# Patient Record
Sex: Female | Born: 1955 | Race: White | Hispanic: No | Marital: Married | Smoking: Never smoker
Health system: Southern US, Community
[De-identification: ages and names within clinical notes are randomized; demographics above are authoritative.]

## PROBLEM LIST (undated history)

## (undated) DIAGNOSIS — K449 Diaphragmatic hernia without obstruction or gangrene: Secondary | ICD-10-CM

## (undated) DIAGNOSIS — F411 Generalized anxiety disorder: Secondary | ICD-10-CM

## (undated) DIAGNOSIS — K219 Gastro-esophageal reflux disease without esophagitis: Secondary | ICD-10-CM

## (undated) DIAGNOSIS — I480 Paroxysmal atrial fibrillation: Secondary | ICD-10-CM

## (undated) DIAGNOSIS — N840 Polyp of corpus uteri: Secondary | ICD-10-CM

## (undated) DIAGNOSIS — Z8719 Personal history of other diseases of the digestive system: Secondary | ICD-10-CM

## (undated) DIAGNOSIS — I10 Essential (primary) hypertension: Secondary | ICD-10-CM

## (undated) DIAGNOSIS — E559 Vitamin D deficiency, unspecified: Secondary | ICD-10-CM

## (undated) DIAGNOSIS — R7303 Prediabetes: Secondary | ICD-10-CM

## (undated) DIAGNOSIS — Z9989 Dependence on other enabling machines and devices: Secondary | ICD-10-CM

## (undated) DIAGNOSIS — G4730 Sleep apnea, unspecified: Secondary | ICD-10-CM

## (undated) DIAGNOSIS — T7840XA Allergy, unspecified, initial encounter: Secondary | ICD-10-CM

## (undated) DIAGNOSIS — J309 Allergic rhinitis, unspecified: Secondary | ICD-10-CM

## (undated) DIAGNOSIS — I499 Cardiac arrhythmia, unspecified: Secondary | ICD-10-CM

## (undated) DIAGNOSIS — G932 Benign intracranial hypertension: Secondary | ICD-10-CM

## (undated) DIAGNOSIS — R7301 Impaired fasting glucose: Secondary | ICD-10-CM

## (undated) DIAGNOSIS — K760 Fatty (change of) liver, not elsewhere classified: Secondary | ICD-10-CM

## (undated) DIAGNOSIS — Z7901 Long term (current) use of anticoagulants: Secondary | ICD-10-CM

## (undated) DIAGNOSIS — R112 Nausea with vomiting, unspecified: Secondary | ICD-10-CM

## (undated) DIAGNOSIS — E041 Nontoxic single thyroid nodule: Secondary | ICD-10-CM

## (undated) DIAGNOSIS — J189 Pneumonia, unspecified organism: Secondary | ICD-10-CM

## (undated) DIAGNOSIS — G4733 Obstructive sleep apnea (adult) (pediatric): Secondary | ICD-10-CM

## (undated) DIAGNOSIS — Z9889 Other specified postprocedural states: Secondary | ICD-10-CM

## (undated) DIAGNOSIS — K8050 Calculus of bile duct without cholangitis or cholecystitis without obstruction: Secondary | ICD-10-CM

## (undated) DIAGNOSIS — Z9289 Personal history of other medical treatment: Secondary | ICD-10-CM

## (undated) HISTORY — PX: UPPER GASTROINTESTINAL ENDOSCOPY: SHX188

## (undated) HISTORY — PX: COLONOSCOPY: SHX174

## (undated) HISTORY — DX: Paroxysmal atrial fibrillation: I48.0

## (undated) HISTORY — DX: Sleep apnea, unspecified: G47.30

## (undated) HISTORY — DX: Allergy, unspecified, initial encounter: T78.40XA

## (undated) HISTORY — DX: Gastro-esophageal reflux disease without esophagitis: K21.9

## (undated) HISTORY — PX: TUBAL LIGATION: SHX77

## (undated) HISTORY — PX: OTHER SURGICAL HISTORY: SHX169

## (undated) HISTORY — PX: FRACTURE SURGERY: SHX138

## (undated) HISTORY — DX: Fatty (change of) liver, not elsewhere classified: K76.0

## (undated) HISTORY — PX: SPINE SURGERY: SHX786

## (undated) HISTORY — DX: Diaphragmatic hernia without obstruction or gangrene: K44.9

## (undated) HISTORY — DX: Benign intracranial hypertension: G93.2

## (undated) HISTORY — DX: Generalized anxiety disorder: F41.1

## (undated) HISTORY — DX: Vitamin D deficiency, unspecified: E55.9

## (undated) HISTORY — DX: Essential (primary) hypertension: I10

## (undated) HISTORY — PX: ABDOMINAL HYSTERECTOMY: SHX81

---

## 1998-04-04 ENCOUNTER — Other Ambulatory Visit: Admission: RE | Admit: 1998-04-04 | Discharge: 1998-04-04 | Payer: Self-pay | Admitting: Obstetrics & Gynecology

## 1999-03-04 ENCOUNTER — Ambulatory Visit (HOSPITAL_COMMUNITY): Admission: RE | Admit: 1999-03-04 | Discharge: 1999-03-04 | Payer: Self-pay | Admitting: Obstetrics and Gynecology

## 1999-03-04 ENCOUNTER — Encounter: Payer: Self-pay | Admitting: Obstetrics and Gynecology

## 1999-05-10 ENCOUNTER — Other Ambulatory Visit: Admission: RE | Admit: 1999-05-10 | Discharge: 1999-05-10 | Payer: Self-pay | Admitting: Obstetrics and Gynecology

## 2000-05-11 ENCOUNTER — Other Ambulatory Visit: Admission: RE | Admit: 2000-05-11 | Discharge: 2000-05-11 | Payer: Self-pay | Admitting: Obstetrics and Gynecology

## 2000-05-21 ENCOUNTER — Encounter: Admission: RE | Admit: 2000-05-21 | Discharge: 2000-05-21 | Payer: Self-pay | Admitting: Obstetrics and Gynecology

## 2000-05-21 ENCOUNTER — Encounter: Payer: Self-pay | Admitting: Obstetrics and Gynecology

## 2001-07-02 ENCOUNTER — Other Ambulatory Visit: Admission: RE | Admit: 2001-07-02 | Discharge: 2001-07-02 | Payer: Self-pay | Admitting: Obstetrics and Gynecology

## 2002-06-29 ENCOUNTER — Encounter: Payer: Self-pay | Admitting: Obstetrics and Gynecology

## 2002-06-29 ENCOUNTER — Encounter: Admission: RE | Admit: 2002-06-29 | Discharge: 2002-06-29 | Payer: Self-pay | Admitting: Obstetrics and Gynecology

## 2002-10-18 ENCOUNTER — Other Ambulatory Visit: Admission: RE | Admit: 2002-10-18 | Discharge: 2002-10-18 | Payer: Self-pay | Admitting: Obstetrics and Gynecology

## 2003-10-25 ENCOUNTER — Encounter: Admission: RE | Admit: 2003-10-25 | Discharge: 2003-10-25 | Payer: Self-pay | Admitting: Obstetrics and Gynecology

## 2003-10-26 ENCOUNTER — Other Ambulatory Visit: Admission: RE | Admit: 2003-10-26 | Discharge: 2003-10-26 | Payer: Self-pay | Admitting: Obstetrics and Gynecology

## 2004-07-08 ENCOUNTER — Ambulatory Visit: Payer: Self-pay | Admitting: Endocrinology

## 2004-07-10 DIAGNOSIS — K219 Gastro-esophageal reflux disease without esophagitis: Secondary | ICD-10-CM

## 2004-07-10 HISTORY — DX: Gastro-esophageal reflux disease without esophagitis: K21.9

## 2004-07-12 ENCOUNTER — Ambulatory Visit: Payer: Self-pay | Admitting: Internal Medicine

## 2004-07-12 ENCOUNTER — Ambulatory Visit (HOSPITAL_COMMUNITY): Admission: RE | Admit: 2004-07-12 | Discharge: 2004-07-12 | Payer: Self-pay | Admitting: Internal Medicine

## 2004-10-28 ENCOUNTER — Encounter: Admission: RE | Admit: 2004-10-28 | Discharge: 2004-10-28 | Payer: Self-pay | Admitting: Obstetrics and Gynecology

## 2005-01-23 ENCOUNTER — Other Ambulatory Visit: Admission: RE | Admit: 2005-01-23 | Discharge: 2005-01-23 | Payer: Self-pay | Admitting: Obstetrics and Gynecology

## 2005-05-12 DIAGNOSIS — G932 Benign intracranial hypertension: Secondary | ICD-10-CM

## 2005-05-12 HISTORY — PX: OTHER SURGICAL HISTORY: SHX169

## 2005-05-12 HISTORY — DX: Benign intracranial hypertension: G93.2

## 2005-10-30 ENCOUNTER — Encounter: Admission: RE | Admit: 2005-10-30 | Discharge: 2005-10-30 | Payer: Self-pay | Admitting: Obstetrics and Gynecology

## 2005-11-11 ENCOUNTER — Encounter: Admission: RE | Admit: 2005-11-11 | Discharge: 2005-11-11 | Payer: Self-pay | Admitting: Obstetrics and Gynecology

## 2006-02-26 ENCOUNTER — Other Ambulatory Visit: Admission: RE | Admit: 2006-02-26 | Discharge: 2006-02-26 | Payer: Self-pay | Admitting: Obstetrics and Gynecology

## 2007-06-21 ENCOUNTER — Ambulatory Visit: Payer: Self-pay | Admitting: Internal Medicine

## 2007-06-23 ENCOUNTER — Encounter: Admission: RE | Admit: 2007-06-23 | Discharge: 2007-06-23 | Payer: Self-pay | Admitting: Internal Medicine

## 2007-06-23 DIAGNOSIS — K760 Fatty (change of) liver, not elsewhere classified: Secondary | ICD-10-CM

## 2007-06-23 HISTORY — DX: Fatty (change of) liver, not elsewhere classified: K76.0

## 2007-10-22 ENCOUNTER — Encounter: Admission: RE | Admit: 2007-10-22 | Discharge: 2007-10-22 | Payer: Self-pay | Admitting: Obstetrics and Gynecology

## 2008-05-12 HISTORY — PX: DILATION AND CURETTAGE OF UTERUS: SHX78

## 2008-06-30 ENCOUNTER — Ambulatory Visit (HOSPITAL_COMMUNITY): Admission: RE | Admit: 2008-06-30 | Discharge: 2008-06-30 | Payer: Self-pay | Admitting: Obstetrics and Gynecology

## 2008-06-30 ENCOUNTER — Encounter (INDEPENDENT_AMBULATORY_CARE_PROVIDER_SITE_OTHER): Payer: Self-pay | Admitting: Obstetrics and Gynecology

## 2008-09-27 ENCOUNTER — Ambulatory Visit: Payer: Self-pay | Admitting: Internal Medicine

## 2008-10-03 ENCOUNTER — Ambulatory Visit: Payer: Self-pay | Admitting: Family Medicine

## 2008-10-03 DIAGNOSIS — J309 Allergic rhinitis, unspecified: Secondary | ICD-10-CM

## 2008-10-03 LAB — CONVERTED CEMR LAB: Rapid Strep: NEGATIVE

## 2008-10-10 ENCOUNTER — Ambulatory Visit: Payer: Self-pay | Admitting: Internal Medicine

## 2008-10-10 LAB — HM COLONOSCOPY: HM Colonoscopy: NORMAL

## 2008-10-23 ENCOUNTER — Encounter: Admission: RE | Admit: 2008-10-23 | Discharge: 2008-10-23 | Payer: Self-pay | Admitting: Obstetrics and Gynecology

## 2008-11-24 ENCOUNTER — Ambulatory Visit: Payer: Self-pay | Admitting: Family Medicine

## 2008-12-08 ENCOUNTER — Encounter: Payer: Self-pay | Admitting: Family Medicine

## 2008-12-08 ENCOUNTER — Encounter: Payer: Self-pay | Admitting: Internal Medicine

## 2009-02-12 ENCOUNTER — Telehealth (INDEPENDENT_AMBULATORY_CARE_PROVIDER_SITE_OTHER): Payer: Self-pay | Admitting: *Deleted

## 2009-02-15 ENCOUNTER — Telehealth (INDEPENDENT_AMBULATORY_CARE_PROVIDER_SITE_OTHER): Payer: Self-pay | Admitting: *Deleted

## 2009-03-06 ENCOUNTER — Ambulatory Visit: Payer: Self-pay | Admitting: Internal Medicine

## 2009-03-06 DIAGNOSIS — J0100 Acute maxillary sinusitis, unspecified: Secondary | ICD-10-CM

## 2009-04-02 ENCOUNTER — Telehealth: Payer: Self-pay | Admitting: Family

## 2009-08-30 ENCOUNTER — Telehealth: Payer: Self-pay | Admitting: Family

## 2009-09-11 ENCOUNTER — Telehealth: Payer: Self-pay | Admitting: Family

## 2009-10-05 ENCOUNTER — Ambulatory Visit: Payer: Self-pay | Admitting: Internal Medicine

## 2009-10-05 DIAGNOSIS — F419 Anxiety disorder, unspecified: Secondary | ICD-10-CM | POA: Insufficient documentation

## 2009-10-05 DIAGNOSIS — F411 Generalized anxiety disorder: Secondary | ICD-10-CM

## 2009-10-05 DIAGNOSIS — K219 Gastro-esophageal reflux disease without esophagitis: Secondary | ICD-10-CM

## 2009-10-10 LAB — HM MAMMOGRAPHY

## 2009-10-24 ENCOUNTER — Encounter: Admission: RE | Admit: 2009-10-24 | Discharge: 2009-10-24 | Payer: Self-pay | Admitting: Obstetrics and Gynecology

## 2009-12-10 ENCOUNTER — Ambulatory Visit: Payer: Self-pay | Admitting: Internal Medicine

## 2009-12-10 DIAGNOSIS — R7301 Impaired fasting glucose: Secondary | ICD-10-CM

## 2009-12-10 DIAGNOSIS — R7303 Prediabetes: Secondary | ICD-10-CM | POA: Insufficient documentation

## 2009-12-10 LAB — CONVERTED CEMR LAB
ALT: 30 units/L (ref 0–35)
Basophils Absolute: 0 10*3/uL (ref 0.0–0.1)
CO2: 30 meq/L (ref 19–32)
Chloride: 106 meq/L (ref 96–112)
Cholesterol: 179 mg/dL (ref 0–200)
Eosinophils Relative: 4.1 % (ref 0.0–5.0)
GFR calc non Af Amer: 80.97 mL/min (ref >60)
Hemoglobin, Urine: NEGATIVE
Hemoglobin: 14.3 g/dL (ref 12.0–15.0)
Ketones, ur: NEGATIVE mg/dL
LDL Cholesterol: 124 mg/dL — ABNORMAL HIGH (ref 0–99)
Leukocytes, UA: NEGATIVE
MCHC: 34.8 g/dL (ref 30.0–36.0)
MCV: 83.7 fL (ref 78.0–100.0)
Monocytes Absolute: 0.6 10*3/uL (ref 0.1–1.0)
Neutrophils Relative %: 59 % (ref 43.0–77.0)
Potassium: 4.7 meq/L (ref 3.5–5.1)
TSH: 1.28 microintl units/mL (ref 0.35–5.50)
Total Bilirubin: 0.9 mg/dL (ref 0.3–1.2)
Total CHOL/HDL Ratio: 4
Total Protein, Urine: NEGATIVE mg/dL
VLDL: 14.2 mg/dL (ref 0.0–40.0)
WBC: 6 10*3/uL (ref 4.5–10.5)

## 2009-12-14 ENCOUNTER — Encounter: Payer: Self-pay | Admitting: Internal Medicine

## 2009-12-14 ENCOUNTER — Ambulatory Visit: Payer: Self-pay | Admitting: Internal Medicine

## 2010-06-02 ENCOUNTER — Encounter: Payer: Self-pay | Admitting: Obstetrics and Gynecology

## 2010-06-03 ENCOUNTER — Encounter: Payer: Self-pay | Admitting: Obstetrics and Gynecology

## 2010-06-11 NOTE — Progress Notes (Signed)
Summary: Aciphex samples  Phone Note Call from Patient Call back at Work Phone [phone]   Caller: Patient Call For: Lemont Fillers FNP Summary of Call: patient called and left voice message stating she was given a rx for Nexium and was given a powder mix pack that is not working for her. She is requesting samples of Nexium to get her through to the next month or would like to know if there is something esle she could do Initial call taken by: Glendell Docker CMA,  Sep 11, 2009 9:56 AM  Follow-up for Phone Call        Patient has not been seen since October.  She needs to schedule OV for evaluation of her GI complaints please. Follow-up by: Lemont Fillers FNP,  Sep 11, 2009 10:02 AM  Additional Follow-up for Phone Call Additional follow up Details #1::        Apparently the Nexium pack/mix was called in instead of capsules. Pt. says the mix is slimmey and she can't get it down.  The pharmacy will not take the med back. Can we give her samples of Prilosec OTC or Aciphex since we do not have any Nexium samples? Pt states she will have to call back to schedule an appt. and voices understanding that we will not be able to give refills on meds until she is seen.  Please advise.   Mervin Kung CMA  Sep 11, 2009 10:25 AM     Additional Follow-up for Phone Call Additional follow up Details #2::    I will leave samples for her (aciphex 20mg  by mouth daily) #30 lot [id number] exp 2012.  If no improvement however, she needs to be seen. Follow-up by: Lemont Fillers FNP,  Sep 11, 2009 10:58 AM  Additional Follow-up for Phone Call Additional follow up Details #3:: Details for Additional Follow-up Action Taken: Pt advised she could pick up samples of Aciphex. Pt states she is unable to get by during the hours of 8-5pm.  I called to the ER and they said we could leave samples at the registration desk for pt. Pt advised she can pick samples up in the ER.  Mervin Kung CMA  Sep 11, 2009 11:36 AM   New/Updated Medications: ACIPHEX 20 MG TBEC (RABEPRAZOLE SODIUM) one tablet by mouth daily

## 2010-06-11 NOTE — Progress Notes (Signed)
Summary: Nexium Refill  Phone Note Refill Request Message from:  Patient on August 30, 2009 8:53 AM  Refills Requested: Medication #1:  NEXIUM 40 MG PACK 1 by mouth daily  Method Requested: Electronic Initial call taken by: Lannette Donath,  August 30, 2009 8:54 AM  Follow-up for Phone Call        OK to give refill. It looks like patient is due for CPX.  Pls call patient and arrange.   Follow-up by: Lemont Fillers FNP,  August 30, 2009 11:31 AM  Additional Follow-up for Phone Call Additional follow up Details #1::        call placed to patient at [phone], no answer, detailed voice message left for patient informing her CPX due, please  call to schedule Additional Follow-up by: Glendell Docker CMA,  August 30, 2009 11:37 AM    Prescriptions: NEXIUM 40 MG PACK (ESOMEPRAZOLE MAGNESIUM) 1 by mouth daily  #30 x 2   Entered by:   Glendell Docker CMA   Authorized by:   Lemont Fillers FNP   Signed by:   Glendell Docker CMA on 08/30/2009   Method used:   Electronically to        [facility]. [phone]* (retail)       [address]       [place], [place]  [address]       Ph: [phone] or [phone]       Fax: [phone]   RxID:   [id number]

## 2010-06-11 NOTE — Assessment & Plan Note (Signed)
Summary: NEW/ [employer]/ TRANSFER FROM MELISSA O'SULLIVAN/NWS  #   Vital Signs:  Patient profile:   55 year old female Height:      66 inches (167.64 cm) Weight:      241.12 pounds (109.60 kg) O2 Sat:      97 % on Room air Temp:     97.8 degrees F (36.56 degrees C) oral Pulse rate:   75 / minute BP sitting:   110 / 92  (left arm) Cuff size:   large  Vitals Entered By: Orlan Leavens (Oct 05, 2009 3:02 PM)  O2 Flow:  Room air CC: New patient Is Patient Diabetic? No Pain Assessment Patient in pain? no        Primary Care Provider:  Newt Lukes MD  CC:  New patient.  History of Present Illness: new pt to me, transfer from another office due to conveince of office location -   1) GERD - hx food impaction 2006 - takes PPI and reports compliance with ongoing medical treatment. no changes in medication dose or frequency - prefers aciphex to nexium. denies adverse side effects related to current therapy.   2) allergic rhinitis - seasonal, reports compliance with ongoing medical treatment and no changes in medication dose or frequency. denies adverse side effects related to current therapy.   3) c/o anxiety and "hot flash symptoms" - menoause > 5 yrs -  describes as flushed feeling and nervousness with speaking or public presentations - prior rx for xanax helpful - but expired - no weight loss, bowel changes, palpiations - no postmenopauseal bleeding  Preventive Screening-Counseling & Management  Alcohol-Tobacco     Smoking Status: never     Tobacco Counseling: not indicated; no tobacco use  Caffeine-Diet-Exercise     Diet Counseling: to improve diet; diet is suboptimal     Nutrition Referrals: no     Does Patient Exercise: no     Exercise Counseling: not indicated; exercise is adequate     Depression Counseling: not indicated; screening negative for depression  Safety-Violence-Falls     Seat Belt Counseling: not indicated; patient wears seat belts     Helmet Counseling:  not indicated; patient wears helmet when riding bicycle/motocycle     Firearms in the Home: no firearms in the home     Firearm Counseling: not applicable     Smoke Detectors: yes     Smoke Detector Counseling: no     Violence Counseling: not indicated; no violence risk noted     Fall Risk Counseling: not indicated; no significant falls noted  Clinical Review Panels:  Prevention   Last Mammogram:  normal (10/23/2008)   Last Pap Smear:  normal (01/02/2009)   Last Colonoscopy:  Location:  [facility].  (10/10/2008)  Immunizations   Last Flu Vaccine:  Fluvax 3+ (01/23/2009)   Current Medications (verified): 1)  Aciphex 20 Mg Tbec (Rabeprazole Sodium) .... One Tablet By Mouth Daily 2)  One-A-Day Menopause Health  Tabs (Specialty Vitamins Products) .... Take 1 By Mouth Once Daily 3)  Wal-Zyr 10 Mg Tabs (Cetirizine Hcl) .... Take 1 By Mouth Once Daily  Allergies: 1)  ! Sulfa 2)  ! Penicillin 3)  ! Codeine 4)  ! Ceclor 5)  ! Levaquin 6)  ! * Hydrocodone Syrup  Past History:  Past Medical History: GERD -- hx food impaction 07/2004 G2P2, menopause at 48. hx of pseudotumor cerebri in 2001. shingles - 2000 migraines   allergic rhinitis  MD rooster: GI -  [facility] - richardson @ [facility]  Past Surgical History: D&C 2010 BTL 1995    Family History: Family History Breast cancer 1st degree relative <50 (mom)  Social History: Occupation: Insurance account manager, social services Married, lives with spouse 2 daughters -79, 84 Never Smoked Smoking Status:  never Does Patient Exercise:  no  Review of Systems  The patient denies fever, weight loss, chest pain, syncope, headaches, and abdominal pain.    Physical Exam  General:  overweight-appearing.  alert, well-developed, well-nourished, and cooperative to examination.    Eyes:  vision grossly intact; pupils equal, round and reactive to light.  conjunctiva and lids normal.    Lungs:  normal respiratory effort, no  intercostal retractions or use of accessory muscles; normal breath sounds bilaterally - no crackles and no wheezes.    Heart:  normal rate, regular rhythm, no murmur, and no rub. BLE without edema. normal DP pulses and normal cap refill in all 4 extremities    Psych:  Oriented X3, memory intact for recent and remote, normally interactive, good eye contact, mildly anxious appearing, not depressed appearing, and not agitated.      Impression & Recommendations:  Problem # 1:  ANXIETY (ICD-300.00)  start trial low dose snri to help with "hot flash" symptoms and ok to use low dose bz sparingly if needed  rx for each done - f/u same symptoms soon (to schedule for CPX) Her updated medication list for this problem includes:    Venlafaxine Hcl 37.5 Mg Xr24h-cap (Venlafaxine hcl) .Marland Kitchen... 1 by mouth every day    Alprazolam 0.5 Mg Tabs (Alprazolam) .Marland Kitchen... 1 by mouth every 12hours as needed for anxiety  Discussed medication use and relaxation techniques.   Orders: Prescription Created Electronically [phone])  Problem # 2:  GERD (ICD-530.81) hx food impaction, daily symptoms improved with aciphex>nexium - cont same Her updated medication list for this problem includes:    Aciphex 20 Mg Tbec (Rabeprazole sodium) ..... One tablet by mouth daily  EGD: Location: [facility]  Findings: Normal   (07/12/2004)  Problem # 3:  ALLERGIC RHINITIS (ICD-477.9) Assessment: Improved  The following medications were removed from the medication list:    Nasonex 50 Mcg/act Susp (Mometasone furoate) .Marland Kitchen... 2 sprays per nostril daily    Zyrtec Allergy 10 Mg Tabs (Cetirizine hcl) .Marland Kitchen... 1 tab by mouth qpm Her updated medication list for this problem includes:    Wal-zyr 10 Mg Tabs (Cetirizine hcl) .Marland Kitchen... Take 1 by mouth once daily  Discussed use of allergy medications and environmental measures.   Complete Medication List: 1)  Aciphex 20 Mg Tbec (Rabeprazole sodium) .... One tablet by mouth daily 2)   One-a-day Menopause Health Tabs (Specialty vitamins products) .... Take 1 by mouth once daily 3)  Wal-zyr 10 Mg Tabs (Cetirizine hcl) .... Take 1 by mouth once daily 4)  Venlafaxine Hcl 37.5 Mg Xr24h-cap (Venlafaxine hcl) .Marland Kitchen.. 1 by mouth every day 5)  Alprazolam 0.5 Mg Tabs (Alprazolam) .Marland Kitchen.. 1 by mouth every 12hours as needed for anxiety  Patient Instructions: 1)  it was good to see you today. 2)  start generic effexor for anxiety and hot flash symptoms - your prescription has been electronically submitted to your pharmacy. Please take as directed. Contact our office if you believe you're having problems with the medication(s).  3)  generic xanax also provided to use as needed as discussed 4)  Take calcium +Vitamin D daily. 5)  refill on aciphex - 6)  Please schedule a follow-up  appointment for medical physical and labs as discussed, sooner if problems.  Prescriptions: ALPRAZOLAM 0.5 MG TABS (ALPRAZOLAM) 1 by mouth every 12hours as needed for anxiety  #30 x 1   Entered and Authorized by:   Newt Lukes MD   Signed by:   Newt Lukes MD on 10/05/2009   Method used:   Print then Give to Patient   RxID:   [id number] ACIPHEX 20 MG TBEC (RABEPRAZOLE SODIUM) one tablet by mouth daily  #30 x 5   Entered and Authorized by:   Newt Lukes MD   Signed by:   Newt Lukes MD on 10/05/2009   Method used:   Electronically to        [facility]. [phone]* (retail)       [address]       [place], [place]  [address]       Ph: [phone] or [phone]       Fax: [phone]   RxID:   [id number] VENLAFAXINE HCL 37.5 MG XR24H-CAP (VENLAFAXINE HCL) 1 by mouth every day  #30 x 3   Entered and Authorized by:   Newt Lukes MD   Signed by:   Newt Lukes MD on 10/05/2009   Method used:   Electronically to        [facility]. [phone]* (retail)       [address]       [place], [place]  [address]       Ph: [phone] or [phone]       Fax: [phone]   RxID:   [id number]    EGD  Procedure date:  07/12/2004  Findings:      Location: [facility]  Findings: Normal

## 2010-06-11 NOTE — Assessment & Plan Note (Signed)
Summary: CPX / NWS / #   Vital Signs:  Patient profile:   55 year old female Height:      66 inches (167.64 cm) Weight:      242.4 pounds (110.18 kg) BMI:     39.27 O2 Sat:      97 % on Room air Temp:     97.9 degrees F (36.61 degrees C) oral Pulse rate:   63 / minute BP sitting:   134 / 92  (left arm) Cuff size:   large  Vitals Entered By: Orlan Leavens RMA (December 14, 2009 9:42 AM)  O2 Flow:  Room air CC: CPX Is Patient Diabetic? No Pain Assessment Patient in pain? no        Primary Care Provider:  Newt Lukes MD  CC:  CPX.  History of Present Illness: patient is here today for annual physical. Patient feels well and has no complaints.   reviewed chronic med issues  1) GERD - hx food impaction 2006 - takes PPI and reports compliance with ongoing medical treatment. no changes in medication dose or frequency - prefers aciphex to nexium. denies adverse side effects related to current therapy.   2) allergic rhinitis - seasonal, reports compliance with ongoing medical treatment and no changes in medication dose or frequency. denies adverse side effects related to current therapy.  increase in sinus pressure with weather changes - but no discharge, pain or fever  3) c/o anxiety and "hot flash symptoms" - menoause > 5 yrs -  describes as flushed feeling and nervousness with speaking or public presentations - rx for xanax helpful (not needing to take it, just "know that it is there" -started venlafax 09/2009 - seems improved but "not all better yet"  Preventive Screening-Counseling & Management  Alcohol-Tobacco     Smoking Status: never     Tobacco Counseling: not indicated; no tobacco use  Caffeine-Diet-Exercise     Diet Counseling: to improve diet; diet is suboptimal     Nutrition Referrals: no     Does Patient Exercise: no     Exercise Counseling: not indicated; exercise is adequate     Depression Counseling: not indicated; screening negative for  depression  Safety-Violence-Falls     Seat Belt Counseling: not indicated; patient wears seat belts     Helmet Counseling: not indicated; patient wears helmet when riding bicycle/motocycle     Firearms in the Home: no firearms in the home     Firearm Counseling: not applicable     Smoke Detectors: yes     Smoke Detector Counseling: no     Violence Counseling: not indicated; no violence risk noted     Fall Risk Counseling: not indicated; no significant falls noted  Clinical Review Panels:  Prevention   Last Mammogram:  No specific mammographic evidence of malignancy.   (10/10/2009)   Last Pap Smear:  normal (01/02/2009)   Last Colonoscopy:  Location:  [facility].  (10/10/2008)  Immunizations   Last Tetanus Booster:  Tdap (12/14/2009)   Last Flu Vaccine:  Fluvax 3+ (01/23/2009)  Lipid Management   Cholesterol:  179 (12/10/2009)   LDL (bad choesterol):  124 (12/10/2009)   HDL (good cholesterol):  40.40 (12/10/2009)  CBC   WBC:  6.0 (12/10/2009)   RBC:  4.92 (12/10/2009)   Hgb:  14.3 (12/10/2009)   Hct:  41.2 (12/10/2009)   Platelets:  258.0 (12/10/2009)   MCV  83.7 (12/10/2009)   MCHC  34.8 (12/10/2009)   RDW  14.1 (12/10/2009)   PMN:  59.0 (12/10/2009)   Lymphs:  26.4 (12/10/2009)   Monos:  9.7 (12/10/2009)   Eosinophils:  4.1 (12/10/2009)   Basophil:  0.8 (12/10/2009)  Complete Metabolic Panel   Glucose:  114 (12/10/2009)   Sodium:  141 (12/10/2009)   Potassium:  4.7 (12/10/2009)   Chloride:  106 (12/10/2009)   CO2:  30 (12/10/2009)   BUN:  17 (12/10/2009)   Creatinine:  0.8 (12/10/2009)   Albumin:  3.7 (12/10/2009)   Total Protein:  6.8 (12/10/2009)   Calcium:  9.3 (12/10/2009)   Total Bili:  0.9 (12/10/2009)   Alk Phos:  71 (12/10/2009)   SGPT (ALT):  30 (12/10/2009)   SGOT (AST):  20 (12/10/2009)   Current Medications (verified): 1)  Aciphex 20 Mg Tbec (Rabeprazole Sodium) .... One Tablet By Mouth Daily 2)  One-A-Day Menopause Health   Tabs (Specialty Vitamins Products) .... Take 1 By Mouth Once Daily 3)  Wal-Zyr 10 Mg Tabs (Cetirizine Hcl) .... Take 1 By Mouth Once Daily 4)  Venlafaxine Hcl 37.5 Mg Xr24h-Cap (Venlafaxine Hcl) .Marland Kitchen.. 1 By Mouth Every Day 5)  Alprazolam 0.5 Mg Tabs (Alprazolam) .Marland Kitchen.. 1 By Mouth Every 12hours As Needed For Anxiety  Allergies (verified): 1)  ! Sulfa 2)  ! Penicillin 3)  ! Codeine 4)  ! Ceclor 5)  ! Levaquin 6)  ! * Hydrocodone Syrup  Past History:  Past Medical History: GERD -- hx food impaction 07/2004 G2P2, menopause at 48. hx of pseudotumor cerebri in 2001. shingles - 2000 migraines   allergic rhinitis  MD roster: GI - brodie gyn - richardson @ [facility]  Past Surgical History: D&C 2010 BTL 1995  Family History: Family History Breast cancer 1st degree relative <50 (mom)  Social History: Occupation: Insurance account manager, social services Married, lives with spouse 2 daughters -71, 58 Never Smoked  Review of Systems       see HPI above. I have reviewed all other systems and they were negative.   Physical Exam  General:  overweight-appearing.  alert, well-developed, well-nourished, and cooperative to examination.    Eyes:  vision grossly intact; pupils equal, round and reactive to light.  conjunctiva and lids normal.    Ears:  scarring on both TMs R>L,normal pinnae bilaterally, without erythema, swelling, or tenderness to palpation. TMs with clear effusion, no cerumen impaction. Hearing grossly normal bilaterally  Mouth:  2+ tonsills without erythema or exudate -good dentition and pharynx pink and moist.   Neck:  supple, full ROM, no masses, no thyromegaly; no thyroid nodules or tenderness. no JVD or carotid bruits.   Lungs:  normal respiratory effort, no intercostal retractions or use of accessory muscles; normal breath sounds bilaterally - no crackles and no wheezes.    Heart:  normal rate, regular rhythm, no murmur, and no rub. BLE without edema. normal DP pulses and normal cap  refill in all 4 extremities    Abdomen:  soft, non-tender, normal bowel sounds, no distention; no masses and no appreciable hepatomegaly or splenomegaly.   Genitalia:  defer to gyn Msk:  No deformity or scoliosis noted of thoracic or lumbar spine.   Neurologic:  alert & oriented X3 and cranial nerves II-XII symetrically intact.  strength normal in all extremities, sensation intact to light touch, and gait normal. speech fluent without dysarthria or aphasia; follows commands with good comprehension.  Skin:  no rashes, vesicles, ulcers, or erythema. No nodules or irregularity to palpation.  Psych:  Oriented X3, memory intact for recent  and remote, normally interactive, good eye contact, not anxious appearing, not depressed appearing, and not agitated.      Impression & Recommendations:  Problem # 1:  PHYSICAL EXAMINATION (ICD-V70.0) Patient has been counseled on age-appropriate routine health concerns for screening and prevention. These are reviewed and up-to-date. Immunizations are up-to-date or declined. Labs and ECG reviewed.  Orders: EKG w/ Interpretation (93000)  Problem # 2:  ALLERGIC RHINITIS (ICD-477.9)  add nasal steroid for sinus pressure - hope to prevent recurrent sinus infx this fall educated on use of nasal steroid and mech of action Her updated medication list for this problem includes:    Wal-zyr 10 Mg Tabs (Cetirizine hcl) .Marland Kitchen... Take 1 by mouth once daily    Veramyst 27.5 Mcg/spray Susp (Fluticasone furoate) .Marland Kitchen... 1 spray each nostril  every morning  Orders: Prescription Created Electronically [phone])  Problem # 3:  ANXIETY (ICD-300.00)  Her updated medication list for this problem includes:    Venlafaxine Hcl 75 Mg Xr24h-cap (Venlafaxine hcl) .Marland Kitchen... 1 by mouth once daily    Alprazolam 0.5 Mg Tabs (Alprazolam) .Marland Kitchen... 1 by mouth every 12hours as needed for anxiety  symptoms improved on low dose snri to help with "hot flash" symptoms and ok to use low dose bz sparingly if  needed  titrate up dose now for inc effect on symptoms control -  fup 6 mnths to reasses need for med and poss titrate off, pt to call sooner if problems  Orders: Prescription Created Electronically [phone])  Complete Medication List: 1)  Aciphex 20 Mg Tbec (Rabeprazole sodium) .... One tablet by mouth daily 2)  One-a-day Menopause Health Tabs (Specialty vitamins products) .... Take 1 by mouth once daily 3)  Wal-zyr 10 Mg Tabs (Cetirizine hcl) .... Take 1 by mouth once daily 4)  Venlafaxine Hcl 75 Mg Xr24h-cap (Venlafaxine hcl) .Marland Kitchen.. 1 by mouth once daily 5)  Alprazolam 0.5 Mg Tabs (Alprazolam) .Marland Kitchen.. 1 by mouth every 12hours as needed for anxiety 6)  Veramyst 27.5 Mcg/spray Susp (Fluticasone furoate) .Marland Kitchen.. 1 spray each nostril  every morning  Other Orders: Tdap => 74yrs IM (09811) Admin 1st Vaccine (91478)  Patient Instructions: 1)  it was good to see you today. 2)  exam and EKG look great - labs reviewed (also look good!) 3)  increase dose venlafaxine as discussed 4)  start nasal steroid spray for sinus and allergy symptoms - veramyst 5)  your prescriptions have been electronically submitted to your pharmacy. Please take as directed. Contact our office if you believe you're having problems with the medication(s).  6)  Please schedule a follow-up appointment in 6 months to review anxiety and other issues, call sooner if problems.  Prescriptions: VERAMYST 27.5 MCG/SPRAY SUSP (FLUTICASONE FUROATE) 1 spray each nostril  every morning  #1 x 5   Entered and Authorized by:   Newt Lukes MD   Signed by:   Newt Lukes MD on 12/14/2009   Method used:   Electronically to        [facility]. [phone]* (retail)       [address]       [place], [place]  [address]       Ph: [phone] or [phone]       Fax: [phone]   RxID:   [id number] VENLAFAXINE HCL 75 MG XR24H-CAP (VENLAFAXINE HCL) 1 by mouth once daily  #30 x 6   Entered and  Authorized by:   Vikki Ports  Edsel Petrin MD   Signed by:   Newt Lukes MD on 12/14/2009   Method used:   Electronically to        [facility]. [phone]* (retail)       [address]       [place], [place]  [address]       Ph: [phone] or [phone]       Fax: [phone]   RxID:   [id number]    Mammogram  Procedure date:  10/10/2009  Findings:      No specific mammographic evidence of malignancy.      Immunizations Administered:  Tetanus Vaccine:    Vaccine Type: Tdap    Site: left deltoid    Mfr: Gl[employer]oSmithKline    Dose: 0.5 ml    Route: IM    Given by: Orlan Leavens RMA    Exp. Date: 11/09/2011    Lot #: [id number]    VIS given: 12/14/09

## 2010-06-21 ENCOUNTER — Encounter: Payer: Self-pay | Admitting: Internal Medicine

## 2010-06-21 ENCOUNTER — Ambulatory Visit (INDEPENDENT_AMBULATORY_CARE_PROVIDER_SITE_OTHER): Payer: Managed Care, Other (non HMO) | Admitting: Internal Medicine

## 2010-06-21 DIAGNOSIS — I10 Essential (primary) hypertension: Secondary | ICD-10-CM | POA: Insufficient documentation

## 2010-06-21 DIAGNOSIS — F411 Generalized anxiety disorder: Secondary | ICD-10-CM

## 2010-06-21 DIAGNOSIS — J0100 Acute maxillary sinusitis, unspecified: Secondary | ICD-10-CM

## 2010-06-27 NOTE — Assessment & Plan Note (Signed)
Summary: 6 MTH ROV NWS #   Vital Signs:  Patient profile:   55 year old female Height:      66 inches (167.64 cm) Weight:      243.4 pounds (110.64 kg) O2 Sat:      97 % on Room air Temp:     98.5 degrees F (36.94 degrees C) oral Pulse rate:   651 / minute BP sitting:   140 / 88  (left arm) Cuff size:   large  Vitals Entered By: Orlan Leavens RMA (June 21, 2010 4:31 PM)  O2 Flow:  Room air CC: 6 month follow-up Is Patient Diabetic? No Pain Assessment Patient in pain? no      Comments ? sinus infection also, and want to discuss BP she states by her machine been running higher   Primary Care Provider:  Newt Lukes MD  CC:  6 month follow-up.  History of Present Illness: here for f/u -  1) GERD - hx food impaction 2006 - takes PPI and reports compliance with ongoing medical treatment. no changes in medication dose or frequency - prefers aciphex to nexium. denies adverse side effects related to current therapy.   2) allergic rhinitis - seasonal, reports compliance with ongoing medical treatment and no changes in medication dose or frequency. denies adverse side effects related to current therapy.  increase in sinus pressure with weather changes - located over left max region with nasal discharge, mild pain but no fever  3) c/o anxiety and "hot flash symptoms" - menopause > 5 yrs -  describes as flushed feeling and nervousness with speaking or public presentations - rx for xanax helpful (not needing to take it, just "know that it is there" -started venlafax 09/2009, inc dose 12/2009 - seems improved  4) elev BP - never on meds for HTN - FH same - no HA or edema - will to start med tx  Clinical Review Panels:  Immunizations   Last Tetanus Booster:  Tdap (12/14/2009)   Last Flu Vaccine:  Fluvax 3+ (01/23/2009)  Lipid Management   Cholesterol:  179 (12/10/2009)   LDL (bad choesterol):  124 (12/10/2009)   HDL (good cholesterol):  40.40 (12/10/2009)  CBC   WBC:  6.0  (12/10/2009)   RBC:  4.92 (12/10/2009)   Hgb:  14.3 (12/10/2009)   Hct:  41.2 (12/10/2009)   Platelets:  258.0 (12/10/2009)   MCV  83.7 (12/10/2009)   MCHC  34.8 (12/10/2009)   RDW  14.1 (12/10/2009)   PMN:  59.0 (12/10/2009)   Lymphs:  26.4 (12/10/2009)   Monos:  9.7 (12/10/2009)   Eosinophils:  4.1 (12/10/2009)   Basophil:  0.8 (12/10/2009)  Complete Metabolic Panel   Glucose:  114 (12/10/2009)   Sodium:  141 (12/10/2009)   Potassium:  4.7 (12/10/2009)   Chloride:  106 (12/10/2009)   CO2:  30 (12/10/2009)   BUN:  17 (12/10/2009)   Creatinine:  0.8 (12/10/2009)   Albumin:  3.7 (12/10/2009)   Total Protein:  6.8 (12/10/2009)   Calcium:  9.3 (12/10/2009)   Total Bili:  0.9 (12/10/2009)   Alk Phos:  71 (12/10/2009)   SGPT (ALT):  30 (12/10/2009)   SGOT (AST):  20 (12/10/2009)   Current Medications (verified): 1)  Aciphex 20 Mg Tbec (Rabeprazole Sodium) .... One Tablet By Mouth Daily 2)  One-A-Day Menopause Health  Tabs (Specialty Vitamins Products) .... Take 1 By Mouth Once Daily 3)  Wal-Zyr 10 Mg Tabs (Cetirizine Hcl) .... Take 1 By Mouth  Once Daily 4)  Venlafaxine Hcl 75 Mg Xr24h-Cap (Venlafaxine Hcl) .Marland Kitchen.. 1 By Mouth Once Daily 5)  Alprazolam 0.5 Mg Tabs (Alprazolam) .Marland Kitchen.. 1 By Mouth Every 12hours As Needed For Anxiety 6)  Veramyst 27.5 Mcg/spray Susp (Fluticasone Furoate) .Marland Kitchen.. 1 Spray Each Nostril  Every Morning  Allergies (verified): 1)  ! Sulfa 2)  ! Penicillin 3)  ! Codeine 4)  ! Ceclor 5)  ! Levaquin 6)  ! * Hydrocodone Syrup  Past History:  Past Medical History: GERD -- hx food impaction 07/2004 G2P2, menopause at 48. hx of pseudotumor cerebri in 2001. shingles - 2000 migraines    allergic rhinitis hypertension  MD roster: GI - [facility] - richardson @ [facility]  Review of Systems  The patient denies anorexia, weight loss, chest pain, headaches, and abdominal pain.    Physical Exam  General:  overweight-appearing.  alert, well-developed,  well-nourished, and cooperative to examination.    Head:  Normocephalic and atraumatic without obvious abnormalities. No apparent alopecia or balding. Eyes:  vision grossly intact; pupils equal, round and reactive to light.  conjunctiva and lids normal.    Ears:  scarring on both TMs R>L,normal pinnae bilaterally, without erythema, swelling, or tenderness to palpation. TMs with clear effusion, no cerumen impaction. Hearing grossly normal bilaterally  Mouth:  2+ tonsills without erythema or exudate -good dentition and pharynx pink and moist.   Lungs:  normal respiratory effort, no intercostal retractions or use of accessory muscles; normal breath sounds bilaterally - no crackles and no wheezes.    Heart:  normal rate, regular rhythm, no murmur, and no rub. BLE without edema.   Impression & Recommendations:  Problem # 1:  HYPERTENSION (ICD-401.9)  new dx - start ARB now - erx done after d/w pt re: risk/benefit of tx  Her updated medication list for this problem includes:    Diovan 80 Mg Tabs (Valsartan) .Marland Kitchen... 1 by mouth once daily  BP today: 140/88 Prior BP: 134/92 (12/14/2009)  Labs Reviewed: K+: 4.7 (12/10/2009) Creat: : 0.8 (12/10/2009)   Chol: 179 (12/10/2009)   HDL: 40.40 (12/10/2009)   LDL: 124 (12/10/2009)   TG: 71.0 (12/10/2009)  Orders: Prescription Created Electronically [phone])  Problem # 2:  ANXIETY (ICD-300.00)  Her updated medication list for this problem includes:    Venlafaxine Hcl 75 Mg Xr24h-cap (Venlafaxine hcl) .Marland Kitchen... 1 by mouth once daily    Alprazolam 0.5 Mg Tabs (Alprazolam) .Marland Kitchen... 1 by mouth every 12hours as needed for anxiety  symptoms improved on snri to help with "hot flash" symptoms and ok to cont to use low dose bz sparingly if needed  fup 6 months to reasses need for med and poss titrate off, pt to call sooner if problems  Problem # 3:  SINUSITIS, MAXILLARY, ACUTE (ICD-461.0)  Her updated medication list for this problem includes:    Veramyst 27.5  Mcg/spray Susp (Fluticasone furoate) .Marland Kitchen... 1 spray each nostril  every morning    Azithromycin 250 Mg Tabs (Azithromycin) .Marland Kitchen... 2 tabs by mouth today, then 1 by mouth daily starting tomorrow  Instructed on treatment. Call if symptoms persist or worsen.   Orders: Prescription Created Electronically [phone])  Complete Medication List: 1)  Aciphex 20 Mg Tbec (Rabeprazole sodium) .... One tablet by mouth daily 2)  One-a-day Menopause Health Tabs (Specialty vitamins products) .... Take 1 by mouth once daily 3)  Wal-zyr 10 Mg Tabs (Cetirizine hcl) .... Take 1 by mouth once daily 4)  Venlafaxine Hcl 75 Mg Xr24h-cap (Venlafaxine hcl) .Marland KitchenMarland KitchenMarland Kitchen  1 by mouth once daily 5)  Alprazolam 0.5 Mg Tabs (Alprazolam) .Marland Kitchen.. 1 by mouth every 12hours as needed for anxiety 6)  Veramyst 27.5 Mcg/spray Susp (Fluticasone furoate) .Marland Kitchen.. 1 spray each nostril  every morning 7)  Azithromycin 250 Mg Tabs (Azithromycin) .... 2 tabs by mouth today, then 1 by mouth daily starting tomorrow 8)  Diovan 80 Mg Tabs (Valsartan) .Marland Kitchen.. 1 by mouth once daily  Patient Instructions: 1)  it was good to see you today. 2)  start diovan for high blood pressure -  3)  use Zpak for sinus symptoms  4)  your prescriptions and refills have been electronically submitted to your pharmacy. Please take as directed. Contact our office if you believe you're having problems with the medication(s).  5)  Please schedule a follow-up appointment in 6-12 weeks to recheck blood pressure and review medications, call sooner if problems.  Prescriptions: DIOVAN 80 MG TABS (VALSARTAN) 1 by mouth once daily  #30 x 3   Entered and Authorized by:   Newt Lukes MD   Signed by:   Newt Lukes MD on 06/21/2010   Method used:   Electronically to        [facility]. [phone]* (retail)       [address]       [place], [place]  [address]       Ph: [phone] or [phone]       Fax: [phone]   RxID:    [id number] AZITHROMYCIN 250 MG TABS (AZITHROMYCIN) 2 tabs by mouth today, then 1 by mouth daily starting tomorrow  #6 x 0   Entered and Authorized by:   Newt Lukes MD   Signed by:   Newt Lukes MD on 06/21/2010   Method used:   Electronically to        [facility]. [phone]* (retail)       [address]       [place], [place]  [address]       Ph: [phone] or [phone]       Fax: [phone]   RxID:   [id number]    Orders Added: 1)  Est. Patient Level IV [627[address]] 2)  Prescription Created Electronically [phone]

## 2010-06-28 ENCOUNTER — Telehealth: Payer: Self-pay | Admitting: Internal Medicine

## 2010-07-03 NOTE — Progress Notes (Signed)
Summary: elevated BP  Phone Note Call from Patient Call back at Work Phone [phone]   Caller: Patient Summary of Call: Pt called stating that she has BP checked at health screening at work and it was 157/94. Pt is requesting MD advise on if current dosage of Diovan (80) is sufficient? Initial call taken by: Margaret Pyle, CMA,  June 28, 2010 11:13 AM  Follow-up for Phone Call        increase dose Diovan to 160mg  once daily - new erx done - and make OV in 2 weeks to reck BP and cont titration as needed - thx Follow-up by: Newt Lukes MD,  June 28, 2010 1:09 PM  Additional Follow-up for Phone Call Additional follow up Details #1::        Pt advised of same and transferred to sch appt Additional Follow-up by: Margaret Pyle, CMA,  June 28, 2010 1:27 PM    New/Updated Medications: DIOVAN 160 MG TABS (VALSARTAN) 1 by mouth once daily Prescriptions: DIOVAN 160 MG TABS (VALSARTAN) 1 by mouth once daily  #30 x 1   Entered and Authorized by:   Newt Lukes MD   Signed by:   Newt Lukes MD on 06/28/2010   Method used:   Electronically to        [facility]. [phone]* (retail)       [address]       [place], [place]  [address]       Ph: [phone] or [phone]       Fax: [phone]   RxID:   [id number]

## 2010-07-12 ENCOUNTER — Ambulatory Visit (INDEPENDENT_AMBULATORY_CARE_PROVIDER_SITE_OTHER): Payer: Managed Care, Other (non HMO) | Admitting: Internal Medicine

## 2010-07-12 ENCOUNTER — Encounter: Payer: Self-pay | Admitting: Internal Medicine

## 2010-07-12 DIAGNOSIS — J0100 Acute maxillary sinusitis, unspecified: Secondary | ICD-10-CM

## 2010-07-12 DIAGNOSIS — I10 Essential (primary) hypertension: Secondary | ICD-10-CM

## 2010-07-12 DIAGNOSIS — K219 Gastro-esophageal reflux disease without esophagitis: Secondary | ICD-10-CM

## 2010-07-18 NOTE — Assessment & Plan Note (Signed)
Summary: FU ON BP /NWS   Vital Signs:  Patient profile:   55 year old female Weight:      242.8 pounds (110.36 kg) O2 Sat:      98 % on Room air Temp:     98.3 degrees F (36.83 degrees C) oral Pulse rate:   69 / minute BP sitting:   128 / 88  (left arm) Cuff size:   large  Vitals Entered By: Orlan Leavens RMA (July 12, 2010 1:22 PM)  O2 Flow:  Room air CC: follow-up visit Is Patient Diabetic? No Pain Assessment Patient in pain? no        Primary Care Provider:  Newt Lukes MD  CC:  follow-up visit.  History of Present Illness: here for f/u -  1) GERD - hx food impaction 2006 - takes PPI and reports compliance with ongoing medical treatment. no changes in medication dose or frequency - prefers aciphex to nexium. denies adverse side effects related to current therapy.   2) allergic rhinitis - seasonal, reports compliance with ongoing medical treatment and no changes in medication dose or frequency. denies adverse side effects related to current therapy.  increase in sinus pressure with weather changes - located over left max region with nasal discharge, mild pain but no fever  3) c/o anxiety and "hot flash symptoms" - menopause > 5 yrs -  describes as flushed feeling and nervousness with speaking or public presentations - rx for xanax helpful (not needing to take it, just "know that it is there" -started venlafax 09/2009, inc dose 12/2009 - seems improved  4) HTN - started arb 06/2010 for same; FH same - no HA or edema - reports compliance with ongoing medical treatment and no changes in medication dose or frequency. denies adverse side effects related to current therapy.   Current Medications (verified): 1)  Aciphex 20 Mg Tbec (Rabeprazole Sodium) .... One Tablet By Mouth Daily 2)  One-A-Day Menopause Health  Tabs (Specialty Vitamins Products) .... Take 1 By Mouth Once Daily 3)  Wal-Zyr 10 Mg Tabs (Cetirizine Hcl) .... Take 1 By Mouth Once Daily 4)  Venlafaxine Hcl 75 Mg  Xr24h-Cap (Venlafaxine Hcl) .Marland Kitchen.. 1 By Mouth Once Daily 5)  Alprazolam 0.5 Mg Tabs (Alprazolam) .Marland Kitchen.. 1 By Mouth Every 12hours As Needed For Anxiety 6)  Veramyst 27.5 Mcg/spray Susp (Fluticasone Furoate) .Marland Kitchen.. 1 Spray Each Nostril  Every Morning 7)  Diovan 160 Mg Tabs (Valsartan) .Marland Kitchen.. 1 By Mouth Once Daily  Allergies (verified): 1)  ! Sulfa 2)  ! Penicillin 3)  ! Codeine 4)  ! Ceclor 5)  ! Levaquin 6)  ! * Hydrocodone Syrup  Past History:  Past Medical History: GERD -- hx food impaction 07/2004 G2P2, menopause at 48. hx of pseudotumor cerebri in 2001. shingles - 2000 migraines    allergic rhinitis hypertension   MD roster: GI - brodie gyn - richardson @ [facility]  Review of Systems  The patient denies chest pain, syncope, and hemoptysis.    Physical Exam  General:  overweight-appearing.  alert, well-developed, well-nourished, and cooperative to examination.    Nose:  small septal irritation ulceration - no drainage Mouth:  2+ tonsills without erythema or exudate -good dentition and pharynx pink and moist.   Lungs:  normal respiratory effort, no intercostal retractions or use of accessory muscles; normal breath sounds bilaterally - no crackles and no wheezes.    Heart:  normal rate, regular rhythm, no murmur, and no rub. BLE without  edema.   Impression & Recommendations:  Problem # 1:  HYPERTENSION (ICD-401.9)  improved on diovan - requests change to generic -  erx done but need to watch to ensure same control Her updated medication list for this problem includes:    Losartan Potassium 100 Mg Tabs (Losartan potassium) .Marland Kitchen... 1 by mouth once daily  BP today: 128/88 Prior BP: 140/88 (06/21/2010)  Labs Reviewed: K+: 4.7 (12/10/2009) Creat: : 0.8 (12/10/2009)   Chol: 179 (12/10/2009)   HDL: 40.40 (12/10/2009)   LDL: 124 (12/10/2009)   TG: 71.0 (12/10/2009)  Orders: Prescription Created Electronically [phone])  Problem # 2:  SINUSITIS, MAXILLARY, ACUTE  (ICD-461.0)  recent infx now with left septal ulceration - rec nasal saline moisturizer and ok to repeat abx given bloody discharge refer ENT if symptoms unimproved Her updated medication list for this problem includes:    Veramyst 27.5 Mcg/spray Susp (Fluticasone furoate) .Marland Kitchen... 1 spray each nostril  every morning    Azithromycin 250 Mg Tabs (Azithromycin) .Marland Kitchen... 2 tabs by mouth today, then 1 by mouth daily starting tomorrow  Orders: Prescription Created Electronically [phone])  Problem # 3:  GERD (ICD-530.81)  change PPI to generic - Her updated medication list for this problem includes:    Pantoprazole Sodium 40 Mg Tbec (Pantoprazole sodium) .Marland Kitchen... 1 by mouth once daily  Orders: Prescription Created Electronically [phone])  hx food impaction, daily symptoms improved with aciphex>nexium - watch with change to pantopr  EGD: Location: [facility]  Findings: Normal   (07/12/2004)  Labs Reviewed: Hgb: 14.3 (12/10/2009)   Hct: 41.2 (12/10/2009)  Complete Medication List: 1)  Pantoprazole Sodium 40 Mg Tbec (Pantoprazole sodium) .Marland Kitchen.. 1 by mouth once daily 2)  One-a-day Menopause Health Tabs (Specialty vitamins products) .... Take 1 by mouth once daily 3)  Wal-zyr 10 Mg Tabs (Cetirizine hcl) .... Take 1 by mouth once daily 4)  Venlafaxine Hcl 75 Mg Xr24h-cap (Venlafaxine hcl) .Marland Kitchen.. 1 by mouth once daily 5)  Alprazolam 0.5 Mg Tabs (Alprazolam) .Marland Kitchen.. 1 by mouth every 12hours as needed for anxiety 6)  Veramyst 27.5 Mcg/spray Susp (Fluticasone furoate) .Marland Kitchen.. 1 spray each nostril  every morning 7)  Losartan Potassium 100 Mg Tabs (Losartan potassium) .Marland Kitchen.. 1 by mouth once daily 8)  Azithromycin 250 Mg Tabs (Azithromycin) .... 2 tabs by mouth today, then 1 by mouth daily starting tomorrow  Patient Instructions: 1)  it was good to see you today. 2)  change diovan to losartan for high blood pressure -  3)  change aciphex to generic protonix for reflux 4)  Zpak as needed  5)  your  prescriptions have been electronically submitted to your pharmacy. Please take as directed. Contact our office if you believe you're having problems with the medication(s).  6)  Please schedule a follow-up appointment in 12 weeks to recheck blood pressure and review medications, call sooner if problems.  7)  Check your Blood Pressure regularly. If it is above:140/85 you should make an appointment. Prescriptions: AZITHROMYCIN 250 MG TABS (AZITHROMYCIN) 2 tabs by mouth today, then 1 by mouth daily starting tomorrow  #6 x 0   Entered and Authorized by:   Newt Lukes MD   Signed by:   Newt Lukes MD on 07/12/2010   Method used:   Electronically to        [facility] [phone]* (retail)       [address]       [place]  [place], [place]  [address]       Ph: [phone] or [phone]       Fax: [phone]   RxID:   [id number] PANTOPRAZOLE SODIUM 40 MG TBEC (PANTOPRAZOLE SODIUM) 1 by mouth once daily  #30 x 3   Entered and Authorized by:   Newt Lukes MD   Signed by:   Newt Lukes MD on 07/12/2010   Method used:   Electronically to        [facility]. [phone]* (retail)       [address]       [place], [place]  [address]       Ph: [phone] or [phone]       Fax: [phone]   RxID:   [id number] LOSARTAN POTASSIUM 100 MG TABS (LOSARTAN POTASSIUM) 1 by mouth once daily  #30 x 3   Entered and Authorized by:   Newt Lukes MD   Signed by:   Newt Lukes MD on 07/12/2010   Method used:   Electronically to        [facility]. [phone]* (retail)       [address]       [place], [place]  [address]       Ph: [phone] or [phone]       Fax: [phone]   RxID:   [id number]    Orders Added: 1)  Est. Patient Level IV [10258] 2)  Prescription Created Electronically [phone]

## 2010-07-19 DIAGNOSIS — G43909 Migraine, unspecified, not intractable, without status migrainosus: Secondary | ICD-10-CM | POA: Insufficient documentation

## 2010-07-19 DIAGNOSIS — B029 Zoster without complications: Secondary | ICD-10-CM | POA: Insufficient documentation

## 2010-07-31 ENCOUNTER — Encounter: Payer: Self-pay | Admitting: *Deleted

## 2010-08-27 LAB — CBC
HCT: 42.7 % (ref 36.0–46.0)
Hemoglobin: 14.3 g/dL (ref 12.0–15.0)
MCHC: 33.5 g/dL (ref 30.0–36.0)
WBC: 5.6 10*3/uL (ref 4.0–10.5)

## 2010-09-06 ENCOUNTER — Ambulatory Visit: Payer: Managed Care, Other (non HMO) | Admitting: Internal Medicine

## 2010-09-24 NOTE — Op Note (Signed)
NAMEMARIELLEN, Shannon Navarro             ACCOUNT NO.:  [id number]   MEDICAL RECORD NO.:  [id number]          PATIENT TYPE:  AMB   LOCATION:  SDC                           FACILITY:  [facility]   PHYSICIAN:  Huel Cote, M.D. DATE OF BIRTH:  10-21-1955   DATE OF PROCEDURE:  DATE OF DISCHARGE:                               OPERATIVE REPORT   PREOPERATIVE DIAGNOSES:  1. Abnormal uterine bleeding.  2. Simple hyperplasia.   POSTOPERATIVE DIAGNOSES:  1. Abnormal uterine bleeding.  2. Simple hyperplasia.   PROCEDURE:  D and C, hysteroscopy.   SURGEON:  Huel Cote, MD   ASSISTANTS:  None.   ANESTHESIA:  MAC with 1% lidocaine paracervical block.   FINDINGS:  There is a normal cavity which was slightly arcuate in shape.  There were some excess tissue which was present despite the patient  being postmenopausal.   SPECIMENS:  Endometrial curettings were sent to Pathology.   ESTIMATED BLOOD LOSS:  Minimal.   HYSTEROSCOPIC DEFICIT:  30 mL.   IV FLUIDS:  1200 mL LR.   URINE OUTPUT:  50 mL straight cath prior to procedure.   COMPLICATIONS:  None known.   PROCEDURE:  The patient was taken to the operating room where MAC  anesthesia was obtained without difficulty.  She was then prepped and  draped in normal sterile fashion in the dorsal lithotomy position.  A  speculum was placed within the vagina and the cervix identified, and a  single-tooth tenaculum placed on the 12 o'clock position of the cervix  after injection with approximately 2 mL of lidocaine.  An additional 9  mL each was placed at 2 and 10 o'clock for a total of 20 mL of lidocaine  and paracervical block.  At this point, the uterus was easily sounded  secondary to the patient's Cytotec use.  It was sequentially dilated to  approximately 17-19, and the hysteroscope introduced into the cavity.  The cavity appeared to be normal, although slightly arcuate in shape and  did seem to have some excess endometrium covering the  surfaces.  There  was no fibroids or dominant polyps noted.  At this point, the camera was  removed and a curettage performed in all quadrants of the uterus with  some tissue obtained.  At the conclusion of the curettage, the  hysteroscope was reintroduced and the cavity appeared definitely  cleaner.  There were both ostia present and minimal tissue left behind.  Therefore all instruments and sponges were removed from the patient's  vagina.  The tenaculum was removed from the cervix and appeared to be  hemostatic.  The vagina and cervix were cleansed and any remnants of  Cytotec removed from the posterior fornix and no active bleeding noted.  At the conclusion of the procedure, all sponge, lap, and needle counts  were correct x2, and the patient was awakened completely and taken to  the recovery room.      Huel Cote, M.D.  Electronically Signed     KR/MEDQ  D:  06/30/2008  T:  06/30/2008  Job:  [id number]

## 2010-09-24 NOTE — H&P (Signed)
NAMECARLEENA, Shannon Navarro             ACCOUNT NO.:  [id number]   MEDICAL RECORD NO.:  [id number]          PATIENT TYPE:  AMB   LOCATION:  SDC                           FACILITY:  [facility]   PHYSICIAN:  Huel Cote, M.D. DATE OF BIRTH:  05-03-1956   DATE OF ADMISSION:  06/30/2008  DATE OF DISCHARGE:                              HISTORY & PHYSICAL   DATE OF SURGERY:  June 30, 2008 at 9:00 a.m. at the [facility]  facility.   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  The patient was given the option of medical  management with Provera.  However, declined any hormonal treatment due  to a strong family history of breast cancer and we discussed then  performing D and C and surveillance with biopsies every 6 months if it  is indeed consistent with simple hyperplasia and no atypia.  The patient  is agreeable to this plan and wants to avoid any hormonal therapy  whatsoever.   PAST MEDICAL HISTORY:  Pseudotumors cerebri and reflux disease.   PAST SURGICAL HISTORY:  Tubal ligation only.   PAST OBSTETRICAL HISTORY:  Two vaginal deliveries.   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  However, her  last Pap smear in July 2009 was normal.  She does have a strong family  history of breast cancer in both mother, 2 aunts, paternal grandmother,  and a niece.  She currently takes Protonix as her only medication.   Her allergies include PENICILLIN, SULFA, HYDROCODONE.   PHYSICAL EXAM:  VITAL SIGNS:  Her weight is 230 pounds, blood pressure  142/100.  CARDIAC:  Regular rate and rhythm.  LUNGS:  Clear.  ABDOMEN:  Soft and nontender.  GENITOURINARY:  Cervix has slightly stenotic os.  However, this was  dilated with biopsy performed earlier without difficulty.   HOSPITAL COURSE:  She  again had the biopsy returned as simple  hyperplasia with no atypia seen, and declined any further therapy  hormonally.  Therefore, we will proceed with a D and C hysteroscopy to  ensure at that this is indeed simple hyperplasia with no atypia on a  bigger sample, and if this is the case will perform surveillance with  endometrial biopsies every 3 to 6 months and possibly intervene if the  hyperplasia is still present, with a Jearld Adjutant  IUD.  The risks and benefits of the procedure were discussed with the  patient in detail including bleeding and uterine perforation.  She will  indeed use Cytotec prior to the procedure to aid with this and hopefully  decrease the risk of any complications.      Huel Cote, M.D.  Electronically Signed     KR/MEDQ  D:  06/29/2008  T:  06/29/2008  Job:  [id number]

## 2010-09-24 NOTE — Assessment & Plan Note (Signed)
Shannon Navarro                         GASTROENTEROLOGY OFFICE NOTE   Shannon, Navarro                    MRN:          [id number]  DATE:06/21/2007                            DOB:          December 03, 1955    [name] is a 55 year old white female who is here today because of  episodes of dysphagia, which lasted several days and occurred about five  to six weeks ago.  She went to urgent medical care and ws given Zantac  150 mg p.o. b.i.d.  Her symptoms have markedly improved.  We saw Ms.  Abella in March 2006 with acute food impaction.  Her upper endoscopy  at that time did not show any evidence of esophageal stricture, but we  did pass several dilators through, assuming that she might have had an  esophageal web.  She was on Protonix 40 mg a day for a while, but  discontinued the medicine.  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  About a week ago, patient developed right  flank pain and was seen in emergency room where CT scan did not show any  kidney stones or any urological problem.  Urinalysis was normal.  Three  days later she developed rash around the right costal margin to the  back, consistent with herpes zoster.  The pain is getting somewhat  better now.   MEDICATIONS:  Zantac 150 mg p.o. b.i.d.   PHYSICAL EXAMINATION:  Blood pressure 110/80, pulse 64, weight 227.6  pounds.  Patient was alert, oriented, in no distress.  She was clearly  overweight.  Sclerae nonicteric.  LUNGS:  Clear to auscultation.  COR:  Quiet precordium.  Normal S1, S2.  On the right side of the trunk  around the 9th or 10th rib laterally, there was a large erythematous  macular papular legions with central ulcerations suggestive of herpes  zoster.  These were quite tender.  There were about 3 or 4 of them.  ABDOMEN:  Showed mild tenderness in epigastrium and over  the suprapubic  area.  RECTAL:  Soft.  Hemoccult negative stool.   IMPRESSION:  1. [age]-year-old white female with new onset of herpes zoster right      intercostal area.  2. Gastroesophageal reflux disease with intermittent dysphagia.      History of food impaction.  Rule out recurrent stricture.  Patient      has been improved on Zantac.  3. Dyspepsia.  Rule out biliary disease.  Patient has a strong family      history of gallbladder disease in several of her aunts.   PLAN:  1. Upper abdominal ultrasound.  2. Protonix 40 mg daily.  3. Valtrex 1000 mg 3 times a day x1 week.  4. Upper endoscopy.  Patient would like to wait scheduling the      endoscopy pending results of her ultrasound and resolution of her      shingles.     Hedwig Morton. Juanda Chance, MD  Electronically Signed    DMB/MedQ  DD:  06/21/2007  DT: 06/21/2007  Job #: [id number]   cc:   Gregary Signs A. Everardo All, MD

## 2010-09-25 ENCOUNTER — Other Ambulatory Visit: Payer: Self-pay | Admitting: Obstetrics and Gynecology

## 2010-09-25 DIAGNOSIS — Z1231 Encounter for screening mammogram for malignant neoplasm of breast: Secondary | ICD-10-CM

## 2010-09-26 ENCOUNTER — Encounter: Payer: Self-pay | Admitting: Internal Medicine

## 2010-10-04 ENCOUNTER — Ambulatory Visit (INDEPENDENT_AMBULATORY_CARE_PROVIDER_SITE_OTHER): Payer: Managed Care, Other (non HMO) | Admitting: Internal Medicine

## 2010-10-04 ENCOUNTER — Encounter: Payer: Self-pay | Admitting: Internal Medicine

## 2010-10-04 VITALS — BP 118/78 | HR 72 | Temp 97.6°F | Ht 66.0 in | Wt 243.0 lb

## 2010-10-04 DIAGNOSIS — I10 Essential (primary) hypertension: Secondary | ICD-10-CM

## 2010-10-04 DIAGNOSIS — K219 Gastro-esophageal reflux disease without esophagitis: Secondary | ICD-10-CM

## 2010-10-04 DIAGNOSIS — Z0000 Encounter for general adult medical examination without abnormal findings: Secondary | ICD-10-CM

## 2010-10-04 MED ORDER — VENLAFAXINE HCL ER 75 MG PO CP24: 75.0000 mg | ORAL_CAPSULE | Freq: Every day | ORAL | Status: DC

## 2010-10-04 MED ORDER — LOSARTAN POTASSIUM 100 MG PO TABS: 100.0000 mg | ORAL_TABLET | Freq: Every day | ORAL | Status: DC

## 2010-10-04 MED ORDER — CETIRIZINE HCL 10 MG PO TABS: 10.0000 mg | ORAL_TABLET | Freq: Every day | ORAL | Status: DC

## 2010-10-04 MED ORDER — RABEPRAZOLE SODIUM 20 MG PO TBEC: 20.0000 mg | DELAYED_RELEASE_TABLET | Freq: Every day | ORAL | Status: DC

## 2010-10-04 NOTE — Patient Instructions (Signed)
It was good to see you today. Change back to Aciphex - other medications reviewed, no other changes at this time. Refill on medication(s) as discussed today. Please keep scheduled followup as planned, call sooner if problems.

## 2010-10-04 NOTE — Progress Notes (Signed)
  Subjective:    Patient ID: Shannon Navarro, female    DOB: Jul 03, 1955, 55 y.o.   MRN: [id number]  HPI Here for follow up - reviewed chronic medical issues:  GERD - hx food impaction 2006 - takes PPI and reports compliance with ongoing medical treatment. no changes in medication dose or frequency - prefers aciphex to nexium. denies adverse side effects related to current therapy.   anxiety and "hot flash symptoms" - menopause > 5 yrs - describes as flushed feeling and nervousness with speaking or public presentations - rx for xanax helpful (not taking, just helps to "know that it is there") -started venlafax 09/2009, inc dose 12/2009 - symptoms improved   HTN - started arb 06/2010 for same, then changed to generic 07/2010; FH same - no HA or edema - reports compliance with ongoing medical treatment and no changes in medication dose or frequency. denies adverse side effects related to current therapy  Past Medical History  Diagnosis Date  . Obesity, unspecified   . HYPERGLYCEMIA   . Migraine   . Shingles   . ALLERGIC RHINITIS   . ANXIETY   . GERD   . HYPERTENSION      Review of Systems  Constitutional: Negative for appetite change.  Respiratory: Negative for cough.   Cardiovascular: Negative for chest pain.  Neurological: Negative for headaches.       Objective:   Physical Exam BP 118/78  Pulse 72  Temp(Src) 97.6 F (36.4 C) (Oral)  Ht 5\' 6"  (1.676 m)  Wt 243 lb (110.224 kg)  BMI 39.22 kg/m2  SpO2 98% Physical Exam  Constitutional: She is oriented to person, place, and time. She appears well-developed and well-nourished. No distress.  Cardiovascular: Normal rate, regular rhythm and normal heart sounds.  No murmur heard. No BLE edema. Pulmonary/Chest: Effort normal and breath sounds normal. No respiratory distress. She has no wheezes.  Psychiatric: She has a normal mood and affect. Her behavior is normal. Judgment and thought content normal.   Lab Results  Component  Value Date   WBC 6.0 12/10/2009   HGB 14.3 12/10/2009   HCT 41.2 12/10/2009   PLT 258.0 12/10/2009   CHOL 179 12/10/2009   TRIG 71.0 12/10/2009   HDL 40.40 12/10/2009   ALT 30 12/10/2009   AST 20 12/10/2009   NA 141 12/10/2009   K 4.7 12/10/2009   CL 106 12/10/2009   CREATININE 0.8 12/10/2009   BUN 17 12/10/2009   CO2 30 12/10/2009   TSH 1.28 12/10/2009   HGBA1C 5.8 12/10/2009        Assessment & Plan:  See problem list. Medications and labs reviewed today. Time spent with pt today 25 minutes, greater than 50% time spent counseling patient on hypertension, GERD vs impaction and medication review. Also review of prior records

## 2010-10-07 NOTE — Assessment & Plan Note (Signed)
Hx food impaction reviewed -  Currently, symptoms not well controlled but pt attributes to change from aciphex (her preferred PPI) Resume aciphex and pt to call if regurgitation/burning not improved for re-eval by GI - new erx done

## 2010-10-07 NOTE — Assessment & Plan Note (Signed)
Changed from Diovan to generic ARB 07/2010 - BP well controlled BP Readings from Last 3 Encounters:  10/04/10 118/78  07/12/10 128/88  06/21/10 140/88   The current medical regimen is effective;  continue present plan and medications.

## 2010-11-01 ENCOUNTER — Ambulatory Visit
Admission: RE | Admit: 2010-11-01 | Discharge: 2010-11-01 | Disposition: A | Discharge status: Home / Self Care | Payer: Managed Care, Other (non HMO) | Source: Ambulatory Visit | Attending: Obstetrics and Gynecology | Admitting: Obstetrics and Gynecology

## 2010-11-01 DIAGNOSIS — Z1231 Encounter for screening mammogram for malignant neoplasm of breast: Secondary | ICD-10-CM

## 2010-11-09 ENCOUNTER — Encounter: Payer: Self-pay | Admitting: Internal Medicine

## 2010-12-17 ENCOUNTER — Other Ambulatory Visit (INDEPENDENT_AMBULATORY_CARE_PROVIDER_SITE_OTHER): Payer: Managed Care, Other (non HMO)

## 2010-12-17 DIAGNOSIS — Z0000 Encounter for general adult medical examination without abnormal findings: Secondary | ICD-10-CM

## 2010-12-17 LAB — HEPATIC FUNCTION PANEL
Bilirubin, Direct: 0.1 mg/dL (ref 0.0–0.3)
Total Bilirubin: 1 mg/dL (ref 0.3–1.2)

## 2010-12-17 LAB — URINALYSIS
Bilirubin Urine: NEGATIVE
Hgb urine dipstick: NEGATIVE
Ketones, ur: NEGATIVE
Nitrite: NEGATIVE
Total Protein, Urine: NEGATIVE

## 2010-12-17 LAB — CBC WITH DIFFERENTIAL/PLATELET
Eosinophils Relative: 5.1 % — ABNORMAL HIGH (ref 0.0–5.0)
HCT: 41.9 % (ref 36.0–46.0)
Lymphs Abs: 1.5 10*3/uL (ref 0.7–4.0)
MCV: 84.2 fl (ref 78.0–100.0)
Monocytes Absolute: 0.5 10*3/uL (ref 0.1–1.0)
Platelets: 265 10*3/uL (ref 150.0–400.0)
RDW: 13.3 % (ref 11.5–14.6)
WBC: 5.7 10*3/uL (ref 4.5–10.5)

## 2010-12-17 LAB — BASIC METABOLIC PANEL
BUN: 13 mg/dL (ref 6–23)
Chloride: 106 mEq/L (ref 96–112)
Glucose, Bld: 117 mg/dL — ABNORMAL HIGH (ref 70–99)
Potassium: 3.7 mEq/L (ref 3.5–5.1)

## 2010-12-17 LAB — LIPID PANEL
LDL Cholesterol: 119 mg/dL — ABNORMAL HIGH (ref 0–99)
Total CHOL/HDL Ratio: 4
VLDL: 16.8 mg/dL (ref 0.0–40.0)

## 2010-12-20 ENCOUNTER — Encounter: Payer: Self-pay | Admitting: Internal Medicine

## 2010-12-20 ENCOUNTER — Ambulatory Visit (INDEPENDENT_AMBULATORY_CARE_PROVIDER_SITE_OTHER): Payer: Managed Care, Other (non HMO) | Admitting: Internal Medicine

## 2010-12-20 VITALS — BP 118/84 | HR 67 | Temp 98.2°F | Ht 66.0 in | Wt 242.8 lb

## 2010-12-20 DIAGNOSIS — Z0000 Encounter for general adult medical examination without abnormal findings: Secondary | ICD-10-CM

## 2010-12-20 DIAGNOSIS — K219 Gastro-esophageal reflux disease without esophagitis: Secondary | ICD-10-CM

## 2010-12-20 DIAGNOSIS — I10 Essential (primary) hypertension: Secondary | ICD-10-CM

## 2010-12-20 DIAGNOSIS — R7309 Other abnormal glucose: Secondary | ICD-10-CM

## 2010-12-20 NOTE — Assessment & Plan Note (Signed)
Hx food impaction reviewed (2006) -  Currently, symptoms well controlled on aciphex (her preferred PPI) continue aciphex and pt to call if problems for re-eval by GI -

## 2010-12-20 NOTE — Patient Instructions (Addendum)
It was good to see you today. Exam, and EKG (heart) look fine - will watch the sugar Work on lifestyle changes as discussed (low fat, low carb, increased protein diet; improved exercise efforts; weight loss) to control sugar, blood pressure and cholesterol levels and/or reduce risk of developing other medical problems. Look into [email or web address] or other type of food journal to assist you in this process. Please schedule followup in 3-6 months, call sooner if problems.

## 2010-12-20 NOTE — Progress Notes (Signed)
Subjective:    Patient ID: Shannon Navarro, female    DOB: [birth year], 55 y.o.   MRN: [id number]  HPI patient is here today for annual physical. Patient feels well and has no complaints.  Past Medical History  Diagnosis Date  . Obesity, unspecified   . HYPERGLYCEMIA   . Migraine   . Shingles   . ALLERGIC RHINITIS   . ANXIETY   . HYPERTENSION   . Menopause     @ 48  . Pseudotumor cerebri 2007  . GERD 07/2004    hx food impaction   Family History  Problem Relation Age of Onset  . Breast cancer Mother   . Cancer Mother     breast   History  Substance Use Topics  . Smoking status: Never Smoker   . Smokeless tobacco: Not on file   Comment: Married lives with spouse- 2 daughters 45 & 20  . Alcohol Use: No   Review of Systems Constitutional: Negative for fever.  Respiratory: Negative for cough and shortness of breath.   Cardiovascular: Negative for chest pain.  Gastrointestinal: Negative for abdominal pain.  Musculoskeletal: Negative for gait problem.  Skin: Negative for rash.  Neurological: Negative for dizziness.  No other specific complaints in a complete review of systems (except as listed in HPI above).     Objective:   Physical Exam BP 118/84  Pulse 67  Temp(Src) 98.2 F (36.8 C) (Oral)  Ht 5\' 6"  (1.676 m)  Wt 242 lb 12.8 oz (110.133 kg)  BMI 39.19 kg/m2  SpO2 96% BP Readings from Last 3 Encounters:  12/20/10 118/84  10/04/10 118/78  07/12/10 128/88   Wt Readings from Last 3 Encounters:  12/20/10 242 lb 12.8 oz (110.133 kg)  10/04/10 243 lb (110.224 kg)  07/12/10 242 lb 12.8 oz (110.133 kg)   Constitutional: She isoverweight. She appears well-developed and well-nourished. No distress.  HENT: Head: Normocephalic and atraumatic. Ears: B TMs ok, no erythema or effusion; Nose: Nose normal.  Mouth/Throat: Oropharynx is clear and moist. No oropharyngeal exudate.  Eyes: Conjunctivae and EOM are normal. Pupils are equal, round, and reactive to light. No  scleral icterus.  Neck: Normal range of motion. Neck supple. No JVD present. No thyromegaly present.  Cardiovascular: Normal rate, regular rhythm and normal heart sounds.  No murmur heard. No BLE edema. Pulmonary/Chest: Effort normal and breath sounds normal. No respiratory distress. She has no wheezes.  Abdominal: Soft. Bowel sounds are normal. She exhibits no distension. There is no tenderness. no masses Musculoskeletal: Normal range of motion, no joint effusions. No gross deformities Neurological: She is alert and oriented to person, place, and time. No cranial nerve deficit. Coordination normal.  Skin: Skin is warm and dry. No rash noted. No erythema.  Psychiatric: She has a normal mood and affect. Her behavior is normal. Judgment and thought content normal.   Lab Results  Component Value Date   WBC 5.7 12/17/2010   HGB 14.1 12/17/2010   HCT 41.9 12/17/2010   PLT 265.0 12/17/2010   CHOL 180 12/17/2010   TRIG 84.0 12/17/2010   HDL 44.30 12/17/2010   ALT 26 12/17/2010   AST 19 12/17/2010   NA 142 12/17/2010   K 3.7 12/17/2010   CL 106 12/17/2010   CREATININE 0.7 12/17/2010   BUN 13 12/17/2010   CO2 29 12/17/2010   TSH 1.48 12/17/2010   HGBA1C 5.8 12/10/2009   EKG: sinus @ 60bpm - no change from 12/14/09 ekg     Assessment &  Plan:  CPX - v70.0 - Patient has been counseled on age-appropriate routine health concerns for screening and prevention. These are reviewed and up-to-date. Immunizations are up-to-date or declined. Labs and ECG reviewed.  Also see problem list. Medications and labs reviewed today.

## 2010-12-20 NOTE — Assessment & Plan Note (Signed)
Changed from Diovan to generic ARB 07/2010 - BP well controlled BP Readings from Last 3 Encounters:  12/20/10 118/84  10/04/10 118/78  07/12/10 128/88   The current medical regimen is effective;  continue present plan and medications.

## 2010-12-20 NOTE — Assessment & Plan Note (Signed)
Reviewed risk for DM development - pt to reduce sugar intake with diet changes and increase exercise efforts to reduce weight and reduce DM risk Lab Results  Component Value Date   HGBA1C 5.8 12/10/2009

## 2011-04-01 ENCOUNTER — Ambulatory Visit (INDEPENDENT_AMBULATORY_CARE_PROVIDER_SITE_OTHER): Payer: Managed Care, Other (non HMO) | Admitting: Internal Medicine

## 2011-04-01 ENCOUNTER — Encounter: Payer: Self-pay | Admitting: Internal Medicine

## 2011-04-01 VITALS — BP 112/72 | HR 72 | Temp 98.6°F

## 2011-04-01 DIAGNOSIS — J029 Acute pharyngitis, unspecified: Secondary | ICD-10-CM

## 2011-04-01 DIAGNOSIS — J019 Acute sinusitis, unspecified: Secondary | ICD-10-CM

## 2011-04-01 MED ORDER — CLARITHROMYCIN 500 MG PO TABS: 500.0000 mg | ORAL_TABLET | Freq: Two times a day (BID) | ORAL | Status: DC

## 2011-04-01 NOTE — Progress Notes (Signed)
  Subjective:    Patient ID: Shannon Navarro, female    DOB: January 12, 1956, 55 y.o.   MRN: [id number]  HPI complains of sore throat Onset 8 days ago Also associated with sinus congestion symptoms wax and wane - worse in past 2 days  Past Medical History  Diagnosis Date  . Obesity, unspecified   . HYPERGLYCEMIA   . Migraine   . Shingles   . ALLERGIC RHINITIS   . ANXIETY   . HYPERTENSION   . Menopause     @ 48  . Pseudotumor cerebri 2007  . GERD 07/2004    hx food impaction    Review of Systems Positive for LGF No headache or vision changes minimal cough, dry    Objective:   Physical Exam BP 112/72  Pulse 72  Temp(Src) 98.6 F (37 C) (Oral)  SpO2 97% Constitutional: She is overweight, appears well-developed and well-nourished. No distress.  HENT: Head: Normocephalic and atraumatic. Ears: B TMs ok, no erythema or effusion; Nose: Nose normal.  Mouth/Throat: Oropharynx is clear and moist. oropharyngeal exudate on R tonsil.  Eyes: Conjunctivae and EOM are normal. Pupils are equal, round, and reactive to light. No scleral icterus.  Neck: Normal range of motion. Neck supple. No JVD present. No thyromegaly present.  Cardiovascular: Normal rate, regular rhythm and normal heart sounds.  No murmur heard. No BLE edema. Pulmonary/Chest: Effort normal and breath sounds normal. No respiratory distress. She has no wheezes.      Assessment & Plan:  Acute exudative pharyngitis - Sinusitis, acute  Biaxin - erx done Tylenol/advil - hydrate and salt water gargle

## 2011-04-01 NOTE — Patient Instructions (Signed)
It was good to see you today. Biaxin antibiotics twice a day for one week - Your prescription(s) have been submitted to your pharmacy. Please take as directed and contact our office if you believe you are having problem(s) with the medication(s). Use Advil orTylenol and salt water gargles as described; keep hydrated and rest when able

## 2011-05-29 ENCOUNTER — Other Ambulatory Visit: Payer: Self-pay | Admitting: *Deleted

## 2011-05-29 MED ORDER — SCOPOLAMINE 1 MG/3DAYS TD PT72: 1.0000 | MEDICATED_PATCH | TRANSDERMAL | Status: DC

## 2011-05-29 NOTE — Telephone Encounter (Signed)
Pt states she is getting ready to go on a cruise. Requesting md to rx patch for motion sickness....05/29/11@9 :56am/LMB

## 2011-05-29 NOTE — Telephone Encounter (Signed)
done

## 2011-05-29 NOTE — Telephone Encounter (Signed)
Called pt no ansew LMOM rx sent to pharmacy.Marland KitchenMarland Kitchen1/17/13@1 :46pm/LMB

## 2011-06-03 ENCOUNTER — Other Ambulatory Visit: Payer: Self-pay | Admitting: *Deleted

## 2011-06-03 NOTE — Telephone Encounter (Signed)
Pt states insurance will not cover the patches that md sent in. Pharmacy states could use promethazine patch as well. Requesting md to send in promethazine. Inform pt md is out of office mightl be tomorrow b4 we get md response...06/03/11@10 :02am/LMB

## 2011-06-04 NOTE — Telephone Encounter (Signed)
i do not see promethazine patch on EMR list - ok to call pharmacy and authorize sameverbally) for tx motion sickness - thanks

## 2011-06-04 NOTE — Telephone Encounter (Signed)
Called [facility] spoke with Mike/pharmacist he states not sure what pt is talking about. Does not make promethazine patch. Can dispense pill form if md want or also can use meclizine....06/04/11@4 :19pm/LMB

## 2011-06-05 MED ORDER — MECLIZINE HCL 25 MG PO TABS: 25.0000 mg | ORAL_TABLET | Freq: Three times a day (TID) | ORAL | Status: AC | PRN

## 2011-06-05 NOTE — Telephone Encounter (Signed)
Called pt no answer LMOM md response meclizine sent to pharmacy...06/05/11@8 :42am/LMB

## 2011-06-05 NOTE — Telephone Encounter (Signed)
Thanks for clarification as i was also unaware of "prometh patch"!  New erx for oral meclizine done, no patch - thanks

## 2011-07-04 ENCOUNTER — Ambulatory Visit (INDEPENDENT_AMBULATORY_CARE_PROVIDER_SITE_OTHER): Payer: Managed Care, Other (non HMO) | Admitting: Internal Medicine

## 2011-07-04 ENCOUNTER — Encounter: Payer: Self-pay | Admitting: Internal Medicine

## 2011-07-04 VITALS — BP 120/84 | HR 80 | Temp 97.0°F

## 2011-07-04 DIAGNOSIS — J0100 Acute maxillary sinusitis, unspecified: Secondary | ICD-10-CM

## 2011-07-04 MED ORDER — CLARITHROMYCIN 500 MG PO TABS: 500.0000 mg | ORAL_TABLET | Freq: Two times a day (BID) | ORAL | Status: AC

## 2011-07-04 NOTE — Progress Notes (Signed)
  Subjective:    HPI  complains of sinus symptoms  Onset >1 week ago, wax/wane symptoms  associated with rhinorrhea, sneezing, sore throat, mild headache and low grade fever Also myalgias, sinus pressure and mild chest congestion No relief with OTC meds Precipitated by sick contacts  Past Medical History  Diagnosis Date  . Obesity, unspecified   . HYPERGLYCEMIA   . Migraine   . Shingles   . ALLERGIC RHINITIS   . ANXIETY   . HYPERTENSION   . Menopause     @ 48  . Pseudotumor cerebri 2007  . GERD 07/2004    hx food impaction    Review of Systems Constitutional: No night sweats, no unexpected weight change Pulmonary: No pleurisy or hemoptysis Cardiovascular: No chest pain or palpitations     Objective:   Physical Exam BP 120/84  Pulse 80  Temp(Src) 97 F (36.1 C) (Oral)  SpO2 97% GEN: mildly ill appearing and audible head congestion HENT: NCAT, mild maxillary sinus tenderness bilaterally, nares with clear discharge, oropharynx mild erythema, no exudate Eyes: Vision grossly intact, no conjunctivitis Lungs: Clear to auscultation without rhonchi or wheeze, no increased work of breathing Cardiovascular: Regular rate and rhythm, no bilateral edema      Assessment & Plan:  Viral URI >> sinusitis, acute   Empiric antibiotics prescribed due to symptom duration greater than 7 days - biaxin Decongestants OTC advised Symptomatic care with Tylenol or Advil, hydration and rest -  salt gargle advised as needed

## 2011-07-04 NOTE — Progress Notes (Deleted)
  Subjective:    Patient ID: Shannon Navarro, female    DOB: [birth year], 56 y.o.   MRN: [id number]  HPI    Review of Systems     Objective:   Physical Exam        Assessment & Plan:

## 2011-07-04 NOTE — Patient Instructions (Signed)
It was good to see you today. Biaxin antibiotics twice a day for one week - Your prescription(s) have been submitted to your pharmacy. Please take as directed and contact our office if you believe you are having problem(s) with the medication(s). Use behind-the-counter decongestant (nighttime formula or daytime) as needed - also Afrin nasal spray for up to 5 days and breathe right strep Use Advil orTylenol and salt water gargles as described; keep hydrated and rest when able

## 2011-09-01 ENCOUNTER — Other Ambulatory Visit: Payer: Self-pay | Admitting: Internal Medicine

## 2011-09-29 ENCOUNTER — Other Ambulatory Visit: Payer: Self-pay | Admitting: Obstetrics and Gynecology

## 2011-09-29 ENCOUNTER — Telehealth: Payer: Self-pay | Admitting: *Deleted

## 2011-09-29 DIAGNOSIS — Z0000 Encounter for general adult medical examination without abnormal findings: Secondary | ICD-10-CM

## 2011-09-29 DIAGNOSIS — Z1231 Encounter for screening mammogram for malignant neoplasm of breast: Secondary | ICD-10-CM

## 2011-09-29 NOTE — Telephone Encounter (Signed)
Message copied by Deatra James on Mon Sep 29, 2011 12:05 PM ------      Message from: COUSIN, SHARON T      Created: Mon Sep 29, 2011 11:44 AM      Regarding: PHY DATE  12/26/11       THANKS

## 2011-09-29 NOTE — Telephone Encounter (Signed)
Received staff msg pt schedule cpx need labs in epic... 09/29/11@12 :07pm/lMB

## 2011-11-28 ENCOUNTER — Encounter: Payer: Self-pay | Admitting: Internal Medicine

## 2011-11-28 ENCOUNTER — Ambulatory Visit (INDEPENDENT_AMBULATORY_CARE_PROVIDER_SITE_OTHER): Payer: BC Managed Care – PPO | Admitting: Internal Medicine

## 2011-11-28 VITALS — BP 142/98 | HR 71 | Temp 97.7°F | Ht 66.0 in | Wt 248.6 lb

## 2011-11-28 DIAGNOSIS — K1370 Unspecified lesions of oral mucosa: Secondary | ICD-10-CM

## 2011-11-28 DIAGNOSIS — K1379 Other lesions of oral mucosa: Secondary | ICD-10-CM

## 2011-11-28 DIAGNOSIS — J069 Acute upper respiratory infection, unspecified: Secondary | ICD-10-CM

## 2011-11-28 MED ORDER — LIDOCAINE VISCOUS 2 % MT SOLN: 10.0000 mL | OROMUCOSAL | Status: AC | PRN

## 2011-11-28 MED ORDER — VALACYCLOVIR HCL 1 G PO TABS: 1000.0000 mg | ORAL_TABLET | Freq: Three times a day (TID) | ORAL | Status: DC

## 2011-11-28 NOTE — Progress Notes (Signed)
  Subjective:    Patient ID: Shannon Navarro, female    DOB: 1955/05/18, 56 y.o.   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 Obesity, unspecified   . HYPERGLYCEMIA   . Migraine   . Shingles   . ALLERGIC RHINITIS   . ANXIETY   . HYPERTENSION   . Menopause     @ 48  . Pseudotumor cerebri 2007  . GERD 07/2004    hx food impaction    Review of Systems  Constitutional: Negative for fever and fatigue.  HENT: Positive for mouth sores. Negative for ear pain, neck pain, neck stiffness, postnasal drip and tinnitus.   Respiratory: Negative for cough and shortness of breath.   Cardiovascular: Negative for chest pain.  Neurological: Negative for dizziness and weakness.       Objective:   Physical Exam BP 142/98  Pulse 71  Temp 97.7 F (36.5 C) (Temporal)  Ht 5\' 6"  (1.676 m)  Wt 248 lb 9.6 oz (112.764 kg)  BMI 40.13 kg/m2  SpO2 98% Wt Readings from Last 3 Encounters:  11/28/11 248 lb 9.6 oz (112.764 kg)  12/20/10 242 lb 12.8 oz (110.133 kg)  10/04/10 243 lb (110.224 kg)   Constitutional: She appears well-developed and well-nourished. No distress. nontoic HENT: Head: Normocephalic and atraumatic, sinus nontender. Ears: B TMs ok, no erythema or effusion; Nose: Nose normal. Mouth/Throat: Oropharynx is clear but R soft palate with erythema and vesicles - also R buccal muscosa. No oropharyngeal exudate.  Eyes: Conjunctivae and EOM are normal. Pupils are equal, round, and reactive to light. No scleral icterus.  Neck: Normal range of motion. Neck supple. No JVD present. No thyromegaly present.  Cardiovascular: Normal rate, regular rhythm and normal heart sounds.  No murmur heard. No BLE edema. Pulmonary/Chest: Effort normal and breath  sounds normal. No respiratory distress. She has no wheezes.  Musculoskeletal: Normal range of motion, no joint effusions. No gross deformities Neurological: She is alert and oriented to person, place, and time. No cranial nerve deficit. Coordination normal.  Skin: Skin is warm and dry. No rash noted. No erythema. palms and soles without lesions Psychiatric: She has a normal mood and affect. Her behavior is normal. Judgment and thought content normal.   Lab Results  Component Value Date   WBC 5.7 12/17/2010   HGB 14.1 12/17/2010   HCT 41.9 12/17/2010   PLT 265.0 12/17/2010   GLUCOSE 117* 12/17/2010   CHOL 180 12/17/2010   TRIG 84.0 12/17/2010   HDL 44.30 12/17/2010   LDLCALC 119* 12/17/2010   ALT 26 12/17/2010   AST 19 12/17/2010   NA 142 12/17/2010   K 3.7 12/17/2010   CL 106 12/17/2010   CREATININE 0.7 12/17/2010   BUN 13 12/17/2010   CO2 29 12/17/2010   TSH 1.48 12/17/2010   HGBA1C 5.9 12/17/2010       Assessment & Plan:  Mouth sores - suspect Viral infection valtrex tid x 1 week, lidcaine gel prn and tylenol Call if worse or unimproved

## 2011-11-28 NOTE — Patient Instructions (Signed)
It was good to see you today. Valtrex 3x/day x 1 week and lidocaine as needed to numb mouth pain Your prescription(s) have been submitted to your pharmacy. Please take as directed and contact our office if you believe you are having problem(s) with the medication(s). Alternate between ibuprofen and tylenol for aches, pain and fever symptoms as discussed

## 2011-12-15 ENCOUNTER — Ambulatory Visit
Admission: RE | Admit: 2011-12-15 | Discharge: 2011-12-15 | Disposition: A | Discharge status: Home / Self Care | Payer: Managed Care, Other (non HMO) | Source: Ambulatory Visit | Attending: Obstetrics and Gynecology | Admitting: Obstetrics and Gynecology

## 2011-12-15 DIAGNOSIS — Z1231 Encounter for screening mammogram for malignant neoplasm of breast: Secondary | ICD-10-CM

## 2011-12-22 ENCOUNTER — Other Ambulatory Visit (INDEPENDENT_AMBULATORY_CARE_PROVIDER_SITE_OTHER): Payer: BC Managed Care – PPO

## 2011-12-22 DIAGNOSIS — Z0000 Encounter for general adult medical examination without abnormal findings: Secondary | ICD-10-CM

## 2011-12-22 LAB — BASIC METABOLIC PANEL
CO2: 27 mEq/L (ref 19–32)
Chloride: 107 mEq/L (ref 96–112)
Potassium: 4.2 mEq/L (ref 3.5–5.1)
Sodium: 141 mEq/L (ref 135–145)

## 2011-12-22 LAB — CBC WITH DIFFERENTIAL/PLATELET
Basophils Absolute: 0.1 10*3/uL (ref 0.0–0.1)
Eosinophils Relative: 5.9 % — ABNORMAL HIGH (ref 0.0–5.0)
HCT: 42.3 % (ref 36.0–46.0)
Lymphocytes Relative: 22.9 % (ref 12.0–46.0)
Monocytes Relative: 10.7 % (ref 3.0–12.0)
Neutrophils Relative %: 59.6 % (ref 43.0–77.0)
Platelets: 242 10*3/uL (ref 150.0–400.0)
WBC: 5.8 10*3/uL (ref 4.5–10.5)

## 2011-12-22 LAB — LIPID PANEL
HDL: 41.7 mg/dL (ref >39.00)
LDL Cholesterol: 89 mg/dL (ref 0–99)
Total CHOL/HDL Ratio: 4
VLDL: 18.2 mg/dL (ref 0.0–40.0)

## 2011-12-22 LAB — URINALYSIS, ROUTINE W REFLEX MICROSCOPIC
Nitrite: NEGATIVE
Specific Gravity, Urine: >=1.03 (ref 1.000–1.030)
Total Protein, Urine: NEGATIVE
Urine Glucose: NEGATIVE
Urobilinogen, UA: 0.2 (ref 0.0–1.0)

## 2011-12-22 LAB — TSH: TSH: 1.97 u[IU]/mL (ref 0.35–5.50)

## 2011-12-22 LAB — HEPATIC FUNCTION PANEL
Albumin: 3.5 g/dL (ref 3.5–5.2)
Alkaline Phosphatase: 65 U/L (ref 39–117)
Bilirubin, Direct: 0.1 mg/dL (ref 0.0–0.3)
Total Protein: 7.2 g/dL (ref 6.0–8.3)

## 2011-12-26 ENCOUNTER — Ambulatory Visit (INDEPENDENT_AMBULATORY_CARE_PROVIDER_SITE_OTHER): Payer: BC Managed Care – PPO | Admitting: Internal Medicine

## 2011-12-26 ENCOUNTER — Encounter: Payer: Self-pay | Admitting: Internal Medicine

## 2011-12-26 VITALS — BP 114/78 | HR 64 | Temp 98.2°F | Ht 66.0 in | Wt 248.0 lb

## 2011-12-26 DIAGNOSIS — R0683 Snoring: Secondary | ICD-10-CM

## 2011-12-26 DIAGNOSIS — R0989 Other specified symptoms and signs involving the circulatory and respiratory systems: Secondary | ICD-10-CM

## 2011-12-26 DIAGNOSIS — Z0000 Encounter for general adult medical examination without abnormal findings: Secondary | ICD-10-CM

## 2011-12-26 DIAGNOSIS — G4710 Hypersomnia, unspecified: Secondary | ICD-10-CM

## 2011-12-26 DIAGNOSIS — I10 Essential (primary) hypertension: Secondary | ICD-10-CM

## 2011-12-26 MED ORDER — CICLOPIROX 8 % EX SOLN: Freq: Every day | CUTANEOUS | Status: AC

## 2011-12-26 NOTE — Patient Instructions (Signed)
It was good to see you today. We have reviewed your prior records including labs and tests today Health Maintenance reviewed - all recommended immunizations and age-appropriate screenings are up-to-date.  Exam, and EKG (heart) look fine - will watch the sugar Continue to work on lifestyle changes as discussed (low fat, low carb, increased protein diet; improved exercise efforts; weight loss) to control sugar, blood pressure and cholesterol levels and/or reduce risk of developing other medical problems.  Medications reviewed, no changes at this time. PenLac for toenails - Your prescription(s) have been submitted to your pharmacy. Please take as directed and contact our office if you believe you are having problem(s) with the medication(s). we'll make referral to sleep medicine for possible sleep apnea. Our office will contact you regarding appointment(s) once made. Please schedule followup in 6 months for blood pressure and weight check, call sooner if problems. Exercise to Lose Weight Exercise and a healthy diet may help you lose weight. Your doctor may suggest specific exercises. EXERCISE IDEAS AND TIPS  Choose low-cost things you enjoy doing, such as walking, bicycling, or exercising to workout videos.   Take stairs instead of the elevator.   Walk during your lunch break.   Park your car further away from work or school.   Go to a gym or an exercise class.   Start with 5 to 10 minutes of exercise each day. Build up to 30 minutes of exercise 4 to 6 days a week.   Wear shoes with good support and comfortable clothes.   Stretch before and after working out.   Work out until you breathe harder and your heart beats faster.   Drink extra water when you exercise.   Do not do so much that you hurt yourself, feel dizzy, or get very short of breath.  Exercises that burn about 150 calories:  Running 1  miles in 15 minutes.   Playing volleyball for 45 to 60 minutes.   Washing and waxing  a car for 45 to 60 minutes.   Playing touch football for 45 minutes.   Walking 1  miles in 35 minutes.   Pushing a stroller 1  miles in 30 minutes.   Playing basketball for 30 minutes.   Raking leaves for 30 minutes.   Bicycling 5 miles in 30 minutes.   Walking 2 miles in 30 minutes.   Dancing for 30 minutes.   Shoveling snow for 15 minutes.   Swimming laps for 20 minutes.   Walking up stairs for 15 minutes.   Bicycling 4 miles in 15 minutes.   Gardening for 30 to 45 minutes.   Jumping rope for 15 minutes.   Washing windows or floors for 45 to 60 minutes.  Document Released: 05/31/2010 Document Revised: 04/17/2011 Document Reviewed: 05/31/2010 [facility] Patient Information 2012 [place], [place].Health Maintenance, Females A healthy lifestyle and preventative care can promote health and wellness.  Maintain regular health, dental, and eye exams.   Eat a healthy diet. Foods like vegetables, fruits, whole grains, low-fat dairy products, and lean protein foods contain the nutrients you need without too many calories. Decrease your intake of foods high in solid fats, added sugars, and salt. Get information about a proper diet from your caregiver, if necessary.   Regular physical exercise is one of the most important things you can do for your health. Most adults should get at least 150 minutes of moderate-intensity exercise (any activity that increases your heart rate and causes you to sweat) each week. In  addition, most adults need muscle-strengthening exercises on 2 or more days a week.     Maintain a healthy weight. The body mass index (BMI) is a screening tool to identify possible weight problems. It provides an estimate of body fat based on height and weight. Your caregiver can help determine your BMI, and can help you achieve or maintain a healthy weight. For adults 20 years and older:   A BMI below 18.5 is considered underweight.   A BMI of 18.5 to 24.9 is normal.   A  BMI of 25 to 29.9 is considered overweight.   A BMI of 30 and above is considered obese.   Maintain normal blood lipids and cholesterol by exercising and minimizing your intake of saturated fat. Eat a balanced diet with plenty of fruits and vegetables. Blood tests for lipids and cholesterol should begin at age 53 and be repeated every 5 years. If your lipid or cholesterol levels are high, you are over 50, or you are a high risk for heart disease, you may need your cholesterol levels checked more frequently. Ongoing high lipid and cholesterol levels should be treated with medicines if diet and exercise are not effective.   If you smoke, find out from your caregiver how to quit. If you do not use tobacco, do not start.   If you are pregnant, do not drink alcohol. If you are breastfeeding, be very cautious about drinking alcohol. If you are not pregnant and choose to drink alcohol, do not exceed 1 drink per day. One drink is considered to be 12 ounces (355 mL) of beer, 5 ounces (148 mL) of wine, or 1.5 ounces (44 mL) of liquor.   Avoid use of street drugs. Do not share needles with anyone. Ask for help if you need support or instructions about stopping the use of drugs.   High blood pressure causes heart disease and increases the risk of stroke. Blood pressure should be checked at least every 1 to 2 years. Ongoing high blood pressure should be treated with medicines, if weight loss and exercise are not effective.   If you are 29 to 55 years old, ask your caregiver if you should take aspirin to prevent strokes.   Diabetes screening involves taking a blood sample to check your fasting blood sugar level. This should be done once every 3 years, after age 33, if you are within normal weight and without risk factors for diabetes. Testing should be considered at a younger age or be carried out more frequently if you are overweight and have at least 1 risk factor for diabetes.   Breast cancer screening is  essential preventative care for women. You should practice "breast self-awareness." This means understanding the normal appearance and feel of your breasts and may include breast self-examination. Any changes detected, no matter how small, should be reported to a caregiver. Women in their 25s and 30s should have a clinical breast exam (CBE) by a caregiver as part of a regular health exam every 1 to 3 years. After age 2, women should have a CBE every year. Starting at age 59, women should consider having a mammogram (breast X-ray) every year. Women who have a family history of breast cancer should talk to their caregiver about genetic screening. Women at a high risk of breast cancer should talk to their caregiver about having an MRI and a mammogram every year.   The Pap test is a screening test for cervical cancer. Women should have a Pap  test starting at age 37. Between ages 43 and 58, Pap tests should be repeated every 2 years. Beginning at age 18, you should have a Pap test every 3 years as long as the past 3 Pap tests have been normal. If you had a hysterectomy for a problem that was not cancer or a condition that could lead to cancer, then you no longer need Pap tests. If you are between ages 42 and 98, and you have had normal Pap tests going back 10 years, you no longer need Pap tests. If you have had past treatment for cervical cancer or a condition that could lead to cancer, you need Pap tests and screening for cancer for at least 20 years after your treatment. If Pap tests have been discontinued, risk factors (such as a new sexual partner) need to be reassessed to determine if screening should be resumed. Some women have medical problems that increase the chance of getting cervical cancer. In these cases, your caregiver may recommend more frequent screening and Pap tests.   The human papillomavirus (HPV) test is an additional test that may be used for cervical cancer screening. The HPV test looks for  the virus that can cause the cell changes on the cervix. The cells collected during the Pap test can be tested for HPV. The HPV test could be used to screen women aged 70 years and older, and should be used in women of any age who have unclear Pap test results. After the age of 76, women should have HPV testing at the same frequency as a Pap test.   Colorectal cancer can be detected and often prevented. Most routine colorectal cancer screening begins at the age of 59 and continues through age 23. However, your caregiver may recommend screening at an earlier age if you have risk factors for colon cancer. On a yearly basis, your caregiver may provide home test kits to check for hidden blood in the stool. Use of a small camera at the end of a tube, to directly examine the colon (sigmoidoscopy or colonoscopy), can detect the earliest forms of colorectal cancer. Talk to your caregiver about this at age 32, when routine screening begins. Direct examination of the colon should be repeated every 5 to 10 years through age 69, unless early forms of pre-cancerous polyps or small growths are found.   Hepatitis C blood testing is recommended for all people born from [birth year] through 1964 and any individual with known risks for hepatitis C.   Practice safe sex. Use condoms and avoid high-risk sexual practices to reduce the spread of sexually transmitted infections (STIs). Sexually active women aged 78 and younger should be checked for Chlamydia, which is a common sexually transmitted infection. Older women with new or multiple partners should also be tested for Chlamydia. Testing for other STIs is recommended if you are sexually active and at increased risk.   Osteoporosis is a disease in which the bones lose minerals and strength with aging. This can result in serious bone fractures. The risk of osteoporosis can be identified using a bone density scan. Women ages 32 and over and women at risk for fractures or osteoporosis  should discuss screening with their caregivers. Ask your caregiver whether you should be taking a calcium supplement or vitamin D to reduce the rate of osteoporosis.   Menopause can be associated with physical symptoms and risks. Hormone replacement therapy is available to decrease symptoms and risks. You should talk to your caregiver about whether  hormone replacement therapy is right for you.   Use sunscreen with a sun protection factor (SPF) of 30 or greater. Apply sunscreen liberally and repeatedly throughout the day. You should seek shade when your shadow is shorter than you. Protect yourself by wearing long sleeves, pants, a wide-brimmed hat, and sunglasses year round, whenever you are outdoors.   Notify your caregiver of new moles or changes in moles, especially if there is a change in shape or color. Also notify your caregiver if a mole is larger than the size of a pencil eraser.   Stay current with your immunizations.  Document Released: 11/11/2010 Document Revised: 04/17/2011 Document Reviewed: 11/11/2010 [facility] Patient Information 2012 [place], [place].

## 2011-12-26 NOTE — Assessment & Plan Note (Signed)
Changed from Diovan to generic ARB 07/2010 - BP well controlled BP Readings from Last 3 Encounters:  12/26/11 114/78  11/28/11 142/98  07/04/11 120/84   The current medical regimen is effective;  continue present plan and medications.

## 2011-12-26 NOTE — Progress Notes (Signed)
Subjective:    Patient ID: Shannon Navarro, female    DOB: 05-20-1955, 56 y.o.   MRN: [id number]  HPI  patient is here today for annual physical. Patient feels well overall.  Concerned for ?sleep apnea - family notes snoring, pt reports near-sleep events while driving and daytime fatigue - bro with OSA  Past Medical History  Diagnosis Date  . Obesity, unspecified   . HYPERGLYCEMIA   . Migraine   . Shingles   . ALLERGIC RHINITIS   . ANXIETY   . HYPERTENSION   . Menopause     @ 48  . Pseudotumor cerebri 2007  . GERD 07/2004    hx food impaction   Family History  Problem Relation Age of Onset  . Breast cancer Mother    History  Substance Use Topics  . Smoking status: Never Smoker   . Smokeless tobacco: Not on file   Comment: Married lives with spouse + 2 daughters  . Alcohol Use: No   Review of Systems  Constitutional: Negative for fever.  Respiratory: Negative for cough and shortness of breath.   Cardiovascular: Negative for chest pain.  Gastrointestinal: Negative for abdominal pain.  Musculoskeletal: Negative for gait problem.  Skin: Negative for rash.  Neurological: Negative for dizziness.  No other specific complaints in a complete review of systems (except as listed in HPI above).     Objective:   Physical Exam  BP 114/78  Pulse 64  Temp 98.2 F (36.8 C) (Oral)  Ht 5\' 6"  (1.676 m)  Wt 248 lb (112.492 kg)  BMI 40.03 kg/m2  SpO2 97% BP Readings from Last 3 Encounters:  12/26/11 114/78  11/28/11 142/98  07/04/11 120/84   Wt Readings from Last 3 Encounters:  12/26/11 248 lb (112.492 kg)  11/28/11 248 lb 9.6 oz (112.764 kg)  12/20/10 242 lb 12.8 oz (110.133 kg)   Constitutional: She is overweight. She appears well-developed and well-nourished. No distress.  HENT: Head: Normocephalic and atraumatic. Ears: B TMs ok, no erythema or effusion; Nose: Nose normal.  Mouth/Throat: Oropharynx is clear and moist. No oropharyngeal exudate.  Eyes: Conjunctivae  and EOM are normal. Pupils are equal, round, and reactive to light. No scleral icterus.  Neck: Normal range of motion. Neck supple. No JVD present. No thyromegaly present.  Cardiovascular: Normal rate, regular rhythm and normal heart sounds.  No murmur heard. No BLE edema. Pulmonary/Chest: Effort normal and breath sounds normal. No respiratory distress. She has no wheezes.  Abdominal: Soft. Bowel sounds are normal. She exhibits no distension. There is no tenderness. no masses Musculoskeletal: Normal range of motion, no joint effusions. No gross deformities Neurological: She is alert and oriented to person, place, and time. No cranial nerve deficit. Coordination normal.  Skin: thickened toenails. Skin is warm and dry. No rash noted. No erythema.  Psychiatric: She has a normal mood and affect. Her behavior is normal. Judgment and thought content normal.   Lab Results  Component Value Date   WBC 5.8 12/22/2011   HGB 13.8 12/22/2011   HCT 42.3 12/22/2011   PLT 242.0 12/22/2011   CHOL 149 12/22/2011   TRIG 91.0 12/22/2011   HDL 41.70 12/22/2011   ALT 21 12/22/2011   AST 17 12/22/2011   NA 141 12/22/2011   K 4.2 12/22/2011   CL 107 12/22/2011   CREATININE 0.7 12/22/2011   BUN 15 12/22/2011   CO2 27 12/22/2011   TSH 1.97 12/22/2011   HGBA1C 5.9 12/17/2010   EKG: sinus @  59bpm - no change from 12/20/10 ekg     Assessment & Plan:  CPX - v70.0 - Patient has been counseled on age-appropriate routine health concerns for screening and prevention. These are reviewed and up-to-date. Immunizations are up-to-date or declined. Labs and ECG reviewed.  Hypersomnia and snoring -  FH OSA and personal body habitus to suggest same -  refer for sleep eval to determine best sleep study testing  Thickened toenails - fungal - PenLac  Also see problem list. Medications and labs reviewed today.

## 2012-01-16 ENCOUNTER — Ambulatory Visit (INDEPENDENT_AMBULATORY_CARE_PROVIDER_SITE_OTHER): Payer: BC Managed Care – PPO | Admitting: Pulmonary Disease

## 2012-01-16 ENCOUNTER — Encounter: Payer: Self-pay | Admitting: Pulmonary Disease

## 2012-01-16 VITALS — BP 134/86 | HR 75 | Temp 98.2°F | Ht 66.0 in | Wt 253.4 lb

## 2012-01-16 DIAGNOSIS — R0989 Other specified symptoms and signs involving the circulatory and respiratory systems: Secondary | ICD-10-CM

## 2012-01-16 DIAGNOSIS — R0683 Snoring: Secondary | ICD-10-CM

## 2012-01-16 DIAGNOSIS — G4733 Obstructive sleep apnea (adult) (pediatric): Secondary | ICD-10-CM | POA: Insufficient documentation

## 2012-01-16 NOTE — Assessment & Plan Note (Addendum)
She has snoring, daytime sleepiness, and witnessed apnea.  She has history of hypertension.  I am concerned she could have sleep apnea.  I have explained how sleep apnea can affect the patient's health.  Driving precautions and importance of weight loss were discussed.  Treatment options for sleep apnea were reviewed.  To further assess will arrange for in lab sleep study.

## 2012-01-16 NOTE — Progress Notes (Signed)
Chief Complaint  Patient presents with  . Sleep Consult    Referred by Dr. Felicity Coyer.  Feels like gasping for breath at night, daytime sleepiness, and snoring.     History of Present Illness: Shannon Navarro is a 56 y.o. female for evaluation of sleep apnea.  She has noticed problems with snoring for years.  She will wake up with a gasp, and this is happening more often.  She had a change in her work, and is driving more.  She is having trouble staying awake while driving.  Her brother has sleep apnea, and he told her that her breathing while asleep is similar to his before therapy for OSA.  She went on a recent trip with her mother, and was told that she had a lot of breathing trouble while asleep.  She goes to bed at 10 pm.  She falls asleep quickly.  She wakes up several times to use the bathroom.  She gets out of bed at 7 am.  She feels tired all day.  She will fall asleep easily if she is sitting quiet.  She does not use anything to help her sleep or stay awake.    She will get occasional leg symptoms, but this does not usually cause trouble with her sleep.  She will take tylenol and then her leg symptoms go away.  The patient denies sleep walking, sleep talking, bruxism, or nightmares.  The patient denies sleep hallucinations, sleep paralysis, or cataplexy.  Her Epworth score is 16 out of 24.  Past Medical History  Diagnosis Date  . Obesity, unspecified   . HYPERGLYCEMIA   . Migraine   . Shingles   . ALLERGIC RHINITIS   . ANXIETY   . HYPERTENSION   . Menopause     @ 48  . Pseudotumor cerebri 2007  . GERD 07/2004    hx food impaction    Past Surgical History  Procedure Date  . Pseudotumor cerebri in 2007  . Btl 1995  . Dilation and curettage of uterus 2010    Current Outpatient Prescriptions on File Prior to Visit  Medication Sig Dispense Refill  . cetirizine (ZYRTEC) 10 MG tablet Take 1 tablet (10 mg total) by mouth daily.  90 tablet  3  . ciclopirox (PENLAC) 8 %  solution Apply topically at bedtime. Apply over nail and surrounding skin. Apply daily over previous coat. After seven (7) days, may remove with alcohol and continue cycle.  6.6 mL  0  . losartan (COZAAR) 100 MG tablet TAKE 1 TABLET DAILY  90 tablet  1  . venlafaxine XR (EFFEXOR-XR) 75 MG 24 hr capsule TAKE 1 CAPSULE DAILY  90 capsule  1    Allergies  Allergen Reactions  . Cefaclor     REACTION: rash  . Codeine     REACTION: vomiting  . Hydrocodone     Severe vomiting  . Levofloxacin     REACTION: Nausea  . Penicillins     REACTION: Rash  . Sulfonamide Derivatives     REACTION: Rash    Family History  Problem Relation Age of Onset  . Breast cancer Mother   . Prostate cancer Father   . Aortic stenosis Father   . Asthma Mother   . Hypertension Mother   . Hypertension Father     History  Substance Use Topics  . Smoking status: Never Smoker   . Smokeless tobacco: Never Used   Comment: Married lives with spouse + 2 daughters  .  Alcohol Use: No    Review of Systems  Constitutional: Negative for fever and unexpected weight change.  HENT: Positive for sore throat and postnasal drip. Negative for ear pain, nosebleeds, congestion, rhinorrhea, sneezing, trouble swallowing, dental problem and sinus pressure.   Eyes: Negative for redness and itching.  Respiratory: Negative for cough, chest tightness, shortness of breath and wheezing.   Cardiovascular: Negative for palpitations and leg swelling.  Gastrointestinal: Negative for nausea and vomiting.  Genitourinary: Negative for dysuria.  Musculoskeletal: Negative for joint swelling.  Skin: Negative for rash.  Neurological: Negative for headaches.  Hematological: Does not bruise/bleed easily.  Psychiatric/Behavioral: Negative for dysphoric mood. The patient is not nervous/anxious.    Physical Exam: Filed Vitals:   01/16/12 1551  BP: 134/86  Pulse: 75  Temp: 98.2 F (36.8 C)  TempSrc: Oral  Height: 5\' 6"  (1.676 m)  Weight:  253 lb 6.4 oz (114.941 kg)  SpO2: 95%  ,  Current Encounter SPO2  01/16/12 1551 95%  12/26/11 0808 97%  11/28/11 0844 98%    Wt Readings from Last 3 Encounters:  01/16/12 253 lb 6.4 oz (114.941 kg)  12/26/11 248 lb (112.492 kg)  11/28/11 248 lb 9.6 oz (112.764 kg)    Body mass index is 40.90 kg/(m^2).   General - No distress ENT - TM clear, no sinus tenderness, no oral exudate, 3+ tonsils, no LAN, no thyromegaly Cardiac - s1s2 regular, no murmur, pulses symmetric, no edema Chest - normal respiratory excursion, good air entry, no wheeze/rales/dullness Back - no focal tenderness Abd - soft, non-tender, no organomegaly, + bowel sounds Ext - normal motor strength Neuro - Cranial nerves are normal. PERLA. EOM's intact. Skin - no discernible active dermatitis, erythema, urticaria or inflammatory process. Psych - normal mood, and behavior.   Lab Results  Component Value Date   WBC 5.8 12/22/2011   HGB 13.8 12/22/2011   HCT 42.3 12/22/2011   MCV 85.4 12/22/2011   PLT 242.0 12/22/2011    Lab Results  Component Value Date   CREATININE 0.7 12/22/2011   BUN 15 12/22/2011   NA 141 12/22/2011   K 4.2 12/22/2011   CL 107 12/22/2011   CO2 27 12/22/2011    Lab Results  Component Value Date   ALT 21 12/22/2011   AST 17 12/22/2011   ALKPHOS 65 12/22/2011   BILITOT 0.7 12/22/2011    Lab Results  Component Value Date   TSH 1.97 12/22/2011    Assessment/Plan:  Coralyn Helling, MD [facility] Pager:  [phone] 01/16/2012, 4:03 PM

## 2012-01-16 NOTE — Patient Instructions (Signed)
Will schedule sleep study Will call to schedule follow up after sleep study reviewed 

## 2012-01-16 NOTE — Progress Notes (Deleted)
  Subjective:    Patient ID: Shannon Navarro, female    DOB: 1955/10/04, 56 y.o.   MRN: [id number]  HPI    Review of Systems  Constitutional: Negative for fever and unexpected weight change.  HENT: Positive for sore throat and postnasal drip. Negative for ear pain, nosebleeds, congestion, rhinorrhea, sneezing, trouble swallowing, dental problem and sinus pressure.   Eyes: Negative for redness and itching.  Respiratory: Negative for cough, chest tightness, shortness of breath and wheezing.   Cardiovascular: Negative for palpitations and leg swelling.  Gastrointestinal: Negative for nausea and vomiting.  Genitourinary: Negative for dysuria.  Musculoskeletal: Negative for joint swelling.  Skin: Negative for rash.  Neurological: Negative for headaches.  Hematological: Does not bruise/bleed easily.  Psychiatric/Behavioral: Negative for dysphoric mood. The patient is not nervous/anxious.        Objective:   Physical Exam        Assessment & Plan:

## 2012-02-09 ENCOUNTER — Ambulatory Visit (HOSPITAL_BASED_OUTPATIENT_CLINIC_OR_DEPARTMENT_OTHER): Payer: BC Managed Care – PPO | Attending: Pulmonary Disease | Admitting: Radiology

## 2012-02-09 VITALS — Ht 66.0 in | Wt 245.0 lb

## 2012-02-09 DIAGNOSIS — I10 Essential (primary) hypertension: Secondary | ICD-10-CM | POA: Insufficient documentation

## 2012-02-09 DIAGNOSIS — G4733 Obstructive sleep apnea (adult) (pediatric): Secondary | ICD-10-CM | POA: Insufficient documentation

## 2012-02-09 DIAGNOSIS — Z9989 Dependence on other enabling machines and devices: Secondary | ICD-10-CM

## 2012-02-09 DIAGNOSIS — R0683 Snoring: Secondary | ICD-10-CM

## 2012-02-10 ENCOUNTER — Other Ambulatory Visit: Payer: Self-pay | Admitting: Internal Medicine

## 2012-02-11 ENCOUNTER — Other Ambulatory Visit: Payer: Self-pay

## 2012-02-11 MED ORDER — VENLAFAXINE HCL ER 75 MG PO CP24: 75.0000 mg | ORAL_CAPSULE | Freq: Every day | ORAL | Status: DC

## 2012-02-18 ENCOUNTER — Encounter: Payer: Self-pay | Admitting: Pulmonary Disease

## 2012-02-18 ENCOUNTER — Telehealth: Payer: Self-pay | Admitting: Pulmonary Disease

## 2012-02-18 DIAGNOSIS — G4733 Obstructive sleep apnea (adult) (pediatric): Secondary | ICD-10-CM

## 2012-02-18 NOTE — Telephone Encounter (Signed)
[id number] x1 to schedule appt 

## 2012-02-18 NOTE — Telephone Encounter (Signed)
Patient called back.  Patient scheduled to see VS 12/6 @ 4:30.  [facility]

## 2012-02-18 NOTE — Telephone Encounter (Signed)
PSG 02/09/12>>AHI 33.8, SpO2 low 82%, PLMI 21.7, CPAP 10 cm H2O>>+R, +S.  Results d/w pt.  Will proceed with CPAP set up at home (I have placed order).  Will have my nurse schedule ROV in 8 weeks after CPAP set up.

## 2012-02-19 ENCOUNTER — Telehealth: Payer: Self-pay | Admitting: Pulmonary Disease

## 2012-02-19 NOTE — Procedures (Signed)
NAME:  Shannon Navarro, Shannon Navarro NO.:  [id number]  MEDICAL RECORD NO.:  [id number]          PATIENT TYPE:  OUT  LOCATION:  SLEEP CENTER                 FACILITY:  [facility]  PHYSICIAN:  Coralyn Helling, MD        DATE OF BIRTH:  [birth year]  DATE OF STUDY:  02/09/2012                           NOCTURNAL POLYSOMNOGRAM  REFERRING PHYSICIAN:  Coralyn Helling, MD  REFERRING PHYSICIAN:  Coralyn Helling, MD  INDICATION:  Shannon Navarro is a 56 year old female, who has a history of hypertension.  She also reports snoring, sleep disruption, and daytime sleepiness and morning headaches.  She is referred to sleep lab for evaluation of hypersomnia with obstructive sleep apnea.  Height is 5 feet 6 inches, weight is 245 pounds.  BMI is 40, neck size is 15.5 inches.  MEDICATIONS:  Zyrtec, losartan, and venlafaxine.  EPWORTH SLEEPINESS SCORE:  14.  SLEEP ARCHITECTURE:  The patient followed a split night study protocol. During the diagnostic portion of the study, total recording time was 162 minutes.  Total sleep time was 133 minutes.  Sleep efficiency was 81%. Sleep latency was 9 minutes.  This portion of the study was notable for the lack of REM sleep and she slept predominantly in the nonsupine position.  During the titration portion of the study, total recording time was 255 minutes.  Total sleep time was 193 minutes, sleep efficiency was 75%. Sleep latency was 13 minutes.  REM latency 103 minutes.  This portion of study was notable for lack of slow-wave sleep and she slept predominantly in the nonsupine position.  RESPIRATORY DATA:  The average respiratory rate was 18.  Moderate snoring was noted by the technician.  During the diagnostic portion of study, the overall apnea/hypopnea index was 33.8.  There were 6 mixed respiratory events.  The remainder of the events were obstructive in nature.  During the titration portion of the study, the patient was started on CPAP of 5 and increased to 14  cm water.  With CPAP set at 10 cm of water, it appeared she had good control of the sleep-disordered breathing, with improvement in her sleep architecture.  OXYGEN DATA:  The baseline oxygenation was 90%.  The oxygen saturation nadir was 82%.  The patient had good control of her oxygenation with CPAP at 10 cm water.  The study was conducted without the use of supplemental oxygen.  CARDIAC DATA:  The average heart rate was 66 and the rhythm strip showed sinus rhythm with occasional PVCs.  MOVEMENT/PARASOMNIA:  The patient had 1 restroom trip.  The periodic limb movement index was 21.7 during the diagnostic portion of the study, but 0 during the titration portion of study.  IMPRESSION:  This study showed evidence for severe obstructive sleep apnea with an apnea/hypopnea index of 33.8 and oxygen saturation nadir of 82%.  She had good control of her sleep-disordered breathing with CPAP at 10 cm water.  She was fitted with a Resmed Mirage Quattro medium size full face mask.  In addition to diet, exercise, and weight reduction, I would recommend the patient be started on CPAP at 10 cm water and monitored for her clinical response.     Chyenne Sobczak  Craige Cotta, MD Diplomat, American Board of Sleep Medicine    VS/MEDQ  D:  02/18/2012 13:41:51  T:  02/19/2012 05:03:56  Job:  [id number]

## 2012-02-19 NOTE — Telephone Encounter (Signed)
Received info from [employer] this had been forwarded to [facility] Tobe Sos

## 2012-02-20 NOTE — Telephone Encounter (Signed)
Almyra Free was the information that was sent a sleep study? Please advise. If so then we can close this note. Thanks. Carron Curie, CMA

## 2012-02-20 NOTE — Telephone Encounter (Signed)
Patient states Shannon Navarro can not proceed with cpap order because sleep study is not signed.

## 2012-02-20 NOTE — Telephone Encounter (Signed)
refaxed copy of sign npsg to [email or web address]  Tobe Sos

## 2012-03-03 ENCOUNTER — Ambulatory Visit (INDEPENDENT_AMBULATORY_CARE_PROVIDER_SITE_OTHER): Payer: BC Managed Care – PPO

## 2012-03-03 DIAGNOSIS — Z23 Encounter for immunization: Secondary | ICD-10-CM

## 2012-03-12 LAB — HM PAP SMEAR

## 2012-04-16 ENCOUNTER — Encounter: Payer: Self-pay | Admitting: Pulmonary Disease

## 2012-04-16 ENCOUNTER — Ambulatory Visit (INDEPENDENT_AMBULATORY_CARE_PROVIDER_SITE_OTHER): Payer: BC Managed Care – PPO | Admitting: Pulmonary Disease

## 2012-04-16 VITALS — BP 130/84 | HR 61 | Temp 97.2°F | Ht 66.0 in | Wt 259.2 lb

## 2012-04-16 DIAGNOSIS — G4733 Obstructive sleep apnea (adult) (pediatric): Secondary | ICD-10-CM

## 2012-04-16 NOTE — Assessment & Plan Note (Signed)
She has done well with CPAP.  She reports compliance and clinical benefit.  He main difficulty is with her mask fit.  Will have her DME re-assess her mask fit.  Will get her CPAP download, and call her with results.

## 2012-04-16 NOTE — Progress Notes (Signed)
  Chief Complaint  Patient presents with  . Follow-up    Wearing CPAP 8-9 hrs per night, tolerating pressures well--Pt reports mask is fitting too big, pushing again face    History of Present Illness: Shannon Navarro is a 56 y.o. female with OSA.  Since her last visit she has been set up with CPAP.  She is sleeping much better, and feeling more rested.  She uses her machine every night.  Her only problem is with her mask fit.  She has a full face mask.  She thinks the mask is too big.  The mask tends to leak around her eyes and her chin.  She has not tried other mask types.  Tests: PSG 02/09/12>>AHI 33.8, SpO2 low 82%, PLMI 21.7, CPAP 10 cm H2O>>+R, +S.  Past Medical History  Diagnosis Date  . Obesity, unspecified   . HYPERGLYCEMIA   . Migraine   . Shingles   . ALLERGIC RHINITIS   . ANXIETY   . HYPERTENSION   . Menopause     @ 48  . Pseudotumor cerebri 2007  . GERD 07/2004    hx food impaction  . OSA (obstructive sleep apnea) 01/16/2012    Past Surgical History  Procedure Date  . Pseudotumor cerebri in 2007  . Btl 1995  . Dilation and curettage of uterus 2010    Current Outpatient Prescriptions on File Prior to Visit  Medication Sig Dispense Refill  . cetirizine (ZYRTEC) 10 MG tablet Take 1 tablet (10 mg total) by mouth daily.  90 tablet  3  . losartan (COZAAR) 100 MG tablet TAKE 1 TABLET DAILY  90 tablet  3  . venlafaxine XR (EFFEXOR-XR) 75 MG 24 hr capsule Take 1 capsule (75 mg total) by mouth daily.  90 capsule  1  . guaifenesin (HUMIBID E) 400 MG TABS Take 200 mg by mouth every 4 (four) hours.        Allergies  Allergen Reactions  . Cefaclor     REACTION: rash  . Codeine     REACTION: vomiting  . Hydrocodone     Severe vomiting  . Levofloxacin     REACTION: Nausea  . Penicillins     REACTION: Rash  . Sulfonamide Derivatives     REACTION: Rash    Physical Exam: Filed Vitals:   04/16/12 1633  BP: 130/84  Pulse: 61  Temp: 97.2 F (36.2 C)   TempSrc: Oral  Height: 5\' 6"  (1.676 m)  Weight: 259 lb 3.2 oz (117.572 kg)  SpO2: 97%    Current Encounter SPO2  04/16/12 1633 97%  01/16/12 1551 95%  12/26/11 0808 97%    Wt Readings from Last 3 Encounters:  04/16/12 259 lb 3.2 oz (117.572 kg)  02/09/12 245 lb (111.131 kg)  01/16/12 253 lb 6.4 oz (114.941 kg)    Body mass index is 41.84 kg/(m^2).   General - No distress  ENT - No sinus tenderness, no oral exudate, 3+ tonsils, no LAN Cardiac - s1s2 regular, no murmur Chest - normal respiratory excursion, good air entry, no wheeze/rales/dullness  Back - no focal tenderness  Abd - soft, non-tender Ext - no edema Neuro - normal strength  Skin - no rashes Psych - normal mood, and behavior  Assessment/Plan:  Coralyn Helling, MD [facility] Pager:  [phone] 04/16/2012, 4:37 PM

## 2012-04-16 NOTE — Patient Instructions (Signed)
Will arrange for CPAP mask re-fit Will get report from CPAP machine and call with results Follow up in 1 year

## 2012-05-06 ENCOUNTER — Telehealth: Payer: Self-pay | Admitting: Pulmonary Disease

## 2012-05-06 NOTE — Telephone Encounter (Signed)
CPAP 02/25/12 to 04/23/12 >> Used on 59 of 59 nights with average 7 hrs 53 min.  Average AHI 5.5 with CPAP 10 cm H2O.  Will have my nurse inform pt that CPAP report looked very good.  No change to current set up.

## 2012-05-10 NOTE — Telephone Encounter (Signed)
lmomtcb x1 

## 2012-05-11 NOTE — Telephone Encounter (Signed)
lmomtcb x2 on home/cell #

## 2012-05-13 NOTE — Telephone Encounter (Signed)
I spoke with patient about results and she verbalized understanding and had no questions 

## 2012-07-02 ENCOUNTER — Ambulatory Visit: Payer: BC Managed Care – PPO | Admitting: Internal Medicine

## 2012-08-23 ENCOUNTER — Encounter: Payer: Self-pay | Admitting: Internal Medicine

## 2012-08-23 ENCOUNTER — Ambulatory Visit (INDEPENDENT_AMBULATORY_CARE_PROVIDER_SITE_OTHER): Payer: BC Managed Care – PPO | Admitting: Internal Medicine

## 2012-08-23 VITALS — BP 112/72 | HR 57 | Temp 97.7°F | Wt 254.0 lb

## 2012-08-23 DIAGNOSIS — I10 Essential (primary) hypertension: Secondary | ICD-10-CM

## 2012-08-23 DIAGNOSIS — J309 Allergic rhinitis, unspecified: Secondary | ICD-10-CM

## 2012-08-23 DIAGNOSIS — E669 Obesity, unspecified: Secondary | ICD-10-CM

## 2012-08-23 DIAGNOSIS — G4733 Obstructive sleep apnea (adult) (pediatric): Secondary | ICD-10-CM

## 2012-08-23 MED ORDER — FLUTICASONE PROPIONATE 50 MCG/ACT NA SUSP: 2.0000 | Freq: Every day | NASAL | Status: DC

## 2012-08-23 MED ORDER — LORATADINE 10 MG PO TABS: 10.0000 mg | ORAL_TABLET | Freq: Every day | ORAL | Status: DC | PRN

## 2012-08-23 MED ORDER — PHENYLEPHRINE HCL 10 MG PO TABS: 10.0000 mg | ORAL_TABLET | ORAL | Status: DC | PRN

## 2012-08-23 MED ORDER — VENLAFAXINE HCL ER 75 MG PO CP24: 75.0000 mg | ORAL_CAPSULE | Freq: Every day | ORAL | Status: DC

## 2012-08-23 NOTE — Progress Notes (Signed)
  Subjective:    Patient ID: Shannon Navarro, female    DOB: [birth year], 57 y.o.   MRN: [id number]  HPI  Here for follow up   Complains of increased allergy and sinus symptoms over past week Taking over-the-counter medications without improvement No fever, no discolored discharge Associated with mild frontal headache which is improved with Tylenol  Past Medical History  Diagnosis Date  . Obesity, unspecified   . HYPERGLYCEMIA   . Migraine   . Shingles   . ALLERGIC RHINITIS   . ANXIETY   . HYPERTENSION   . Menopause     @ 48  . Pseudotumor cerebri 2007  . GERD 07/2004    hx food impaction  . OSA (obstructive sleep apnea) 01/16/2012    started CPAP 02/2012   Review of Systems  Respiratory: Negative for cough and shortness of breath.   Cardiovascular: Negative for chest pain or palpitations.     Objective:   Physical Exam  BP 112/72  Pulse 57  Temp(Src) 97.7 F (36.5 C) (Oral)  Wt 254 lb (115.214 kg)  BMI 41.02 kg/m2  SpO2 98%  Wt Readings from Last 3 Encounters:  08/23/12 254 lb (115.214 kg)  04/16/12 259 lb 3.2 oz (117.572 kg)  02/09/12 245 lb (111.131 kg)   Constitutional: She is overweight. She appears well-developed and well-nourished. No distress.   HENT: mild tenderness to palpation over right frontal sinus -normocephalic and atraumatic. Nares with pale turbinates, clear discharge. No purulence or polyp. Her pharynx with mild erythema and clear PND, no exudate. Bilaterally enlarged tonsils without change Neck: Normal range of motion. Neck supple. No JVD or LAD present. No thyromegaly present.  Cardiovascular: Normal rate, regular rhythm and normal heart sounds.  No murmur heard. No BLE edema. Pulmonary/Chest: Effort normal and breath sounds normal. No respiratory distress. She has no wheezes. Psychiatric: She has a normal mood and affect. Her behavior is normal. Judgment and thought content normal.   Lab Results  Component Value Date   WBC 5.8 12/22/2011    HGB 13.8 12/22/2011   HCT 42.3 12/22/2011   PLT 242.0 12/22/2011   CHOL 149 12/22/2011   TRIG 91.0 12/22/2011   HDL 41.70 12/22/2011   ALT 21 12/22/2011   AST 17 12/22/2011   NA 141 12/22/2011   K 4.2 12/22/2011   CL 107 12/22/2011   CREATININE 0.7 12/22/2011   BUN 15 12/22/2011   CO2 27 12/22/2011   TSH 1.97 12/22/2011   HGBA1C 5.9 12/17/2010       Assessment & Plan:   see problem list. Medications and labs reviewed today.  Allergic sinusitis. Change antihistamine and start nasal steroid (new erx done). Also okay for when necessary short-term over-the-counter decongestant such as Sudafed. No evidence for infection at this time -but the patient call if symptoms worse or unimproved with medical conservative therapy

## 2012-08-23 NOTE — Assessment & Plan Note (Signed)
Wt Readings from Last 3 Encounters:  08/23/12 254 lb (115.214 kg)  04/16/12 259 lb 3.2 oz (117.572 kg)  02/09/12 245 lb (111.131 kg)  has begun [employer] program for weight loss 06/2012 -encouragement and support offered today The patient is asked to make an attempt to improve diet and exercise patterns to aid in medical management of this problem.

## 2012-08-23 NOTE — Assessment & Plan Note (Signed)
Diagnosis clarified with sleep study 02/09/2012 -pulmonary evaluation for same reviewed Begin CPAP October 2013, symptoms much improved The current medical regimen is effective;  continue present plan and medications.  Encourage work on weight loss efforts as ongoing 

## 2012-08-23 NOTE — Assessment & Plan Note (Signed)
Changed from Diovan to generic ARB 07/2010 - BP well controlled BP Readings from Last 3 Encounters:  08/23/12 112/72  04/16/12 130/84  01/16/12 134/86   The current medical regimen is effective;  continue present plan and medications.

## 2012-08-23 NOTE — Patient Instructions (Signed)
It was good to see you today. We have reviewed your prior records including labs and tests today Change Zyrtec to Claritin once daily and start Flonase allergy nose spray for sinus allergy symptoms Your prescription(s) have been submitted to your pharmacy. Please take as directed and contact our office if you believe you are having problem(s) with the medication(s). Okay to use Sudafed PE for congestion symptoms as needed ( over-the-counter) Use Advil orTylenol and salt water gargles as described; keep hydrated and rest when able Good luck with Nutrisystem. Call us if assistance is needed Please schedule followup in 6 months For physical and labs, call sooner if problems.

## 2012-10-07 ENCOUNTER — Emergency Department (HOSPITAL_COMMUNITY)
Admission: EM | Admit: 2012-10-07 | Discharge: 2012-10-07 | Disposition: A | Discharge status: Home / Self Care | Payer: BC Managed Care – PPO | Attending: Emergency Medicine | Admitting: Emergency Medicine

## 2012-10-07 ENCOUNTER — Encounter: Payer: Self-pay | Admitting: Internal Medicine

## 2012-10-07 ENCOUNTER — Encounter (HOSPITAL_COMMUNITY): Payer: Self-pay | Admitting: *Deleted

## 2012-10-07 ENCOUNTER — Emergency Department (HOSPITAL_COMMUNITY): Payer: BC Managed Care – PPO

## 2012-10-07 DIAGNOSIS — Z8639 Personal history of other endocrine, nutritional and metabolic disease: Secondary | ICD-10-CM | POA: Insufficient documentation

## 2012-10-07 DIAGNOSIS — Z8679 Personal history of other diseases of the circulatory system: Secondary | ICD-10-CM | POA: Insufficient documentation

## 2012-10-07 DIAGNOSIS — G4733 Obstructive sleep apnea (adult) (pediatric): Secondary | ICD-10-CM | POA: Insufficient documentation

## 2012-10-07 DIAGNOSIS — E669 Obesity, unspecified: Secondary | ICD-10-CM | POA: Insufficient documentation

## 2012-10-07 DIAGNOSIS — R112 Nausea with vomiting, unspecified: Secondary | ICD-10-CM | POA: Insufficient documentation

## 2012-10-07 DIAGNOSIS — Z8719 Personal history of other diseases of the digestive system: Secondary | ICD-10-CM | POA: Insufficient documentation

## 2012-10-07 DIAGNOSIS — I10 Essential (primary) hypertension: Secondary | ICD-10-CM | POA: Insufficient documentation

## 2012-10-07 DIAGNOSIS — Z79899 Other long term (current) drug therapy: Secondary | ICD-10-CM | POA: Insufficient documentation

## 2012-10-07 DIAGNOSIS — Z880 Allergy status to penicillin: Secondary | ICD-10-CM | POA: Insufficient documentation

## 2012-10-07 DIAGNOSIS — Z8659 Personal history of other mental and behavioral disorders: Secondary | ICD-10-CM | POA: Insufficient documentation

## 2012-10-07 DIAGNOSIS — Z862 Personal history of diseases of the blood and blood-forming organs and certain disorders involving the immune mechanism: Secondary | ICD-10-CM | POA: Insufficient documentation

## 2012-10-07 DIAGNOSIS — Z780 Asymptomatic menopausal state: Secondary | ICD-10-CM | POA: Insufficient documentation

## 2012-10-07 DIAGNOSIS — Z8619 Personal history of other infectious and parasitic diseases: Secondary | ICD-10-CM | POA: Insufficient documentation

## 2012-10-07 DIAGNOSIS — Z8669 Personal history of other diseases of the nervous system and sense organs: Secondary | ICD-10-CM | POA: Insufficient documentation

## 2012-10-07 DIAGNOSIS — Z9981 Dependence on supplemental oxygen: Secondary | ICD-10-CM | POA: Insufficient documentation

## 2012-10-07 DIAGNOSIS — R1032 Left lower quadrant pain: Secondary | ICD-10-CM

## 2012-10-07 DIAGNOSIS — Z9889 Other specified postprocedural states: Secondary | ICD-10-CM | POA: Insufficient documentation

## 2012-10-07 LAB — CBC WITH DIFFERENTIAL/PLATELET
Basophils Absolute: 0 10*3/uL (ref 0.0–0.1)
Basophils Relative: 0 % (ref 0–1)
MCHC: 32.8 g/dL (ref 30.0–36.0)
Monocytes Absolute: 0.5 10*3/uL (ref 0.1–1.0)
Neutro Abs: 3 10*3/uL (ref 1.7–7.7)
Neutrophils Relative %: 60 % (ref 43–77)
RDW: 13 % (ref 11.5–15.5)

## 2012-10-07 LAB — URINE MICROSCOPIC-ADD ON

## 2012-10-07 LAB — COMPREHENSIVE METABOLIC PANEL
ALT: 16 U/L (ref 0–35)
Alkaline Phosphatase: 83 U/L (ref 39–117)
CO2: 28 mEq/L (ref 19–32)
GFR calc Af Amer: >90 mL/min (ref >90)
GFR calc non Af Amer: >90 mL/min (ref >90)
Glucose, Bld: 126 mg/dL — ABNORMAL HIGH (ref 70–99)
Potassium: 4.1 mEq/L (ref 3.5–5.1)
Sodium: 141 mEq/L (ref 135–145)

## 2012-10-07 LAB — ROCKY MTN SPOTTED FVR AB, IGG-BLOOD: RMSF IgG: 0.65 IV

## 2012-10-07 LAB — URINALYSIS, ROUTINE W REFLEX MICROSCOPIC
Glucose, UA: NEGATIVE mg/dL
Protein, ur: NEGATIVE mg/dL
Specific Gravity, Urine: 1.018 (ref 1.005–1.030)

## 2012-10-07 LAB — ROCKY MTN SPOTTED FVR AB, IGM-BLOOD: RMSF IgM: 0.46 IV (ref 0.00–0.89)

## 2012-10-07 MED ORDER — DICYCLOMINE HCL 20 MG PO TABS: 20.0000 mg | ORAL_TABLET | Freq: Two times a day (BID) | ORAL | Status: DC | PRN

## 2012-10-07 MED ORDER — ONDANSETRON 4 MG PO TBDP: 4.0000 mg | ORAL_TABLET | Freq: Three times a day (TID) | ORAL | Status: DC | PRN

## 2012-10-07 MED ORDER — SIMETHICONE 80 MG PO CHEW
80.0000 mg | CHEWABLE_TABLET | Freq: Once | ORAL | Status: AC
  Administered 2012-10-07: 80 mg via ORAL
  Filled 2012-10-07: qty 1

## 2012-10-07 MED ORDER — IOHEXOL 300 MG/ML  SOLN
50.0000 mL | Freq: Once | INTRAMUSCULAR | Status: AC | PRN
  Administered 2012-10-07: 50 mL via ORAL

## 2012-10-07 MED ORDER — IOHEXOL 300 MG/ML  SOLN
100.0000 mL | Freq: Once | INTRAMUSCULAR | Status: AC | PRN
  Administered 2012-10-07: 100 mL via INTRAVENOUS

## 2012-10-07 NOTE — ED Notes (Signed)
Patient transported to CT 

## 2012-10-07 NOTE — ED Provider Notes (Signed)
History     CSN: [id number]  Arrival date & time 10/07/12  0605   None     Chief Complaint  Patient presents with  . Abdominal Pain    (Consider location/radiation/quality/duration/timing/severity/associated sxs/prior treatment) HPI Comments: Pt presents to the ED for intermittent LLQ pain over the past week.  Pain described as sharp, non-radiating, and associated with nausea and vomiting which has resolved at this time.  Pain progressed throughout the night and upon arriving to the ED, pt passed a lot of gas which provided some relief.  Pt notes she ate peanuts last friday while out to dinner with her husband- initially thought the peanuts were making her sick.  Colonoscopy at age 66, which was negative.  No hx of diverticulosis.  Denies any dysuria, hematuria, increased urinary frequency, or vaginal discharge.  Has not started any new medications.  BM normal, non-bloody.  No diarrhea or recent abx use.  Has continued eating and drinking normally.  No fevers or sweats but endorses some chills.  No chest pain or SOB.  Pt is postmenopausal.  Pt notes a recent tick bite several weeks ago to her right outer thigh.  She thinks it was embedded in her skin for quite some time before she removed it.  No formal medical evaluation or treatment after this.  No rashes, flu like sx, or arthralgias.  Requests testing RMSF and Lyme disease.  The history is provided by the patient.    Past Medical History  Diagnosis Date  . Obesity, unspecified   . HYPERGLYCEMIA   . Migraine   . Shingles   . ALLERGIC RHINITIS   . ANXIETY   . HYPERTENSION   . Menopause     @ 48  . Pseudotumor cerebri 2007  . GERD 07/2004    hx food impaction  . OSA (obstructive sleep apnea) 01/16/2012    started CPAP 02/2012    Past Surgical History  Procedure Laterality Date  . Pseudotumor cerebri  in 2007  . Btl  1995  . Dilation and curettage of uterus  2010    Family History  Problem Relation Age of Onset  . Breast  cancer Mother   . Prostate cancer Father   . Aortic stenosis Father   . Asthma Mother   . Hypertension Mother   . Hypertension Father     History  Substance Use Topics  . Smoking status: Never Smoker   . Smokeless tobacco: Never Used     Comment: Married lives with spouse + 2 daughters  . Alcohol Use: No    OB History   Grav Para Term Preterm Abortions TAB SAB Ect Mult Living                  Review of Systems  Gastrointestinal: Positive for nausea, vomiting and abdominal pain.  All other systems reviewed and are negative.    Allergies  Cefaclor; Codeine; Hydrocodone; Levofloxacin; Penicillins; and Sulfonamide derivatives  Home Medications   Current Outpatient Rx  Name  Route  Sig  Dispense  Refill  . ibuprofen (ADVIL,MOTRIN) 200 MG tablet   Oral   Take 400 mg by mouth every 6 (six) hours as needed for pain.         Marland Kitchen loratadine (CLARITIN) 10 MG tablet   Oral   Take 1 tablet (10 mg total) by mouth daily as needed for allergies.   30 tablet   2   . losartan (COZAAR) 100 MG tablet   Oral  Take 100 mg by mouth daily.         Marland Kitchen venlafaxine XR (EFFEXOR-XR) 75 MG 24 hr capsule   Oral   Take 1 capsule (75 mg total) by mouth daily.   90 capsule   1     BP 157/76  Pulse 57  Temp(Src) 97.7 F (36.5 C) (Oral)  Resp 18  SpO2 98%  Physical Exam  Nursing note and vitals reviewed. Constitutional: She is oriented to person, place, and time. She appears well-developed and well-nourished. No distress.  HENT:  Head: Normocephalic and atraumatic.  Mouth/Throat: Uvula is midline, oropharynx is clear and moist and mucous membranes are normal.  Eyes: Conjunctivae and EOM are normal.  Neck: Normal range of motion. Neck supple.  Cardiovascular: Normal rate, regular rhythm and normal heart sounds.   Pulmonary/Chest: Effort normal and breath sounds normal. No respiratory distress.  Abdominal: Soft. Bowel sounds are normal. There is tenderness in the left lower  quadrant. There is no CVA tenderness, no tenderness at McBurney's point and negative Murphy's sign.  Musculoskeletal: Normal range of motion.  Neurological: She is alert and oriented to person, place, and time.  Skin: Skin is warm and dry. No rash noted.  Psychiatric: She has a normal mood and affect.    ED Course  Procedures (including critical care time)  Labs Reviewed  URINALYSIS, ROUTINE W REFLEX MICROSCOPIC - Abnormal; Notable for the following:    Hgb urine dipstick TRACE (*)    All other components within normal limits  COMPREHENSIVE METABOLIC PANEL - Abnormal; Notable for the following:    Glucose, Bld 126 (*)    Albumin 3.3 (*)    All other components within normal limits  CBC WITH DIFFERENTIAL  URINE MICROSCOPIC-ADD ON  B. BURGDORFI ANTIBODIES  ROCKY MTN SPOTTED FVR AB, IGG-BLOOD  ROCKY MTN SPOTTED FVR AB, IGM-BLOOD   Ct Abdomen Pelvis W Contrast  10/07/2012   *RADIOLOGY REPORT*  Clinical Data: Left lower quadrant abdominal pain, nausea and vomiting for the past week.  CT ABDOMEN AND PELVIS WITH CONTRAST  Technique:  Multidetector CT imaging of the abdomen and pelvis was performed following the standard protocol during bolus administration of intravenous contrast.  Contrast: 50mL OMNIPAQUE IOHEXOL 300 MG/ML  SOLN, OMNIPAQUE IOHEXOL 300 MG/ML  SOLN  Comparison: CT of the abdomen and pelvis 06/14/2007.  Findings:  Lung Bases: Small hiatal hernia.  Abdomen/Pelvis:  In the proximal aspect of the stomach at and immediately below the level of the small hiatal hernia and there is a focal area of asymmetric soft tissue thickening best demonstrated on image 15 of series 5.  Whether not this simply represents collapsed normal soft tissue, or could be neoplastic in origin is uncertain on today's examination.  The appearance of the liver, gallbladder, pancreas, spleen, bilateral adrenal glands and bilateral kidneys is unremarkable.  No significant volume of ascites, no pneumoperitoneum  and no pathologic distension of small bowel.  Normal appendix.  No definite pathologic lymphadenopathy identified within the abdomen or pelvis.  Uterus and ovaries are unremarkable in appearance.  Urinary bladder is normal in appearance.  Musculoskeletal: There are no aggressive appearing lytic or blastic lesions noted in the visualized portions of the skeleton.  IMPRESSION: 1.  No acute findings in the abdomen or pelvis to account for the patient's symptoms. 2.  There is a small hiatal hernia.  In addition, at and immediately below the level of the hernia and there is some asymmetric wall thickening in the proximal stomach  along the greater curvature.  While this simply represents normal collapsed soft tissue, the possibility of an underlying neoplasm is not excluded. Nonrgent endoscopic is recommended to exclude neoplasm.   Original Report Authenticated By: Trudie Reed, M.D.     1. LLQ abdominal pain       MDM   57 y.o. F presenting to the ED for intermittent, sharp, non-radiating, LLQ abdominal pain x 1 week.  No hx of diverticulosis.  Colonoscopy at age 69 which was normal.  N/V resolved at this time.  Gas x given in the ED with some relief of sx.  CT scan negative for acute colonic findings- incidental finding of asymmetric wall thickening in proximal stomach along greater curvature.  Recommended non-emergent GI FU for endoscopy.  Pt afebrile, non-toxic appearing, NAD, VS stable- ok for d/c.  Pt is already established with [facility]- she will call and schedule an appt today.  Rx zofran and bentyl.  Instructed she may continue OTC gas-x PRN.  Discussed plan with pt, she agreed.  Return precautions advised.       Garlon Hatchet, PA-C 10/07/12 1045

## 2012-10-07 NOTE — ED Notes (Signed)
Pt states that she has had LLQ abd pain off and on x 1 week; pt states that she has N/V over the weekend nut resolved on Monday; Pt states that the pain returned last pm and has progressed thru the night; pt states hurts more with movement and feels like she has a lot of gas pressure.

## 2012-10-07 NOTE — ED Provider Notes (Signed)
Medical screening examination/treatment/procedure(s) were performed by non-physician practitioner and as supervising physician I was immediately available for consultation/collaboration.    Kallen Delatorre R Yuritza Paulhus, MD 10/07/12 1612 

## 2012-10-27 ENCOUNTER — Telehealth: Payer: Self-pay | Admitting: Pulmonary Disease

## 2012-10-27 NOTE — Telephone Encounter (Signed)
(  continued) will bill primary & secondary insurances for supplies?  Antionette Fairy

## 2012-10-27 NOTE — Telephone Encounter (Signed)
lmtcb Sally E Ottinger ° °

## 2012-10-29 NOTE — Telephone Encounter (Signed)
i am not finding any other dme that is requesting this i will call [email or web address]  on Monday when she is back in the office to discuss this with her Tobe Sos

## 2012-11-01 NOTE — Telephone Encounter (Signed)
Spoke to [email or web address]  about this and she is going to ck on it and call med back on Tuesday 11/02/12 Shannon Navarro

## 2012-11-02 ENCOUNTER — Encounter: Payer: Self-pay | Admitting: *Deleted

## 2012-11-02 NOTE — Telephone Encounter (Signed)
Spoke to carol [facility] will go ahead and bill both insurances and if there is a problem pt will be notified unable to reach pt lmom Shannon Navarro

## 2012-11-08 ENCOUNTER — Other Ambulatory Visit: Payer: Self-pay

## 2012-11-08 DIAGNOSIS — Z1231 Encounter for screening mammogram for malignant neoplasm of breast: Secondary | ICD-10-CM

## 2012-11-23 ENCOUNTER — Encounter: Payer: Self-pay | Admitting: Internal Medicine

## 2012-11-23 ENCOUNTER — Ambulatory Visit (INDEPENDENT_AMBULATORY_CARE_PROVIDER_SITE_OTHER): Payer: BC Managed Care – PPO | Admitting: Internal Medicine

## 2012-11-23 VITALS — BP 110/70 | HR 60 | Ht 66.0 in | Wt 259.0 lb

## 2012-11-23 DIAGNOSIS — K222 Esophageal obstruction: Secondary | ICD-10-CM

## 2012-11-23 DIAGNOSIS — K219 Gastro-esophageal reflux disease without esophagitis: Secondary | ICD-10-CM

## 2012-11-23 DIAGNOSIS — R1319 Other dysphagia: Secondary | ICD-10-CM

## 2012-11-23 MED ORDER — RANITIDINE HCL 300 MG PO TABS: 300.0000 mg | ORAL_TABLET | Freq: Every day | ORAL | Status: DC

## 2012-11-23 NOTE — Patient Instructions (Addendum)
You have been scheduled for an endoscopy with propofol. Please follow written instructions given to you at your visit today. If you use inhalers (even only as needed), please bring them with you on the day of your procedure. Your physician has requested that you go to www.startemmi.com and enter the access code given to you at your visit today. This web site gives a general overview about your procedure. However, you should still follow specific instructions given to you by our office regarding your preparation for the procedure.  We have sent the following medications to your pharmacy for you to pick up at your convenience: Ranitidine  CC: Dr Rene Paci

## 2012-11-23 NOTE — Progress Notes (Signed)
Shannon Navarro [birth year] MRN [id number]  History of Present Illness:  This is a 57 year old white female who was seen in the emergency room several weeks ago with nausea, vomiting and dysphagia. She was also having left lower quadrant abdominal pain. She has been having progressive solid food dysphagia. There is a history of a benign distal esophageal stricture in June 2006 which was dilated with 15, 16 and 17 mm Savary dilators. She had relief of dysphagia for several years until recently. She had a normal screening colonoscopy in June 2010. Patient has obesity and obstructive sleep apnea. Since she has been on a CPAP, her reflux symptoms  have improved. She has intolerance to Nexium and currently is not taking any acid reducing medications. She sleeps on 2 pillows. An upper abdominal ultrasound in February 2009 showed a normal gallbladder.   Past Medical History  Diagnosis Date  . Obesity, unspecified   . HYPERGLYCEMIA   . Migraine   . Shingles   . ALLERGIC RHINITIS   . ANXIETY   . HYPERTENSION   . Menopause     @ 48  . Pseudotumor cerebri 2007  . GERD 07/2004    hx food impaction  . OSA (obstructive sleep apnea) 01/16/2012    started CPAP 02/2012  . Hiatal hernia   . Fatty liver 06/23/07   Past Surgical History  Procedure Laterality Date  . Pseudotumor cerebri  in 2007  . Btl  1995  . Dilation and curettage of uterus  2010    reports that she has never smoked. She has never used smokeless tobacco. She reports that she does not drink alcohol or use illicit drugs. family history includes Aortic stenosis in her father; Asthma in her mother; Breast cancer in her mother; Colon polyps in her father; Diabetes in her father; Heart disease in her father; Hypertension in her father and mother; and Prostate cancer in her father.  There is no history of Colon cancer. Allergies  Allergen Reactions  . Cefaclor     REACTION: rash  . Codeine     REACTION: vomiting  . Hydrocodone      Severe vomiting  . Levofloxacin     REACTION: Nausea  . Nexium (Esomeprazole Magnesium)     Made legs ache  . Penicillins     REACTION: Rash  . Sulfonamide Derivatives     REACTION: Rash        Review of Systems: Solid food dysphagia. Heartburn epigastric discomfort  The remainder of the 10 point ROS is negative except as outlined in H&P   Physical Exam: General appearance  Well developed, in no distress. Overweight Eyes- non icteric. HEENT nontraumatic, normocephalic. Normal voice no cough Mouth no lesions, tongue papillated, no cheilosis. Neck supple without adenopathy, thyroid not enlarged, no carotid bruits, no JVD. Lungs Clear to auscultation bilaterally. Cor normal S1, normal S2, regular rhythm, no murmur,  quiet precordium. Abdomen: Obese, soft with minimal tenderness in the subxiphoid area in midline. Normoactive bowel sounds. No CVA tenderness.  Rectal: Not done. Extremities no pedal edema. Skin no lesions. Neurological alert and oriented x 3. Psychological normal mood and affect.  Assessment and Plan:  Problem #80 57 year old white female with a history of benign esophageal stricture and recurrent solid food dysphagia. She is not taking any acid reducing medications. We will schedule her for an upper endoscopy with dilatation and start her on ranitidine 300 mg a day since she seems to be intolerant to PPIs. We have discussed head of  the bed elevation and antireflux measures.  Problem #2 Colorectal screening. She is up-to-date on her colonoscopy. Her next exam will be in June 2020.   11/23/2012 Lina Sar

## 2012-11-24 ENCOUNTER — Encounter: Payer: Self-pay | Admitting: Internal Medicine

## 2012-11-25 ENCOUNTER — Telehealth: Payer: Self-pay | Admitting: Internal Medicine

## 2012-11-25 NOTE — Telephone Encounter (Signed)
Spoke to patient and she states that we did send the medication to the correct pharmacy. I have contacted [facility] and she states that the prescription was not received. I have given her a verbal order for ranititine 300 mg once daily.

## 2012-11-25 NOTE — Telephone Encounter (Signed)
Left message to call back. We did send script to [facility] on [address]. Is this the correct pharmacy?

## 2012-12-20 ENCOUNTER — Ambulatory Visit
Admission: RE | Admit: 2012-12-20 | Discharge: 2012-12-20 | Disposition: A | Discharge status: Home / Self Care | Payer: BC Managed Care – PPO | Source: Ambulatory Visit

## 2012-12-20 DIAGNOSIS — Z1231 Encounter for screening mammogram for malignant neoplasm of breast: Secondary | ICD-10-CM

## 2012-12-27 ENCOUNTER — Encounter: Payer: Self-pay | Admitting: Internal Medicine

## 2012-12-27 ENCOUNTER — Ambulatory Visit (AMBULATORY_SURGERY_CENTER): Payer: BC Managed Care – PPO | Admitting: Internal Medicine

## 2012-12-27 VITALS — BP 128/75 | HR 54 | Temp 98.8°F | Resp 19 | Ht 66.0 in | Wt 259.0 lb

## 2012-12-27 DIAGNOSIS — K222 Esophageal obstruction: Secondary | ICD-10-CM

## 2012-12-27 DIAGNOSIS — K219 Gastro-esophageal reflux disease without esophagitis: Secondary | ICD-10-CM

## 2012-12-27 DIAGNOSIS — R1319 Other dysphagia: Secondary | ICD-10-CM

## 2012-12-27 MED ORDER — PANTOPRAZOLE SODIUM 40 MG PO TBEC: 40.0000 mg | DELAYED_RELEASE_TABLET | Freq: Every day | ORAL | Status: DC

## 2012-12-27 MED ORDER — SODIUM CHLORIDE 0.9 % IV SOLN: 500.0000 mL | INTRAVENOUS | Status: DC

## 2012-12-27 NOTE — Patient Instructions (Addendum)
Stricture seen,dilated today. Dilation diet today, nothing by mouth until 11:51 am, then clear liquids for 1 hour, then soft diet rest of today. Resume regular diet tomorrow. Pick up new RX for Protonix take daily, stop Zantac.  Anti-reflux, stricture, & diet handout given. Call us with any questions or concerns. Thank you!!!  YOU HAD AN ENDOSCOPIC PROCEDURE TODAY AT THE [facility]: Refer to the procedure report that was given to you for any specific questions about what was found during the examination.  If the procedure report does not answer your questions, please call your gastroenterologist to clarify.  If you requested that your care partner not be given the details of your procedure findings, then the procedure report has been included in a sealed envelope for you to review at your convenience later.  YOU SHOULD EXPECT: Some feelings of bloating in the abdomen. Passage of more gas than usual.  Walking can help get rid of the air that was put into your GI tract during the procedure and reduce the bloating. If you had a lower endoscopy (such as a colonoscopy or flexible sigmoidoscopy) you may notice spotting of blood in your stool or on the toilet paper. If you underwent a bowel prep for your procedure, then you may not have a normal bowel movement for a few days.  DIET: Dilation Diet today, see handout given. Drink plenty of fluids but you should avoid alcoholic beverages for 24 hours.  ACTIVITY: Your care partner should take you home directly after the procedure.  You should plan to take it easy, moving slowly for the rest of the day.  You can resume normal activity the day after the procedure however you should NOT DRIVE or use heavy machinery for 24 hours (because of the sedation medicines used during the test).    SYMPTOMS TO REPORT IMMEDIATELY: A gastroenterologist can be reached at any hour.  During normal business hours, 8:30 AM to 5:00 PM Monday through Friday, call [phone]  [phone].  After hours and on weekends, please call the GI answering service at [phone] who will take a message and have the physician on call contact you.   Following lower endoscopy (colonoscopy or flexible sigmoidoscopy):  Excessive amounts of blood in the stool  Significant tenderness or worsening of abdominal pains  Swelling of the abdomen that is new, acute  Fever of 100F or higher  Following upper endoscopy (EGD)  Vomiting of blood or coffee ground material  New chest pain or pain under the shoulder blades  Painful or persistently difficult swallowing  New shortness of breath  Fever of 100F or higher  Black, tarry-looking stools  FOLLOW UP: If any biopsies were taken you will be contacted by phone or by letter within the next 1-3 weeks.  Call your gastroenterologist if you have not heard about the biopsies in 3 weeks.  Our staff will call the home number listed on your records the next business day following your procedure to check on you and address any questions or concerns that you may have at that time regarding the information given to you following your procedure. This is a courtesy call and so if there is no answer at the home number and we have not heard from you through the emergency physician on call, we will assume that you have returned to your regular daily activities without incident.  SIGNATURES/CONFIDENTIALITY: You and/or your care partner have signed paperwork which will be entered into your electronic medical record.  These  signatures attest to the fact that that the information above on your After Visit Summary has been reviewed and is understood.  Full responsibility of the confidentiality of this discharge information lies with you and/or your care-partner. 

## 2012-12-27 NOTE — Progress Notes (Signed)
Patient did not experience any of the following events: a burn prior to discharge; a fall within the facility; wrong site/side/patient/procedure/implant event; or a hospital transfer or hospital admission upon discharge from the facility. (G8907) Patient did not have preoperative order for IV antibiotic SSI prophylaxis. (G8918)  

## 2012-12-27 NOTE — Progress Notes (Signed)
NO EGG OR SOY ALLERGY EWM NO PROBLEMS WITH PAST SEDATION. EWM 

## 2012-12-27 NOTE — Op Note (Signed)
[facility] [address] [place], [address]   ENDOSCOPY PROCEDURE REPORT  PATIENT: Shannon Navarro, Shannon Navarro  MR#: [id number] BIRTHDATE: 1955/11/04 , 56  yrs. old GENDER: Female ENDOSCOPIST: Hart Carwin, MD REFERRED BY:  Rene Paci, M.D. PROCEDURE DATE:  12/27/2012 PROCEDURE:  EGD, diagnostic and Savary dilation of esophagus ASA CLASS:     Class II INDICATIONS:  Dysphagia.   hx of esophageal stricture.  food impaction in 2006, dilated to 17 mm in 2006. MEDICATIONS: MAC sedation, administered by CRNA and propofol (Diprivan) 200mg  IV TOPICAL ANESTHETIC: none  DESCRIPTION OF PROCEDURE: After the risks benefits and alternatives of the procedure were thoroughly explained, informed consent was obtained.  The LB [id number] endoscope was introduced through the mouth and advanced to the second portion of the duodenum. Without limitations.  The instrument was slowly withdrawn as the mucosa was fully examined.      esophagus: Proximal and mid esophageal mucosa appeared normal. There was a him benign-appearing fibrotic esophageal stricture at the level of 37 cm from the incisors initially did not allow the endoscope to traverse through. Family of the stricture was about 11-12 mm. Distal to the stricture was a 3 cm hiatal hernia  Stomach: Esgic mucosa appeared normal. There was no retained food. There were no erosions. Gastric outlet was unremarkable. Retroflexion of the endoscope confirmed presence of hiatal hernia Duodenum: Descending duodenum and duodenal bulb were normal  Guidewire was placed into the stomach and a several-day dilators passed over the guidewire without fluoroscopic guidance. 13mm, 15 mm, and 16 mm dilators passed. There was small amount of blood on the last dilator the patient tolerated procedure well[         The scope was then withdrawn from the patient and the procedure completed.  COMPLICATIONS: There were no  complications. ENDOSCOPIC IMPRESSION: benign distal esophageal stricture status post dilation to 16 mm 3 cm nonreducible hiatal hernia No evidence also thickening or any other abnormality in the esophagus  RECOMMENDATIONS:  Antireflux measures Continue PPIs Return when necessary REPEAT EXAM: no  eSigned:  Hart Carwin, MD 12/27/2012 11:05 AM   CC:

## 2012-12-27 NOTE — Progress Notes (Signed)
A/ox3 pleased with MAC, report to Robbin RN 

## 2012-12-27 NOTE — Progress Notes (Signed)
New RX sent to pharmacy for protonix per verbal order given by Dr.Brodie.

## 2012-12-27 NOTE — Progress Notes (Signed)
Called to room to assist during endoscopic procedure.  Patient ID and intended procedure confirmed with present staff. Received instructions for my participation in the procedure from the performing physician.  

## 2012-12-28 ENCOUNTER — Telehealth: Payer: Self-pay

## 2012-12-28 NOTE — Telephone Encounter (Signed)
  Follow up Call-  Call back number 12/27/2012  Post procedure Call Back phone  # [phone]  Permission to leave phone message Yes     Patient questions:  Do you have a fever, pain , or abdominal swelling? no Pain Score  0 *  Have you tolerated food without any problems? yes  Have you been able to return to your normal activities? yes  Do you have any questions about your discharge instructions: Diet   no Medications  no Follow up visit  no  Do you have questions or concerns about your Care? no  Actions: * If pain score is 4 or above: No action needed, pain <4.

## 2012-12-30 ENCOUNTER — Encounter: Payer: BC Managed Care – PPO | Admitting: Internal Medicine

## 2012-12-30 ENCOUNTER — Telehealth: Payer: Self-pay | Admitting: *Deleted

## 2012-12-30 DIAGNOSIS — Z0000 Encounter for general adult medical examination without abnormal findings: Secondary | ICD-10-CM

## 2012-12-30 NOTE — Telephone Encounter (Signed)
Entered labs...lmb 

## 2012-12-30 NOTE — Telephone Encounter (Signed)
Message copied by Deatra James on Thu Dec 30, 2012 11:44 AM ------      Message from: Burnett Harry      Created: Thu Dec 30, 2012 10:11 AM      Regarding: cpe 02/28/13       Pt was already scheduled before the rules changed, thanks! ------

## 2013-01-12 ENCOUNTER — Encounter: Payer: BC Managed Care – PPO | Admitting: Internal Medicine

## 2013-02-21 ENCOUNTER — Other Ambulatory Visit (INDEPENDENT_AMBULATORY_CARE_PROVIDER_SITE_OTHER): Payer: BC Managed Care – PPO

## 2013-02-21 DIAGNOSIS — Z0000 Encounter for general adult medical examination without abnormal findings: Secondary | ICD-10-CM

## 2013-02-21 LAB — BASIC METABOLIC PANEL
GFR: 84.96 mL/min (ref >60.00)
Potassium: 4.3 mEq/L (ref 3.5–5.1)
Sodium: 143 mEq/L (ref 135–145)

## 2013-02-21 LAB — URINALYSIS, ROUTINE W REFLEX MICROSCOPIC
Ketones, ur: NEGATIVE
Leukocytes, UA: NEGATIVE
Specific Gravity, Urine: >=1.03 (ref 1.000–1.030)
pH: 6 (ref 5.0–8.0)

## 2013-02-21 LAB — CBC WITH DIFFERENTIAL/PLATELET
Eosinophils Relative: 4.2 % (ref 0.0–5.0)
HCT: 40.3 % (ref 36.0–46.0)
Hemoglobin: 13.7 g/dL (ref 12.0–15.0)
Lymphs Abs: 1.2 10*3/uL (ref 0.7–4.0)
Monocytes Relative: 9.8 % (ref 3.0–12.0)
Neutro Abs: 3.3 10*3/uL (ref 1.4–7.7)
Platelets: 235 10*3/uL (ref 150.0–400.0)
WBC: 5.3 10*3/uL (ref 4.5–10.5)

## 2013-02-21 LAB — LIPID PANEL
LDL Cholesterol: 102 mg/dL — ABNORMAL HIGH (ref 0–99)
VLDL: 13.6 mg/dL (ref 0.0–40.0)

## 2013-02-21 LAB — TSH: TSH: 1.46 u[IU]/mL (ref 0.35–5.50)

## 2013-02-21 LAB — HEPATIC FUNCTION PANEL
Albumin: 3.6 g/dL (ref 3.5–5.2)
Alkaline Phosphatase: 61 U/L (ref 39–117)

## 2013-02-28 ENCOUNTER — Encounter: Payer: Self-pay | Admitting: Internal Medicine

## 2013-02-28 ENCOUNTER — Other Ambulatory Visit (INDEPENDENT_AMBULATORY_CARE_PROVIDER_SITE_OTHER): Payer: BC Managed Care – PPO

## 2013-02-28 ENCOUNTER — Ambulatory Visit (INDEPENDENT_AMBULATORY_CARE_PROVIDER_SITE_OTHER): Payer: BC Managed Care – PPO | Admitting: Internal Medicine

## 2013-02-28 VITALS — BP 130/84 | HR 56 | Temp 97.9°F | Ht 66.0 in | Wt 261.1 lb

## 2013-02-28 DIAGNOSIS — R7309 Other abnormal glucose: Secondary | ICD-10-CM

## 2013-02-28 DIAGNOSIS — J309 Allergic rhinitis, unspecified: Secondary | ICD-10-CM

## 2013-02-28 DIAGNOSIS — E669 Obesity, unspecified: Secondary | ICD-10-CM

## 2013-02-28 DIAGNOSIS — Z0000 Encounter for general adult medical examination without abnormal findings: Secondary | ICD-10-CM

## 2013-02-28 DIAGNOSIS — Z23 Encounter for immunization: Secondary | ICD-10-CM

## 2013-02-28 MED ORDER — AZITHROMYCIN 250 MG PO TABS: ORAL_TABLET | ORAL | Status: DC

## 2013-02-28 MED ORDER — VENLAFAXINE HCL ER 75 MG PO CP24: 75.0000 mg | ORAL_CAPSULE | Freq: Every day | ORAL | Status: DC

## 2013-02-28 MED ORDER — RANITIDINE HCL 300 MG PO TABS: 300.0000 mg | ORAL_TABLET | Freq: Every day | ORAL | Status: DC

## 2013-02-28 MED ORDER — FLUTICASONE PROPIONATE 50 MCG/ACT NA SUSP: 1.0000 | Freq: Every day | NASAL | Status: DC

## 2013-02-28 MED ORDER — LOSARTAN POTASSIUM 100 MG PO TABS: 100.0000 mg | ORAL_TABLET | Freq: Every day | ORAL | Status: DC

## 2013-02-28 NOTE — Assessment & Plan Note (Signed)
Wt Readings from Last 3 Encounters:  02/28/13 261 lb 1.9 oz (118.443 kg)  12/27/12 259 lb (117.482 kg)  11/23/12 259 lb (117.482 kg)  had begun [employer] program for weight loss 06/2012 - no longer involved with same encouragement and support offered today The patient is asked to make an attempt to improve diet and exercise patterns to aid in medical management of this problem.

## 2013-02-28 NOTE — Assessment & Plan Note (Signed)
Reviewed risk for DM development - pt to reduce sugar intake with diet changes and increase exercise efforts to reduce weight and reduce DM risk Check A1c now and annually, more frequently if greater than 7  Lab Results  Component Value Date   HGBA1C 5.9 12/17/2010

## 2013-02-28 NOTE — Patient Instructions (Addendum)
It was good to see you today.  We have reviewed your prior records including labs and tests today  Your annual flu shot was given and/or updated today.  Health Maintenance reviewed - all recommended immunizations and age-appropriate screenings are up-to-date.  Test(s) ordered today. Your results will be released to MyChart (or called to you) after review, usually within 72hours after test completion. If any changes need to be made, you will be notified at that same time.  Medications reviewed and updated, begin once daily Flonase spray for allergy symptoms in addition to ongoing Claritin and take Z-Pak as prescribed over the next 5 days -no other changes recommended  Your prescription(s) have been submitted to your pharmacy. Please take as directed and contact our office if you believe you are having problem(s) with the medication(s).  Work on lifestyle changes as discussed (low fat, low carb, increased protein diet; improved exercise efforts; weight loss) to control sugar, blood pressure and cholesterol levels and/or reduce risk of developing other medical problems. Look into [email or web address] or other type of food journal to assist you in this process.  Please schedule followup in 6 months for weight check and review, call sooner if problems.   Health Maintenance, Females A healthy lifestyle and preventative care can promote health and wellness.  Maintain regular health, dental, and eye exams.  Eat a healthy diet. Foods like vegetables, fruits, whole grains, low-fat dairy products, and lean protein foods contain the nutrients you need without too many calories. Decrease your intake of foods high in solid fats, added sugars, and salt. Get information about a proper diet from your caregiver, if necessary.  Regular physical exercise is one of the most important things you can do for your health. Most adults should get at least 150 minutes of moderate-intensity exercise (any activity that  increases your heart rate and causes you to sweat) each week. In addition, most adults need muscle-strengthening exercises on 2 or more days a week.   Maintain a healthy weight. The body mass index (BMI) is a screening tool to identify possible weight problems. It provides an estimate of body fat based on height and weight. Your caregiver can help determine your BMI, and can help you achieve or maintain a healthy weight. For adults 20 years and older:  A BMI below 18.5 is considered underweight.  A BMI of 18.5 to 24.9 is normal.  A BMI of 25 to 29.9 is considered overweight.  A BMI of 30 and above is considered obese.  Maintain normal blood lipids and cholesterol by exercising and minimizing your intake of saturated fat. Eat a balanced diet with plenty of fruits and vegetables. Blood tests for lipids and cholesterol should begin at age 107 and be repeated every 5 years. If your lipid or cholesterol levels are high, you are over 50, or you are a high risk for heart disease, you may need your cholesterol levels checked more frequently.Ongoing high lipid and cholesterol levels should be treated with medicines if diet and exercise are not effective.  If you smoke, find out from your caregiver how to quit. If you do not use tobacco, do not start.  If you are pregnant, do not drink alcohol. If you are breastfeeding, be very cautious about drinking alcohol. If you are not pregnant and choose to drink alcohol, do not exceed 1 drink per day. One drink is considered to be 12 ounces (355 mL) of beer, 5 ounces (148 mL) of wine, or 1.5 ounces (44 mL) of  liquor.  Avoid use of street drugs. Do not share needles with anyone. Ask for help if you need support or instructions about stopping the use of drugs.  High blood pressure causes heart disease and increases the risk of stroke. Blood pressure should be checked at least every 1 to 2 years. Ongoing high blood pressure should be treated with medicines, if weight  loss and exercise are not effective.  If you are 22 to 56 years old, ask your caregiver if you should take aspirin to prevent strokes.  Diabetes screening involves taking a blood sample to check your fasting blood sugar level. This should be done once every 3 years, after age 23, if you are within normal weight and without risk factors for diabetes. Testing should be considered at a younger age or be carried out more frequently if you are overweight and have at least 1 risk factor for diabetes.  Breast cancer screening is essential preventative care for women. You should practice "breast self-awareness." This means understanding the normal appearance and feel of your breasts and may include breast self-examination. Any changes detected, no matter how small, should be reported to a caregiver. Women in their 69s and 30s should have a clinical breast exam (CBE) by a caregiver as part of a regular health exam every 1 to 3 years. After age 33, women should have a CBE every year. Starting at age 52, women should consider having a mammogram (breast X-ray) every year. Women who have a family history of breast cancer should talk to their caregiver about genetic screening. Women at a high risk of breast cancer should talk to their caregiver about having an MRI and a mammogram every year.  The Pap test is a screening test for cervical cancer. Women should have a Pap test starting at age 4. Between ages 49 and 85, Pap tests should be repeated every 2 years. Beginning at age 56, you should have a Pap test every 3 years as long as the past 3 Pap tests have been normal. If you had a hysterectomy for a problem that was not cancer or a condition that could lead to cancer, then you no longer need Pap tests. If you are between ages 26 and 10, and you have had normal Pap tests going back 10 years, you no longer need Pap tests. If you have had past treatment for cervical cancer or a condition that could lead to cancer, you need  Pap tests and screening for cancer for at least 20 years after your treatment. If Pap tests have been discontinued, risk factors (such as a new sexual partner) need to be reassessed to determine if screening should be resumed. Some women have medical problems that increase the chance of getting cervical cancer. In these cases, your caregiver may recommend more frequent screening and Pap tests.  The human papillomavirus (HPV) test is an additional test that may be used for cervical cancer screening. The HPV test looks for the virus that can cause the cell changes on the cervix. The cells collected during the Pap test can be tested for HPV. The HPV test could be used to screen women aged 74 years and older, and should be used in women of any age who have unclear Pap test results. After the age of 58, women should have HPV testing at the same frequency as a Pap test.  Colorectal cancer can be detected and often prevented. Most routine colorectal cancer screening begins at the age of 38 and  continues through age 35. However, your caregiver may recommend screening at an earlier age if you have risk factors for colon cancer. On a yearly basis, your caregiver may provide home test kits to check for hidden blood in the stool. Use of a small camera at the end of a tube, to directly examine the colon (sigmoidoscopy or colonoscopy), can detect the earliest forms of colorectal cancer. Talk to your caregiver about this at age 66, when routine screening begins. Direct examination of the colon should be repeated every 5 to 10 years through age 61, unless early forms of pre-cancerous polyps or small growths are found.  Hepatitis C blood testing is recommended for all people born from [birth year] through 1964 and any individual with known risks for hepatitis C.  Practice safe sex. Use condoms and avoid high-risk sexual practices to reduce the spread of sexually transmitted infections (STIs). Sexually active women aged 49 and  younger should be checked for Chlamydia, which is a common sexually transmitted infection. Older women with new or multiple partners should also be tested for Chlamydia. Testing for other STIs is recommended if you are sexually active and at increased risk.  Osteoporosis is a disease in which the bones lose minerals and strength with aging. This can result in serious bone fractures. The risk of osteoporosis can be identified using a bone density scan. Women ages 31 and over and women at risk for fractures or osteoporosis should discuss screening with their caregivers. Ask your caregiver whether you should be taking a calcium supplement or vitamin D to reduce the rate of osteoporosis.  Menopause can be associated with physical symptoms and risks. Hormone replacement therapy is available to decrease symptoms and risks. You should talk to your caregiver about whether hormone replacement therapy is right for you.  Use sunscreen with a sun protection factor (SPF) of 30 or greater. Apply sunscreen liberally and repeatedly throughout the day. You should seek shade when your shadow is shorter than you. Protect yourself by wearing long sleeves, pants, a wide-brimmed hat, and sunglasses year round, whenever you are outdoors.  Notify your caregiver of new moles or changes in moles, especially if there is a change in shape or color. Also notify your caregiver if a mole is larger than the size of a pencil eraser.  Stay current with your immunizations. Document Released: 11/11/2010 Document Revised: 07/21/2011 Document Reviewed: 11/11/2010 [facility] Patient Information 2014 [place], [place].

## 2013-02-28 NOTE — Assessment & Plan Note (Addendum)
Associated with chronic sinusitis symptoms. Add scheduled nasal steroid to ongoing oral antihistamine Course of antibiotics to help with potential underlying infectious cause

## 2013-02-28 NOTE — Progress Notes (Signed)
Pre-visit discussion using our clinic review tool. No additional management support is needed unless otherwise documented below in the visit note.  

## 2013-02-28 NOTE — Progress Notes (Signed)
Subjective:    Patient ID: Shannon Navarro, female    DOB: 05/02/1956, 57 y.o.   MRN: [id number]  HPI  Patient here today for annual physical.    Interval medical events and chronic medical issues reviewed.  HTN - patient reports compliance with current medical treatment.  No adverse symptoms to current medications.  No headaches, visual changes, focal neuro defects.   Allergic rhinitis - pt maintained on Claritin daily.  Has also been using OTCFlonase for the past several days without significant improvement. Notes increased sinus pressure and post nasal drip drainage for the past 4 weeks.  Denies fevers, occasional chills, no cough, ear pain, or rhinorrhea.    GERD - followed by GI Juanda Chance) for same.  Pt with recent esophageal dilatation fall 2014.  Reports current medical treatment is effective and has had no changes with dose/frequency.    Anxiety - maintained on Effexor.  Pt reports symptoms are stable on current therapy.  No change in dose or frequency.  Pt denies adverse effects with current therapy.    Past Medical History  Diagnosis Date  . Obesity, unspecified   . HYPERGLYCEMIA   . Migraine   . Shingles   . ALLERGIC RHINITIS   . ANXIETY   . HYPERTENSION   . Menopause     @ 48  . Pseudotumor cerebri 2007  . GERD 07/2004    hx food impaction  . OSA (obstructive sleep apnea) 01/16/2012    started CPAP 02/2012  . Hiatal hernia   . Fatty liver 06/23/07   Family History  Problem Relation Age of Onset  . Breast cancer Mother   . Asthma Mother   . Hypertension Mother   . Prostate cancer Father   . Aortic stenosis Father   . Hypertension Father   . Colon polyps Father   . Heart disease Father     arteriostenosis  . Diabetes Father   . Colon cancer Neg Hx   . Esophageal cancer Neg Hx   . Stomach cancer Neg Hx   . Rectal cancer Neg Hx    History  Substance Use Topics  . Smoking status: Never Smoker   . Smokeless tobacco: Never Used     Comment: Married lives  with spouse + 2 daughters  . Alcohol Use: No    Review of Systems  Constitutional: Positive for chills. Negative for fever and activity change.  HENT: Positive for congestion, postnasal drip and sinus pressure. Negative for ear discharge, ear pain, rhinorrhea and sore throat.   Eyes: Positive for discharge. Negative for redness and itching.  Respiratory: Negative for cough, chest tightness and shortness of breath.   Cardiovascular: Negative for chest pain, palpitations and leg swelling.  Gastrointestinal: Negative for nausea, vomiting, abdominal pain, diarrhea, constipation and abdominal distention.  Endocrine: Negative for polydipsia, polyphagia and polyuria.  Genitourinary: Negative for dysuria and flank pain.  Neurological: Negative for dizziness, syncope and headaches.  Psychiatric/Behavioral: Negative.   All other systems reviewed and are negative.       Objective:   Physical Exam  Vitals reviewed. Constitutional: She is oriented to person, place, and time. She appears well-developed and well-nourished. No distress.  obese  HENT:  Head: Normocephalic and atraumatic.  Right Ear: External ear normal.  Left Ear: External ear normal.  Nose: Nose normal.  Mouth/Throat: Oropharynx is clear and moist. No oropharyngeal exudate.  Positive frontal and maxillary sinus tenderness to palpation  Eyes: Conjunctivae and EOM are normal. Pupils are equal, round,  and reactive to light. Right eye exhibits no discharge. Left eye exhibits no discharge. No scleral icterus.  Neck: Normal range of motion. Neck supple. No thyromegaly present.  Cardiovascular: Normal rate, regular rhythm and normal heart sounds.   Pulmonary/Chest: Effort normal and breath sounds normal. No respiratory distress. She has no wheezes.  Abdominal: Soft. Bowel sounds are normal. She exhibits no distension. There is no tenderness. There is no rebound and no guarding.  Musculoskeletal: Normal range of motion. She exhibits no  edema and no tenderness.  Lymphadenopathy:    She has no cervical adenopathy.  Neurological: She is alert and oriented to person, place, and time.  Skin: Skin is warm and dry. No rash noted. She is not diaphoretic.  Psychiatric: She has a normal mood and affect. Her behavior is normal. Judgment and thought content normal.    Wt Readings from Last 3 Encounters:  02/28/13 261 lb 1.9 oz (118.443 kg)  12/27/12 259 lb (117.482 kg)  11/23/12 259 lb (117.482 kg)   BP Readings from Last 3 Encounters:  02/28/13 130/84  12/27/12 128/75  11/23/12 110/70        Assessment & Plan:   CPX/v70.0 - Patient has been counseled on age-appropriate routine health concerns for screening and prevention. These are reviewed and up-to-date. Immunizations are up-to-date or declined. Labs ordered and reviewed.  Also See problem list. Medications and labs reviewed today.

## 2013-03-12 HISTORY — PX: OTHER SURGICAL HISTORY: SHX169

## 2013-03-23 ENCOUNTER — Other Ambulatory Visit: Payer: Self-pay | Admitting: *Deleted

## 2013-03-23 DIAGNOSIS — K219 Gastro-esophageal reflux disease without esophagitis: Secondary | ICD-10-CM

## 2013-03-23 MED ORDER — PANTOPRAZOLE SODIUM 40 MG PO TBEC: 40.0000 mg | DELAYED_RELEASE_TABLET | Freq: Every day | ORAL | Status: DC

## 2013-03-23 MED ORDER — VENLAFAXINE HCL ER 75 MG PO CP24: 75.0000 mg | ORAL_CAPSULE | Freq: Every day | ORAL | Status: DC

## 2013-03-23 MED ORDER — LOSARTAN POTASSIUM 100 MG PO TABS: 100.0000 mg | ORAL_TABLET | Freq: Every day | ORAL | Status: DC

## 2013-03-23 MED ORDER — RANITIDINE HCL 300 MG PO TABS: 300.0000 mg | ORAL_TABLET | Freq: Every day | ORAL | Status: DC

## 2013-03-23 NOTE — Telephone Encounter (Signed)
Pt is here with her mother. She saw md 02/28/13 for her cpx. She has misplaced her mail order scripts requesting new prescription so she can mail off.../lmb   Scripts given to patient...Raechel Chute

## 2013-05-19 ENCOUNTER — Telehealth: Payer: Self-pay | Admitting: Pulmonary Disease

## 2013-05-19 NOTE — Telephone Encounter (Signed)
I spoke with pt. Advised it is for her yearly f/u and can take chip for download if she does not have a resmed as we can download the chip here in office. Nothing further needed

## 2013-05-23 ENCOUNTER — Ambulatory Visit (INDEPENDENT_AMBULATORY_CARE_PROVIDER_SITE_OTHER): Payer: BC Managed Care – PPO | Admitting: Pulmonary Disease

## 2013-05-23 ENCOUNTER — Encounter: Payer: Self-pay | Admitting: Pulmonary Disease

## 2013-05-23 VITALS — BP 118/76 | HR 61 | Temp 98.1°F | Ht 66.0 in | Wt 262.0 lb

## 2013-05-23 DIAGNOSIS — G4733 Obstructive sleep apnea (adult) (pediatric): Secondary | ICD-10-CM

## 2013-05-23 NOTE — Progress Notes (Signed)
  Chief Complaint  Patient presents with  . Follow-up    Pt has not had CPAP downloaded yet. She reports she wears her CPAP everynight and with naps. Wears about 8 hrs per night. She has a new nasal mask and likes this much better. She is feeling rested during the day.     History of Present Illness: Shannon Navarro is a 58 y.o. female with OSA.  She has nasal mask.  She goes to bed at 10 pm and wakes up at 730 am.  She does not need to take naps.  She uses her CPAP for about 8 hrs per night.  She uses [employer] as her DME.  Tests: PSG 02/09/12>>AHI 33.8, SpO2 low 82%, PLMI 21.7, CPAP 10 cm H2O>>+R, +S. CPAP 02/25/12 to 04/23/12 >> Used on 59 of 59 nights with average 7 hrs 53 min.  Average AHI 5.5 with CPAP 10 cm H2O.  She  has a past medical history of Obesity, unspecified; HYPERGLYCEMIA; Migraine; Shingles; ALLERGIC RHINITIS; ANXIETY; HYPERTENSION; Menopause; Pseudotumor cerebri (2007); GERD (07/2004); OSA (obstructive sleep apnea) (01/16/2012); Hiatal hernia; and Fatty liver (06/23/07).  She  has past surgical history that includes Pseudotumor Cerebri (in 2007); BTL (1995); Dilation and curettage of uterus (2010); Colonoscopy; and Upper gastrointestinal endoscopy.   Current Outpatient Prescriptions on File Prior to Visit  Medication Sig Dispense Refill  . loratadine (CLARITIN) 10 MG tablet Take 1 tablet (10 mg total) by mouth daily as needed for allergies.  30 tablet  2  . losartan (COZAAR) 100 MG tablet Take 1 tablet (100 mg total) by mouth daily.  90 tablet  3  . ranitidine (ZANTAC) 300 MG tablet Take 1 tablet (300 mg total) by mouth daily.  90 tablet  3  . venlafaxine XR (EFFEXOR-XR) 75 MG 24 hr capsule Take 1 capsule (75 mg total) by mouth daily.  90 capsule  3   No current facility-administered medications on file prior to visit.    Allergies  Allergen Reactions  . Cefaclor     REACTION: rash  . Codeine     REACTION: vomiting  . Hydrocodone     Severe vomiting  .  Levofloxacin     REACTION: Nausea  . Nexium [Esomeprazole Magnesium]     Made legs ache  . Penicillins     REACTION: Rash  . Sulfonamide Derivatives     REACTION: Rash    Physical Exam:  General - No distress  ENT - No sinus tenderness, no oral exudate, 3+ tonsils, no LAN Cardiac - s1s2 regular, no murmur Chest - no wheeze   Abd - soft, non-tender Ext - no edema Neuro - normal strength  Skin - no rashes Psych - normal mood, and behavior  Assessment/Plan:  Chesley Mires, MD [facility] Pager:  [phone] 05/23/2013, 9:53 AM

## 2013-05-23 NOTE — Patient Instructions (Signed)
Will call with results of CPAP report  Follow up in 1 year 

## 2013-05-31 NOTE — Assessment & Plan Note (Signed)
She is compliant with CPAP and reports benefit.  Will call her with results of her CPAP download.

## 2013-06-07 ENCOUNTER — Telehealth: Payer: Self-pay | Admitting: Internal Medicine

## 2013-06-07 MED ORDER — VITAMIN D3 125 MCG (5000 UT) PO TABS: 5000.0000 [IU] | ORAL_TABLET | Freq: Every day | ORAL | Status: DC

## 2013-06-07 NOTE — Telephone Encounter (Signed)
Notified pt with md response.../lmb 

## 2013-06-07 NOTE — Telephone Encounter (Signed)
I recommended daily Vit D 5000 U daily (OTC) rather than rx Vit D weekly

## 2013-06-07 NOTE — Telephone Encounter (Signed)
Patient says that she went to see her GYN for her annual check-up and found out her Vitamin D level was a 9. She wanted to be advised by Dr. Asa Lente on what to do. Please advise.

## 2013-07-10 DIAGNOSIS — E559 Vitamin D deficiency, unspecified: Secondary | ICD-10-CM

## 2013-07-10 HISTORY — DX: Vitamin D deficiency, unspecified: E55.9

## 2013-08-31 ENCOUNTER — Ambulatory Visit: Payer: BC Managed Care – PPO | Admitting: Internal Medicine

## 2013-10-06 ENCOUNTER — Ambulatory Visit: Payer: BC Managed Care – PPO | Admitting: Internal Medicine

## 2013-11-14 ENCOUNTER — Ambulatory Visit: Payer: BC Managed Care – PPO | Admitting: Internal Medicine

## 2013-12-01 ENCOUNTER — Other Ambulatory Visit (INDEPENDENT_AMBULATORY_CARE_PROVIDER_SITE_OTHER): Payer: BC Managed Care – PPO

## 2013-12-01 ENCOUNTER — Encounter: Payer: Self-pay | Admitting: Internal Medicine

## 2013-12-01 ENCOUNTER — Ambulatory Visit (INDEPENDENT_AMBULATORY_CARE_PROVIDER_SITE_OTHER): Payer: BC Managed Care – PPO | Admitting: Internal Medicine

## 2013-12-01 VITALS — BP 128/84 | HR 57 | Temp 98.2°F | Ht 66.0 in | Wt 258.1 lb

## 2013-12-01 DIAGNOSIS — E559 Vitamin D deficiency, unspecified: Secondary | ICD-10-CM

## 2013-12-01 DIAGNOSIS — G4733 Obstructive sleep apnea (adult) (pediatric): Secondary | ICD-10-CM

## 2013-12-01 DIAGNOSIS — I10 Essential (primary) hypertension: Secondary | ICD-10-CM

## 2013-12-01 DIAGNOSIS — K219 Gastro-esophageal reflux disease without esophagitis: Secondary | ICD-10-CM

## 2013-12-01 LAB — VITAMIN D 25 HYDROXY (VIT D DEFICIENCY, FRACTURES): VITD: 46.15 ng/mL (ref 30.00–100.00)

## 2013-12-01 MED ORDER — LORATADINE 10 MG PO TABS: 10.0000 mg | ORAL_TABLET | Freq: Every day | ORAL | Status: DC | PRN

## 2013-12-01 MED ORDER — LOSARTAN POTASSIUM 100 MG PO TABS: 100.0000 mg | ORAL_TABLET | Freq: Every day | ORAL | Status: DC

## 2013-12-01 MED ORDER — FLUTICASONE PROPIONATE 50 MCG/ACT NA SUSP: 1.0000 | NASAL | Status: DC | PRN

## 2013-12-01 MED ORDER — RANITIDINE HCL 300 MG PO TABS: 300.0000 mg | ORAL_TABLET | Freq: Every day | ORAL | Status: DC

## 2013-12-01 MED ORDER — VENLAFAXINE HCL ER 75 MG PO CP24: 75.0000 mg | ORAL_CAPSULE | Freq: Every day | ORAL | Status: DC

## 2013-12-01 NOTE — Assessment & Plan Note (Signed)
Hx food impaction reviewed (2006) -  Currently previously on aciphex (her preferred PPI), but controlled on rx H2B at present continue same and pt to call if problems for re-eval by GI prn

## 2013-12-01 NOTE — Patient Instructions (Addendum)
It was good to see you today.  We have reviewed your prior records including labs and tests today  Test(s) ordered today. Your results will be released to [facility] [facility] (or called to you) after review, usually within 72hours after test completion. If any changes need to be made, you will be notified at that same time.  Medications reviewed and updated, no changes recommended at this time. Refill on medication(s) as discussed today.  Please schedule followup in Oct/Nov 2015 for annual exam and labs, call sooner if problems.  Vitamin D Deficiency Vitamin D is an important vitamin that your body needs. Having too little of it in your body is called a deficiency. A very bad deficiency can make your bones soft and can cause a condition called rickets.  Vitamin D is important to your body for different reasons, such as:   It helps your body absorb 2 minerals called calcium and phosphorus.  It helps make your bones healthy.  It may prevent some diseases, such as diabetes and multiple sclerosis.  It helps your muscles and heart. You can get vitamin D in several ways. It is a natural part of some foods. The vitamin is also added to some dairy products and cereals. Some people take vitamin D supplements. Also, your body makes vitamin D when you are in the sun. It changes the sun's rays into a form of the vitamin that your body can use. CAUSES   Not eating enough foods that contain vitamin D.  Not getting enough sunlight.  Having certain digestive system diseases that make it hard to absorb vitamin D. These diseases include Crohn's disease, chronic pancreatitis, and cystic fibrosis.  Having a surgery in which part of the stomach or small intestine is removed.  Being obese. Fat cells pull vitamin D out of your blood. That means that obese people may not have enough vitamin D left in their blood and in other body tissues.  Having chronic kidney or liver disease. RISK FACTORS Risk factors are things  that make you more likely to develop a vitamin D deficiency. They include:  Being older.  Not being able to get outside very much.  Living in a nursing home.  Having had broken bones.  Having weak or thin bones (osteoporosis).  Having a disease or condition that changes how your body absorbs vitamin D.  Having dark skin.  Some medicines such as seizure medicines or steroids.  Being overweight or obese. SYMPTOMS Mild cases of vitamin D deficiency may not have any symptoms. If you have a very bad case, symptoms may include:  Bone pain.  Muscle pain.  Falling often.  Broken bones caused by a minor injury, due to osteoporosis. DIAGNOSIS A blood test is the best way to tell if you have a vitamin D deficiency. TREATMENT Vitamin D deficiency can be treated in different ways. Treatment for vitamin D deficiency depends on what is causing it. Options include:  Taking vitamin D supplements.  Taking a calcium supplement. Your caregiver will suggest what dose is best for you. HOME CARE INSTRUCTIONS  Take any supplements that your caregiver prescribes. Follow the directions carefully. Take only the suggested amount.  Have your blood tested 2 months after you start taking supplements.  Eat foods that contain vitamin D. Healthy choices include:  Fortified dairy products, cereals, or juices. Fortified means vitamin D has been added to the food. Check the label on the package to be sure.  Fatty fish like salmon or trout.  Eggs.  Oysters.  Do not use a tanning bed.  Keep your weight at a healthy level. Lose weight if you need to.  Keep all follow-up appointments. Your caregiver will need to perform blood tests to make sure your vitamin D deficiency is going away. SEEK MEDICAL CARE IF:  You have any questions about your treatment.  You continue to have symptoms of vitamin D deficiency.  You have nausea or vomiting.  You are constipated.  You feel confused.  You have  severe abdominal or back pain. MAKE SURE YOU:  Understand these instructions.  Will watch your condition.  Will get help right away if you are not doing well or get worse. Document Released: 07/21/2011 Document Revised: 08/23/2012 Document Reviewed: 07/21/2011 [facility] Patient Information 2015 [place], [place]. This information is not intended to replace advice given to you by your health care provider. Make sure you discuss any questions you have with your health care provider.

## 2013-12-01 NOTE — Assessment & Plan Note (Signed)
BP Readings from Last 3 Encounters:  12/01/13 128/84  05/23/13 118/76  02/28/13 130/84   The current medical regimen is effective;  continue present plan and medications.

## 2013-12-01 NOTE — Progress Notes (Signed)
Subjective:    Patient ID: Shannon Navarro, female    DOB: 03-13-1956, 58 y.o.   MRN: [id number]  HPI  Patient is here for follow up  Reviewed chronic medical issues and interval medical events  Past Medical History  Diagnosis Date  . Obesity, unspecified   . HYPERGLYCEMIA   . Migraine   . Shingles   . ALLERGIC RHINITIS   . ANXIETY   . HYPERTENSION   . Menopause     @ 28  . Pseudotumor cerebri 2007  . GERD 07/2004    hx food impaction  . OSA (obstructive sleep apnea) 01/16/2012    started CPAP 02/2012  . Hiatal hernia   . Fatty liver 06/23/07    Review of Systems  Constitutional: Positive for fatigue (but improved since taking Vit D (08/2013)). Negative for fever and unexpected weight change.  Respiratory: Negative for cough and shortness of breath.   Cardiovascular: Negative for chest pain, palpitations and leg swelling.       Objective:   Physical Exam  BP 138/90  Pulse 57  Temp(Src) 98.2 F (36.8 C) (Oral)  Ht 5\' 6"  (1.676 m)  Wt 258 lb 2 oz (117.085 kg)  BMI 41.68 kg/m2  SpO2 97% Wt Readings from Last 3 Encounters:  12/01/13 258 lb 2 oz (117.085 kg)  05/23/13 262 lb (118.842 kg)  02/28/13 261 lb 1.9 oz (118.443 kg)   Constitutional: She is obese, and appears well-developed and well-nourished. No distress.  Neck: Normal range of motion. Neck supple. No JVD present. No thyromegaly present.  Cardiovascular: Normal rate, regular rhythm and normal heart sounds.  No murmur heard. No BLE edema. Pulmonary/Chest: Effort normal and breath sounds normal. No respiratory distress. She has no wheezes.  Psychiatric: She has a normal mood and affect. Her behavior is normal. Judgment and thought content normal.   Lab Results  Component Value Date   WBC 5.3 02/21/2013   HGB 13.7 02/21/2013   HCT 40.3 02/21/2013   PLT 235.0 02/21/2013   GLUCOSE 128* 02/21/2013   CHOL 154 02/21/2013   TRIG 68.0 02/21/2013   HDL 38.70* 02/21/2013   LDLCALC 102* 02/21/2013   ALT 25  02/21/2013   AST 16 02/21/2013   NA 143 02/21/2013   K 4.3 02/21/2013   CL 108 02/21/2013   CREATININE 0.8 02/21/2013   BUN 14 02/21/2013   CO2 28 02/21/2013   TSH 1.46 02/21/2013   HGBA1C 6.0 02/28/2013    Mm Digital Screening  12/21/2012   *RADIOLOGY REPORT*  Clinical Data: Screening.  DIGITAL SCREENING BILATERAL MAMMOGRAM WITH CAD   DIGITAL BREAST TOMOSYNTHESIS  Digital breast tomosynthesis images are acquired in two projections.  These images are reviewed in combination with the digital mammogram, confirming the findings below.  Comparison:  Previous exam(s).  FINDINGS:  ACR Breast Density Category b:  There are scattered areas of fibroglandular density.  There are no findings suspicious for malignancy.  Images were processed with CAD.  IMPRESSION: No mammographic evidence of malignancy.  A result letter of this screening mammogram will be mailed directly to the patient.  RECOMMENDATION: Screening mammogram in one year. (Code:SM-B-01Y)  BI-RADS CATEGORY 1:  Negative.   Original Report Authenticated By: Skipper Cliche, M.D.       Assessment & Plan:   Problem List Items Addressed This Visit   GERD     Hx food impaction reviewed (2006) -  Currently previously on aciphex (her preferred PPI), but controlled on rx H2B at present  continue same and pt to call if problems for re-eval by GI prn    Relevant Medications      ranitidine (ZANTAC) tablet   HYPERTENSION      BP Readings from Last 3 Encounters:  12/01/13 128/84  05/23/13 118/76  02/28/13 130/84   The current medical regimen is effective;  continue present plan and medications.     Relevant Medications      losartan (COZAAR) tablet   OSA (obstructive sleep apnea)     Diagnosis clarified with sleep study 02/09/2012 -pulmonary evaluation for same reviewed Begin CPAP October 2013, symptoms much improved The current medical regimen is effective;  continue present plan and medications.  Encourage work on [employer] loss efforts  as ongoing    Unspecified vitamin D deficiency - Primary     On OTC vit D since dx 08/2013 Recheck level now and consider rx if still low Also check DEXA    Relevant Orders      Vit D  25 hydroxy (rtn osteoporosis monitoring)      DG Bone Density

## 2013-12-01 NOTE — Progress Notes (Signed)
Pre visit review using our clinic review tool, if applicable. No additional management support is needed unless otherwise documented below in the visit note. 

## 2013-12-01 NOTE — Assessment & Plan Note (Signed)
Diagnosis clarified with sleep study 02/09/2012 -pulmonary evaluation for same reviewed Begin CPAP October 2013, symptoms much improved The current medical regimen is effective;  continue present plan and medications.  Encourage work on [employer] loss efforts as ongoing

## 2013-12-01 NOTE — Assessment & Plan Note (Signed)
On OTC vit D since dx 08/2013 Recheck level now and consider rx if still low Also check DEXA

## 2013-12-02 ENCOUNTER — Encounter: Payer: Self-pay | Admitting: Internal Medicine

## 2013-12-16 ENCOUNTER — Ambulatory Visit (INDEPENDENT_AMBULATORY_CARE_PROVIDER_SITE_OTHER)
Admission: RE | Admit: 2013-12-16 | Discharge: 2013-12-16 | Disposition: A | Discharge status: Home / Self Care | Payer: BC Managed Care – PPO | Source: Ambulatory Visit | Attending: Internal Medicine | Admitting: Internal Medicine

## 2013-12-16 DIAGNOSIS — E559 Vitamin D deficiency, unspecified: Secondary | ICD-10-CM

## 2013-12-16 DIAGNOSIS — Z13820 Encounter for screening for osteoporosis: Secondary | ICD-10-CM

## 2014-02-23 ENCOUNTER — Other Ambulatory Visit: Payer: Self-pay

## 2014-02-23 DIAGNOSIS — Z803 Family history of malignant neoplasm of breast: Secondary | ICD-10-CM

## 2014-02-23 DIAGNOSIS — Z1239 Encounter for other screening for malignant neoplasm of breast: Secondary | ICD-10-CM

## 2014-03-14 ENCOUNTER — Ambulatory Visit
Admission: RE | Admit: 2014-03-14 | Discharge: 2014-03-14 | Disposition: A | Discharge status: Home / Self Care | Payer: BC Managed Care – PPO | Source: Ambulatory Visit

## 2014-03-14 ENCOUNTER — Other Ambulatory Visit: Payer: Self-pay

## 2014-03-14 DIAGNOSIS — Z1231 Encounter for screening mammogram for malignant neoplasm of breast: Secondary | ICD-10-CM

## 2014-03-14 DIAGNOSIS — Z803 Family history of malignant neoplasm of breast: Secondary | ICD-10-CM

## 2014-04-03 ENCOUNTER — Ambulatory Visit: Payer: BC Managed Care – PPO | Admitting: Internal Medicine

## 2014-06-08 ENCOUNTER — Ambulatory Visit: Payer: BC Managed Care – PPO | Admitting: Internal Medicine

## 2014-06-15 ENCOUNTER — Ambulatory Visit (INDEPENDENT_AMBULATORY_CARE_PROVIDER_SITE_OTHER): Payer: BC Managed Care – PPO | Admitting: Internal Medicine

## 2014-06-15 ENCOUNTER — Encounter: Payer: Self-pay | Admitting: Internal Medicine

## 2014-06-15 VITALS — BP 124/78 | HR 62 | Temp 98.1°F | Ht 66.0 in | Wt 263.0 lb

## 2014-06-15 DIAGNOSIS — G4733 Obstructive sleep apnea (adult) (pediatric): Secondary | ICD-10-CM

## 2014-06-15 DIAGNOSIS — R7301 Impaired fasting glucose: Secondary | ICD-10-CM

## 2014-06-15 DIAGNOSIS — R140 Abdominal distension (gaseous): Secondary | ICD-10-CM | POA: Insufficient documentation

## 2014-06-15 DIAGNOSIS — E669 Obesity, unspecified: Secondary | ICD-10-CM

## 2014-06-15 DIAGNOSIS — I10 Essential (primary) hypertension: Secondary | ICD-10-CM

## 2014-06-15 DIAGNOSIS — Z0000 Encounter for general adult medical examination without abnormal findings: Secondary | ICD-10-CM | POA: Insufficient documentation

## 2014-06-15 NOTE — Assessment & Plan Note (Addendum)
She needs to exercise and reminded her about that. She has changed her diet. Will check HgA1c as borderline 1.5 years ago.

## 2014-06-15 NOTE — Assessment & Plan Note (Signed)
CPAP at night time going well.

## 2014-06-15 NOTE — Assessment & Plan Note (Signed)
Check lipid panel, HgA1c, BMP. Up to date on mammogram, colonoscopy, immunizations.

## 2014-06-15 NOTE — Progress Notes (Signed)
   Subjective:    Patient ID: Shannon Navarro, female    DOB: May 21, 1955, 59 y.o.   MRN: [id number]  HPI The patient is a 59 YO female who is here for wellness. She is still struggling with her weight. She has switched jobs in the last year and this has caused her to be less active. She does fairly well with diet but exercise is not something she does. Takes only the blood pressure medicine and does well with that.   PMH reviewed and updated, [facility] updated, social history updated.   Review of Systems  Constitutional: Negative for fever, activity change, appetite change, fatigue and unexpected weight change.  HENT: Negative.   Eyes: Negative.   Respiratory: Negative for cough, chest tightness, shortness of breath and wheezing.   Cardiovascular: Negative for chest pain, palpitations and leg swelling.  Gastrointestinal: Negative for nausea, abdominal pain, diarrhea, constipation and abdominal distention.  Musculoskeletal: Negative.   Skin: Negative.   Neurological: Negative.   Psychiatric/Behavioral: Negative.       Objective:   Physical Exam  Constitutional: She is oriented to person, place, and time. She appears well-developed and well-nourished.  HENT:  Head: Normocephalic and atraumatic.  Eyes: EOM are normal.  Neck: Normal range of motion.  Cardiovascular: Normal rate and regular rhythm.   Pulmonary/Chest: Effort normal and breath sounds normal. No respiratory distress. She has no wheezes. She has no rales.  Abdominal: Soft. Bowel sounds are normal. She exhibits no distension. There is no tenderness. There is no rebound.  Musculoskeletal: She exhibits no edema.  Neurological: She is alert and oriented to person, place, and time. Coordination normal.  Skin: Skin is warm and dry.   Filed Vitals:   06/15/14 1528  BP: 124/78  Pulse: 62  Temp: 98.1 F (36.7 C)  TempSrc: Oral  Height: 5\' 6"  (1.676 m)  Weight: 263 lb (119.296 kg)  SpO2: 98%      Assessment & Plan:

## 2014-06-15 NOTE — Patient Instructions (Signed)
We will check an ultrasound of the stomach to make sure that there are no problems with the gall bladder or liver or kidneys.   We will have you come back when you are fasting for your blood work and we will call you back with the results.   Come back in about 6 months and we will check on the weight.   Work on the exercise and if you are having problems with the sugars some of those medicines are good for weight loss.   Diabetes and Exercise Exercising regularly is important. It is not just about losing weight. It has many health benefits, such as:  Improving your overall fitness, flexibility, and endurance.  Increasing your bone density.  Helping with weight control.  Decreasing your body fat.  Increasing your muscle strength.  Reducing stress and tension.  Improving your overall health. People with diabetes who exercise gain additional benefits because exercise:  Reduces appetite.  Improves the body's use of blood sugar (glucose).  Helps lower or control blood glucose.  Decreases blood pressure.  Helps control blood lipids (such as cholesterol and triglycerides).  Improves the body's use of the hormone insulin by:  Increasing the body's insulin sensitivity.  Reducing the body's insulin needs.  Decreases the risk for heart disease because exercising:  Lowers cholesterol and triglycerides levels.  Increases the levels of good cholesterol (such as high-density lipoproteins [HDL]) in the body.  Lowers blood glucose levels. YOUR ACTIVITY PLAN  Choose an activity that you enjoy and set realistic goals. Your health care provider or diabetes educator can help you make an activity plan that works for you. Exercise regularly as directed by your health care provider. This includes:  Performing resistance training twice a week such as push-ups, sit-ups, lifting weights, or using resistance bands.  Performing 150 minutes of cardio exercises each week such as walking,  running, or playing sports.  Staying active and spending no more than 90 minutes at one time being inactive. Even short bursts of exercise are good for you. Three 10-minute sessions spread throughout the day are just as beneficial as a single 30-minute session. Some exercise ideas include:  Taking the dog for a walk.  Taking the stairs instead of the elevator.  Dancing to your favorite song.  Doing an exercise video.  Doing your favorite exercise with a friend. RECOMMENDATIONS FOR EXERCISING WITH TYPE 1 OR TYPE 2 DIABETES   Check your blood glucose before exercising. If blood glucose levels are greater than 240 mg/dL, check for urine ketones. Do not exercise if ketones are present.  Avoid injecting insulin into areas of the body that are going to be exercised. For example, avoid injecting insulin into:  The arms when playing tennis.  The legs when jogging.  Keep a record of:  Food intake before and after you exercise.  Expected peak times of insulin action.  Blood glucose levels before and after you exercise.  The type and amount of exercise you have done.  Review your records with your health care provider. Your health care provider will help you to develop guidelines for adjusting food intake and insulin amounts before and after exercising.  If you take insulin or oral hypoglycemic agents, watch for signs and symptoms of hypoglycemia. They include:  Dizziness.  Shaking.  Sweating.  Chills.  Confusion.  Drink plenty of water while you exercise to prevent dehydration or heat stroke. Body water is lost during exercise and must be replaced.  Talk to your health  care provider before starting an exercise program to make sure it is safe for you. Remember, almost any type of activity is better than none. Document Released: 07/19/2003 Document Revised: 09/12/2013 Document Reviewed: 10/05/2012 [facility] Patient Information 2015 [place], [place]. This information is not  intended to replace advice given to you by your health care provider. Make sure you discuss any questions you have with your health care provider.

## 2014-06-15 NOTE — Assessment & Plan Note (Signed)
Check abdominal US, new symptom in the last month. Concern for gall bladder problems.

## 2014-06-15 NOTE — Assessment & Plan Note (Signed)
Losartan 100 mg doing well, check BMP today.

## 2014-06-15 NOTE — Progress Notes (Signed)
Pre visit review using our clinic review tool, if applicable. No additional management support is needed unless otherwise documented below in the visit note. 

## 2014-06-15 NOTE — Assessment & Plan Note (Signed)
Check HgA1c as not checked in some time.

## 2014-06-23 ENCOUNTER — Other Ambulatory Visit: Payer: Self-pay | Admitting: Geriatric Medicine

## 2014-06-23 ENCOUNTER — Other Ambulatory Visit (INDEPENDENT_AMBULATORY_CARE_PROVIDER_SITE_OTHER): Payer: BC Managed Care – PPO

## 2014-06-23 DIAGNOSIS — E559 Vitamin D deficiency, unspecified: Secondary | ICD-10-CM

## 2014-06-23 DIAGNOSIS — Z0000 Encounter for general adult medical examination without abnormal findings: Secondary | ICD-10-CM

## 2014-06-23 LAB — COMPREHENSIVE METABOLIC PANEL
ALBUMIN: 3.8 g/dL (ref 3.5–5.2)
ALT: 25 U/L (ref 0–35)
AST: 17 U/L (ref 0–37)
Alkaline Phosphatase: 67 U/L (ref 39–117)
BUN: 19 mg/dL (ref 6–23)
CALCIUM: 9.3 mg/dL (ref 8.4–10.5)
CHLORIDE: 106 meq/L (ref 96–112)
CO2: 29 mEq/L (ref 19–32)
Creatinine, Ser: 0.84 mg/dL (ref 0.40–1.20)
GFR: 74.19 mL/min (ref >60.00)
Glucose, Bld: 104 mg/dL — ABNORMAL HIGH (ref 70–99)
POTASSIUM: 3.8 meq/L (ref 3.5–5.1)
SODIUM: 140 meq/L (ref 135–145)
Total Bilirubin: 1 mg/dL (ref 0.2–1.2)
Total Protein: 7.1 g/dL (ref 6.0–8.3)

## 2014-06-23 LAB — LIPID PANEL
CHOLESTEROL: 148 mg/dL (ref 0–200)
HDL: 36.7 mg/dL — AB (ref >39.00)
LDL CALC: 95 mg/dL (ref 0–99)
NonHDL: 111.3
TRIGLYCERIDES: 80 mg/dL (ref 0.0–149.0)
Total CHOL/HDL Ratio: 4
VLDL: 16 mg/dL (ref 0.0–40.0)

## 2014-06-23 LAB — TSH: TSH: 1.76 u[IU]/mL (ref 0.35–4.50)

## 2014-06-23 LAB — VITAMIN D 25 HYDROXY (VIT D DEFICIENCY, FRACTURES): VITD: 48.97 ng/mL (ref 30.00–100.00)

## 2014-06-23 LAB — HEMOGLOBIN A1C: Hgb A1c MFr Bld: 5.9 % (ref 4.6–6.5)

## 2014-06-27 ENCOUNTER — Other Ambulatory Visit: Payer: Self-pay | Admitting: Internal Medicine

## 2014-06-29 ENCOUNTER — Ambulatory Visit
Admission: RE | Admit: 2014-06-29 | Discharge: 2014-06-29 | Disposition: A | Discharge status: Home / Self Care | Payer: BC Managed Care – PPO | Source: Ambulatory Visit | Attending: Internal Medicine | Admitting: Internal Medicine

## 2014-06-29 DIAGNOSIS — R140 Abdominal distension (gaseous): Secondary | ICD-10-CM

## 2014-06-30 ENCOUNTER — Telehealth: Payer: Self-pay | Admitting: Internal Medicine

## 2014-06-30 NOTE — Telephone Encounter (Signed)
Patient has questions about her recent diagnosis of a fatty liver found on her recent ABD [place]. Patient wants to know if she should have her gallbladder checked and she also wants to know if she can take milk thistle for her fatty liver. She is concerned and wants to know if she should have an MRI. She wants to know if any further testing needs to be done. She said she is also concerned about her bilirubin lab result. She wants to know if there is anything else she needs to do. Please advise, thanks.

## 2014-06-30 NOTE — Telephone Encounter (Signed)
I spoke with patient and she understands now and will try to lose weight.

## 2014-06-30 NOTE — Telephone Encounter (Signed)
Her bilirubin was normal. Her gallbladder was evaluated on the ultrasound and it did not have any stones or sludge so was not having any problems. The liver does not need any evaluation at this time. It is an incidental finding that likely she has had for some time. The only treatment that has been shown to help the liver is weight loss and exercise. All her liver tests show normal liver function. We will continue to monitor with lab work every 6-12 months.

## 2014-06-30 NOTE — Telephone Encounter (Signed)
Pt request phone call from the assistant concern about the test result that she got. Please call her

## 2014-08-02 ENCOUNTER — Telehealth: Payer: Self-pay | Admitting: Pulmonary Disease

## 2014-08-02 NOTE — Telephone Encounter (Signed)
Shannon Navarro, did you receive a form from [facility] for this patient?

## 2014-08-03 NOTE — Telephone Encounter (Signed)
[place] [facility] [phone] An order is being re-faxed to be signed by Dr Halford Chessman and faxed back.  Will await fax.

## 2014-08-07 NOTE — Telephone Encounter (Signed)
Has this fax been received? Thanks.

## 2014-08-08 NOTE — Telephone Encounter (Signed)
Spoke with [facility], states that an order was faxed to our office for Dr Halford Chessman to sign. Advised that we have not received this and will need this re-faxed. This is being faxed to triage today.

## 2014-08-08 NOTE — Telephone Encounter (Signed)
Order received for CPAP supplies. This has been placed in Dr Juanetta Gosling look at to be signed.  VS- please advise. Thanks.

## 2014-08-10 NOTE — Telephone Encounter (Signed)
I have signed form and will drop in office on 08/11/14.

## 2014-08-14 NOTE — Telephone Encounter (Signed)
Shannon Navarro, please advise if you received this. Thanks.

## 2014-08-17 NOTE — Telephone Encounter (Signed)
Form faxed back to [facility]. [phone]  Spoke with [facility] and made aware this has been faxed back --[place] to be made known this has been faxed. Nothing further needed.

## 2014-12-15 ENCOUNTER — Ambulatory Visit: Payer: BC Managed Care – PPO | Admitting: Internal Medicine

## 2015-02-08 ENCOUNTER — Ambulatory Visit: Payer: BC Managed Care – PPO | Admitting: Internal Medicine

## 2015-02-09 ENCOUNTER — Other Ambulatory Visit: Payer: Self-pay

## 2015-02-09 ENCOUNTER — Other Ambulatory Visit: Payer: Self-pay | Admitting: Internal Medicine

## 2015-02-09 DIAGNOSIS — Z1231 Encounter for screening mammogram for malignant neoplasm of breast: Secondary | ICD-10-CM

## 2015-03-16 ENCOUNTER — Ambulatory Visit
Admission: RE | Admit: 2015-03-16 | Discharge: 2015-03-16 | Disposition: A | Discharge status: Home / Self Care | Payer: BC Managed Care – PPO | Source: Ambulatory Visit

## 2015-03-16 DIAGNOSIS — Z1231 Encounter for screening mammogram for malignant neoplasm of breast: Secondary | ICD-10-CM

## 2015-04-10 ENCOUNTER — Encounter: Payer: Self-pay | Admitting: Internal Medicine

## 2015-04-10 ENCOUNTER — Ambulatory Visit (INDEPENDENT_AMBULATORY_CARE_PROVIDER_SITE_OTHER): Payer: BC Managed Care – PPO | Admitting: Internal Medicine

## 2015-04-10 VITALS — BP 130/78 | HR 61 | Temp 98.3°F | Resp 16 | Ht 66.0 in | Wt 271.0 lb

## 2015-04-10 DIAGNOSIS — Z0000 Encounter for general adult medical examination without abnormal findings: Secondary | ICD-10-CM

## 2015-04-10 DIAGNOSIS — R001 Bradycardia, unspecified: Secondary | ICD-10-CM | POA: Diagnosis not present

## 2015-04-10 NOTE — Progress Notes (Signed)
Pre visit review using our clinic review tool, if applicable. No additional management support is needed unless otherwise documented below in the visit note. 

## 2015-04-10 NOTE — Patient Instructions (Signed)
We will send you to the cardiologist to check out the heart rate especially at night time.   You can take dramamine for seasickness if needed on the cruise.   Remember to walk or walk laps in the pool to keep the weight stable on the cruise.   We have put in your labs that you can get down before your next visit some morning.  Exercising to Lose Weight Exercising can help you to lose weight. In order to lose weight through exercise, you need to do vigorous-intensity exercise. You can tell that you are exercising with vigorous intensity if you are breathing very hard and fast and cannot hold a conversation while exercising. Moderate-intensity exercise helps to maintain your current weight. You can tell that you are exercising at a moderate level if you have a higher heart rate and faster breathing, but you are still able to hold a conversation. HOW OFTEN SHOULD I EXERCISE? Choose an activity that you enjoy and set realistic goals. Your health care provider can help you to make an activity plan that works for you. Exercise regularly as directed by your health care provider. This may include:  Doing resistance training twice each week, such as:  Push-ups.  Sit-ups.  Lifting weights.  Using resistance bands.  Doing a given intensity of exercise for a given amount of time. Choose from these options:  150 minutes of moderate-intensity exercise every week.  75 minutes of vigorous-intensity exercise every week.  A mix of moderate-intensity and vigorous-intensity exercise every week. Children, pregnant women, people who are out of shape, people who are overweight, and older adults may need to consult a health care provider for individual recommendations. If you have any sort of medical condition, be sure to consult your health care provider before starting a new exercise program. WHAT ARE SOME ACTIVITIES THAT CAN HELP ME TO LOSE WEIGHT?   Walking at a rate of at least 4.5 miles an  hour.  Jogging or running at a rate of 5 miles per hour.  Biking at a rate of at least 10 miles per hour.  Lap swimming.  Roller-skating or in-line skating.  Cross-country skiing.  Vigorous competitive sports, such as football, basketball, and soccer.  Jumping rope.  Aerobic dancing. HOW CAN I BE MORE ACTIVE IN MY DAY-TO-DAY ACTIVITIES?  Use the stairs instead of the elevator.  Take a walk during your lunch break.  If you drive, park your car farther away from work or school.  If you take public transportation, get off one stop early and walk the rest of the way.  Make all of your phone calls while standing up and walking around.  Get up, stretch, and walk around every 30 minutes throughout the day. WHAT GUIDELINES SHOULD I FOLLOW WHILE EXERCISING?  Do not exercise so much that you hurt yourself, feel dizzy, or get very short of breath.  Consult your health care provider prior to starting a new exercise program.  Wear comfortable clothes and shoes with good support.  Drink plenty of water while you exercise to prevent dehydration or heat stroke. Body water is lost during exercise and must be replaced.  Work out until you breathe faster and your heart beats faster.   This information is not intended to replace advice given to you by your health care provider. Make sure you discuss any questions you have with your health care provider.   Document Released: 05/31/2010 Document Revised: 05/19/2014 Document Reviewed: 09/29/2013 Elsevier Interactive Patient Education 2016  [employer].

## 2015-04-11 DIAGNOSIS — R001 Bradycardia, unspecified: Secondary | ICD-10-CM | POA: Insufficient documentation

## 2015-04-11 NOTE — Assessment & Plan Note (Signed)
Talked to her about the need for exercise to help herself get healthier and have more energy. Previous labs checked and normal thyroid. Talked to her about water aerobics as a better way with joint pain to be active.

## 2015-04-11 NOTE — Progress Notes (Signed)
   Subjective:    Patient ID: Shannon Navarro, female    DOB: [birth year], 59 y.o.   MRN: [id number]  HPI The patient is a 59 YO female coming in for concerns about her low heart rate. She has had low heart rate for the last several years. She has recently been having spells where she feels slightly dizzy and is worried that the heart rate is getting too low. She also wonders if it drops overnight. She does have OSA and uses her CPAP nightly. Feels fatigued during the day and this keeps her from exercising. She is working on losing weight and is not able to. Does not exercise at all. There is a strong family history of heart problems and her father died of heart problems.   Review of Systems  Constitutional: Positive for fatigue. Negative for fever, activity change, appetite change and unexpected weight change.  Respiratory: Negative for cough, chest tightness, shortness of breath and wheezing.   Cardiovascular: Negative for chest pain, palpitations and leg swelling.  Gastrointestinal: Negative for nausea, abdominal pain, diarrhea, constipation and abdominal distention.  Musculoskeletal: Negative.   Skin: Negative.   Neurological: Positive for dizziness and light-headedness. Negative for weakness.  Psychiatric/Behavioral: Negative.       Objective:   Physical Exam  Constitutional: She is oriented to person, place, and time. She appears well-developed and well-nourished.  HENT:  Head: Normocephalic and atraumatic.  Eyes: EOM are normal.  Neck: Normal range of motion.  Cardiovascular: Normal rate and regular rhythm.   Pulmonary/Chest: Effort normal and breath sounds normal. No respiratory distress. She has no wheezes. She has no rales.  Abdominal: Soft. Bowel sounds are normal. She exhibits no distension. There is no tenderness. There is no rebound.  Musculoskeletal: She exhibits no edema.  Neurological: She is alert and oriented to person, place, and time. Coordination normal.  Skin:  Skin is warm and dry.   Filed Vitals:   04/10/15 1617  BP: 130/78  Pulse: 61  Temp: 98.3 F (36.8 C)  TempSrc: Oral  Resp: 16  Height: 5\' 6"  (1.676 m)  Weight: 271 lb (122.925 kg)  SpO2: 98%      Assessment & Plan:

## 2015-04-11 NOTE — Assessment & Plan Note (Signed)
She is normal rate today (on the low side) and per chart review all EKG are normal sinus. Several readings in the last 3 years in the 69s. Given her significant family history of heart disease and recent (last 4 years) decreasing pulse rate to 60s and 50s will refer to cardiology for consideration of holter monitoring and possible stress test to evaluate for ischemia.

## 2015-05-24 ENCOUNTER — Ambulatory Visit (INDEPENDENT_AMBULATORY_CARE_PROVIDER_SITE_OTHER): Payer: BC Managed Care – PPO | Admitting: Cardiology

## 2015-05-24 ENCOUNTER — Encounter: Payer: Self-pay | Admitting: Cardiology

## 2015-05-24 VITALS — BP 140/98 | HR 55 | Ht 66.0 in | Wt 266.8 lb

## 2015-05-24 DIAGNOSIS — R001 Bradycardia, unspecified: Secondary | ICD-10-CM

## 2015-05-24 NOTE — Progress Notes (Signed)
Cardiology Office Note   Date:  05/24/2015   ID:  Shannon Navarro, DOB 06-12-1955, MRN [id number]  PCP/referring MD:  [name], MD  Cardiologist:   Minus Breeding, MD    No chief complaint on file.     History of Present Illness: Shannon Navarro is a 60 y.o. female who presents for evaluation of bradycardia.  She has no prior cardiac history.  She has has bradycardia noted but has no past history of presyncope or syncope.  She is not overly active but she does her activities of daily living. With this she denies any cardiovascular symptoms such as chest pressure, neck or arm discomfort. She doesn't have any palpitations. She has no PND or orthopnea. She has been getting short of breath walking up an incline from her mailbox.    Past Medical History  Diagnosis Date  . Obesity, unspecified   . HYPERGLYCEMIA   . Migraine   . Shingles   . ALLERGIC RHINITIS   . ANXIETY   . HYPERTENSION   . Menopause     @ 46  . Pseudotumor cerebri 2007  . GERD 07/2004    hx food impaction  . OSA (obstructive sleep apnea) 01/16/2012    started CPAP 02/2012  . Hiatal hernia   . Fatty liver 06/23/07  . Vitamin D deficiency 07/2013    Past Surgical History  Procedure Laterality Date  . Btl  1995  . Dilation and curettage of uterus  2010  . Colonoscopy    . Upper gastrointestinal endoscopy       Current Outpatient Prescriptions  Medication Sig Dispense Refill  . Cholecalciferol (VITAMIN D3) 5000 UNITS TABS Take 5,000 Units by mouth daily. 30 tablet   . fluticasone (FLONASE) 50 MCG/ACT nasal spray Place 1 spray into both nostrils as needed. 0.05 g 3  . loratadine (CLARITIN) 10 MG tablet Take 1 tablet (10 mg total) by mouth daily as needed for allergies. 30 tablet 2  . losartan (COZAAR) 100 MG tablet TAKE 1 TABLET DAILY 90 tablet 3  . ranitidine (ZANTAC) 300 MG tablet Take 1 tablet (300 mg total) by mouth daily. 90 tablet 3  . venlafaxine XR (EFFEXOR-XR) 75 MG 24 hr capsule  TAKE 1 CAPSULE DAILY 90 capsule 3   No current facility-administered medications for this visit.    Allergies:   Cefaclor; Codeine; Hydrocodone; Hydrocodone-acetaminophen; Levofloxacin; Nexium; Penicillins; and Sulfonamide derivatives    Social History:  The patient  reports that she has never smoked. She has never used smokeless tobacco. She reports that she does not drink alcohol or use illicit drugs.   Family History:  The patient's family history includes Aortic stenosis in her father; Asthma in her mother; Breast cancer in her mother; Colon polyps in her father; Diabetes in her father; Heart disease in her father; Hypertension in her father and mother; Prostate cancer in her father. There is no history of Colon cancer, Esophageal cancer, Stomach cancer, or Rectal cancer.    ROS:  Please see the history of present illness.   Otherwise, review of systems are positive for none.   All other systems are reviewed and negative.    PHYSICAL EXAM: VS:  BP 140/98 mmHg  Pulse 55  Ht 5\' 6"  (1.676 m)  Wt 266 lb 12.8 oz (121.02 kg)  BMI 43.08 kg/m2 , BMI Body mass index is 43.08 kg/(m^2). GENERAL:  Well appearing HEENT:  Pupils equal round and reactive, fundi not visualized, oral mucosa unremarkable  NECK:  No jugular venous distention, waveform within normal limits, carotid upstroke brisk and symmetric, no bruits, no thyromegaly LYMPHATICS:  No cervical, inguinal adenopathy LUNGS:  Clear to auscultation bilaterally BACK:  No CVA tenderness CHEST:  Unremarkable HEART:  PMI not displaced or sustained,S1 and S2 within normal limits, no S3, no S4, no clicks, no rubs, no murmurs ABD:  Flat, positive bowel sounds normal in frequency in pitch, no bruits, no rebound, no guarding, no midline pulsatile mass, no hepatomegaly, no splenomegaly EXT:  2 plus pulses throughout, no edema, no cyanosis no clubbing SKIN:  No rashes no nodules NEURO:  Cranial nerves II through XII grossly intact, motor grossly  intact throughout PSYCH:  Cognitively intact, oriented to person place and time    EKG:  EKG is ordered today. The EKG ordered today demonstrates NSR, rate 55, axis WNL, intervals WNL, no acute ST T wave changes.     Recent Labs: 06/23/2014: ALT 25; BUN 19; Creatinine, Ser 0.84; Potassium 3.8; Sodium 140; TSH 1.76    Lipid Panel    Component Value Date/Time   CHOL 148 06/23/2014 0750   TRIG 80.0 06/23/2014 0750   HDL 36.70* 06/23/2014 0750   CHOLHDL 4 06/23/2014 0750   VLDL 16.0 06/23/2014 0750   LDLCALC 95 06/23/2014 0750      Wt Readings from Last 3 Encounters:  05/24/15 266 lb 12.8 oz (121.02 kg)  04/10/15 271 lb (122.925 kg)  06/15/14 263 lb (119.296 kg)      Other studies Reviewed: Additional studies/ records that were reviewed today include: None. Review of the above records demonstrates:  Please see elsewhere in the note.     ASSESSMENT AND PLAN:  BRADYCARDIA:  The patient has had no presyncope or syncope. At this point I do plan to make sure that she has normal chronotropic competence with treadmill testing. She's had a normal TSH last year. I don't think other testing would be indicated if her heart rate improves with activity.  DYSPNEA:  She does have some dyspnea with exertion. She has a family history of heart disease. I will bring the patient back for a POET (Plain Old Exercise Test). This will allow me to screen for obstructive coronary disease, risk stratify and very importantly provide a prescription for exercise.    Current medicines are reviewed at length with the patient today.  The patient does not have concerns regarding medicines.  The following changes have been made:  no change  Labs/ tests ordered today include:   Orders Placed This Encounter  Procedures  . Exercise Tolerance Test  . EKG 12-Lead     Disposition:   FU with me as needed.      Signed, Minus Breeding, MD  05/24/2015 5:43 PM    [name]

## 2015-05-24 NOTE — Patient Instructions (Signed)
Medication Instructions:  Your physician recommends that you continue on your current medications as directed. Please refer to the Current Medication list given to you today.   Labwork: none  Testing/Procedures: Your physician has requested that you have an exercise tolerance test. For further information please visit [email or web address]. Please also follow instruction sheet, as given.    Follow-Up: Follow up with Dr. Hochrein as needed.   Any Other Special Instructions Will Be Listed Below (If Applicable).     If you need a refill on your cardiac medications before your next appointment, please call your pharmacy.   

## 2015-06-08 ENCOUNTER — Telehealth (HOSPITAL_COMMUNITY): Payer: Self-pay

## 2015-06-08 NOTE — Telephone Encounter (Signed)
Encounter complete. 

## 2015-06-13 ENCOUNTER — Ambulatory Visit (HOSPITAL_COMMUNITY)
Admission: RE | Admit: 2015-06-13 | Discharge: 2015-06-13 | Disposition: A | Discharge status: Home / Self Care | Payer: BC Managed Care – PPO | Source: Ambulatory Visit | Attending: Cardiovascular Disease | Admitting: Cardiovascular Disease

## 2015-06-13 DIAGNOSIS — R001 Bradycardia, unspecified: Secondary | ICD-10-CM | POA: Diagnosis present

## 2015-06-13 DIAGNOSIS — Z9289 Personal history of other medical treatment: Secondary | ICD-10-CM

## 2015-06-13 HISTORY — DX: Personal history of other medical treatment: Z92.89

## 2015-06-13 LAB — EXERCISE TOLERANCE TEST
CHL RATE OF PERCEIVED EXERTION: 17
CSEPHR: 100 %
Estimated workload: 7.2 METS
Exercise duration (min): 6 min
Exercise duration (sec): 11 s
MPHR: 162 {beats}/min
Peak HR: 162 {beats}/min
Rest HR: 59 {beats}/min

## 2015-06-18 ENCOUNTER — Telehealth: Payer: Self-pay | Admitting: *Deleted

## 2015-06-18 NOTE — Telephone Encounter (Signed)
Pt has been notified of stress test results by phone with verbal understanding

## 2015-06-20 ENCOUNTER — Encounter: Payer: Self-pay | Admitting: Internal Medicine

## 2015-06-20 ENCOUNTER — Ambulatory Visit (INDEPENDENT_AMBULATORY_CARE_PROVIDER_SITE_OTHER): Payer: BC Managed Care – PPO | Admitting: Internal Medicine

## 2015-06-20 ENCOUNTER — Other Ambulatory Visit (INDEPENDENT_AMBULATORY_CARE_PROVIDER_SITE_OTHER): Payer: BC Managed Care – PPO

## 2015-06-20 VITALS — BP 102/60 | HR 58 | Temp 98.3°F | Resp 14 | Ht 66.0 in | Wt 267.1 lb

## 2015-06-20 DIAGNOSIS — Z0000 Encounter for general adult medical examination without abnormal findings: Secondary | ICD-10-CM

## 2015-06-20 LAB — CBC
HCT: 44.4 % (ref 36.0–46.0)
Hemoglobin: 14.8 g/dL (ref 12.0–15.0)
MCHC: 33.4 g/dL (ref 30.0–36.0)
MCV: 83.8 fl (ref 78.0–100.0)
Platelets: 278 K/uL (ref 150.0–400.0)
RBC: 5.3 Mil/uL — ABNORMAL HIGH (ref 3.87–5.11)
RDW: 13.8 % (ref 11.5–15.5)
WBC: 5.9 K/uL (ref 4.0–10.5)

## 2015-06-20 LAB — COMPREHENSIVE METABOLIC PANEL WITH GFR
ALT: 18 U/L (ref 0–35)
AST: 12 U/L (ref 0–37)
Albumin: 3.9 g/dL (ref 3.5–5.2)
Alkaline Phosphatase: 74 U/L (ref 39–117)
BUN: 13 mg/dL (ref 6–23)
CO2: 24 meq/L (ref 19–32)
Calcium: 9.4 mg/dL (ref 8.4–10.5)
Chloride: 109 meq/L (ref 96–112)
Creatinine, Ser: 0.79 mg/dL (ref 0.40–1.20)
GFR: 79.35 mL/min
Glucose, Bld: 122 mg/dL — ABNORMAL HIGH (ref 70–99)
Potassium: 4.3 meq/L (ref 3.5–5.1)
Sodium: 142 meq/L (ref 135–145)
Total Bilirubin: 0.5 mg/dL (ref 0.2–1.2)
Total Protein: 7.3 g/dL (ref 6.0–8.3)

## 2015-06-20 LAB — LIPID PANEL
CHOL/HDL RATIO: 4
Cholesterol: 172 mg/dL (ref 0–200)
HDL: 43.1 mg/dL (ref >39.00)
LDL CALC: 108 mg/dL — AB (ref 0–99)
NonHDL: 128.57
TRIGLYCERIDES: 103 mg/dL (ref 0.0–149.0)
VLDL: 20.6 mg/dL (ref 0.0–40.0)

## 2015-06-20 LAB — VITAMIN D 25 HYDROXY (VIT D DEFICIENCY, FRACTURES): VITD: 46 ng/mL (ref 30.00–100.00)

## 2015-06-20 LAB — HEMOGLOBIN A1C: HEMOGLOBIN A1C: 6 % (ref 4.6–6.5)

## 2015-06-20 MED ORDER — PREDNISONE 20 MG PO TABS: 40.0000 mg | ORAL_TABLET | Freq: Every day | ORAL | Status: DC

## 2015-06-20 NOTE — Progress Notes (Signed)
Pre visit review using our clinic review tool, if applicable. No additional management support is needed unless otherwise documented below in the visit note. 

## 2015-06-20 NOTE — Patient Instructions (Signed)
We have sent in the prednisone for the sinuses to help them dry up. Take 2 pills daily for the next 5 days then stop.  We will check the labs and call you back with the results.

## 2015-06-20 NOTE — Progress Notes (Signed)
   Subjective:    Patient ID: Shannon Navarro, female    DOB: Apr 22, 1956, 60 y.o.   MRN: [id number]  HPI The patient is a 60 YO female coming in for wellness. No new concerns although she does have a mild cough and cold right now.   PMH, [facility], social history reviewed and updated.   Review of Systems  Constitutional: Negative for fever, activity change, appetite change, fatigue and unexpected weight change.  HENT: Positive for congestion.   Respiratory: Positive for cough. Negative for chest tightness, shortness of breath and wheezing.   Cardiovascular: Negative for chest pain, palpitations and leg swelling.  Gastrointestinal: Negative for nausea, abdominal pain, diarrhea, constipation and abdominal distention.  Musculoskeletal: Negative.   Skin: Negative.   Neurological: Negative for dizziness, weakness and light-headedness.  Psychiatric/Behavioral: Negative.       Objective:   Physical Exam  Constitutional: She is oriented to person, place, and time. She appears well-developed and well-nourished.  HENT:  Head: Normocephalic and atraumatic.  Eyes: EOM are normal.  Neck: Normal range of motion.  Cardiovascular: Normal rate and regular rhythm.   Pulmonary/Chest: Effort normal and breath sounds normal. No respiratory distress. She has no wheezes. She has no rales.  Abdominal: Soft. Bowel sounds are normal. She exhibits no distension. There is no tenderness. There is no rebound.  Musculoskeletal: She exhibits no edema.  Neurological: She is alert and oriented to person, place, and time. Coordination normal.  Skin: Skin is warm and dry.   Filed Vitals:   06/20/15 0816  BP: 102/60  Pulse: 58  Temp: 98.3 F (36.8 C)  TempSrc: Oral  Resp: 14  Height: 5\' 6"  (1.676 m)  Weight: 267 lb 1.9 oz (121.165 kg)  SpO2: 97%      Assessment & Plan:

## 2015-06-21 LAB — HEPATITIS C ANTIBODY: HCV Ab: NEGATIVE

## 2015-06-22 NOTE — Assessment & Plan Note (Signed)
Immunizations up to date. No new concerns. Some conservative treatment for her cold symptoms. Checking labs, non-smoker. Advised that she needs exercise.

## 2015-06-22 NOTE — Assessment & Plan Note (Signed)
Talked to her again about the need for exercise and some dietary changes to help with her weight. She is not very motivated for change now.

## 2015-06-23 ENCOUNTER — Other Ambulatory Visit: Payer: Self-pay | Admitting: Internal Medicine

## 2015-10-16 ENCOUNTER — Ambulatory Visit (INDEPENDENT_AMBULATORY_CARE_PROVIDER_SITE_OTHER): Payer: BC Managed Care – PPO | Admitting: Internal Medicine

## 2015-10-16 ENCOUNTER — Encounter: Payer: Self-pay | Admitting: Internal Medicine

## 2015-10-16 VITALS — BP 144/80 | HR 59 | Temp 98.3°F | Wt 269.0 lb

## 2015-10-16 DIAGNOSIS — J0100 Acute maxillary sinusitis, unspecified: Secondary | ICD-10-CM

## 2015-10-16 DIAGNOSIS — K219 Gastro-esophageal reflux disease without esophagitis: Secondary | ICD-10-CM | POA: Diagnosis not present

## 2015-10-16 DIAGNOSIS — J019 Acute sinusitis, unspecified: Secondary | ICD-10-CM | POA: Insufficient documentation

## 2015-10-16 DIAGNOSIS — J329 Chronic sinusitis, unspecified: Secondary | ICD-10-CM | POA: Insufficient documentation

## 2015-10-16 MED ORDER — RANITIDINE HCL 300 MG PO TABS: 300.0000 mg | ORAL_TABLET | Freq: Every day | ORAL | Status: DC

## 2015-10-16 MED ORDER — CLARITHROMYCIN 500 MG PO TABS: 500.0000 mg | ORAL_TABLET | Freq: Two times a day (BID) | ORAL | Status: DC

## 2015-10-16 NOTE — Assessment & Plan Note (Signed)
Zantac renewed

## 2015-10-16 NOTE — Progress Notes (Signed)
Pre visit review using our clinic review tool, if applicable. No additional management support is needed unless otherwise documented below in the visit note. 

## 2015-10-16 NOTE — Patient Instructions (Signed)
Use over-the-counter  "cold" medicines  such as"Afrin" nasal spray for nasal congestion as directed instead. Use " Delsym" or" Robitussin" cough syrup varietis for cough.  You can use plain "Tylenol" or "Advil" for fever, chills and achyness. Use Halls or Ricola cough drops.  Please, make an appointment if you are not better or if you're worse.  

## 2015-10-16 NOTE — Progress Notes (Signed)
Subjective:  Patient ID: Shannon Navarro, female    DOB: [birth year]  Age: 60 y.o. MRN: [id number]  CC: Sinusitis and Headache   HPI Shannon Navarro presents for sinusitis sx's - bloody d/c and R ear pain  Outpatient Prescriptions Prior to Visit  Medication Sig Dispense Refill  . Cholecalciferol (VITAMIN D3) 5000 UNITS TABS Take 5,000 Units by mouth daily. 30 tablet   . fluticasone (FLONASE) 50 MCG/ACT nasal spray Place 1 spray into both nostrils as needed. 0.05 g 3  . loratadine (CLARITIN) 10 MG tablet Take 1 tablet (10 mg total) by mouth daily as needed for allergies. 30 tablet 2  . losartan (COZAAR) 100 MG tablet TAKE 1 TABLET DAILY 90 tablet 3  . ranitidine (ZANTAC) 300 MG tablet Take 1 tablet (300 mg total) by mouth daily. 90 tablet 3  . venlafaxine XR (EFFEXOR-XR) 75 MG 24 hr capsule TAKE 1 CAPSULE DAILY 90 capsule 3  . predniSONE (DELTASONE) 20 MG tablet Take 2 tablets (40 mg total) by mouth daily with breakfast. (Patient not taking: Reported on 10/16/2015) 10 tablet 0   No facility-administered medications prior to visit.    ROS Review of Systems  Constitutional: Negative for chills, activity change, appetite change, fatigue and unexpected weight change.  HENT: Positive for congestion, rhinorrhea and sinus pressure. Negative for mouth sores.   Eyes: Negative for visual disturbance.  Respiratory: Negative for cough and chest tightness.   Gastrointestinal: Negative for nausea and abdominal pain.  Genitourinary: Negative for frequency, difficulty urinating and vaginal pain.  Musculoskeletal: Negative for back pain and gait problem.  Skin: Negative for pallor and rash.  Neurological: Negative for dizziness, tremors, weakness, numbness and headaches.  Psychiatric/Behavioral: Negative for confusion and sleep disturbance.    Objective:  BP 144/80 mmHg  Pulse 59  Temp(Src) 98.3 F (36.8 C) (Oral)  Wt 269 lb (122.018 kg)  SpO2 96%  BP Readings from Last 3 Encounters:    10/16/15 144/80  06/20/15 102/60  05/24/15 140/98    Wt Readings from Last 3 Encounters:  10/16/15 269 lb (122.018 kg)  06/20/15 267 lb 1.9 oz (121.165 kg)  05/24/15 266 lb 12.8 oz (121.02 kg)    Physical Exam  Constitutional: She appears well-developed. No distress.  HENT:  Head: Normocephalic.  Right Ear: External ear normal.  Left Ear: External ear normal.  Nose: Nose normal.  Mouth/Throat: Oropharynx is clear and moist.  Eyes: Conjunctivae are normal. Pupils are equal, round, and reactive to light. Right eye exhibits no discharge. Left eye exhibits no discharge.  Neck: Normal range of motion. Neck supple. No JVD present. No tracheal deviation present. No thyromegaly present.  Cardiovascular: Normal rate, regular rhythm and normal heart sounds.   Pulmonary/Chest: No stridor. No respiratory distress. She has no wheezes.  Abdominal: Soft. Bowel sounds are normal. She exhibits no distension and no mass. There is no tenderness. There is no rebound and no guarding.  Musculoskeletal: She exhibits no edema or tenderness.  Lymphadenopathy:    She has no cervical adenopathy.  Neurological: She displays normal reflexes. No cranial nerve deficit. She exhibits normal muscle tone. Coordination normal.  Skin: No rash noted. No erythema.  Psychiatric: She has a normal mood and affect. Her behavior is normal. Judgment and thought content normal.  bloody d/c  Lab Results  Component Value Date   WBC 5.9 06/20/2015   HGB 14.8 06/20/2015   HCT 44.4 06/20/2015   PLT 278.0 06/20/2015   GLUCOSE 122* 06/20/2015  CHOL 172 06/20/2015   TRIG 103.0 06/20/2015   HDL 43.10 06/20/2015   LDLCALC 108* 06/20/2015   ALT 18 06/20/2015   AST 12 06/20/2015   NA 142 06/20/2015   K 4.3 06/20/2015   CL 109 06/20/2015   CREATININE 0.79 06/20/2015   BUN 13 06/20/2015   CO2 24 06/20/2015   TSH 1.76 06/23/2014   HGBA1C 6.0 06/20/2015    No results found.  Assessment & Plan:   There are no  diagnoses linked to this encounter. I am having Shannon Navarro maintain her Vitamin D3, loratadine, fluticasone, ranitidine, predniSONE, venlafaxine XR, and losartan.  No orders of the defined types were placed in this encounter.     Follow-up: No Follow-up on file.  Walker Kehr, MD

## 2015-10-16 NOTE — Assessment & Plan Note (Signed)
Biaxin x 10 d

## 2015-10-31 ENCOUNTER — Telehealth: Payer: Self-pay | Admitting: Internal Medicine

## 2015-10-31 MED ORDER — FLUCONAZOLE 150 MG PO TABS: 150.0000 mg | ORAL_TABLET | Freq: Once | ORAL | Status: DC

## 2015-10-31 NOTE — Telephone Encounter (Signed)
Diflucan sent to pharmacy.

## 2015-10-31 NOTE — Telephone Encounter (Signed)
Please advise in Dr. Crawford's absence, thanks.  

## 2015-10-31 NOTE — Telephone Encounter (Signed)
Patient aware.

## 2015-10-31 NOTE — Telephone Encounter (Signed)
Pt called in and she was given a strong antibiotic and now she has a yeast infection   Pharmacy -[facility] in [place]

## 2015-11-02 ENCOUNTER — Telehealth: Payer: Self-pay | Admitting: Internal Medicine

## 2016-02-10 DIAGNOSIS — I480 Paroxysmal atrial fibrillation: Secondary | ICD-10-CM

## 2016-02-10 HISTORY — DX: Paroxysmal atrial fibrillation: I48.0

## 2016-02-21 ENCOUNTER — Encounter (HOSPITAL_COMMUNITY): Payer: Self-pay | Admitting: Emergency Medicine

## 2016-02-21 ENCOUNTER — Emergency Department (HOSPITAL_COMMUNITY)
Admission: EM | Admit: 2016-02-21 | Discharge: 2016-02-21 | Disposition: A | Discharge status: Home / Self Care | Payer: BC Managed Care – PPO | Attending: Emergency Medicine | Admitting: Emergency Medicine

## 2016-02-21 ENCOUNTER — Emergency Department (HOSPITAL_COMMUNITY): Payer: BC Managed Care – PPO

## 2016-02-21 ENCOUNTER — Telehealth: Payer: Self-pay | Admitting: Cardiology

## 2016-02-21 DIAGNOSIS — I10 Essential (primary) hypertension: Secondary | ICD-10-CM | POA: Insufficient documentation

## 2016-02-21 DIAGNOSIS — Z79899 Other long term (current) drug therapy: Secondary | ICD-10-CM | POA: Insufficient documentation

## 2016-02-21 DIAGNOSIS — R002 Palpitations: Secondary | ICD-10-CM | POA: Diagnosis present

## 2016-02-21 DIAGNOSIS — I4891 Unspecified atrial fibrillation: Secondary | ICD-10-CM | POA: Insufficient documentation

## 2016-02-21 LAB — BASIC METABOLIC PANEL
ANION GAP: 6 (ref 5–15)
BUN: 15 mg/dL (ref 6–20)
CO2: 26 mmol/L (ref 22–32)
Calcium: 9 mg/dL (ref 8.9–10.3)
Chloride: 110 mmol/L (ref 101–111)
Creatinine, Ser: 0.79 mg/dL (ref 0.44–1.00)
Glucose, Bld: 128 mg/dL — ABNORMAL HIGH (ref 65–99)
Potassium: 3.6 mmol/L (ref 3.5–5.1)
Sodium: 142 mmol/L (ref 135–145)

## 2016-02-21 LAB — CBC
HCT: 44.5 % (ref 36.0–46.0)
Hemoglobin: 14.7 g/dL (ref 12.0–15.0)
MCH: 28.5 pg (ref 26.0–34.0)
MCHC: 33 g/dL (ref 30.0–36.0)
MCV: 86.2 fL (ref 78.0–100.0)
Platelets: 294 10*3/uL (ref 150–400)
RBC: 5.16 MIL/uL — AB (ref 3.87–5.11)
RDW: 13.4 % (ref 11.5–15.5)
WBC: 6.8 10*3/uL (ref 4.0–10.5)

## 2016-02-21 LAB — TROPONIN I

## 2016-02-21 MED ORDER — DILTIAZEM LOAD VIA INFUSION
15.0000 mg | Freq: Once | INTRAVENOUS | Status: AC
  Administered 2016-02-21: 15 mg via INTRAVENOUS
  Filled 2016-02-21: qty 15

## 2016-02-21 MED ORDER — RIVAROXABAN 20 MG PO TABS: 20.0000 mg | ORAL_TABLET | Freq: Every day | ORAL | 0 refills | Status: DC

## 2016-02-21 MED ORDER — RIVAROXABAN 20 MG PO TABS: 20.0000 mg | ORAL_TABLET | Freq: Once | ORAL | Status: DC

## 2016-02-21 MED ORDER — DILTIAZEM HCL-DEXTROSE 100-5 MG/100ML-% IV SOLN (PREMIX)
5.0000 mg/h | INTRAVENOUS | Status: DC
  Administered 2016-02-21: 5 mg/h via INTRAVENOUS
  Filled 2016-02-21: qty 100

## 2016-02-21 NOTE — ED Provider Notes (Signed)
[facility] DEPT Provider Note   CSN: [id number] Arrival date & time: 02/21/16  0522     History   Chief Complaint Chief Complaint  Patient presents with  . Palpitations  . Tachycardia    HPI Shannon Navarro is a 60 y.o. female.  HPI  This is a 60 year old female with a history of hypertension who presents with palpitations. Patient reports that she woke up this morning at 4 AM with palpitations. Denies shortness of breath. Denies chest pain. No prior symptoms. Reports that she drank caffeine before she went to bed last night. She does not normally drink it right before she goes to bed. She is otherwise well-appearing. No recent illnesses.  Past Medical History:  Diagnosis Date  . ALLERGIC RHINITIS   . ANXIETY   . Fatty liver 06/23/07  . GERD 07/2004   hx food impaction  . Hiatal hernia   . HYPERGLYCEMIA   . HYPERTENSION   . Menopause    @ 70  . Migraine   . Obesity, unspecified   . OSA (obstructive sleep apnea) 01/16/2012   started CPAP 02/2012  . Pseudotumor cerebri 2007  . Shingles   . Vitamin D deficiency 07/2013    Patient Active Problem List   Diagnosis Date Noted  . Acute sinusitis 10/16/2015  . Sinus bradycardia 04/11/2015  . Routine general medical examination at a health care facility 06/15/2014  . Bloating 06/15/2014  . Unspecified vitamin D deficiency   . OSA (obstructive sleep apnea) 01/16/2012  . Migraine 07/19/2010  . Essential hypertension 06/21/2010  . Impaired fasting glucose 12/10/2009  . ANXIETY 10/05/2009  . GERD 10/05/2009  . Morbid obesity ([place]) 10/03/2008  . ALLERGIC RHINITIS 10/03/2008    Past Surgical History:  Procedure Laterality Date  . BTL  1995  . COLONOSCOPY    . DILATION AND CURETTAGE OF UTERUS  2010  . UPPER GASTROINTESTINAL ENDOSCOPY      OB History    No data available       Home Medications    Prior to Admission medications   Medication Sig Start Date End Date Taking? Authorizing Provider    Cholecalciferol (VITAMIN D3) 5000 UNITS TABS Take 5,000 Units by mouth daily. 06/07/13  Yes Rowe Clack, MD  fluticasone (FLONASE) 50 MCG/ACT nasal spray Place 1 spray into both nostrils as needed. 12/01/13  Yes Rowe Clack, MD  loratadine (CLARITIN) 10 MG tablet Take 1 tablet (10 mg total) by mouth daily as needed for allergies. 12/01/13  Yes Rowe Clack, MD  losartan (COZAAR) 100 MG tablet TAKE 1 TABLET DAILY 06/25/15  Yes [name], MD  ranitidine (ZANTAC) 300 MG tablet Take 1 tablet (300 mg total) by mouth daily. 10/16/15  Yes Cassandria Anger, MD  venlafaxine XR (EFFEXOR-XR) 75 MG 24 hr capsule TAKE 1 CAPSULE DAILY 06/25/15  Yes [name], MD  clarithromycin (BIAXIN) 500 MG tablet Take 1 tablet (500 mg total) by mouth 2 (two) times daily. Patient not taking: Reported on 02/21/2016 10/16/15   Cassandria Anger, MD  fluconazole (DIFLUCAN) 150 MG tablet Take 1 tablet (150 mg total) by mouth once. May repeat in 48 hours if needed. Patient not taking: Reported on 02/21/2016 10/31/15   Golden Circle, FNP  predniSONE (DELTASONE) 20 MG tablet Take 2 tablets (40 mg total) by mouth daily with breakfast. Patient not taking: Reported on 10/16/2015 06/20/15   [name], MD  rivaroxaban (XARELTO) 20 MG TABS tablet Take 1 tablet (  20 mg total) by mouth daily with supper. 02/21/16   Merryl Hacker, MD    Family History Family History  Problem Relation Age of Onset  . Breast cancer Mother   . Asthma Mother   . Hypertension Mother   . Prostate cancer Father   . Aortic stenosis Father   . Hypertension Father   . Colon polyps Father   . Heart disease Father   . Diabetes Father   . Colon cancer Neg Hx   . Esophageal cancer Neg Hx   . Stomach cancer Neg Hx   . Rectal cancer Neg Hx     Social History Social History  Substance Use Topics  . Smoking status: Never Smoker  . Smokeless tobacco: Never Used     Comment: Married lives with spouse + 2  daughters  . Alcohol use No     Allergies   Cefaclor; Codeine; Hydrocodone; Hydrocodone-acetaminophen; Levofloxacin; Nexium [esomeprazole magnesium]; Penicillins; and Sulfonamide derivatives   Review of Systems Review of Systems  Constitutional: Negative for fever.  Respiratory: Negative for shortness of breath.   Cardiovascular: Positive for palpitations. Negative for chest pain and leg swelling.  Gastrointestinal: Negative for abdominal pain, nausea and vomiting.  All other systems reviewed and are negative.    Physical Exam Updated Vital Signs BP 139/78 (BP Location: Right Arm)   Pulse 65   Temp 97.5 F (36.4 C) (Oral)   Resp 15   Ht 5\' 6"  (1.676 m)   Wt 265 lb (120.2 kg)   SpO2 98%   BMI 42.77 kg/m   Physical Exam  Constitutional: She is oriented to person, place, and time. She appears well-developed and well-nourished. No distress.  HENT:  Head: Normocephalic and atraumatic.  Cardiovascular: Normal heart sounds.   Tachycardia, irregularly irregular rhythm  Pulmonary/Chest: Effort normal and breath sounds normal. No respiratory distress. She has no wheezes.  Abdominal: Soft. She exhibits no distension.  Neurological: She is alert and oriented to person, place, and time.  Skin: Skin is warm and dry.  Psychiatric: She has a normal mood and affect.  Nursing note and vitals reviewed.    ED Treatments / Results  Labs (all labs ordered are listed, but only abnormal results are displayed) Labs Reviewed  BASIC METABOLIC PANEL - Abnormal; Notable for the following:       Result Value   Glucose, Bld 128 (*)    All other components within normal limits  CBC - Abnormal; Notable for the following:    RBC 5.16 (*)    All other components within normal limits  TROPONIN I    EKG  EKG Interpretation  Date/Time:  Thursday February 21 2016 05:28:46 EDT Ventricular Rate:  156 PR Interval:    QRS Duration: 83 QT Interval:  306 QTC Calculation: 493 R Axis:   5 Text  Interpretation:  Atrial fibrillation with rapid V-rate Low voltage, precordial leads ST depression, probably rate related New onset atrial fibrillation Confirmed by Viviene Thurston  MD, Elmin Wiederholt ([id number]) on 02/21/2016 6:31:29 AM      #2  EKG Interpretation  Date/Time:  Thursday February 21 2016 06:23:27 EDT Ventricular Rate:  80 PR Interval:    QRS Duration: 91 QT Interval:  385 QTC Calculation: 445 R Axis:   18 Text Interpretation:  Sinus rhythm Low voltage, precordial leads Confirmed by Azjah Pardo  MD, Berklie Dethlefs ([id number]) on 02/21/2016 6:33:43 AM        Radiology Dg Chest 2 View  Result Date: 02/21/2016 CLINICAL DATA:  Awoke  with chest palpitations. EXAM: CHEST  2 VIEW COMPARISON:  None. FINDINGS: The cardiomediastinal contours are normal. The lungs are clear. Pulmonary vasculature is normal. No consolidation, pleural effusion, or pneumothorax. No acute osseous abnormalities are seen. IMPRESSION: No active cardiopulmonary disease. Electronically Signed   By: Jeb Levering M.D.   On: 02/21/2016 06:08    Procedures Procedures (including critical care time)  Medications Ordered in ED Medications  diltiazem (CARDIZEM) 1 mg/mL load via infusion 15 mg (15 mg Intravenous Bolus from Bag 02/21/16 0556)     Initial Impression / Assessment and Plan / ED Course  I have reviewed the triage vital signs and the nursing notes.  Pertinent labs & imaging results that were available during my care of the patient were reviewed by me and considered in my medical decision making (see chart for details).  Clinical Course    Patient presents with palpitations. Noted to be in atrial fibrillation with RVR. No history of the same. Recent cardiac evaluation in February 2017 with a normal stress test. Seen by Dr. [name].  Patient noted palpitations at 4 AM. Lab work obtained. Diltiazem ordered. Per nursing, patient spontaneously converted to normal sinus rhythm prior to dental bolus. She was administered the  adult bolus but the deltoid was held. Repeat EKG shows normal sinus rhythm. Chads VASC score is 2 (female, HTN).  Will consult cardiology regarding anticoagulation. On recheck, patient symptoms have improved with conversion to sinus rhythm.  7:40 AM Discussed with Dr. [name] who recommends Xarelto. Patient baseline heart rate 65. Will not initiate a rate control her at this time. Discussed this with the patient who states understanding. Follow-up with cardiology as soon as possible.  After history, exam, and medical workup I feel the patient has been appropriately medically screened and is safe for discharge home. Pertinent diagnoses were discussed with the patient. Patient was given return precautions.   Final Clinical Impressions(s) / ED Diagnoses   Final diagnoses:  Atrial fibrillation with RVR (HCC)    New Prescriptions New Prescriptions   RIVAROXABAN (XARELTO) 20 MG TABS TABLET    Take 1 tablet (20 mg total) by mouth daily with supper.     Merryl Hacker, MD 02/21/16 [phone]

## 2016-02-21 NOTE — Telephone Encounter (Signed)
Pt seen in ER for Tachycardia and Palpitations, told to see Hochrein asap , he is booked until 04-10-16, pls advise [phone]

## 2016-02-21 NOTE — ED Notes (Addendum)
Discharge instructions, follow up care, and rx x1 reviewed with patient. Patient verbalized understanding. RN offered to have pharmacist come talk to patient about Xarelto. Patient declined and stated she will get the information from the pharmacist at the drug store.

## 2016-02-21 NOTE — Telephone Encounter (Signed)
SPOKE TO PATIENT. APPOINTMENT SCHEDULE FOR 02/17/16 WITH EXTENDER AT 8 AM. PATIENT STATES SHE HAS NOT HAD PRESCRIPTION OF XARELTO FILLED YET . RN INFORMED PATIENT TO HAVE RX FILLED TODAY AND START MEDICATION. INSTRUCTION GIVEN TO TAKE AT  SUPPER WITH A HEAVIEST MEAL. PATIENT VERBALIZED UNDERSTANDING.

## 2016-02-21 NOTE — ED Notes (Signed)
Patient transported to X-ray 

## 2016-02-21 NOTE — ED Triage Notes (Signed)
Pt comes from home with complaints of palpitations.  Stated she woke up around 4 am and had slight chest tightness.  Tachy at 150 in triage.

## 2016-02-21 NOTE — Discharge Instructions (Signed)
You were seen today and noted to be in atrial fibrillation. You spontaneously converted back to sinus rhythm. You'll be placed on a blood thinner and need to follow-up closely with cardiology.

## 2016-02-25 ENCOUNTER — Encounter: Payer: Self-pay | Admitting: Physician Assistant

## 2016-02-25 ENCOUNTER — Ambulatory Visit (INDEPENDENT_AMBULATORY_CARE_PROVIDER_SITE_OTHER): Payer: BC Managed Care – PPO | Admitting: Physician Assistant

## 2016-02-25 ENCOUNTER — Other Ambulatory Visit: Payer: Self-pay | Admitting: Obstetrics and Gynecology

## 2016-02-25 VITALS — BP 141/82 | HR 59 | Ht 66.0 in | Wt 264.2 lb

## 2016-02-25 DIAGNOSIS — I10 Essential (primary) hypertension: Secondary | ICD-10-CM | POA: Diagnosis not present

## 2016-02-25 DIAGNOSIS — R001 Bradycardia, unspecified: Secondary | ICD-10-CM

## 2016-02-25 DIAGNOSIS — I480 Paroxysmal atrial fibrillation: Secondary | ICD-10-CM

## 2016-02-25 DIAGNOSIS — G4733 Obstructive sleep apnea (adult) (pediatric): Secondary | ICD-10-CM

## 2016-02-25 DIAGNOSIS — Z9989 Dependence on other enabling machines and devices: Secondary | ICD-10-CM

## 2016-02-25 DIAGNOSIS — Z1231 Encounter for screening mammogram for malignant neoplasm of breast: Secondary | ICD-10-CM

## 2016-02-25 DIAGNOSIS — E785 Hyperlipidemia, unspecified: Secondary | ICD-10-CM | POA: Diagnosis not present

## 2016-02-25 LAB — CBC
HCT: 43.5 % (ref 35.0–45.0)
HEMOGLOBIN: 14.5 g/dL (ref 11.7–15.5)
MCH: 28.5 pg (ref 27.0–33.0)
MCHC: 33.3 g/dL (ref 32.0–36.0)
MCV: 85.6 fL (ref 80.0–100.0)
MPV: 9.1 fL (ref 7.5–12.5)
PLATELETS: 275 10*3/uL (ref 140–400)
RBC: 5.08 MIL/uL (ref 3.80–5.10)
RDW: 13.1 % (ref 11.0–15.0)
WBC: 5.8 10*3/uL (ref 3.8–10.8)

## 2016-02-25 LAB — TSH: TSH: 1.28 m[IU]/L

## 2016-02-25 MED ORDER — RIVAROXABAN 20 MG PO TABS: 20.0000 mg | ORAL_TABLET | Freq: Every day | ORAL | 3 refills | Status: DC

## 2016-02-25 MED ORDER — AMLODIPINE BESYLATE 5 MG PO TABS: 5.0000 mg | ORAL_TABLET | Freq: Every day | ORAL | 3 refills | Status: DC

## 2016-02-25 MED ORDER — PROPRANOLOL HCL 20 MG PO TABS: 20.0000 mg | ORAL_TABLET | Freq: Three times a day (TID) | ORAL | 3 refills | Status: DC | PRN

## 2016-02-25 NOTE — Addendum Note (Signed)
Addended by: Waylan Rocher on: 02/25/2016 09:35 AM   Modules accepted: Orders

## 2016-02-25 NOTE — Progress Notes (Signed)
Cardiology Office Note    Date:  02/25/2016   ID:  Shannon Navarro, DOB 1955-11-09, MRN [id number]  PCP:  [name], MD  Cardiologist:  Dr. [name]   Chief Complaint  Patient presents with  . Follow-up    seen for Dr. [name], recent ED visit with new afib    History of Present Illness:  Shannon Navarro is a 60 y.o. female with PMH of HLD, HTN, OSA on CPAP, bradycardia and GERD. She has no prior cardiac history, despite bradycardia, she has no history of presyncope or syncope. Her last office visit was on 05/24/2015, she did complain of some shortness of breath walking up inclines from her home to the mailbox. Outpatient exercise tolerance test was ordered and completed on 06/13/2015, there was no evidence of ischemia, she was able to achieve 85% maximal heart rate after 6 minutes, no ST segment deviation was noted. Patient recently presented to the emergency room on 02/21/2016 with palpitation that woke her up from sleep and found to have new onset of atrial fibrillation. Her last TSH was in February 2016 which was normal. Patient spontaneously converted on diltiazem bolus. Given her elevated CHA2DS2-Vasc score 2 (female and HTN), she was placed on Xarelto.   She has been doing well since her discharge. She denies any recent exertional chest pain. She occasionally has acid reflux sensation that's relieved by burping and does not seems to be related to exertion. She has not had syncope since her teenage years, and does not seems to have any obvious symptom associated with bradycardia. Her blood pressure however has been persistently remaining in the 140s to 150s at home despite on losartan. Otherwise she is trying to increase activity for weight loss purposes. Her baseline bradycardia is of concern in this case, I have considered potentially to add low-dose carvedilol, however eventually opted seated with PRN propranolol 20 mg TID unless she has recurrence of atrial fibrillation.  She has not had any major bleeding episodes. For some reason, although hyperlipidemia is listed as part of medical history, she is not on any statin medication. Looking back, her last fasting lipid panel obtained in February 2017 showed cholesterol was 72, triglyceride 17, HDL 43, LDL 108. Since patient is trying to increase her activity level, we'll hold off on any medication at this time. Recommend recheck fasting lipid panel in February, if still elevated, a low-dose statin medication. Otherwise I have added amlodipine 5 mg daily for blood pressure control. We will also obtain TSH, CBC and echocardiogram.   Past Medical History:  Diagnosis Date  . ALLERGIC RHINITIS   . ANXIETY   . Fatty liver 06/23/07  . GERD 07/2004   hx food impaction  . Hiatal hernia   . HYPERGLYCEMIA   . HYPERTENSION   . Menopause    @ 50  . Migraine   . Obesity, unspecified   . OSA (obstructive sleep apnea) 01/16/2012   started CPAP 02/2012  . Pseudotumor cerebri 2007  . Shingles   . Vitamin D deficiency 07/2013    Past Surgical History:  Procedure Laterality Date  . BTL  1995  . COLONOSCOPY    . DILATION AND CURETTAGE OF UTERUS  2010  . UPPER GASTROINTESTINAL ENDOSCOPY      Current Medications: Outpatient Medications Prior to Visit  Medication Sig Dispense Refill  . Cholecalciferol (VITAMIN D3) 5000 UNITS TABS Take 5,000 Units by mouth daily. 30 tablet   . fluticasone (FLONASE) 50 MCG/ACT nasal spray Place  1 spray into both nostrils as needed. 0.05 g 3  . loratadine (CLARITIN) 10 MG tablet Take 1 tablet (10 mg total) by mouth daily as needed for allergies. 30 tablet 2  . losartan (COZAAR) 100 MG tablet TAKE 1 TABLET DAILY 90 tablet 3  . ranitidine (ZANTAC) 300 MG tablet Take 1 tablet (300 mg total) by mouth daily. 90 tablet 3  . venlafaxine XR (EFFEXOR-XR) 75 MG 24 hr capsule TAKE 1 CAPSULE DAILY 90 capsule 3  . rivaroxaban (XARELTO) 20 MG TABS tablet Take 1 tablet (20 mg total) by mouth daily with  supper. 30 tablet 0  . clarithromycin (BIAXIN) 500 MG tablet Take 1 tablet (500 mg total) by mouth 2 (two) times daily. (Patient not taking: Reported on 02/25/2016) 20 tablet 0  . fluconazole (DIFLUCAN) 150 MG tablet Take 1 tablet (150 mg total) by mouth once. May repeat in 48 hours if needed. (Patient not taking: Reported on 02/25/2016) 2 tablet 0  . predniSONE (DELTASONE) 20 MG tablet Take 2 tablets (40 mg total) by mouth daily with breakfast. (Patient not taking: Reported on 10/16/2015) 10 tablet 0   No facility-administered medications prior to visit.      Allergies:   Cefaclor; Codeine; Hydrocodone; Hydrocodone-acetaminophen; Levofloxacin; Nexium [esomeprazole magnesium]; Penicillins; and Sulfonamide derivatives   Social History   Social History  . Marital status: Married    Spouse name: N/A  . Number of children: 2  . Years of education: N/A   Occupational History  . Retired from [employer]    from [employer], currently works [employer] in [place]  . Smoking status: Never Smoker  . Smokeless tobacco: Never Used     Comment: Married lives with spouse + 2 daughters  . Alcohol use No  . Drug use: No  . Sexual activity: Not Asked   Other Topics Concern  . None   Social History Narrative   Occupation: Mudlogger, social services. Married, lives with husband. Has 2 daughter 27 - 62     Family History:  The patient's family history includes Aortic stenosis in her father; Asthma in her mother; Breast cancer in her mother; Colon polyps in her father; Diabetes in her father; Heart disease in her father; Hypertension in her father and mother; Prostate cancer in her father.   ROS:   Please see the history of present illness.    ROS All other systems reviewed and are negative.   PHYSICAL EXAM:   VS:  BP (!) 141/82 (BP Location: Left Arm, Patient Position: Sitting, Cuff Size: Large)   Pulse (!) 59   Ht 5\' 6"  (1.676 m)   Wt 264 lb  3.2 oz (119.8 kg)   SpO2 98%   BMI 42.64 kg/m    GEN: Well nourished, well developed, in no acute distress  HEENT: normal  Neck: no JVD, carotid bruits, or masses Cardiac: RRR; no murmurs, rubs, or gallops,no edema  Respiratory:  clear to auscultation bilaterally, normal work of breathing GI: soft, nontender, nondistended, + BS MS: no deformity or atrophy  Skin: warm and dry, no rash Neuro:  Alert and Oriented x 3, Strength and sensation are intact Psych: euthymic mood, full affect  Wt Readings from Last 3 Encounters:  02/25/16 264 lb 3.2 oz (119.8 kg)  02/21/16 265 lb (120.2 kg)  10/16/15 269 lb (122 kg)      Studies/Labs Reviewed:   EKG:  EKG is ordered today.  The ekg ordered today demonstrates NSR without significant ST-T wave changes  Recent Labs: 06/20/2015: ALT 18 02/21/2016: BUN 15; Creatinine, Ser 0.79; Hemoglobin 14.7; Platelets 294; Potassium 3.6; Sodium 142   Lipid Panel    Component Value Date/Time   CHOL 172 06/20/2015 0907   TRIG 103.0 06/20/2015 0907   HDL 43.10 06/20/2015 0907   CHOLHDL 4 06/20/2015 0907   VLDL 20.6 06/20/2015 0907   LDLCALC 108 (H) 06/20/2015 0907    Additional studies/ records that were reviewed today include:   ETT 06/13/2015 85% of maximum heart rate was achieved after 6 minutes. Recovery time: 7 minutes.  [facility] Treadmill Score: intermediate risk There was no ST segment deviation noted during stress.     ASSESSMENT:    1. PAF (paroxysmal atrial fibrillation) ([place])   2. Bradycardia   3. Essential hypertension   4. Hyperlipidemia, unspecified hyperlipidemia type   5. OSA on CPAP      PLAN:  In order of problems listed above:  1. PAF  - started on Xarelto in the ED on 02/21/2016, obtain CBC. Obtain TSH since last TSH was in 06/2014  - This patients CHA2DS2-VASc Score and unadjusted Ischemic Stroke Rate (% per year) is equal to 2.2 % stroke rate/year from a score of 2  Above score calculated as 1 point each if present  [CHF, HTN, DM, Vascular=MI/PAD/Aortic Plaque, Age if 65-74, or Female] Above score calculated as 2 points each if present [Age > 75, or Stroke/TIA/TE]  - Her baseline bradycardia unfortunately prevented [place] from adding strong rate control medication, alternatives include low-dose carvedilol versus PRN medication. I have he eventually decided to add propranolol 20 mg TID PRN for recurrence of atrial fibrillation and for heart rate greater than 120. She has been instructed to call cardiology if she does have recurrence of atrial fibrillation. Overall, I think she would be a good candidate for flecainide if she has any recurrence.   - obtain outpatient echocardiogram  2. Bradycardia: She has chronic asymptomatic bradycardia, however with heart rate in the 50s, and a sometimes as low as 47, it is difficult to add recut control medication. Although she may be able to tolerate lowest dose of carvedilol, I am more inclined to put her on PRN medication for now  3. HTN: Blood pressure has been uncontrolled in the 140s to 150s at home, add 5 mg daily amlodipine  4. HLD: Last lipid panel in February 2017 showed mildly elevated LDL, encourage diet and exercise and weight loss, recheck in February 2018.  5. OSA on CPAP: Patient states she has been compliant with her CPAP machine every night    Medication Adjustments/Labs and Tests Ordered: Current medicines are reviewed at length with the patient today.  Concerns regarding medicines are outlined above.  Medication changes, Labs and Tests ordered today are listed in the Patient Instructions below. Patient Instructions  Medication Instructions:  START AMLODIPINE 5MG  DAILY  START PROPRANOLOL 20MG  THREE TIMES DAILY AS NEEDED FOR HEARTRATE GREATER THAN 120  Labwork: CBC AND TSH AT [facility] ON THE FIRST FLOOR  Testing/Procedures: Your physician has requested that you have an echocardiogram. Echocardiography is a painless test that uses sound waves to  create images of your heart. It provides your doctor with information about the size and shape of your heart and how well your heart's chambers and valves are working. This procedure takes approximately one hour. There are no restrictions for this procedure.  Follow-Up: IN 2 MONTHS WITH DR [facility]  If you need a refill on your cardiac medications before your next appointment, please call your pharmacy.     Hilbert Corrigan, [place]  02/25/2016 9:15 AM    [address], [place], [place]  [address] Phone: [phone]; Fax: [phone]

## 2016-02-25 NOTE — Patient Instructions (Signed)
Medication Instructions:  START AMLODIPINE 5MG  DAILY  START PROPRANOLOL 20MG  THREE TIMES DAILY AS NEEDED FOR HEARTRATE GREATER THAN 120  Labwork: CBC AND TSH AT [facility] ON THE FIRST FLOOR  Testing/Procedures: Your physician has requested that you have an echocardiogram. Echocardiography is a painless test that uses sound waves to create images of your heart. It provides your doctor with information about the size and shape of your heart and how well your heart's chambers and valves are working. This procedure takes approximately one hour. There are no restrictions for this procedure.  Follow-Up: IN 2 MONTHS WITH DR [facility]  If you need a refill on your cardiac medications before your next appointment, please call your pharmacy.

## 2016-02-28 ENCOUNTER — Telehealth: Payer: Self-pay | Admitting: Physician Assistant

## 2016-02-28 NOTE — Telephone Encounter (Signed)
-----   Message from [place], [place] sent at 02/27/2016  2:43 PM EDT ----- Thyroid normal, red blood cell count and white blood cell count normal. Continue Xarelto

## 2016-02-28 NOTE — Telephone Encounter (Signed)
Results given to pt. Pt verbalized understanding and agreed to continue Xarelto.

## 2016-03-11 ENCOUNTER — Other Ambulatory Visit: Payer: Self-pay

## 2016-03-11 ENCOUNTER — Ambulatory Visit (HOSPITAL_COMMUNITY): Payer: BC Managed Care – PPO | Attending: Cardiology

## 2016-03-11 ENCOUNTER — Ambulatory Visit (INDEPENDENT_AMBULATORY_CARE_PROVIDER_SITE_OTHER): Payer: BC Managed Care – PPO

## 2016-03-11 DIAGNOSIS — I480 Paroxysmal atrial fibrillation: Secondary | ICD-10-CM | POA: Insufficient documentation

## 2016-03-11 DIAGNOSIS — Z23 Encounter for immunization: Secondary | ICD-10-CM | POA: Diagnosis not present

## 2016-03-11 HISTORY — PX: TRANSTHORACIC ECHOCARDIOGRAM: SHX275

## 2016-03-13 ENCOUNTER — Other Ambulatory Visit: Payer: Self-pay | Admitting: Pharmacist Clinician (PhC)/ Clinical Pharmacy Specialist

## 2016-03-13 MED ORDER — RIVAROXABAN 20 MG PO TABS: 20.0000 mg | ORAL_TABLET | Freq: Every day | ORAL | 1 refills | Status: DC

## 2016-03-17 ENCOUNTER — Ambulatory Visit
Admission: RE | Admit: 2016-03-17 | Discharge: 2016-03-17 | Disposition: A | Discharge status: Home / Self Care | Payer: BC Managed Care – PPO | Source: Ambulatory Visit | Attending: Obstetrics and Gynecology | Admitting: Obstetrics and Gynecology

## 2016-03-17 DIAGNOSIS — Z1231 Encounter for screening mammogram for malignant neoplasm of breast: Secondary | ICD-10-CM

## 2016-04-09 ENCOUNTER — Encounter: Payer: Self-pay | Admitting: Pulmonary Disease

## 2016-04-09 ENCOUNTER — Ambulatory Visit (INDEPENDENT_AMBULATORY_CARE_PROVIDER_SITE_OTHER): Payer: BC Managed Care – PPO | Admitting: Pulmonary Disease

## 2016-04-09 ENCOUNTER — Ambulatory Visit: Payer: BC Managed Care – PPO | Admitting: Nurse Practitioner

## 2016-04-09 VITALS — BP 146/84 | HR 63 | Ht 66.0 in | Wt 266.0 lb

## 2016-04-09 DIAGNOSIS — Z9989 Dependence on other enabling machines and devices: Secondary | ICD-10-CM

## 2016-04-09 DIAGNOSIS — Z6841 Body Mass Index (BMI) 40.0 and over, adult: Secondary | ICD-10-CM

## 2016-04-09 DIAGNOSIS — J309 Allergic rhinitis, unspecified: Secondary | ICD-10-CM

## 2016-04-09 DIAGNOSIS — G4733 Obstructive sleep apnea (adult) (pediatric): Secondary | ICD-10-CM | POA: Diagnosis not present

## 2016-04-09 NOTE — Progress Notes (Signed)
Current Outpatient Prescriptions on File Prior to Visit  Medication Sig  . amLODipine (NORVASC) 5 MG tablet Take 1 tablet (5 mg total) by mouth daily.  . Cholecalciferol (VITAMIN D3) 5000 UNITS TABS Take 5,000 Units by mouth daily. (Patient taking differently: Take 5,000 Units by mouth as needed. )  . fluticasone (FLONASE) 50 MCG/ACT nasal spray Place 1 spray into both nostrils as needed.  . loratadine (CLARITIN) 10 MG tablet Take 1 tablet (10 mg total) by mouth daily as needed for allergies.  Marland Kitchen losartan (COZAAR) 100 MG tablet TAKE 1 TABLET DAILY  . ranitidine (ZANTAC) 300 MG tablet Take 1 tablet (300 mg total) by mouth daily. (Patient taking differently: Take 300 mg by mouth daily as needed. )  . rivaroxaban (XARELTO) 20 MG TABS tablet Take 1 tablet (20 mg total) by mouth daily with supper.  . venlafaxine XR (EFFEXOR-XR) 75 MG 24 hr capsule TAKE 1 CAPSULE DAILY  . propranolol (INDERAL) 20 MG tablet Take 1 tablet (20 mg total) by mouth 3 (three) times daily as needed. For Heartrate greater 120 only (Patient not taking: Reported on 04/09/2016)   No current facility-administered medications on file prior to visit.      Chief Complaint  Patient presents with  . Follow-up    Wears CPAP nightly. Pt states that she currently has a sinus infection with some ear pain. Denies any issues with mask/pressure. DME: [facility]     Sleep tests PSG 02/09/12 >> AHI 38.8, SpO2 low 82%.  CPAP 10 cm H2O CPAP 02/09/16 to 04/08/16 >> used on 60 of 60 nights with average 8 hrs 25 min.  Average AHI 1.4 with CPAP 10 cm H2O  Cardiac tests Echo 03/11/16 >> mild LVH, EF 60 to 65%, mild MR, PAS 32 mmHg  Past medical history Anxiety, Fatty liver, GERD, HH, PAF, HTN, HLD, Migraine HA, Pseudotumor cerebri, Vitamin D deficiency, Shingles  Past surgical history, Family history, Social history, Allergies all reviewed.  Vital Signs BP (!) 146/84 (BP Location: Left Arm, Cuff Size: Normal)   Pulse 63   Ht 5\' 6"  (1.676 m)    Wt 266 lb (120.7 kg)   SpO2 97%   BMI 42.93 kg/m   History of Present Illness Shannon Navarro is a 60 y.o. female with obstructive sleep apnea.  I last saw her in 2015.  She was seen in ER on 02/21/16 after waking up from sleep with palpitations.  She was found to have atrial fibrillation.    She uses CPAP nightly.  She denies problem with her mask.  She developed a sinus infection around Thanksgiving.  She went to urgent care and given prednisone and erythromycin.  She still has congestion, and itching in her right ear.  Physical Exam  General - No distress ENT - No sinus tenderness, clear nasal drainage, no oral exudate, no LAN, TM clear b/l Cardiac - s1s2 regular, no murmur Chest - No wheeze/rales/dullness Back - No focal tenderness Abd - Soft, non-tender Ext - No edema Neuro - Normal strength Skin - No rashes Psych - normal mood, and behavior   Assessment/Plan  Obstructive sleep apnea. - she is compliant with therapy and reports benefit from CPAP - continue CPAP 10 cm H2O - will arrange for ONO with CPAP  Obesity. - discussed importance of weight loss  Allergic rhinitis. - advised her to resume nasal irrigation and flonase - continue zyrtec - don't think she needs additional abx at this time - advised her to avoid using  cotton swabs in her ear   Patient Instructions  Will arrange for overnight oxygen test with you using CPAP  Try using saline nasal spray and flonase daily for next one week, then as needed  Follow up in 1 year    Chesley Mires, MD [facility] Pager:  [phone] 04/09/2016, 9:34 AM

## 2016-04-09 NOTE — Patient Instructions (Signed)
Will arrange for overnight oxygen test with you using CPAP  Try using saline nasal spray and flonase daily for next one week, then as needed  Follow up in 1 year

## 2016-04-16 ENCOUNTER — Encounter: Payer: Self-pay | Admitting: Cardiology

## 2016-04-20 NOTE — Progress Notes (Signed)
Cardiology Office Note   Date:  04/23/2016   ID:  Shannon Navarro, DOB 1955-07-13, MRN [id number]  PCP/referring MD:  Shannon Koch, MD  Cardiologist:   Shannon Breeding, MD    Chief Complaint  Patient presents with  . Bradycardia      History of Present Illness: Shannon Navarro is a 60 y.o. female who presents for evaluation of bradycardia.  I saw her in January.  She had an outpatient exercise tolerance test was ordered and completed on 06/13/2015, there was no evidence of ischemia, she was able to achieve 85% maximal heart rate after 6 minutes, no ST segment deviation was noted. The patient presented to the emergency room on 02/21/2016 with palpitation that woke her up from sleep and found to have new onset of atrial fibrillation. Patient spontaneously converted on diltiazem bolus. Given her elevated CHA2DS2-Vasc score 2 (female and HTN), she was placed on Xarelto.   Since that time she has had no further symptoms.  She has had no palpitations.  She is tolerating the blood thinner.   Past Medical History:  Diagnosis Date  . ALLERGIC RHINITIS   . ANXIETY   . Fatty liver 06/23/07  . GERD 07/2004   hx food impaction  . Hiatal hernia   . HYPERGLYCEMIA   . HYPERTENSION   . Menopause    @ 73  . Migraine   . Obesity, unspecified   . OSA (obstructive sleep apnea) 01/16/2012   started CPAP 02/2012  . Paroxysmal atrial fibrillation ([place]) 02/2016  . Pseudotumor cerebri 2007  . Shingles   . Vitamin D deficiency 07/2013    Past Surgical History:  Procedure Laterality Date  . BTL  1995  . COLONOSCOPY    . DILATION AND CURETTAGE OF UTERUS  2010  . UPPER GASTROINTESTINAL ENDOSCOPY       Current Outpatient Prescriptions  Medication Sig Dispense Refill  . amLODipine (NORVASC) 5 MG tablet Take 1 tablet (5 mg total) by mouth daily. 30 tablet 3  . Cholecalciferol (VITAMIN D3) 5000 UNITS TABS Take 5,000 Units by mouth daily. (Patient taking differently: Take 5,000 Units by  mouth as needed. ) 30 tablet   . fluticasone (FLONASE) 50 MCG/ACT nasal spray Place 1 spray into both nostrils as needed. 0.05 g 3  . loratadine (CLARITIN) 10 MG tablet Take 1 tablet (10 mg total) by mouth daily as needed for allergies. 30 tablet 2  . losartan (COZAAR) 100 MG tablet TAKE 1 TABLET DAILY 90 tablet 3  . propranolol (INDERAL) 20 MG tablet Take 1 tablet (20 mg total) by mouth 3 (three) times daily as needed. For Heartrate greater 120 only 90 tablet 3  . ranitidine (ZANTAC) 300 MG tablet Take 1 tablet (300 mg total) by mouth daily. (Patient taking differently: Take 300 mg by mouth as needed. ) 90 tablet 3  . rivaroxaban (XARELTO) 20 MG TABS tablet Take 1 tablet (20 mg total) by mouth daily with supper. 90 tablet 1  . venlafaxine XR (EFFEXOR-XR) 75 MG 24 hr capsule TAKE 1 CAPSULE DAILY 90 capsule 3   No current facility-administered medications for this visit.     Allergies:   Cefaclor; Codeine; Hydrocodone; Hydrocodone-acetaminophen; Levofloxacin; Nexium [esomeprazole magnesium]; Penicillins; and Sulfonamide derivatives    ROS:  Please see the history of present illness.   Otherwise, review of systems are positive for none.   All other systems are reviewed and negative.    PHYSICAL EXAM: VS:  BP 130/88 (BP Location:  Right Arm, Patient Position: Sitting, Cuff Size: Normal)   Pulse 65   Ht 5\' 6"  (1.676 m)   Wt 264 lb (119.7 kg)   SpO2 97%   BMI 42.61 kg/m  , BMI Body mass index is 42.61 kg/m. GENERAL:  Well appearing NECK:  No jugular venous distention, waveform within normal limits, carotid upstroke brisk and symmetric, no bruits, no thyromegaly LUNGS:  Clear to auscultation bilaterally BACK:  No CVA tenderness CHEST:  Unremarkable HEART:  PMI not displaced or sustained,S1 and S2 within normal limits, no S3, no S4, no clicks, no rubs, no murmurs ABD:  Flat, positive bowel sounds normal in frequency in pitch, no bruits, no rebound, no guarding, no midline pulsatile mass, no  hepatomegaly, no splenomegaly EXT:  2 plus pulses throughout, no edema, no cyanosis no clubbing   EKG:  EKG is  not ordered today.   Recent Labs: 06/20/2015: ALT 18 02/21/2016: BUN 15; Creatinine, Ser 0.79; Potassium 3.6; Sodium 142 02/25/2016: Hemoglobin 14.5; Platelets 275; TSH 1.28    Lipid Panel    Component Value Date/Time   CHOL 172 06/20/2015 0907   TRIG 103.0 06/20/2015 0907   HDL 43.10 06/20/2015 0907   CHOLHDL 4 06/20/2015 0907   VLDL 20.6 06/20/2015 0907   LDLCALC 108 (H) 06/20/2015 0907      Wt Readings from Last 3 Encounters:  04/21/16 264 lb (119.7 kg)  04/09/16 266 lb (120.7 kg)  02/25/16 264 lb 3.2 oz (119.8 kg)     Other studies Reviewed: Additional studies/ records that were reviewed today include: ER records. Review of the above records demonstrates:     ASSESSMENT AND PLAN:  PAF:  [name] has a CHA2DS2 - VASc score of 2.  She tolerates the anticoagulation.  No change in therapy is planned.  MR:  This was mild and I will follow this clinically.  BRADYCARDIA:  Because of this we are not using daily beta blocker or calcium blocker. She will take Cardizem PRN  OBSTRUCTIVE SLEEP APNEA:   This is being actively managed.   Current medicines are reviewed at length with the patient today.  The patient does not have concerns regarding medicines.  The following changes have been made:  None  Labs/ tests ordered today include:  None  No orders of the defined types were placed in this encounter.    Disposition:   FU with me in six months.    Signed, Shannon Breeding, MD  04/23/2016 8:12 AM    [place]

## 2016-04-21 ENCOUNTER — Encounter: Payer: Self-pay | Admitting: Cardiology

## 2016-04-21 ENCOUNTER — Ambulatory Visit (INDEPENDENT_AMBULATORY_CARE_PROVIDER_SITE_OTHER): Payer: BC Managed Care – PPO | Admitting: Cardiology

## 2016-04-21 VITALS — BP 130/88 | HR 65 | Ht 66.0 in | Wt 264.0 lb

## 2016-04-21 DIAGNOSIS — I480 Paroxysmal atrial fibrillation: Secondary | ICD-10-CM | POA: Diagnosis not present

## 2016-04-21 NOTE — Patient Instructions (Signed)

## 2016-04-23 ENCOUNTER — Encounter: Payer: Self-pay | Admitting: Cardiology

## 2016-04-28 ENCOUNTER — Ambulatory Visit (INDEPENDENT_AMBULATORY_CARE_PROVIDER_SITE_OTHER): Payer: BC Managed Care – PPO | Admitting: Internal Medicine

## 2016-04-28 ENCOUNTER — Encounter: Payer: Self-pay | Admitting: Internal Medicine

## 2016-04-28 DIAGNOSIS — J0100 Acute maxillary sinusitis, unspecified: Secondary | ICD-10-CM | POA: Diagnosis not present

## 2016-04-28 MED ORDER — DOXYCYCLINE HYCLATE 100 MG PO TABS: 100.0000 mg | ORAL_TABLET | Freq: Two times a day (BID) | ORAL | 0 refills | Status: DC

## 2016-04-28 NOTE — Progress Notes (Signed)
   Subjective:    Patient ID: Shannon Navarro, female    DOB: 06/28/1955, 60 y.o.   MRN: [id number]  HPI The patient is a 60 YO female coming in for ear problems. Her right ear. Going on for 3-4 weeks. She went to urgent care and was given azithromycin for 1 week and she feels like she is not getting better. Some blood in her nose with blowing. She is having also major sinus pain and problems. Also going on for the 3-4 weeks. Overall feel they are worsening. She is taking zyrtec, flonase, saline nose spray which are helping some but not enough. Some chills but no fevers. Pain and headaches. Some blood when she blows and mostly yellow. Mild cough but no SOB or sore throat.   Review of Systems  Constitutional: Positive for activity change and chills. Negative for appetite change, fatigue, fever and unexpected weight change.  HENT: Positive for congestion, ear pain, rhinorrhea, sinus pain and sinus pressure. Negative for ear discharge, nosebleeds, postnasal drip, sore throat and trouble swallowing.   Eyes: Negative.   Respiratory: Positive for cough. Negative for chest tightness, shortness of breath and wheezing.   Cardiovascular: Negative.   Gastrointestinal: Negative.   Musculoskeletal: Negative.       Objective:   Physical Exam  Constitutional: She is oriented to person, place, and time. She appears well-developed and well-nourished.  HENT:  Head: Normocephalic and atraumatic.  TMs normal bilaterally, sinus pain frontal, oropharynx with redness and yellow nasal crusting.  Eyes: EOM are normal.  Neck: Normal range of motion. No JVD present.  Cardiovascular: Normal rate and regular rhythm.   Pulmonary/Chest: Effort normal. No respiratory distress. She has no wheezes. She has no rales. She exhibits no tenderness.  Abdominal: Soft. She exhibits no distension. There is no tenderness. There is no rebound.  Lymphadenopathy:    She has cervical adenopathy.  Neurological: She is alert and  oriented to person, place, and time.  Skin: Skin is warm and dry.   Vitals:   04/28/16 0840  BP: (!) 148/82  Pulse: (!) 57  Resp: 18  Temp: 98 F (36.7 C)  TempSrc: Oral  SpO2: 96%  Weight: 268 lb (121.6 kg)  Height: 5\' 6"  (1.676 m)      Assessment & Plan:

## 2016-04-28 NOTE — Patient Instructions (Addendum)
We have sent in the doxycycline for the infection. Take 1 pill twice a day for 10 days.   Call us back if you are not feeling better.

## 2016-04-28 NOTE — Assessment & Plan Note (Signed)
Rx for doxycycline 10 days. She is now 1 month into symptoms and on zyrtec and flonase and saline which she is encouraged to continue. With pcn allergy noted.

## 2016-04-28 NOTE — Progress Notes (Signed)
Pre visit review using our clinic review tool, if applicable. No additional management support is needed unless otherwise documented below in the visit note. 

## 2016-05-01 ENCOUNTER — Telehealth: Payer: Self-pay | Admitting: Pulmonary Disease

## 2016-05-01 NOTE — Telephone Encounter (Signed)
ONO with CPAP 04/14/16 >> test time 7 hrs 56 min.  Baseline SpO2 95.3%, low SpO2 89%.   Will have my nurse inform pt that ONO looked good with CPAP. She does not need supplemental oxygen at night.

## 2016-05-02 NOTE — Telephone Encounter (Signed)
Spoke with pt and notified of results per Dr. Sood. Pt verbalized understanding and denied any questions.  

## 2016-05-09 ENCOUNTER — Encounter: Payer: Self-pay | Admitting: Pulmonary Disease

## 2016-05-28 ENCOUNTER — Other Ambulatory Visit: Payer: Self-pay | Admitting: Internal Medicine

## 2016-06-18 ENCOUNTER — Other Ambulatory Visit: Payer: Self-pay | Admitting: Internal Medicine

## 2016-06-27 ENCOUNTER — Encounter: Payer: BC Managed Care – PPO | Admitting: Internal Medicine

## 2016-09-01 ENCOUNTER — Other Ambulatory Visit (INDEPENDENT_AMBULATORY_CARE_PROVIDER_SITE_OTHER): Payer: BC Managed Care – PPO

## 2016-09-01 ENCOUNTER — Ambulatory Visit (INDEPENDENT_AMBULATORY_CARE_PROVIDER_SITE_OTHER): Payer: BC Managed Care – PPO | Admitting: Internal Medicine

## 2016-09-01 ENCOUNTER — Encounter: Payer: Self-pay | Admitting: Internal Medicine

## 2016-09-01 VITALS — BP 134/74 | HR 64 | Temp 98.8°F | Resp 12 | Ht 66.0 in | Wt 268.0 lb

## 2016-09-01 DIAGNOSIS — J301 Allergic rhinitis due to pollen: Secondary | ICD-10-CM | POA: Diagnosis not present

## 2016-09-01 DIAGNOSIS — E559 Vitamin D deficiency, unspecified: Secondary | ICD-10-CM | POA: Diagnosis not present

## 2016-09-01 DIAGNOSIS — I10 Essential (primary) hypertension: Secondary | ICD-10-CM | POA: Diagnosis not present

## 2016-09-01 DIAGNOSIS — Z0000 Encounter for general adult medical examination without abnormal findings: Secondary | ICD-10-CM

## 2016-09-01 DIAGNOSIS — G4733 Obstructive sleep apnea (adult) (pediatric): Secondary | ICD-10-CM

## 2016-09-01 DIAGNOSIS — R7301 Impaired fasting glucose: Secondary | ICD-10-CM | POA: Diagnosis not present

## 2016-09-01 DIAGNOSIS — K219 Gastro-esophageal reflux disease without esophagitis: Secondary | ICD-10-CM | POA: Diagnosis not present

## 2016-09-01 DIAGNOSIS — F411 Generalized anxiety disorder: Secondary | ICD-10-CM

## 2016-09-01 LAB — CBC
HCT: 42 % (ref 36.0–46.0)
Hemoglobin: 14.1 g/dL (ref 12.0–15.0)
MCHC: 33.6 g/dL (ref 30.0–36.0)
MCV: 85 fl (ref 78.0–100.0)
PLATELETS: 260 10*3/uL (ref 150.0–400.0)
RBC: 4.94 Mil/uL (ref 3.87–5.11)
RDW: 13.5 % (ref 11.5–15.5)
WBC: 5.3 10*3/uL (ref 4.0–10.5)

## 2016-09-01 LAB — COMPREHENSIVE METABOLIC PANEL
ALK PHOS: 70 U/L (ref 39–117)
ALT: 17 U/L (ref 0–35)
AST: 13 U/L (ref 0–37)
Albumin: 3.9 g/dL (ref 3.5–5.2)
BILIRUBIN TOTAL: 0.6 mg/dL (ref 0.2–1.2)
BUN: 13 mg/dL (ref 6–23)
CALCIUM: 9.3 mg/dL (ref 8.4–10.5)
CO2: 28 meq/L (ref 19–32)
Chloride: 106 mEq/L (ref 96–112)
Creatinine, Ser: 0.77 mg/dL (ref 0.40–1.20)
GFR: 81.4 mL/min (ref >60.00)
Glucose, Bld: 114 mg/dL — ABNORMAL HIGH (ref 70–99)
Potassium: 4.1 mEq/L (ref 3.5–5.1)
Sodium: 142 mEq/L (ref 135–145)
Total Protein: 7 g/dL (ref 6.0–8.3)

## 2016-09-01 LAB — TSH: TSH: 1.39 u[IU]/mL (ref 0.35–4.50)

## 2016-09-01 LAB — VITAMIN D 25 HYDROXY (VIT D DEFICIENCY, FRACTURES): VITD: 27.88 ng/mL — ABNORMAL LOW (ref 30.00–100.00)

## 2016-09-01 LAB — LIPID PANEL
CHOL/HDL RATIO: 4
Cholesterol: 152 mg/dL (ref 0–200)
HDL: 42.1 mg/dL (ref >39.00)
LDL CALC: 96 mg/dL (ref 0–99)
NonHDL: 110.25
TRIGLYCERIDES: 71 mg/dL (ref 0.0–149.0)
VLDL: 14.2 mg/dL (ref 0.0–40.0)

## 2016-09-01 LAB — HEMOGLOBIN A1C: Hgb A1c MFr Bld: 6 % (ref 4.6–6.5)

## 2016-09-01 MED ORDER — RANITIDINE HCL 300 MG PO TABS: 300.0000 mg | ORAL_TABLET | ORAL | 3 refills | Status: DC | PRN

## 2016-09-01 NOTE — Assessment & Plan Note (Signed)
Not exercising and more stress eating lately, we talked about other stress methods and exercising some today. Complicated by OSA, HTN, GERD.

## 2016-09-01 NOTE — Assessment & Plan Note (Signed)
Needs HgA1c today for monitoring. Weight stable from last year.

## 2016-09-01 NOTE — Assessment & Plan Note (Signed)
BP at goal on her losartan and amlodipine. Refill as needed.

## 2016-09-01 NOTE — Assessment & Plan Note (Signed)
Recurrent issues with sinus infections and seeing ENT currently for some ear dysfunction.

## 2016-09-01 NOTE — Progress Notes (Signed)
   Subjective:    Patient ID: Shannon Navarro, female    DOB: 01-03-1956, 61 y.o.   MRN: [id number]  HPI The patient is a 61 YO female coming in for wellness. No new concerns.   PMH, [facility], social history reviewed and updated.   Review of Systems  Constitutional: Negative.   HENT: Negative.   Eyes: Negative.   Respiratory: Negative for cough, chest tightness and shortness of breath.   Cardiovascular: Negative for chest pain, palpitations and leg swelling.  Gastrointestinal: Negative for abdominal distention, abdominal pain, constipation, diarrhea, nausea and vomiting.  Musculoskeletal: Negative.   Skin: Negative.   Neurological: Negative.   Psychiatric/Behavioral: Negative.       Objective:   Physical Exam  Constitutional: She is oriented to person, place, and time. She appears well-developed and well-nourished.  Overweight  HENT:  Head: Normocephalic and atraumatic.  Eyes: EOM are normal.  Neck: Normal range of motion.  Cardiovascular: Normal rate and regular rhythm.   Pulmonary/Chest: Effort normal and breath sounds normal. No respiratory distress. She has no wheezes. She has no rales.  Abdominal: Soft. Bowel sounds are normal. She exhibits no distension. There is no tenderness. There is no rebound.  Musculoskeletal: She exhibits no edema.  Neurological: She is alert and oriented to person, place, and time. Coordination normal.  Skin: Skin is warm and dry.  Psychiatric: She has a normal mood and affect.   Vitals:   09/01/16 0804  BP: 134/74  Pulse: 64  Resp: 12  Temp: 98.8 F (37.1 C)  TempSrc: Oral  SpO2: 98%  Weight: 268 lb (121.6 kg)  Height: 5\' 6"  (1.676 m)      Assessment & Plan:

## 2016-09-01 NOTE — Assessment & Plan Note (Signed)
Uses CPAP nightly and has been using still.

## 2016-09-01 NOTE — Assessment & Plan Note (Signed)
Takes effexor which does help her adequately.

## 2016-09-01 NOTE — Assessment & Plan Note (Signed)
Checking vitamin D level today, not taking oral replacement at this time.

## 2016-09-01 NOTE — Assessment & Plan Note (Signed)
Colonoscopy and mammogram up to date. Flu and tetanus up to date. Counseled on new shingrix vaccine. Not exercising and counseled about that. Counseled about sun safety and dangers of distracted driving. Given screening recommendations.

## 2016-09-01 NOTE — Assessment & Plan Note (Signed)
Refill zantac to use daily prn for certain foods.

## 2016-09-01 NOTE — Patient Instructions (Signed)
Ask the insurance about the shingrix which is the shingles shot. You get a shot then about 2 months later.    Work on getting more active this spring and summer.   Health Maintenance, Female Adopting a healthy lifestyle and getting preventive care can go a long way to promote health and wellness. Talk with your health care provider about what schedule of regular examinations is right for you. This is a good chance for you to check in with your provider about disease prevention and staying healthy. In between checkups, there are plenty of things you can do on your own. Experts have done a lot of research about which lifestyle changes and preventive measures are most likely to keep you healthy. Ask your health care provider for more information. Weight and diet Eat a healthy diet  Be sure to include plenty of vegetables, fruits, low-fat dairy products, and lean protein.  Do not eat a lot of foods high in solid fats, added sugars, or salt.  Get regular exercise. This is one of the most important things you can do for your health.  Most adults should exercise for at least 150 minutes each week. The exercise should increase your heart rate and make you sweat (moderate-intensity exercise).  Most adults should also do strengthening exercises at least twice a week. This is in addition to the moderate-intensity exercise. Maintain a healthy weight  Body mass index (BMI) is a measurement that can be used to identify possible weight problems. It estimates body fat based on height and weight. Your health care provider can help determine your BMI and help you achieve or maintain a healthy weight.  For females 62 years of age and older:  A BMI below 18.5 is considered underweight.  A BMI of 18.5 to 24.9 is normal.  A BMI of 25 to 29.9 is considered overweight.  A BMI of 30 and above is considered obese. Watch levels of cholesterol and blood lipids  You should start having your blood tested for  lipids and cholesterol at 60 years of age, then have this test every 5 years.  You may need to have your cholesterol levels checked more often if:  Your lipid or cholesterol levels are high.  You are older than 60 years of age.  You are at high risk for heart disease. Cancer screening Lung Cancer  Lung cancer screening is recommended for adults 108-83 years old who are at high risk for lung cancer because of a history of smoking.  A yearly low-dose CT scan of the lungs is recommended for people who:  Currently smoke.  Have quit within the past 15 years.  Have at least a 30-pack-year history of smoking. A pack year is smoking an average of one pack of cigarettes a day for 1 year.  Yearly screening should continue until it has been 15 years since you quit.  Yearly screening should stop if you develop a health problem that would prevent you from having lung cancer treatment. Breast Cancer  Practice breast self-awareness. This means understanding how your breasts normally appear and feel.  It also means doing regular breast self-exams. Let your health care provider know about any changes, no matter how small.  If you are in your 20s or 30s, you should have a clinical breast exam (CBE) by a health care provider every 1-3 years as part of a regular health exam.  If you are 62 or older, have a CBE every year. Also consider having a breast  X-ray (mammogram) every year.  If you have a family history of breast cancer, talk to your health care provider about genetic screening.  If you are at high risk for breast cancer, talk to your health care provider about having an MRI and a mammogram every year.  Breast cancer gene (BRCA) assessment is recommended for women who have family members with BRCA-related cancers. BRCA-related cancers include:  Breast.  Ovarian.  Tubal.  Peritoneal cancers.  Results of the assessment will determine the need for genetic counseling and BRCA1 and  BRCA2 testing. Cervical Cancer  Your health care provider may recommend that you be screened regularly for cancer of the pelvic organs (ovaries, uterus, and vagina). This screening involves a pelvic examination, including checking for microscopic changes to the surface of your cervix (Pap test). You may be encouraged to have this screening done every 3 years, beginning at age 109.  For women ages 41-65, health care providers may recommend pelvic exams and Pap testing every 3 years, or they may recommend the Pap and pelvic exam, combined with testing for human papilloma virus (HPV), every 5 years. Some types of HPV increase your risk of cervical cancer. Testing for HPV may also be done on women of any age with unclear Pap test results.  Other health care providers may not recommend any screening for nonpregnant women who are considered low risk for pelvic cancer and who do not have symptoms. Ask your health care provider if a screening pelvic exam is right for you.  If you have had past treatment for cervical cancer or a condition that could lead to cancer, you need Pap tests and screening for cancer for at least 20 years after your treatment. If Pap tests have been discontinued, your risk factors (such as having a new sexual partner) need to be reassessed to determine if screening should resume. Some women have medical problems that increase the chance of getting cervical cancer. In these cases, your health care provider may recommend more frequent screening and Pap tests. Colorectal Cancer  This type of cancer can be detected and often prevented.  Routine colorectal cancer screening usually begins at 60 years of age and continues through 60 years of age.  Your health care provider may recommend screening at an earlier age if you have risk factors for colon cancer.  Your health care provider may also recommend using home test kits to check for hidden blood in the stool.  A small camera at the end  of a tube can be used to examine your colon directly (sigmoidoscopy or colonoscopy). This is done to check for the earliest forms of colorectal cancer.  Routine screening usually begins at age 27.  Direct examination of the colon should be repeated every 5-10 years through 60 years of age. However, you may need to be screened more often if early forms of precancerous polyps or small growths are found. Skin Cancer  Check your skin from head to toe regularly.  Tell your health care provider about any new moles or changes in moles, especially if there is a change in a mole's shape or color.  Also tell your health care provider if you have a mole that is larger than the size of a pencil eraser.  Always use sunscreen. Apply sunscreen liberally and repeatedly throughout the day.  Protect yourself by wearing long sleeves, pants, a wide-brimmed hat, and sunglasses whenever you are outside. Heart disease, diabetes, and high blood pressure  High blood pressure causes heart  disease and increases the risk of stroke. High blood pressure is more likely to develop in:  People who have blood pressure in the high end of the normal range (130-139/85-89 mm Hg).  People who are overweight or obese.  People who are African American.  If you are 65-41 years of age, have your blood pressure checked every 3-5 years. If you are 32 years of age or older, have your blood pressure checked every year. You should have your blood pressure measured twice-once when you are at a hospital or clinic, and once when you are not at a hospital or clinic. Record the average of the two measurements. To check your blood pressure when you are not at a hospital or clinic, you can use:  An automated blood pressure machine at a pharmacy.  A home blood pressure monitor.  If you are between 56 years and 92 years old, ask your health care provider if you should take aspirin to prevent strokes.  Have regular diabetes screenings.  This involves taking a blood sample to check your fasting blood sugar level.  If you are at a normal weight and have a low risk for diabetes, have this test once every three years after 60 years of age.  If you are overweight and have a high risk for diabetes, consider being tested at a younger age or more often. Preventing infection Hepatitis B  If you have a higher risk for hepatitis B, you should be screened for this virus. You are considered at high risk for hepatitis B if:  You were born in a country where hepatitis B is common. Ask your health care provider which countries are considered high risk.  Your parents were born in a high-risk country, and you have not been immunized against hepatitis B (hepatitis B vaccine).  You have HIV or AIDS.  You use needles to inject street drugs.  You live with someone who has hepatitis B.  You have had sex with someone who has hepatitis B.  You get hemodialysis treatment.  You take certain medicines for conditions, including cancer, organ transplantation, and autoimmune conditions. Hepatitis C  Blood testing is recommended for:  Everyone born from [birth year] through 1964.  Anyone with known risk factors for hepatitis C. Sexually transmitted infections (STIs)  You should be screened for sexually transmitted infections (STIs) including gonorrhea and chlamydia if:  You are sexually active and are younger than 60 years of age.  You are older than 60 years of age and your health care provider tells you that you are at risk for this type of infection.  Your sexual activity has changed since you were last screened and you are at an increased risk for chlamydia or gonorrhea. Ask your health care provider if you are at risk.  If you do not have HIV, but are at risk, it may be recommended that you take a prescription medicine daily to prevent HIV infection. This is called pre-exposure prophylaxis (PrEP). You are considered at risk if:  You are  sexually active and do not regularly use condoms or know the HIV status of your partner(s).  You take drugs by injection.  You are sexually active with a partner who has HIV. Talk with your health care provider about whether you are at high risk of being infected with HIV. If you choose to begin PrEP, you should first be tested for HIV. You should then be tested every 3 months for as long as you are taking PrEP.  Pregnancy  If you are premenopausal and you may become pregnant, ask your health care provider about preconception counseling.  If you may become pregnant, take 400 to 800 micrograms (mcg) of folic acid every day.  If you want to prevent pregnancy, talk to your health care provider about birth control (contraception). Osteoporosis and menopause  Osteoporosis is a disease in which the bones lose minerals and strength with aging. This can result in serious bone fractures. Your risk for osteoporosis can be identified using a bone density scan.  If you are 51 years of age or older, or if you are at risk for osteoporosis and fractures, ask your health care provider if you should be screened.  Ask your health care provider whether you should take a calcium or vitamin D supplement to lower your risk for osteoporosis.  Menopause may have certain physical symptoms and risks.  Hormone replacement therapy may reduce some of these symptoms and risks. Talk to your health care provider about whether hormone replacement therapy is right for you. Follow these instructions at home:  Schedule regular health, dental, and eye exams.  Stay current with your immunizations.  Do not use any tobacco products including cigarettes, chewing tobacco, or electronic cigarettes.  If you are pregnant, do not drink alcohol.  If you are breastfeeding, limit how much and how often you drink alcohol.  Limit alcohol intake to no more than 1 drink per day for nonpregnant women. One drink equals 12 ounces of  beer, 5 ounces of wine, or 1 ounces of hard liquor.  Do not use street drugs.  Do not share needles.  Ask your health care provider for help if you need support or information about quitting drugs.  Tell your health care provider if you often feel depressed.  Tell your health care provider if you have ever been abused or do not feel safe at home. This information is not intended to replace advice given to you by your health care provider. Make sure you discuss any questions you have with your health care provider. Document Released: 11/11/2010 Document Revised: 10/04/2015 Document Reviewed: 01/30/2015 Elsevier Interactive Patient Education  2017 [employer].

## 2016-09-01 NOTE — Progress Notes (Signed)
Pre visit review using our clinic review tool, if applicable. No additional management support is needed unless otherwise documented below in the visit note. 

## 2016-11-11 NOTE — Telephone Encounter (Signed)
error 

## 2016-12-14 ENCOUNTER — Other Ambulatory Visit: Payer: Self-pay | Admitting: Internal Medicine

## 2016-12-23 ENCOUNTER — Ambulatory Visit (INDEPENDENT_AMBULATORY_CARE_PROVIDER_SITE_OTHER): Payer: BC Managed Care – PPO | Admitting: Family

## 2016-12-23 ENCOUNTER — Encounter: Payer: Self-pay | Admitting: Family

## 2016-12-23 DIAGNOSIS — A084 Viral intestinal infection, unspecified: Secondary | ICD-10-CM | POA: Diagnosis not present

## 2016-12-23 MED ORDER — ONDANSETRON 4 MG PO TBDP: 4.0000 mg | ORAL_TABLET | Freq: Four times a day (QID) | ORAL | 0 refills | Status: DC | PRN

## 2016-12-23 MED ORDER — ONDANSETRON 4 MG PO TBDP
4.0000 mg | ORAL_TABLET | Freq: Once | ORAL | Status: AC
  Administered 2016-12-23: 4 mg via ORAL

## 2016-12-23 NOTE — Progress Notes (Signed)
Subjective:    Patient ID: Shannon Navarro, female    DOB: 1955-09-28, 61 y.o.   MRN: [id number]  Chief Complaint  Patient presents with  . Emesis    woke up yesterday feeling fine, ate yogurt and felt sick on her stomach she then threw up about 4 times yesterday, has thrown up today, not able to keep anything down and very sick on stomach today    HPI:  Shannon Navarro is a 61 y.o. female who  has a past medical history of [place]; ANXIETY; Fatty liver (06/23/07); GERD (07/2004); Hiatal hernia; HYPERGLYCEMIA; HYPERTENSION; Menopause; Migraine; Obesity, unspecified; OSA (obstructive sleep apnea) (01/16/2012); Paroxysmal atrial fibrillation ([place]) (02/2016); Pseudotumor cerebri (2007); Shingles; and Vitamin D deficiency (07/2013). and presents toda for an acute office visit.   This is a new problem. Associated symptoms of nausea and vomiting have been going on for about 1 day. Recalls waking up fine and then following a yogurt she felt sick to her stomach and vomited 4 times yesterday. Has vomited once today and and continues to feel nausea. Has had some loose stools as well. No fevers or chills. Has tried ginger ale and pepto bismol which does not help very much.   Allergies  Allergen Reactions  . Cefaclor Rash    REACTION: rash  . Codeine Nausea And Vomiting    REACTION: vomiting  . Hydrocodone Nausea And Vomiting    Severe vomiting  . Hydrocodone-Acetaminophen Nausea And Vomiting    COUGH SYRUP CAUSES VOMITING  . Levofloxacin Nausea Only    REACTION: Nausea  . Nexium [Esomeprazole Magnesium] Other (See Comments)    Made legs ache  . Penicillins Rash    REACTION: Rash  . Sulfonamide Derivatives Rash    REACTION: Rash      Outpatient Medications Prior to Visit  Medication Sig Dispense Refill  . Cholecalciferol (VITAMIN D3) 5000 UNITS TABS Take 5,000 Units by mouth daily. 30 tablet   . fluticasone (FLONASE) 50 MCG/ACT nasal spray Place 1 spray into both nostrils as  needed. 0.05 g 3  . loratadine (CLARITIN) 10 MG tablet Take 1 tablet (10 mg total) by mouth daily as needed for allergies. 30 tablet 2  . losartan (COZAAR) 100 MG tablet TAKE 1 TABLET DAILY 90 tablet 1  . propranolol (INDERAL) 20 MG tablet Take 1 tablet (20 mg total) by mouth 3 (three) times daily as needed. For Heartrate greater 120 only 90 tablet 3  . ranitidine (ZANTAC) 300 MG tablet Take 1 tablet (300 mg total) by mouth as needed. 90 tablet 3  . rivaroxaban (XARELTO) 20 MG TABS tablet Take 1 tablet (20 mg total) by mouth daily with supper. 90 tablet 1  . venlafaxine XR (EFFEXOR-XR) 75 MG 24 hr capsule TAKE 1 CAPSULE DAILY 90 capsule 3  . amLODipine (NORVASC) 5 MG tablet Take 1 tablet (5 mg total) by mouth daily. 30 tablet 3   No facility-administered medications prior to visit.      Past Medical History:  Diagnosis Date  . ALLERGIC RHINITIS   . ANXIETY   . Fatty liver 06/23/07  . GERD 07/2004   hx food impaction  . Hiatal hernia   . HYPERGLYCEMIA   . HYPERTENSION   . Menopause    @ 42  . Migraine   . Obesity, unspecified   . OSA (obstructive sleep apnea) 01/16/2012   started CPAP 02/2012  . Paroxysmal atrial fibrillation ([place]) 02/2016  . Pseudotumor cerebri 2007  . Shingles   .  Vitamin D deficiency 07/2013     Review of Systems  Constitutional: Positive for fatigue. Negative for chills and fever.  Gastrointestinal: Positive for nausea and vomiting. Negative for blood in stool and diarrhea.  Neurological: Positive for weakness.      Objective:    BP 124/82 (BP Location: Left Arm, Patient Position: Sitting, Cuff Size: Large)   Pulse (!) 49   Temp 98.3 F (36.8 C) (Oral)   Resp 16   Ht 5\' 6"  (1.676 m)   Wt 265 lb (120.2 kg)   SpO2 98%   BMI 42.77 kg/m  Nursing note and vital signs reviewed.  Physical Exam  Constitutional: She is oriented to person, place, and time. She appears well-developed and well-nourished. No distress.  Cardiovascular: Normal rate, regular  rhythm, normal heart sounds and intact distal pulses.   Pulmonary/Chest: Effort normal and breath sounds normal.  Abdominal: Normal appearance. She exhibits no mass. There is no hepatosplenomegaly. There is no tenderness. There is no rigidity, no rebound, no guarding, no tenderness at McBurney's point and negative Murphy's sign.  Neurological: She is alert and oriented to person, place, and time.  Skin: Skin is warm and dry.  Psychiatric: She has a normal mood and affect. Her behavior is normal. Judgment and thought content normal.      Assessment & Plan:   Problem List Items Addressed This Visit      Digestive   Viral gastroenteritis    Symptoms and exam consistent with viral gastroenteritis which appears to be slowly improving. Given 4 mg of ondansetron SL in office without complication. Start Zofran as needed for nausea. Supportive care measures and progress diet as tolerated.       Relevant Medications   ondansetron (ZOFRAN-ODT) disintegrating tablet 4 mg (Completed)       I am having Shannon Navarro start on ondansetron. I am also having her maintain her Vitamin D3, loratadine, fluticasone, propranolol, amLODipine, rivaroxaban, venlafaxine XR, ranitidine, and losartan. We administered ondansetron.   Meds ordered this encounter  Medications  . ondansetron (ZOFRAN-ODT) 4 MG disintegrating tablet    Sig: Take 1 tablet (4 mg total) by mouth every 6 (six) hours as needed for nausea or vomiting.    Dispense:  30 tablet    Refill:  0    Order Specific Question:   Supervising Provider    Answer:   Pricilla Holm A [[id number]]  . ondansetron (ZOFRAN-ODT) disintegrating tablet 4 mg     Follow-up: Return if symptoms worsen or fail to improve.  Mauricio Po, FNP

## 2016-12-23 NOTE — Assessment & Plan Note (Signed)
Symptoms and exam consistent with viral gastroenteritis which appears to be slowly improving. Given 4 mg of ondansetron SL in office without complication. Start Zofran as needed for nausea. Supportive care measures and progress diet as tolerated.

## 2016-12-23 NOTE — Patient Instructions (Addendum)
Thank you for choosing [facility].  SUMMARY AND INSTRUCTIONS:  Medication:  Your prescription(s) have been submitted to your pharmacy or been printed and provided for you. Please take as directed and contact our office if you believe you are having problem(s) with the medication(s) or have any questions.  Follow up:  If your symptoms worsen or fail to improve, please contact our office for further instruction, or in case of emergency go directly to the emergency room at the closest medical facility.    Viral Gastroenteritis, Adult Viral gastroenteritis is also known as the stomach flu. This condition is caused by certain germs (viruses). These germs can be passed from person to person very easily (are very contagious). This condition can cause sudden watery poop (diarrhea), fever, and throwing up (vomiting). Having watery poop and throwing up can make you feel weak and cause you to get dehydrated. Dehydration can make you tired and thirsty, make you have a dry mouth, and make it so you pee (urinate) less often. Older adults and people with other diseases or a weak defense system (immune system) are at higher risk for dehydration. It is important to replace the fluids that you lose from having watery poop and throwing up. Follow these instructions at home: Follow instructions from your doctor about how to care for yourself at home. Eating and drinking  Follow these instructions as told by your doctor:  Take an oral rehydration solution (ORS). This is a drink that is sold at pharmacies and stores.  Drink clear fluids in small amounts as you are able, such as: ? Water. ? Ice chips. ? Diluted fruit juice. ? Low-calorie sports drinks.  Eat bland, easy-to-digest foods in small amounts as you are able, such as: ? Bananas. ? Applesauce. ? Rice. ? Low-fat (lean) meats. ? Toast. ? Crackers.  Avoid fluids that have a lot of sugar or caffeine in them.  Avoid alcohol.  Avoid  spicy or fatty foods.  General instructions  Drink enough fluid to keep your pee (urine) clear or pale yellow.  Wash your hands often. If you cannot use soap and water, use hand sanitizer.  Make sure that all people in your home wash their hands well and often.  Rest at home while you get better.  Take over-the-counter and prescription medicines only as told by your doctor.  Watch your condition for any changes.  Take a warm bath to help with any burning or pain from having watery poop.  Keep all follow-up visits as told by your doctor. This is important. Contact a doctor if:  You cannot keep fluids down.  Your symptoms get worse.  You have new symptoms.  You feel light-headed or dizzy.  You have muscle cramps. Get help right away if:  You have chest pain.  You feel very weak or you pass out (faint).  You see blood in your throw-up.  Your throw-up looks like coffee grounds.  You have bloody or black poop (stools) or poop that look like tar.  You have a very bad headache, a stiff neck, or both.  You have a rash.  You have very bad pain, cramping, or bloating in your belly (abdomen).  You have trouble breathing.  You are breathing very quickly.  Your heart is beating very quickly.  Your skin feels cold and clammy.  You feel confused.  You have pain when you pee.  You have signs of dehydration, such as: ? Dark pee, hardly any pee, or no pee. ?  Cracked lips. ? Dry mouth. ? Sunken eyes. ? Sleepiness. ? Weakness. This information is not intended to replace advice given to you by your health care provider. Make sure you discuss any questions you have with your health care provider. Document Released: 10/15/2007 Document Revised: 11/16/2015 Document Reviewed: 01/02/2015 Elsevier Interactive Patient Education  2017 [place] Liquid Diet A clear liquid diet means that you only have liquids that you can see through. You do not eat any food on  this diet. Most people need to follow this diet for only a short time. What do I need to know about this diet?  A clear liquid is a liquid that you can see through when you hold it up to a light.  This diet does not give you all the nutrients that you need. Choose a variety of the liquids that your doctor says you can drink on this diet. That way, you will get as many nutrients as possible.  If you are not sure whether you can have certain items, ask your doctor. What can I have?  Water and flavored water.  Fruit juices that do not have pulp, such as cranberry juice and apple juice.  Tea and coffee without milk or cream.  Clear bouillon or broth.  Broth-based soups that have been strained.  Flavored gelatins.  Honey.  Sugar water.  Frozen ice or frozen ice pops that do not have any milk, yogurt, fruit pieces, or fruit pulp in them.  Clear sodas.  Clear sports drinks. The items listed above may not be a complete list of recommended liquids. Contact your food and nutrition expert (dietitian) for more options. What can I not have?  Juices that have pulp.  Milk.  Cream or cream-based soups.  Yogurt. The items listed above may not be a complete list of liquids to avoid. Contact your food and nutrition expert for more information. Summary  A clear liquid diet is a diet that includes only liquids that you can see through.  The goal of this diet is to help you recover.  Make sure to avoid liquids with milk, cream, or pulp while you are on this diet. This information is not intended to replace advice given to you by your health care provider. Make sure you discuss any questions you have with your health care provider. Document Released: 04/10/2008 Document Revised: 12/10/2015 Document Reviewed: 03/25/2013 Elsevier Interactive Patient Education  2017 Elsevier Inc.    Full Liquid Diet A full liquid diet may be used:  To help you transition from a clear liquid diet to a  soft diet.  When your body is healing and can only tolerate foods that are easy to digest.  Before or after certain a procedure, test, or surgery (such as stomach or intestinal surgeries).  If you have trouble swallowing or chewing.  A full liquid diet includes fluids and foods that are liquid or will become liquid at room temperature. The full liquid diet gives you the proteins, fluids, salts, and minerals that you need for energy. If you continue this diet for more than 72 hours, talk to your health care provider about how many calories you need to consume. If you continue the diet for more than 5 days, talk to your health care provider about taking a multivitamin or a nutritional supplement. What do I need to know about a full liquid diet?  You may have any liquid.  You may have any food that becomes a liquid at  room temperature. The food is considered a liquid if it can be poured off a spoon at room temperature.  Drink one serving of citrus or vitamin C-enriched fruit juice daily. What foods can I eat? Grains Any grain food that can be pureed in soup (such as crackers, pasta, and rice). Hot cereal (such as farina or oatmeal) that has been blended. Talk to your health care provider or dietitian about these foods. Vegetables Pulp-free tomato or vegetable juice. Vegetables pureed in soup. Fruits Fruit juice, including nectars and juices with pulp. Meats and Other Protein Sources Eggs in custard, eggnog mix, and eggs used in ice cream or pudding. Strained meats, like in baby food, may be allowed. Consult your health care provider. Dairy Milk and milk-based beverages, including milk shakes and instant breakfast mixes. Smooth yogurt. Pureed cottage cheese. Avoid these foods if they are not well tolerated. Beverages All beverages, including liquid nutritional supplements. Ask your health care provider if you can have carbonated beverages. They may not be well tolerated. Condiments Iodized  salt, pepper, spices, and flavorings. Cocoa powder. Vinegar, ketchup, yellow mustard, smooth sauces (such as hollandaise, cheese sauce, or white sauce), and soy sauce. Sweets and Desserts Custard, smooth pudding. Flavored gelatin. Tapioca, junket. Plain ice cream, sherbet, fruit ices. Frozen ice pops, frozen fudge pops, pudding pops, and other frozen bars with cream. Syrups, including chocolate syrup. Sugar, honey, jelly. Fats and Oils Margarine, butter, cream, sour cream, and oils. Other Broth and cream soups. Strained, broth-based soups. The items listed above may not be a complete list of recommended foods or beverages. Contact your dietitian for more options. What foods can I not eat? Grains All breads. Grains are not allowed unless they are pureed into soup. Vegetables Vegetables are not allowed unless they are juiced, or cooked and pureed into soup. Fruits Fruits are not allowed unless they are juiced. Meats and Other Protein Sources Any meat or fish. Cooked or raw eggs. Nut butters. Dairy Cheese. Condiments Stone ground mustards. Fats and Oils Fats that are coarse or chunky. Sweets and Desserts Ice cream or other frozen desserts that have any solids in them or on top, such as nuts, chocolate chips, and pieces of cookies. Cakes. Cookies. Candy. Others Soups with chunks or pieces in them. The items listed above may not be a complete list of foods and beverages to avoid. Contact your dietitian for more information. This information is not intended to replace advice given to you by your health care provider. Make sure you discuss any questions you have with your health care provider. Document Released: 04/28/2005 Document Revised: 10/04/2015 Document Reviewed: 03/03/2013 Elsevier Interactive Patient Education  2017 [place] Diet A bland diet consists of foods that do not have a lot of fat or fiber. Foods without fat or fiber are easier for the body to digest. They  are also less likely to irritate your mouth, throat, stomach, and other parts of your gastrointestinal tract. A bland diet is sometimes called a BRAT diet. What is my plan? Your health care provider or dietitian may recommend specific changes to your diet to prevent and treat your symptoms, such as:  Eating small meals often.  Cooking food until it is soft enough to chew easily.  Chewing your food well.  Drinking fluids slowly.  Not eating foods that are very spicy, sour, or fatty.  Not eating citrus fruits, such as oranges and grapefruit.  What do I need to know about this diet?  Eat  a variety of foods from the bland diet food list.  Do not follow a bland diet longer than you have to.  Ask your health care provider whether you should take vitamins. What foods can I eat? Grains  Hot cereals, such as cream of wheat. Bread, crackers, or tortillas made from refined white flour. Rice. Vegetables Canned or cooked vegetables. Mashed or boiled potatoes. Fruits Bananas. Applesauce. Other types of cooked or canned fruit with the skin and seeds removed, such as canned peaches or pears. Meats and Other Protein Sources Scrambled eggs. Creamy peanut butter or other nut butters. Lean, well-cooked meats, such as chicken or fish. Tofu. Soups or broths. Dairy Low-fat dairy products, such as milk, cottage cheese, or yogurt. Beverages Water. Herbal tea. Apple juice. Sweets and Desserts Pudding. Custard. Fruit gelatin. Ice cream. Fats and Oils Mild salad dressings. Canola or olive oil. The items listed above may not be a complete list of allowed foods or beverages. Contact your dietitian for more options. What foods are not recommended? Foods and ingredients that are often not recommended include:  Spicy foods, such as hot sauce or salsa.  Fried foods.  Sour foods, such as pickled or fermented foods.  Raw vegetables or fruits, especially citrus or berries.  Caffeinated  drinks.  Alcohol.  Strongly flavored seasonings or condiments.  The items listed above may not be a complete list of foods and beverages that are not allowed. Contact your dietitian for more information. This information is not intended to replace advice given to you by your health care provider. Make sure you discuss any questions you have with your health care provider. Document Released: 08/20/2015 Document Revised: 10/04/2015 Document Reviewed: 05/10/2014 Elsevier Interactive Patient Education  2018 [employer].

## 2017-01-02 LAB — HM PAP SMEAR

## 2017-01-02 LAB — CBC AND DIFFERENTIAL: Hemoglobin: 15.1 (ref 12.0–16.0)

## 2017-01-05 ENCOUNTER — Encounter: Payer: Self-pay | Admitting: Internal Medicine

## 2017-01-05 LAB — CHG URINALYSIS NONAUTO W/O SCOPE
BLOOD: NEGATIVE
Glucose: NEGATIVE
LEUKOCYTES UA: NEGATIVE
NITRITES UR: NEGATIVE
PROTEIN: NEGATIVE

## 2017-01-05 NOTE — Progress Notes (Signed)
Abstracted and sent to scan  

## 2017-02-18 ENCOUNTER — Telehealth: Payer: Self-pay | Admitting: Physician Assistant

## 2017-02-18 MED ORDER — AMLODIPINE BESYLATE 5 MG PO TABS: 5.0000 mg | ORAL_TABLET | Freq: Every day | ORAL | 1 refills | Status: DC

## 2017-02-18 MED ORDER — RIVAROXABAN 20 MG PO TABS: 20.0000 mg | ORAL_TABLET | Freq: Every day | ORAL | 1 refills | Status: DC

## 2017-02-18 NOTE — Telephone Encounter (Signed)
Received a call from [facility] requesting 90 day refills for Xarelto and Amlodipine.Advised I can send in 30 day locally, patient past due to see Dr.Hochrein.[facility] had patient on hold, call became a three way call.I spoke to patient appointment scheduled with Dr.Hochrein 03/05/17 at 8:40 am.30 day refills sent to [facility] in [place].

## 2017-03-04 ENCOUNTER — Telehealth: Payer: Self-pay | Admitting: Cardiology

## 2017-03-04 NOTE — Telephone Encounter (Signed)
Patient called and notified she can have flu vaccination at her visit tomorrow.

## 2017-03-04 NOTE — Progress Notes (Signed)
Cardiology Office Note   Date:  03/05/2017   ID:  Shannon Navarro, DOB January 11, 1956, MRN [id number]  PCP/referring MD:  [name], MD  Cardiologist:   Minus Breeding, MD    Chief Complaint  Patient presents with  . Atrial Fibrillation      History of Present Illness: Shannon Navarro is a 61 y.o. female who presents for evaluation of bradycardia.   She had an outpatient exercise tolerance test was ordered and completed on 06/13/2015, there was no evidence of ischemia, she was able to achieve 85% maximal heart rate after 6 minutes, no ST segment deviation was noted. The patient presented to the emergency room on 02/21/2016 with palpitation that woke her up from sleep and found to have new onset of atrial fibrillation. Patient spontaneously converted on diltiazem bolus. Given her elevated CHA2DS2-Vasc score 2 (female and HTN), she was placed on Xarelto.  She returns for follow up.  She is going to have a uterine polyp removed.  She has no cardiac complaints.  She has not felt any atrial fib.  The patient denies any new symptoms such as chest discomfort, neck or arm discomfort. There has been no new shortness of breath, PND or orthopnea. There have been no reported palpitations, presyncope or syncope.    Past Medical History:  Diagnosis Date  . ALLERGIC RHINITIS   . ANXIETY   . Fatty liver 06/23/07  . GERD 07/2004   hx food impaction  . Hiatal hernia   . HYPERGLYCEMIA   . HYPERTENSION   . Menopause    @ 17  . Migraine   . Obesity, unspecified   . OSA (obstructive sleep apnea) 01/16/2012   started CPAP 02/2012  . Paroxysmal atrial fibrillation ([place]) 02/2016  . Pseudotumor cerebri 2007  . Shingles   . Vitamin D deficiency 07/2013    Past Surgical History:  Procedure Laterality Date  . BTL  1995  . COLONOSCOPY    . DILATION AND CURETTAGE OF UTERUS  2010  . UPPER GASTROINTESTINAL ENDOSCOPY       Current Outpatient Prescriptions  Medication Sig Dispense Refill    . amLODipine (NORVASC) 5 MG tablet Take 1 tablet (5 mg total) by mouth daily. 30 tablet 1  . Cholecalciferol (VITAMIN D3) 5000 UNITS TABS Take 5,000 Units by mouth daily. 30 tablet   . fluticasone (FLONASE) 50 MCG/ACT nasal spray Place 1 spray into both nostrils as needed. 0.05 g 3  . loratadine (CLARITIN) 10 MG tablet Take 1 tablet (10 mg total) by mouth daily as needed for allergies. 30 tablet 2  . losartan (COZAAR) 100 MG tablet TAKE 1 TABLET DAILY 90 tablet 1  . ondansetron (ZOFRAN-ODT) 4 MG disintegrating tablet Take 1 tablet (4 mg total) by mouth every 6 (six) hours as needed for nausea or vomiting. 30 tablet 0  . propranolol (INDERAL) 20 MG tablet Take 1 tablet (20 mg total) by mouth 3 (three) times daily as needed. For Heartrate greater 120 only 30 tablet 3  . ranitidine (ZANTAC) 300 MG tablet Take 1 tablet (300 mg total) by mouth as needed. 90 tablet 3  . rivaroxaban (XARELTO) 20 MG TABS tablet Take 1 tablet (20 mg total) by mouth daily with supper. 30 tablet 1  . venlafaxine XR (EFFEXOR-XR) 75 MG 24 hr capsule TAKE 1 CAPSULE DAILY 90 capsule 3   No current facility-administered medications for this visit.     Allergies:   Cefaclor; Codeine; Hydrocodone; Hydrocodone-acetaminophen; Levofloxacin; Nexium [esomeprazole  magnesium]; Penicillins; and Sulfonamide derivatives    ROS:  Please see the history of present illness.   Otherwise, review of systems are positive for none.   All other systems are reviewed and negative.    PHYSICAL EXAM: VS:  BP 128/88   Pulse (!) 54   Ht 5\' 6"  (1.676 m)   Wt 266 lb (120.7 kg)   BMI 42.93 kg/m  , BMI Body mass index is 42.93 kg/m.  GENERAL:  Well appearing NECK:  No jugular venous distention, waveform within normal limits, carotid upstroke brisk and symmetric, no bruits, no thyromegaly LUNGS:  Clear to auscultation bilaterally CHEST:  Unremarkable HEART:  PMI not displaced or sustained,S1 and S2 within normal limits, no S3, no S4, no clicks,  no rubs, no murmurs ABD:  Flat, positive bowel sounds normal in frequency in pitch, no bruits, no rebound, no guarding, no midline pulsatile mass, no hepatomegaly, no splenomegaly EXT:  2 plus pulses throughout, no edema, no cyanosis no clubbing   EKG:  EKG is   not ordered today. Sinus bradycardia, rate 54, axis within normal limits, intervals within normal limits, no acute ST-T wave changes.   Recent Labs: 09/01/2016: ALT 17; BUN 13; Creatinine, Ser 0.77; Platelets 260.0; Potassium 4.1; Sodium 142; TSH 1.39 01/02/2017: Hemoglobin 15.1    Lipid Panel    Component Value Date/Time   CHOL 152 09/01/2016 0843   TRIG 71.0 09/01/2016 0843   HDL 42.10 09/01/2016 0843   CHOLHDL 4 09/01/2016 0843   VLDL 14.2 09/01/2016 0843   LDLCALC 96 09/01/2016 0843      Wt Readings from Last 3 Encounters:  03/05/17 266 lb (120.7 kg)  12/23/16 265 lb (120.2 kg)  09/01/16 268 lb (121.6 kg)     Other studies Reviewed: Additional studies/ records that were reviewed today include: None Review of the above records demonstrates:     ASSESSMENT AND PLAN:  PREOP:  The patient would be at acceptable risk for the planned procedure.  No further preop work up is indicated.   PAF:  [name] has a CHA2DS2 - VASc score of 2.   She tolerates the anticoagulation.  No change in therapy is planned.  She can hold the Xarelto for two days prior to surgery.   MR:  This was mild and I will follow this clinically.   HTN:  The blood pressure is at target. No change in medications is indicated. We will continue with therapeutic lifestyle changes (TLC).  BRADYCARDIA:  This is not causing any symptoms.  No change in therapy.   OBSTRUCTIVE SLEEP APNEA:  She uses CPAP.  Current medicines are reviewed at length with the patient today.  The patient does not have concerns regarding medicines.  The following changes have been made:  None  Labs/ tests ordered today include:  None  Orders Placed This  Encounter  Procedures  . EKG 12-Lead     Disposition:   FU with me in 12 months.    Signed, Minus Breeding, MD  03/05/2017 9:22 AM    [facility]

## 2017-03-04 NOTE — Telephone Encounter (Signed)
New message   Pt verbalized that she is calling to ask can she have a flu shot   When she come into appt 03/05/2017

## 2017-03-05 ENCOUNTER — Ambulatory Visit (INDEPENDENT_AMBULATORY_CARE_PROVIDER_SITE_OTHER): Payer: BC Managed Care – PPO | Admitting: Cardiology

## 2017-03-05 ENCOUNTER — Ambulatory Visit: Payer: BC Managed Care – PPO

## 2017-03-05 ENCOUNTER — Encounter: Payer: Self-pay | Admitting: Cardiology

## 2017-03-05 VITALS — BP 128/88 | HR 54 | Ht 66.0 in | Wt 266.0 lb

## 2017-03-05 DIAGNOSIS — I10 Essential (primary) hypertension: Secondary | ICD-10-CM

## 2017-03-05 DIAGNOSIS — I480 Paroxysmal atrial fibrillation: Secondary | ICD-10-CM | POA: Insufficient documentation

## 2017-03-05 MED ORDER — PROPRANOLOL HCL 20 MG PO TABS: 20.0000 mg | ORAL_TABLET | Freq: Three times a day (TID) | ORAL | 3 refills | Status: DC | PRN

## 2017-03-05 MED ORDER — DILTIAZEM HCL 30 MG PO TABS: 30.0000 mg | ORAL_TABLET | ORAL | 3 refills | Status: DC | PRN

## 2017-03-05 NOTE — Patient Instructions (Addendum)
Medication Instructions:  None Ordered  If you need a refill on your cardiac medications before your next appointment, please call your pharmacy.  Labwork: None Ordered   Testing/Procedures: None Ordered   Follow-Up: Your physician wants you to follow-up in: 1 Year. You should receive a reminder letter in the mail two months in advance. If you do not receive a letter, please call our office [phone].  Thank you for choosing [facility] at [facility]!!

## 2017-03-10 ENCOUNTER — Other Ambulatory Visit: Payer: Self-pay | Admitting: Obstetrics and Gynecology

## 2017-03-10 DIAGNOSIS — Z1231 Encounter for screening mammogram for malignant neoplasm of breast: Secondary | ICD-10-CM

## 2017-03-12 ENCOUNTER — Telehealth: Payer: Self-pay | Admitting: Internal Medicine

## 2017-03-12 MED ORDER — LOSARTAN POTASSIUM 100 MG PO TABS: 100.0000 mg | ORAL_TABLET | Freq: Every day | ORAL | 1 refills | Status: DC

## 2017-03-12 MED ORDER — AMLODIPINE BESYLATE 5 MG PO TABS: 5.0000 mg | ORAL_TABLET | Freq: Every day | ORAL | 1 refills | Status: DC

## 2017-03-12 MED ORDER — VENLAFAXINE HCL ER 75 MG PO CP24: 75.0000 mg | ORAL_CAPSULE | Freq: Every day | ORAL | 1 refills | Status: DC

## 2017-03-12 NOTE — Telephone Encounter (Signed)
Pt called requesting a refill on losartan (COZAAR) 100 MG tablet, amLODipine (NORVASC) 5 MG tablet and venlafaxine XR (EFFEXOR-XR) 75 MG 24 hr capsule sent to [facility] as soon as possible.

## 2017-03-12 NOTE — Telephone Encounter (Signed)
Reviewed chart pt is up-to-date sent refills to [facility]  

## 2017-03-20 ENCOUNTER — Encounter (HOSPITAL_BASED_OUTPATIENT_CLINIC_OR_DEPARTMENT_OTHER): Payer: Self-pay | Admitting: *Deleted

## 2017-03-23 NOTE — Progress Notes (Signed)
Reviewing pre-orders for this pt. Noted T&S was ordered.  Called and lm for dr Marvel Plan nurse, becky, via phone to ask dr Marvel Plan if this was needed for this procedure. Do not want  patient to be charged for something that she did mean to order.

## 2017-03-24 ENCOUNTER — Other Ambulatory Visit: Payer: Self-pay

## 2017-03-24 ENCOUNTER — Encounter (HOSPITAL_BASED_OUTPATIENT_CLINIC_OR_DEPARTMENT_OTHER): Payer: Self-pay | Admitting: *Deleted

## 2017-03-24 ENCOUNTER — Encounter (HOSPITAL_COMMUNITY)
Admission: RE | Admit: 2017-03-24 | Discharge: 2017-03-24 | Disposition: A | Discharge status: Home / Self Care | Payer: BC Managed Care – PPO | Source: Ambulatory Visit | Attending: Obstetrics and Gynecology | Admitting: Obstetrics and Gynecology

## 2017-03-24 DIAGNOSIS — Z79899 Other long term (current) drug therapy: Secondary | ICD-10-CM | POA: Diagnosis not present

## 2017-03-24 DIAGNOSIS — Z882 Allergy status to sulfonamides status: Secondary | ICD-10-CM | POA: Diagnosis not present

## 2017-03-24 DIAGNOSIS — G4733 Obstructive sleep apnea (adult) (pediatric): Secondary | ICD-10-CM | POA: Diagnosis not present

## 2017-03-24 DIAGNOSIS — N8500 Endometrial hyperplasia, unspecified: Secondary | ICD-10-CM | POA: Diagnosis not present

## 2017-03-24 DIAGNOSIS — Z6841 Body Mass Index (BMI) 40.0 and over, adult: Secondary | ICD-10-CM | POA: Diagnosis not present

## 2017-03-24 DIAGNOSIS — Z881 Allergy status to other antibiotic agents status: Secondary | ICD-10-CM | POA: Diagnosis not present

## 2017-03-24 DIAGNOSIS — Z803 Family history of malignant neoplasm of breast: Secondary | ICD-10-CM | POA: Diagnosis not present

## 2017-03-24 DIAGNOSIS — F419 Anxiety disorder, unspecified: Secondary | ICD-10-CM | POA: Diagnosis not present

## 2017-03-24 DIAGNOSIS — Z7951 Long term (current) use of inhaled steroids: Secondary | ICD-10-CM | POA: Diagnosis not present

## 2017-03-24 DIAGNOSIS — Z8042 Family history of malignant neoplasm of prostate: Secondary | ICD-10-CM | POA: Diagnosis not present

## 2017-03-24 DIAGNOSIS — N840 Polyp of corpus uteri: Secondary | ICD-10-CM | POA: Diagnosis not present

## 2017-03-24 DIAGNOSIS — G932 Benign intracranial hypertension: Secondary | ICD-10-CM | POA: Diagnosis not present

## 2017-03-24 DIAGNOSIS — R9389 Abnormal findings on diagnostic imaging of other specified body structures: Secondary | ICD-10-CM | POA: Diagnosis present

## 2017-03-24 DIAGNOSIS — K449 Diaphragmatic hernia without obstruction or gangrene: Secondary | ICD-10-CM | POA: Diagnosis not present

## 2017-03-24 DIAGNOSIS — Z885 Allergy status to narcotic agent status: Secondary | ICD-10-CM | POA: Diagnosis not present

## 2017-03-24 DIAGNOSIS — E559 Vitamin D deficiency, unspecified: Secondary | ICD-10-CM | POA: Diagnosis not present

## 2017-03-24 DIAGNOSIS — K219 Gastro-esophageal reflux disease without esophagitis: Secondary | ICD-10-CM | POA: Diagnosis not present

## 2017-03-24 DIAGNOSIS — Z9889 Other specified postprocedural states: Secondary | ICD-10-CM | POA: Diagnosis not present

## 2017-03-24 DIAGNOSIS — Z880 Allergy status to penicillin: Secondary | ICD-10-CM | POA: Diagnosis not present

## 2017-03-24 DIAGNOSIS — I10 Essential (primary) hypertension: Secondary | ICD-10-CM | POA: Diagnosis not present

## 2017-03-24 DIAGNOSIS — Z7901 Long term (current) use of anticoagulants: Secondary | ICD-10-CM | POA: Diagnosis not present

## 2017-03-24 DIAGNOSIS — Z8371 Family history of colonic polyps: Secondary | ICD-10-CM | POA: Diagnosis not present

## 2017-03-24 DIAGNOSIS — Z888 Allergy status to other drugs, medicaments and biological substances status: Secondary | ICD-10-CM | POA: Diagnosis not present

## 2017-03-24 DIAGNOSIS — I480 Paroxysmal atrial fibrillation: Secondary | ICD-10-CM | POA: Diagnosis not present

## 2017-03-24 LAB — CBC
HCT: 41.8 % (ref 36.0–46.0)
HEMOGLOBIN: 13.9 g/dL (ref 12.0–15.0)
MCH: 28.4 pg (ref 26.0–34.0)
MCHC: 33.3 g/dL (ref 30.0–36.0)
MCV: 85.5 fL (ref 78.0–100.0)
PLATELETS: 275 10*3/uL (ref 150–400)
RBC: 4.89 MIL/uL (ref 3.87–5.11)
RDW: 13.4 % (ref 11.5–15.5)
WBC: 5.6 10*3/uL (ref 4.0–10.5)

## 2017-03-24 LAB — BASIC METABOLIC PANEL
Anion gap: 7 (ref 5–15)
BUN: 13 mg/dL (ref 6–20)
CALCIUM: 9 mg/dL (ref 8.9–10.3)
CO2: 26 mmol/L (ref 22–32)
CREATININE: 0.83 mg/dL (ref 0.44–1.00)
Chloride: 106 mmol/L (ref 101–111)
Glucose, Bld: 92 mg/dL (ref 65–99)
Potassium: 4 mmol/L (ref 3.5–5.1)
SODIUM: 139 mmol/L (ref 135–145)

## 2017-03-24 NOTE — Progress Notes (Signed)
Npo after midnight, take amlodipine, certraziine, losartan, bring cpap, nasal cushion, and machine arrive 530 am 03-26-17 [facility], husband driver, ekg 70-35-00 ekg and on chart, lov dr [name] 03-05-17 cardiology epic and on chart, stress test 06-14-15 epic and on chart Echo 03-12-16 epic and on chart

## 2017-03-25 NOTE — H&P (Signed)
Shannon Navarro is an 61 y.o. female G2P2 who presents for scheduled hysteroscopy with polypectomy after office workup revealed 2 probable polyps on SIUS. Pt had a h/o thickened endometrial lining years ago, and began to feel bloated, but had no bleeding.  An office US showed her endometrium to be 1 cm thick so subsequent endometrial biopsy was done and benign and a SIUS appeared to demonstrate 2 polyps.  Pt has a h/o sleep apnea and was felt to be a more appropriate candidate for hysteroscopy in the hospital since potential airway issues.  She was on xarelto, but came off a few days prior to surgery.  Pertinent Gynecological History:  OB History:NSVD x 2  Menstrual History:  No LMP recorded. Patient is postmenopausal.    Past Medical History:  Diagnosis Date  . Allergic rhinitis   . Anticoagulant long-term use    XARELTO  . ANXIETY   . Endometrial polyp   . Endometrial polyp   . Fatty liver 06/23/07  . GERD   . Hiatal hernia   . History of esophageal stricture    mainly due to gerd  . History of exercise stress test 06/13/2015   PER DR HOCHREIN NOTE -- NO EVIDENCE ISCHEMIA  . HYPERTENSION   . Impaired fasting glucose   . Migraine    hx of migraines none recent  . OSA on CPAP followed by dr Halford Chessman   per study 02-09-2012  severe osa w/ AHI 33.8  . Paroxysmal atrial fibrillation [facility]) cardioloigst-  dr hochrein   dx 02-21-2016 at ED  . Pseudotumor cerebri 2007  . Vitamin D deficiency 07/2013    Past Surgical History:  Procedure Laterality Date  . COLONOSCOPY    . colonscopy  2007   normal  . DILATION AND CURETTAGE OF UTERUS  2010  . right knee arthroscopy  03/2013  . TRANSTHORACIC ECHOCARDIOGRAM  03/11/2016   ef 60-65%/  mild MR/ trivial PR and TR  . TUBAL LIGATION    . UPPER GASTROINTESTINAL ENDOSCOPY      Family History  Problem Relation Age of Onset  . Breast cancer Mother   . Asthma Mother   . Hypertension Mother   . Prostate cancer Father   . Aortic stenosis  Father   . Hypertension Father   . Colon polyps Father   . Heart disease Father   . Diabetes Father   . Colon cancer Neg Hx   . Esophageal cancer Neg Hx   . Stomach cancer Neg Hx   . Rectal cancer Neg Hx     Social History:  reports that  has never smoked. she has never used smokeless tobacco. She reports that she does not drink alcohol or use drugs.  Allergies:  Allergies  Allergen Reactions  . Cefaclor Rash  . Codeine Nausea And Vomiting  . Hydrocodone Nausea And Vomiting    Severe vomiting/  COUGH SYRUP CAUSE SEVERE VOMITING  . Levaquin [Levofloxacin] Nausea Only  . Nexium [Esomeprazole Magnesium] Other (See Comments)    Made legs ache  . Penicillins Rash  . Sulfa Antibiotics Rash    No medications prior to admission.    Review of Systems  Gastrointestinal: Negative for abdominal pain.  Neurological: Negative for headaches.    Height 5\' 6"  (1.676 m), weight 117.9 kg (260 lb). Physical Exam  Constitutional: She appears well-developed and well-nourished.  Cardiovascular: Normal rate and regular rhythm.  Respiratory: Effort normal.  Genitourinary: Vagina normal and uterus normal.  Neurological: She is alert.  Psychiatric: She has a normal mood and affect.    No results found for this or any previous visit (from the past 24 hour(s)).  No results found.  Assessment/Plan: The patient was counseled regarding the hysteroscopy procedure in detail. We reviewed risks of bleeding and infection and possible uterine perforation. We also discussed the removal of any identified polyps or submucosal fibroids and what that would involve. The patient desires to proceed. She will use cytotec 426mcg 3 hours prior to the procedure to aid with cervical dilation.   Logan Bores 03/25/2017, 9:20 PM

## 2017-03-26 ENCOUNTER — Encounter (HOSPITAL_BASED_OUTPATIENT_CLINIC_OR_DEPARTMENT_OTHER): Payer: Self-pay

## 2017-03-26 ENCOUNTER — Ambulatory Visit (HOSPITAL_BASED_OUTPATIENT_CLINIC_OR_DEPARTMENT_OTHER)
Admission: RE | Admit: 2017-03-26 | Discharge: 2017-03-26 | Disposition: A | Discharge status: Home / Self Care | Payer: BC Managed Care – PPO | Source: Ambulatory Visit | Attending: Obstetrics and Gynecology | Admitting: Obstetrics and Gynecology

## 2017-03-26 ENCOUNTER — Encounter (HOSPITAL_BASED_OUTPATIENT_CLINIC_OR_DEPARTMENT_OTHER)
Admission: RE | Disposition: A | Discharge status: Home / Self Care | Payer: Self-pay | Source: Ambulatory Visit | Attending: Obstetrics and Gynecology

## 2017-03-26 ENCOUNTER — Ambulatory Visit (HOSPITAL_BASED_OUTPATIENT_CLINIC_OR_DEPARTMENT_OTHER): Payer: BC Managed Care – PPO | Admitting: Anesthesiology

## 2017-03-26 DIAGNOSIS — Z803 Family history of malignant neoplasm of breast: Secondary | ICD-10-CM | POA: Insufficient documentation

## 2017-03-26 DIAGNOSIS — G4733 Obstructive sleep apnea (adult) (pediatric): Secondary | ICD-10-CM | POA: Insufficient documentation

## 2017-03-26 DIAGNOSIS — I10 Essential (primary) hypertension: Secondary | ICD-10-CM | POA: Insufficient documentation

## 2017-03-26 DIAGNOSIS — E559 Vitamin D deficiency, unspecified: Secondary | ICD-10-CM | POA: Insufficient documentation

## 2017-03-26 DIAGNOSIS — G932 Benign intracranial hypertension: Secondary | ICD-10-CM | POA: Insufficient documentation

## 2017-03-26 DIAGNOSIS — N8500 Endometrial hyperplasia, unspecified: Secondary | ICD-10-CM | POA: Insufficient documentation

## 2017-03-26 DIAGNOSIS — N840 Polyp of corpus uteri: Secondary | ICD-10-CM | POA: Diagnosis not present

## 2017-03-26 DIAGNOSIS — K219 Gastro-esophageal reflux disease without esophagitis: Secondary | ICD-10-CM | POA: Insufficient documentation

## 2017-03-26 DIAGNOSIS — Z825 Family history of asthma and other chronic lower respiratory diseases: Secondary | ICD-10-CM | POA: Insufficient documentation

## 2017-03-26 DIAGNOSIS — Z9889 Other specified postprocedural states: Secondary | ICD-10-CM | POA: Insufficient documentation

## 2017-03-26 DIAGNOSIS — F419 Anxiety disorder, unspecified: Secondary | ICD-10-CM | POA: Insufficient documentation

## 2017-03-26 DIAGNOSIS — Z888 Allergy status to other drugs, medicaments and biological substances status: Secondary | ICD-10-CM | POA: Insufficient documentation

## 2017-03-26 DIAGNOSIS — Z7951 Long term (current) use of inhaled steroids: Secondary | ICD-10-CM | POA: Insufficient documentation

## 2017-03-26 DIAGNOSIS — Z885 Allergy status to narcotic agent status: Secondary | ICD-10-CM | POA: Insufficient documentation

## 2017-03-26 DIAGNOSIS — Z7901 Long term (current) use of anticoagulants: Secondary | ICD-10-CM | POA: Insufficient documentation

## 2017-03-26 DIAGNOSIS — Z880 Allergy status to penicillin: Secondary | ICD-10-CM | POA: Insufficient documentation

## 2017-03-26 DIAGNOSIS — Z881 Allergy status to other antibiotic agents status: Secondary | ICD-10-CM | POA: Insufficient documentation

## 2017-03-26 DIAGNOSIS — I480 Paroxysmal atrial fibrillation: Secondary | ICD-10-CM | POA: Insufficient documentation

## 2017-03-26 DIAGNOSIS — Z8042 Family history of malignant neoplasm of prostate: Secondary | ICD-10-CM | POA: Insufficient documentation

## 2017-03-26 DIAGNOSIS — Z882 Allergy status to sulfonamides status: Secondary | ICD-10-CM | POA: Insufficient documentation

## 2017-03-26 DIAGNOSIS — K449 Diaphragmatic hernia without obstruction or gangrene: Secondary | ICD-10-CM | POA: Insufficient documentation

## 2017-03-26 DIAGNOSIS — Z8249 Family history of ischemic heart disease and other diseases of the circulatory system: Secondary | ICD-10-CM | POA: Insufficient documentation

## 2017-03-26 DIAGNOSIS — Z79899 Other long term (current) drug therapy: Secondary | ICD-10-CM | POA: Insufficient documentation

## 2017-03-26 DIAGNOSIS — Z6841 Body Mass Index (BMI) 40.0 and over, adult: Secondary | ICD-10-CM | POA: Insufficient documentation

## 2017-03-26 DIAGNOSIS — Z8371 Family history of colonic polyps: Secondary | ICD-10-CM | POA: Insufficient documentation

## 2017-03-26 HISTORY — PX: DILATATION & CURETTAGE/HYSTEROSCOPY WITH MYOSURE: SHX6511

## 2017-03-26 HISTORY — DX: Long term (current) use of anticoagulants: Z79.01

## 2017-03-26 HISTORY — DX: Personal history of other medical treatment: Z92.89

## 2017-03-26 HISTORY — DX: Polyp of corpus uteri: N84.0

## 2017-03-26 HISTORY — DX: Allergic rhinitis, unspecified: J30.9

## 2017-03-26 HISTORY — DX: Obstructive sleep apnea (adult) (pediatric): G47.33

## 2017-03-26 HISTORY — DX: Impaired fasting glucose: R73.01

## 2017-03-26 HISTORY — DX: Personal history of other diseases of the digestive system: Z87.19

## 2017-03-26 HISTORY — DX: Dependence on other enabling machines and devices: Z99.89

## 2017-03-26 SURGERY — DILATATION & CURETTAGE/HYSTEROSCOPY WITH MYOSURE
Anesthesia: General | Site: Vagina

## 2017-03-26 MED ORDER — MIDAZOLAM HCL 2 MG/2ML IJ SOLN
INTRAMUSCULAR | Status: AC
  Filled 2017-03-26: qty 2

## 2017-03-26 MED ORDER — FENTANYL CITRATE (PF) 100 MCG/2ML IJ SOLN
INTRAMUSCULAR | Status: AC
  Filled 2017-03-26: qty 2

## 2017-03-26 MED ORDER — MIDAZOLAM HCL 5 MG/5ML IJ SOLN
INTRAMUSCULAR | Status: DC | PRN
  Administered 2017-03-26: 2 mg via INTRAVENOUS

## 2017-03-26 MED ORDER — ONDANSETRON HCL 4 MG/2ML IJ SOLN
INTRAMUSCULAR | Status: DC | PRN
  Administered 2017-03-26: 4 mg via INTRAVENOUS

## 2017-03-26 MED ORDER — ACETAMINOPHEN 500 MG PO TABS
ORAL_TABLET | ORAL | Status: AC
  Filled 2017-03-26: qty 2

## 2017-03-26 MED ORDER — FENTANYL CITRATE (PF) 100 MCG/2ML IJ SOLN
25.0000 ug | INTRAMUSCULAR | Status: DC | PRN
  Administered 2017-03-26: 25 ug via INTRAVENOUS
  Filled 2017-03-26: qty 1

## 2017-03-26 MED ORDER — LIDOCAINE HCL (CARDIAC) 20 MG/ML IV SOLN
INTRAVENOUS | Status: DC | PRN
  Administered 2017-03-26: 20 mg via INTRAVENOUS
  Administered 2017-03-26: 80 mg via INTRAVENOUS

## 2017-03-26 MED ORDER — PROPOFOL 10 MG/ML IV BOLUS
INTRAVENOUS | Status: AC
  Filled 2017-03-26: qty 40

## 2017-03-26 MED ORDER — SODIUM CHLORIDE 0.9 % IR SOLN
Status: DC | PRN
  Administered 2017-03-26: 3000 mL

## 2017-03-26 MED ORDER — DEXAMETHASONE SODIUM PHOSPHATE 10 MG/ML IJ SOLN
INTRAMUSCULAR | Status: AC
  Filled 2017-03-26: qty 1

## 2017-03-26 MED ORDER — PROPOFOL 10 MG/ML IV BOLUS
INTRAVENOUS | Status: DC | PRN
Start: 2017-03-26 — End: 2017-03-26
  Administered 2017-03-26: 100 mg via INTRAVENOUS
  Administered 2017-03-26: 200 mg via INTRAVENOUS

## 2017-03-26 MED ORDER — ONDANSETRON HCL 4 MG/2ML IJ SOLN
INTRAMUSCULAR | Status: AC
  Filled 2017-03-26: qty 2

## 2017-03-26 MED ORDER — LIDOCAINE HCL 1 % IJ SOLN
INTRAMUSCULAR | Status: DC | PRN
  Administered 2017-03-26: 20 mL

## 2017-03-26 MED ORDER — LACTATED RINGERS IV SOLN
INTRAVENOUS | Status: DC
  Administered 2017-03-26 (×2): via INTRAVENOUS
  Filled 2017-03-26: qty 1000

## 2017-03-26 MED ORDER — ONDANSETRON HCL 4 MG/2ML IJ SOLN
4.0000 mg | Freq: Once | INTRAMUSCULAR | Status: DC | PRN
  Filled 2017-03-26: qty 2

## 2017-03-26 MED ORDER — LIDOCAINE 2% (20 MG/ML) 5 ML SYRINGE
INTRAMUSCULAR | Status: AC
  Filled 2017-03-26: qty 10

## 2017-03-26 MED ORDER — ACETAMINOPHEN 500 MG PO TABS
1000.0000 mg | ORAL_TABLET | Freq: Once | ORAL | Status: AC
  Administered 2017-03-26: 1000 mg via ORAL
  Filled 2017-03-26: qty 2

## 2017-03-26 MED ORDER — LACTATED RINGERS IV SOLN
INTRAVENOUS | Status: DC
  Administered 2017-03-26: 07:00:00 via INTRAVENOUS
  Filled 2017-03-26: qty 1000

## 2017-03-26 MED ORDER — DEXAMETHASONE SODIUM PHOSPHATE 10 MG/ML IJ SOLN
INTRAMUSCULAR | Status: DC | PRN
  Administered 2017-03-26: 10 mg via INTRAVENOUS

## 2017-03-26 MED ORDER — FENTANYL CITRATE (PF) 100 MCG/2ML IJ SOLN
INTRAMUSCULAR | Status: DC | PRN
  Administered 2017-03-26 (×2): 25 ug via INTRAVENOUS
  Administered 2017-03-26: 50 ug via INTRAVENOUS

## 2017-03-26 SURGICAL SUPPLY — 21 items
CANISTER SUCT 3000ML PPV (MISCELLANEOUS) ×6 IMPLANT
CATH ROBINSON RED A/P 16FR (CATHETERS) ×3 IMPLANT
DEVICE MYOSURE LITE (MISCELLANEOUS) ×3 IMPLANT
DEVICE MYOSURE REACH (MISCELLANEOUS) IMPLANT
FILTER ARTHROSCOPY CONVERTOR (FILTER) ×3 IMPLANT
GLOVE BIO SURGEON STRL SZ7 (GLOVE) ×3 IMPLANT
GLOVE BIOGEL PI IND STRL 7.0 (GLOVE) ×1 IMPLANT
GLOVE BIOGEL PI INDICATOR 7.0 (GLOVE) ×2
GOWN STRL REUS W/ TWL LRG LVL3 (GOWN DISPOSABLE) ×1 IMPLANT
GOWN STRL REUS W/TWL LRG LVL3 (GOWN DISPOSABLE) ×2
IV NS IRRIG 3000ML ARTHROMATIC (IV SOLUTION) ×3 IMPLANT
KIT RM TURNOVER CYSTO AR (KITS) ×3 IMPLANT
MYOSURE XL FIBROID REM (MISCELLANEOUS)
NEEDLE SPNL 22GX3.5 QUINCKE BK (NEEDLE) ×3 IMPLANT
PACK VAGINAL MINOR WOMEN LF (CUSTOM PROCEDURE TRAY) ×3 IMPLANT
PAD OB MATERNITY 4.3X12.25 (PERSONAL CARE ITEMS) ×3 IMPLANT
SEAL ROD LENS SCOPE MYOSURE (ABLATOR) ×3 IMPLANT
SYSTEM TISS REMOVAL MYSR XL RM (MISCELLANEOUS) IMPLANT
TOWEL OR 17X24 6PK STRL BLUE (TOWEL DISPOSABLE) ×6 IMPLANT
TUBING AQUILEX INFLOW (TUBING) ×3 IMPLANT
TUBING AQUILEX OUTFLOW (TUBING) ×3 IMPLANT

## 2017-03-26 NOTE — Anesthesia Preprocedure Evaluation (Signed)
Anesthesia Evaluation  Patient identified by MRN, date of birth, ID band Patient awake    Reviewed: Allergy & Precautions, NPO status , Patient's Chart, lab work & pertinent test results  Airway Mallampati: II  TM Distance: >3 FB Neck ROM: Full    Dental  (+) Teeth Intact, Dental Advisory Given   Pulmonary sleep apnea and Continuous Positive Airway Pressure Ventilation ,    Pulmonary exam normal breath sounds clear to auscultation       Cardiovascular hypertension, Pt. on medications and Pt. on home beta blockers Normal cardiovascular exam+ dysrhythmias (PAF)  Rhythm:Regular Rate:Normal     Neuro/Psych  Headaches, PSYCHIATRIC DISORDERS Anxiety Pseudotumor cerebri    GI/Hepatic Neg liver ROS, hiatal hernia, GERD  Medicated,  Endo/Other  Morbid obesity  Renal/GU negative Renal ROS     Musculoskeletal negative musculoskeletal ROS (+)   Abdominal   Peds  Hematology  (+) Blood dyscrasia (Xarelto), ,   Anesthesia Other Findings Day of surgery medications reviewed with the patient.  Reproductive/Obstetrics Polyp of corpus uteri                             Anesthesia Physical Anesthesia Plan  ASA: III  Anesthesia Plan: General   Post-op Pain Management:    Induction: Intravenous  PONV Risk Score and Plan: 4 or greater and Dexamethasone, Ondansetron and Midazolam  Airway Management Planned: LMA  Additional Equipment:   Intra-op Plan:   Post-operative Plan: Extubation in OR  Informed Consent: I have reviewed the patients History and Physical, chart, labs and discussed the procedure including the risks, benefits and alternatives for the proposed anesthesia with the patient or authorized representative who has indicated his/her understanding and acceptance.   Dental advisory given  Plan Discussed with: CRNA  Anesthesia Plan Comments: (Risks/benefits of general anesthesia discussed  with patient including risk of damage to teeth, lips, gum, and tongue, nausea/vomiting, allergic reactions to medications, and the possibility of heart attack, stroke and death.  All patient questions answered.  Patient wishes to proceed.)        Anesthesia Quick Evaluation

## 2017-03-26 NOTE — Anesthesia Postprocedure Evaluation (Signed)
Anesthesia Post Note  Patient: Shannon Navarro  Procedure(s) Performed: DILATATION & CURETTAGE/HYSTEROSCOPY WITH MYOSURE (N/A Vagina )     Patient location during evaluation: PACU Anesthesia Type: General Level of consciousness: awake and alert Pain management: pain level controlled Vital Signs Assessment: post-procedure vital signs reviewed and stable Respiratory status: spontaneous breathing, nonlabored ventilation and respiratory function stable Cardiovascular status: blood pressure returned to baseline and stable Postop Assessment: no apparent nausea or vomiting Anesthetic complications: no    Last Vitals:  Vitals:   03/26/17 0915 03/26/17 0947  BP:  (!) 118/59  Pulse: (!) 57 (!) 59  Resp: 17 16  Temp:  36.6 C  SpO2: 95% 94%    Last Pain:  Vitals:   03/26/17 0947  TempSrc:   PainSc: 0-No pain                 Catalina Gravel

## 2017-03-26 NOTE — Anesthesia Procedure Notes (Signed)
Procedure Name: LMA Insertion Date/Time: 03/26/2017 7:32 AM Performed by: Justice Rocher, CRNA Pre-anesthesia Checklist: Patient identified, Emergency Drugs available, Suction available and Patient being monitored Patient Re-evaluated:Patient Re-evaluated prior to induction Oxygen Delivery Method: Circle system utilized Preoxygenation: Pre-oxygenation with 100% oxygen Induction Type: IV induction Ventilation: Mask ventilation without difficulty LMA: LMA inserted LMA Size: 4.0 Number of attempts: 1 Airway Equipment and Method: Bite block Placement Confirmation: positive ETCO2 Tube secured with: Tape Dental Injury: Teeth and Oropharynx as per pre-operative assessment

## 2017-03-26 NOTE — Discharge Instructions (Signed)
° °  D & C Home care Instructions: ° ° °Personal hygiene:  Used sanitary napkins for vaginal drainage not tampons. Shower or tub bathe the day after your procedure. No douching until bleeding stops. Always wipe from front to back after  Elimination. ° °Activity: Do not drive or operate any equipment today. The effects of the anesthesia are still present and drowsiness may result. Rest today, not necessarily flat bed rest, just take it easy. You may resume your normal activity in one to 2 days. ° °Sexual activity: No intercourse for one week or as indicated by your physician ° °Diet: Eat a light diet as desired this evening. You may resume a regular diet tomorrow. ° °Return to work: One to 2 days. ° °General Expectations of your surgery: Vaginal bleeding should be no heavier than a normal period. Spotting may continue up to 10 days. Mild cramps may continue for a couple of days. You may have a regular period in 2-6 weeks. ° °Unexpected observations call your doctor if these occur: persistent or heavy bleeding. Severe abdominal cramping or pain. Elevation of temperature greater than 100°F. ° °Call for an appointment in one week. ° ° ° °Patient's Signature_______________________________________________________ ° °Nurse's Signature________________________________________________________ ° ° °Post Anesthesia Home Care Instructions ° °Activity: °Get plenty of rest for the remainder of the day. A responsible individual must stay with you for 24 hours following the procedure.  °For the next 24 hours, DO NOT: °-Drive a car °-Operate machinery °-Drink alcoholic beverages °-Take any medication unless instructed by your physician °-Make any legal decisions or sign important papers. ° °Meals: °Start with liquid foods such as gelatin or soup. Progress to regular foods as tolerated. Avoid greasy, spicy, heavy foods. If nausea and/or vomiting occur, drink only clear liquids until the nausea and/or vomiting subsides. Call your  physician if vomiting continues. ° °Special Instructions/Symptoms: °Your throat may feel dry or sore from the anesthesia or the breathing tube placed in your throat during surgery. If this causes discomfort, gargle with warm salt water. The discomfort should disappear within 24 hours. ° °If you had a scopolamine patch placed behind your ear for the management of post- operative nausea and/or vomiting: ° °1. The medication in the patch is effective for 72 hours, after which it should be removed.  Wrap patch in a tissue and discard in the trash. Wash hands thoroughly with soap and water. °2. You may remove the patch earlier than 72 hours if you experience unpleasant side effects which may include dry mouth, dizziness or visual disturbances. °3. Avoid touching the patch. Wash your hands with soap and water after contact with the patch. °  ° °

## 2017-03-26 NOTE — Transfer of Care (Signed)
Immediate Anesthesia Transfer of Care Note  Patient: Shannon Navarro  Procedure(s) Performed: Procedure(s) (LRB): DILATATION & CURETTAGE/HYSTEROSCOPY WITH MYOSURE (N/A)  Patient Location: PACU  Anesthesia Type: General  Level of Consciousness: awake, sedated, patient cooperative and responds to stimulation  Airway & Oxygen Therapy: Patient Spontanous Breathing and Patient connected to [place] oxygen  Post-op Assessment: Report given to PACU RN, Post -op Vital signs reviewed and stable and Patient moving all extremities  Post vital signs: Reviewed and stable  Complications: No apparent anesthesia complications

## 2017-03-26 NOTE — Progress Notes (Signed)
Patient ID: Shannon Navarro, female   DOB: 1955-09-03, 61 y.o.   MRN: [id number] Per pt no changes in dictated H&P.  Brief exam WNL.  Off Xarelto x 2 days.

## 2017-03-26 NOTE — Op Note (Signed)
Operative Note    Preoperative Diagnosis Endometrial thickening and possible polyps on [place]  Postoperative Diagnosis same  Procedure Hysteroscopy with polypectomy and endometrial sampling  Surgeon Paula Compton, MD  Anesthesia LMA  Fluids: EBL 67mL UOP 69mL IVF 1237mL  Findings Uterine cavity with 2 small polyps and generalized proliferative appearing lining.  Specimen Polyps and endometrial currettings  Procedure Note Patient was taken to the operating room where LMA anesthesia was obtained without difficulty. She was then prepped and draped in the normal sterile fashion in the dorsal lithotomy position. An appropriate time out was performed. A speculum was then placed within the vagina and the anterior lip of the cervix identified and injected with approximately 2 cc of 1% plain lidocaine. An additional 9 cc each was placed at 2 and 10:00 for a paracervical block. Uterus was then sounded to approximately 8-9 cm and the [facility] dilators utilized to dilate the cervix up to approximately 21. The Myosure operating scope was then introduced into the cervix and the cavity inspected with findings as previously noted. The Myosure lite operating blade was then introduced through the scope and under direct visualization the polyps were removed in entirety and the endometrium sampled in all quadrants. There was no active bleeding at the conclusion of the removal. The operating scope was then removed from the cervix a small curette inserted into the uterine fundus. A gentle curettage was performed in all 4 quadrants to sample any remaining endometrium. All tissue specimens were handed off to pathology. The tenaculum was then removed from the cervix and was hemostatic. Finally the speculum was removed from the vagina and the patient awakened and taken to the recovery room in good condition. All instrument and sponge counts were correct.

## 2017-03-27 ENCOUNTER — Encounter (HOSPITAL_BASED_OUTPATIENT_CLINIC_OR_DEPARTMENT_OTHER): Payer: Self-pay | Admitting: Obstetrics and Gynecology

## 2017-04-06 ENCOUNTER — Ambulatory Visit
Admission: RE | Admit: 2017-04-06 | Discharge: 2017-04-06 | Disposition: A | Discharge status: Home / Self Care | Payer: BC Managed Care – PPO | Source: Ambulatory Visit | Attending: Obstetrics and Gynecology | Admitting: Obstetrics and Gynecology

## 2017-04-06 DIAGNOSIS — Z1231 Encounter for screening mammogram for malignant neoplasm of breast: Secondary | ICD-10-CM

## 2017-04-25 ENCOUNTER — Encounter: Payer: Self-pay | Admitting: Family Medicine

## 2017-04-25 ENCOUNTER — Ambulatory Visit (INDEPENDENT_AMBULATORY_CARE_PROVIDER_SITE_OTHER): Payer: BC Managed Care – PPO | Admitting: Family Medicine

## 2017-04-25 VITALS — BP 130/80 | HR 52 | Temp 98.2°F | Resp 16 | Wt 273.0 lb

## 2017-04-25 DIAGNOSIS — B9689 Other specified bacterial agents as the cause of diseases classified elsewhere: Secondary | ICD-10-CM

## 2017-04-25 DIAGNOSIS — J301 Allergic rhinitis due to pollen: Secondary | ICD-10-CM | POA: Diagnosis not present

## 2017-04-25 DIAGNOSIS — J329 Chronic sinusitis, unspecified: Secondary | ICD-10-CM

## 2017-04-25 MED ORDER — DOXYCYCLINE HYCLATE 100 MG PO TABS: 100.0000 mg | ORAL_TABLET | Freq: Two times a day (BID) | ORAL | 0 refills | Status: AC

## 2017-04-25 NOTE — Patient Instructions (Addendum)
Sinsusitis Bacterial based on: Symptoms >10 days  Treatment: -considered steroid: we opted out for now- but could always consider this later if not improving -other symptomatic care with mucinex -Antibiotic indicated: yes  For allergic rhinitis/seasonal allergies- does appear this is contributing and I advised to use flonase regularly for at least next 2 weeks  Also if area behind ear (lymph node) doesn't go back down within a month- return to see [place]  Finally, we reviewed reasons to return to care including if symptoms worsen or persist or new concerns arise (particularly fever or shortness of breath)  Meds ordered this encounter  Medications  . doxycycline (VIBRA-TABS) 100 MG tablet    Sig: Take 1 tablet (100 mg total) by mouth 2 (two) times daily for 7 days.    Dispense:  14 tablet    Refill:  0

## 2017-04-25 NOTE — Progress Notes (Signed)
PCP: [name], MD  Subjective:  Shannon Navarro is a 61 y.o. year old very pleasant female patient who presents with sinusitis symptoms including nasal congestion with yellow discharge,  sinus tenderness particularly frontal sinuses -other symptoms include: watery itchy eyes, lymph node near right ear enlarged -day of illness:2 months- was trying to just fight this off -Symptoms show no change -previous treatments: zyrtec and sparing flonase -sick contacts/travel/risks: denies flu exposure.  -Hx of: allergies  ROS-denies fever, SOB, NVD, tooth pain  Pertinent Past Medical History-  Patient Active Problem List   Diagnosis Date Noted  . PAF (paroxysmal atrial fibrillation) ([place]) 03/05/2017  . Viral gastroenteritis 12/23/2016  . Sinus bradycardia 04/11/2015  . Routine general medical examination at a health care facility 06/15/2014  . Bloating 06/15/2014  . Vitamin D deficiency   . OSA (obstructive sleep apnea) 01/16/2012  . Essential hypertension 06/21/2010  . Impaired fasting glucose 12/10/2009  . Anxiety state 10/05/2009  . GERD 10/05/2009  . Morbid obesity ([place]) 10/03/2008  . Allergic rhinitis 10/03/2008    Medications- reviewed  Current Outpatient Medications  Medication Sig Dispense Refill  . amLODipine (NORVASC) 5 MG tablet Take 1 tablet (5 mg total) by mouth daily. 90 tablet 1  . cetirizine (ZYRTEC) 10 MG tablet Take 10 mg daily by mouth.    . Cholecalciferol (VITAMIN D3) 5000 UNITS TABS Take 5,000 Units by mouth daily. (Patient taking differently: Take 5,000 Units as needed by mouth. ) 30 tablet   . fluticasone (FLONASE) 50 MCG/ACT nasal spray Place 1 spray into both nostrils as needed. 0.05 g 3  . losartan (COZAAR) 100 MG tablet Take 1 tablet (100 mg total) by mouth daily. 90 tablet 1  . propranolol (INDERAL) 20 MG tablet Take 1 tablet (20 mg total) by mouth 3 (three) times daily as needed. For Heartrate greater 120 only 30 tablet 3  . rivaroxaban  (XARELTO) 20 MG TABS tablet Take 1 tablet (20 mg total) by mouth daily with supper. 30 tablet 1  . venlafaxine XR (EFFEXOR-XR) 75 MG 24 hr capsule Take 1 capsule (75 mg total) by mouth daily. (Patient taking differently: Take 75 mg at bedtime by mouth. ) 90 capsule 1   No current facility-administered medications for this visit.     Objective: BP 130/80   Pulse (!) 52   Temp 98.2 F (36.8 C) (Oral)   Resp 16   Wt 273 lb (123.8 kg)   SpO2 98%   BMI 44.06 kg/m  Gen: NAD, resting comfortably HEENT: Turbinates erythematous with yellow drainage, TM normal, pharynx mildly erythematous with no tonsilar exudate or edema, frontal bilateral sinus tenderness CV: RRR no murmurs rubs or gallops Lungs: CTAB no crackles, wheeze, rhonchi Abdomen: soft/nontender/nondistended/normal bowel sounds. No rebound or guarding.  Ext: no edema Skin: warm, dry, no rash Neuro: grossly normal, moves all extremities  Assessment/Plan:  Sinsusitis Bacterial based on: Symptoms >10 days  Treatment: -considered steroid: we opted out for now- but could always consider this later if not improving -other symptomatic care with mucinex -Antibiotic indicated: yes  For allergic rhinitis/seasonal allergies- does appear this is contributing and I advised to use flonase regularly for at least next 2 weeks. Continue zyrtec.   Also if area behind ear (lymph node) doesn't go back down within a month- return to see [place]  Finally, we reviewed reasons to return to care including if symptoms worsen or persist or new concerns arise (particularly fever or shortness of breath)    Meds  ordered this encounter  Medications  . doxycycline (VIBRA-TABS) 100 MG tablet    Sig: Take 1 tablet (100 mg total) by mouth 2 (two) times daily for 7 days.    Dispense:  14 tablet    Refill:  0    Garret Reddish, MD

## 2017-04-27 ENCOUNTER — Other Ambulatory Visit: Payer: Self-pay | Admitting: Cardiology

## 2017-05-01 ENCOUNTER — Encounter: Payer: Self-pay | Admitting: Internal Medicine

## 2017-06-09 NOTE — Progress Notes (Signed)
lov cardiology dr hochrein 03-05-17 epic ekg 03-05-17 epic Echo 03-11-17 epic

## 2017-06-09 NOTE — Patient Instructions (Addendum)
Shannon Navarro  06/09/2017      Your procedure is scheduled on Thursday 06-18-17  Report to [facility].  Call this number if you have problems the morning of surgery:[phone]             OUR ADDRESS IS [address] , WE ARE LOCATED IN [place].                   Bring cpap mask , machine and tubing   Remember:  Do not eat food or drink liquids after midnight.  Take these medicines the morning of surgery with A SIP OF WATER CERTRIZINE (ZERTEC), LOSARTAN (COZAAR), PROPRANOLOL (INDERAL) IF NEEDED, AMLODIPINE (NORVASC), FLONASE NASAL SPRAY IF NEEDED   Do not wear jewelry, make-up or nail polish.  Do not wear lotions, powders, or perfumes, or deoderant.  Do not shave 48 hours prior to surgery.  Men may shave face and neck.  Do not bring valuables to the hospital.  [facility] is not responsible for any belongings or valuables.  Contacts, dentures or bridgework may not be worn into surgery.  Leave your suitcase in the car.  After surgery it may be brought to your room.  For patients admitted to the hospital, discharge time will be determined by your treatment team.   Special instructions:   Please read over the following fact sheets that you were given.    [place] - Preparing for Surgery Before surgery, you can play an important role.  Because skin is not sterile, your skin needs to be as free of germs as possible.  You can reduce the number of germs on your skin by washing with CHG (chlorahexidine gluconate) soap before surgery.  CHG is an antiseptic cleaner which kills germs and bonds with the skin to continue killing germs even after washing. Please DO NOT use if you have an allergy to CHG or antibacterial soaps.  If your skin becomes reddened/irritated stop using the CHG and inform your nurse when you arrive at Short Stay. Do not shave (including legs and underarms) for at least 48 hours prior  to the first CHG shower.  You may shave your face/neck. Please follow these instructions carefully:  1.  Shower with CHG Soap the night before surgery and the  morning of Surgery.  2.  If you choose to wash your hair, wash your hair first as usual with your  normal  shampoo.  3.  After you shampoo, rinse your hair and body thoroughly to remove the  shampoo.                           4.  Use CHG as you would any other liquid soap.  You can apply chg directly  to the skin and wash                       Gently with a scrungie or clean washcloth.  5.  Apply the CHG Soap to your body ONLY FROM THE NECK DOWN.   Do not use on face/ open                           Wound  or open sores. Avoid contact with eyes, ears mouth and genitals (private parts).                       Wash face,  Genitals (private parts) with your normal soap.             6.  Wash thoroughly, paying special attention to the area where your surgery  will be performed.  7.  Thoroughly rinse your body with warm water from the neck down.  8.  DO NOT shower/wash with your normal soap after using and rinsing off  the CHG Soap.                9.  Pat yourself dry with a clean towel.            10.  Wear clean pajamas.            11.  Place clean sheets on your bed the night of your first shower and do not  sleep with pets. Day of Surgery : Do not apply any lotions/deodorants the morning of surgery.  Please wear clean clothes to the hospital/surgery center.  FAILURE TO FOLLOW THESE INSTRUCTIONS MAY RESULT IN THE CANCELLATION OF YOUR SURGERY PATIENT SIGNATURE_________________________________  NURSE SIGNATURE__________________________________  ________________________________________________________________________  WHAT IS A BLOOD TRANSFUSION? Blood Transfusion Information  A transfusion is the replacement of blood or some of its parts. Blood is made up of multiple cells which provide different functions.  Red blood cells carry oxygen  and are used for blood loss replacement.  White blood cells fight against infection.  Platelets control bleeding.  Plasma helps clot blood.  Other blood products are available for specialized needs, such as hemophilia or other clotting disorders. BEFORE THE TRANSFUSION  Who gives blood for transfusions?   Healthy volunteers who are fully evaluated to make sure their blood is safe. This is blood bank blood. Transfusion therapy is the safest it has ever been in the practice of medicine. Before blood is taken from a donor, a complete history is taken to make sure that person has no history of diseases nor engages in risky social behavior (examples are intravenous drug use or sexual activity with multiple partners). The donor's travel history is screened to minimize risk of transmitting infections, such as malaria. The donated blood is tested for signs of infectious diseases, such as HIV and hepatitis. The blood is then tested to be sure it is compatible with you in order to minimize the chance of a transfusion reaction. If you or a relative donates blood, this is often done in anticipation of surgery and is not appropriate for emergency situations. It takes many days to process the donated blood. RISKS AND COMPLICATIONS Although transfusion therapy is very safe and saves many lives, the main dangers of transfusion include:   Getting an infectious disease.  Developing a transfusion reaction. This is an allergic reaction to something in the blood you were given. Every precaution is taken to prevent this. The decision to have a blood transfusion has been considered carefully by your caregiver before blood is given. Blood is not given unless the benefits outweigh the risks. AFTER THE TRANSFUSION  Right after receiving a blood transfusion, you will usually feel much better and more energetic. This is especially true if your red blood cells have gotten low (anemic). The transfusion raises the level of  the red blood cells which carry oxygen, and this usually causes an energy increase.  The  nurse administering the transfusion will monitor you carefully for complications. HOME CARE INSTRUCTIONS  No special instructions are needed after a transfusion. You may find your energy is better. Speak with your caregiver about any limitations on activity for underlying diseases you may have. SEEK MEDICAL CARE IF:   Your condition is not improving after your transfusion.  You develop redness or irritation at the intravenous (IV) site. SEEK IMMEDIATE MEDICAL CARE IF:  Any of the following symptoms occur over the next 12 hours:  Shaking chills.  You have a temperature by mouth above 102 F (38.9 C), not controlled by medicine.  Chest, back, or muscle pain.  People around you feel you are not acting correctly or are confused.  Shortness of breath or difficulty breathing.  Dizziness and fainting.  You get a rash or develop hives.  You have a decrease in urine output.  Your urine turns a dark color or changes to pink, red, or brown. Any of the following symptoms occur over the next 10 days:  You have a temperature by mouth above 102 F (38.9 C), not controlled by medicine.  Shortness of breath.  Weakness after normal activity.  The white part of the eye turns yellow (jaundice).  You have a decrease in the amount of urine or are urinating less often.  Your urine turns a dark color or changes to pink, red, or brown. Document Released: 04/25/2000 Document Revised: 07/21/2011 Document Reviewed: 12/13/2007 [facility] Patient Information 2014 [place], [place].  _______________________________________________________________________

## 2017-06-11 ENCOUNTER — Encounter (HOSPITAL_COMMUNITY): Payer: Self-pay

## 2017-06-11 ENCOUNTER — Other Ambulatory Visit: Payer: Self-pay

## 2017-06-11 ENCOUNTER — Encounter (HOSPITAL_COMMUNITY)
Admission: RE | Admit: 2017-06-11 | Discharge: 2017-06-11 | Disposition: A | Discharge status: Home / Self Care | Payer: BC Managed Care – PPO | Source: Ambulatory Visit | Attending: Obstetrics and Gynecology | Admitting: Obstetrics and Gynecology

## 2017-06-11 DIAGNOSIS — Z01818 Encounter for other preprocedural examination: Secondary | ICD-10-CM | POA: Diagnosis present

## 2017-06-11 LAB — BASIC METABOLIC PANEL
ANION GAP: 3 — AB (ref 5–15)
BUN: 12 mg/dL (ref 6–20)
CHLORIDE: 110 mmol/L (ref 101–111)
CO2: 28 mmol/L (ref 22–32)
Calcium: 9 mg/dL (ref 8.9–10.3)
Creatinine, Ser: 0.73 mg/dL (ref 0.44–1.00)
GFR calc Af Amer: >60 mL/min (ref >60)
GLUCOSE: 126 mg/dL — AB (ref 65–99)
POTASSIUM: 4.4 mmol/L (ref 3.5–5.1)
Sodium: 141 mmol/L (ref 135–145)

## 2017-06-11 LAB — CBC
HCT: 41.9 % (ref 36.0–46.0)
HEMOGLOBIN: 13.9 g/dL (ref 12.0–15.0)
MCH: 28.4 pg (ref 26.0–34.0)
MCHC: 33.2 g/dL (ref 30.0–36.0)
MCV: 85.5 fL (ref 78.0–100.0)
Platelets: 260 10*3/uL (ref 150–400)
RBC: 4.9 MIL/uL (ref 3.87–5.11)
RDW: 13.5 % (ref 11.5–15.5)
WBC: 4.8 10*3/uL (ref 4.0–10.5)

## 2017-06-11 LAB — ABO/RH: ABO/RH(D): A NEG

## 2017-06-12 ENCOUNTER — Telehealth: Payer: Self-pay | Admitting: Cardiology

## 2017-06-12 NOTE — Telephone Encounter (Signed)
° °  [facility] Pre-operative Risk Assessment    Request for surgical clearance:  1. What type of surgery is being performed? hysterectomy and ovariectomy   2. When is this surgery scheduled? 06-18-17  3. What type of clearance is required (medical clearance vs. Pharmacy clearance to hold med vs. Both)? Pharmacy clearance to hold med   4. Are there any medications that need to be held prior to surgery and how long? How long to hold  Xarelto?  5. Practice name and name of physician performing surgery? [facility] // Dr. Everitt Amber   6. What is your office phone and fax number? Office # [phone]  Fax [phone]  7. Anesthesia type (None, local, MAC, general) ?   Marjean Donna 06/12/2017, 1:57 PM  _________________________________________________________________   (provider comments below)

## 2017-06-15 NOTE — Telephone Encounter (Signed)
     Primary Cardiologist: No primary care provider on file.  Chart reviewed as part of pre-operative protocol coverage. Patient was contacted 06/15/2017 in reference to pre-operative risk assessment for pending surgery as outlined below.  Shannon Navarro was last seen on 03/05/2017 by Dr. [name].  Since that day, Shannon Navarro has done well. She has felt no episodes of atrial fib and has taken no prn propranolol. She has no chest discomfort or dyspnea.   Therefore, based on ACC/AHA guidelines, the patient would be at acceptable risk for the planned procedure without further cardiovascular testing.   She has been instructed to hold the Xarelto for 2 days prior to her procedure. She should restart when cleared by her surgeon after bleeding is controlled.   I will route this recommendation to the requesting party via Epic fax function and remove from pre-op pool.  Please call with questions.  Daune Perch, NP 06/15/2017, 2:14 PM

## 2017-06-17 NOTE — H&P (Signed)
Shannon Navarro is an 62 y.o. female G2P2 for a sscheduled TLH/BSO for issues with recurrent endometrial hyperplasia.  Pt had a h/o thickened endometrial lining years ago, and began to feel bloated, but had no bleeding.  An office [place] 9/18 showed her endometrium to be 1 cm thick so subsequent endometrial biopsy was done and benign.  Because the  [place] appeared to demonstrate 2 polyps, she underwent a hysterosscopy/ polypectomy in 11/18 and was found to have simple and complex hyperplasia without atypia.    Her mother currently has metastatic endometrial cancer.  Given the recurrence of hyperplasia with no bleeding and benign EMB despite hyperplasia on more extensive sampling, she would like definitive hysterectomy which is reasonable and indicated.  She does not want to retain her ovaries. Recent pap WNL.     Pertinent Gynecological History: OB History:  NSVD x 2   Menstrual History:  No LMP recorded. Patient is postmenopausal.    Past Medical History:  Diagnosis Date  . Allergic rhinitis   . Anticoagulant long-term use    XARELTO  . ANXIETY   . Endometrial polyp   . Endometrial polyp   . Fatty liver 06/23/07  . GERD   . Hiatal hernia   . History of esophageal stricture    mainly due to gerd  . History of exercise stress test 06/13/2015   PER DR HOCHREIN NOTE -- NO EVIDENCE ISCHEMIA  . HYPERTENSION   . Impaired fasting glucose   . Migraine    hx of migraines none recent  . OSA on CPAP followed by dr Halford Chessman   per study 02-09-2012  severe osa w/ AHI 33.8  . Paroxysmal atrial fibrillation [facility]) cardioloigst-  dr hochrein   dx 02-21-2016 at ED  . Pseudotumor cerebri 2007  . Vitamin D deficiency 07/2013    Past Surgical History:  Procedure Laterality Date  . COLONOSCOPY    . colonscopy  2007   normal  . DILATATION & CURETTAGE/HYSTEROSCOPY WITH MYOSURE N/A 03/26/2017   Procedure: DILATATION & CURETTAGE/HYSTEROSCOPY WITH MYOSURE;  Surgeon: Paula Compton, MD;  Location: [facility];  Service: Gynecology;  Laterality: N/A;  . DILATION AND CURETTAGE OF UTERUS  2010  . right knee arthroscopy  03/2013  . TRANSTHORACIC ECHOCARDIOGRAM  03/11/2016   ef 60-65%/  mild MR/ trivial PR and TR  . TUBAL LIGATION    . UPPER GASTROINTESTINAL ENDOSCOPY      Family History  Problem Relation Age of Onset  . Breast cancer Mother   . Asthma Mother   . Hypertension Mother   . Prostate cancer Father   . Aortic stenosis Father   . Hypertension Father   . Colon polyps Father   . Heart disease Father   . Diabetes Father   . Breast cancer Maternal Aunt   . Breast cancer Maternal Aunt   . Colon cancer Neg Hx   . Esophageal cancer Neg Hx   . Stomach cancer Neg Hx   . Rectal cancer Neg Hx     Social History:  reports that  has never smoked. she has never used smokeless tobacco. She reports that she does not drink alcohol or use drugs.  Allergies:  Allergies  Allergen Reactions  . Cefaclor Rash  . Codeine Nausea And Vomiting  . Hydrocodone Nausea And Vomiting    Severe vomiting/  COUGH SYRUP CAUSE SEVERE VOMITING  . Levaquin [Levofloxacin] Nausea Only  . Nexium [Esomeprazole Magnesium] Other (See Comments)    Made legs  ache  . Penicillins Rash  . Sulfa Antibiotics Rash    No medications prior to admission.    Review of Systems  Cardiovascular: Negative for chest pain.  Gastrointestinal: Negative for abdominal pain.  Neurological: Negative for headaches.    There were no vitals taken for this visit. Physical Exam  Constitutional: She appears well-developed.  Cardiovascular: Normal rate and regular rhythm.  Respiratory: Effort normal.  GI: Soft.  Genitourinary: Vagina normal.  Genitourinary Comments: Uterus retroverted, normal size Poor descensus  Neurological: She is alert.  Psychiatric: She has a normal mood and affect.    No results found for this or any previous visit (from the past 24 hour(s)).  No results found.  Assessment/Plan: Pt  was counseled on the risks and benfits of TLH/BSO including bleeding, infection, and possible damage to bowel and bladder or ureters. She would accept a transfusion if needed. We reviewed the procedure in detail including laparoscopic vs vaginal closure of cuff. We discussed a possible incision in the event of any complication and possible delayed recovery from that. She is ready to proceed.  Logan Bores 06/17/2017, 4:28 PM

## 2017-06-17 NOTE — Anesthesia Preprocedure Evaluation (Addendum)
Anesthesia Evaluation  Patient identified by MRN, date of birth, ID band Patient awake    Reviewed: Allergy & Precautions, NPO status , Patient's Chart, lab work & pertinent test results  Airway Mallampati: II  TM Distance: >3 FB Neck ROM: Full    Dental  (+) Teeth Intact, Dental Advisory Given   Pulmonary sleep apnea and Continuous Positive Airway Pressure Ventilation ,    Pulmonary exam normal breath sounds clear to auscultation       Cardiovascular hypertension, Pt. on medications and Pt. on home beta blockers Normal cardiovascular exam+ dysrhythmias (PAF)  Rhythm:Regular Rate:Normal     Neuro/Psych  Headaches, PSYCHIATRIC DISORDERS Anxiety Pseudotumor cerebri    GI/Hepatic Neg liver ROS, hiatal hernia, GERD  Medicated,  Endo/Other  Morbid obesity  Renal/GU negative Renal ROS     Musculoskeletal negative musculoskeletal ROS (+)   Abdominal   Peds  Hematology  (+) Blood dyscrasia (Xarelto), ,   Anesthesia Other Findings Day of surgery medications reviewed with the patient.  Reproductive/Obstetrics Polyp of corpus uteri                             Lab Results  Component Value Date   WBC 4.8 06/11/2017   HGB 13.9 06/11/2017   HCT 41.9 06/11/2017   MCV 85.5 06/11/2017   PLT 260 06/11/2017   Lab Results  Component Value Date   CREATININE 0.73 06/11/2017   BUN 12 06/11/2017   NA 141 06/11/2017   K 4.4 06/11/2017   CL 110 06/11/2017   CO2 28 06/11/2017    Anesthesia Physical  Anesthesia Plan  ASA: III  Anesthesia Plan: General   Post-op Pain Management:    Induction: Intravenous  PONV Risk Score and Plan: 4 or greater and Dexamethasone, Ondansetron, Midazolam and Scopolamine patch - Pre-op  Airway Management Planned: Oral ETT  Additional Equipment:   Intra-op Plan:   Post-operative Plan: Extubation in OR  Informed Consent: I have reviewed the patients History and  Physical, chart, labs and discussed the procedure including the risks, benefits and alternatives for the proposed anesthesia with the patient or authorized representative who has indicated his/her understanding and acceptance.   Dental advisory given  Plan Discussed with: CRNA  Anesthesia Plan Comments: (Risks/benefits of general anesthesia discussed with patient including risk of damage to teeth, lips, gum, and tongue, nausea/vomiting, allergic reactions to medications, and the possibility of heart attack, stroke and death.  All patient questions answered.  Patient wishes to proceed.)       Anesthesia Quick Evaluation

## 2017-06-18 ENCOUNTER — Encounter (HOSPITAL_BASED_OUTPATIENT_CLINIC_OR_DEPARTMENT_OTHER)
Admission: RE | Disposition: A | Discharge status: Home / Self Care | Payer: Self-pay | Source: Ambulatory Visit | Attending: Obstetrics and Gynecology

## 2017-06-18 ENCOUNTER — Ambulatory Visit (HOSPITAL_BASED_OUTPATIENT_CLINIC_OR_DEPARTMENT_OTHER)
Admission: RE | Admit: 2017-06-18 | Discharge: 2017-06-19 | Disposition: A | Discharge status: Home / Self Care | Payer: BC Managed Care – PPO | Source: Ambulatory Visit | Attending: Obstetrics and Gynecology | Admitting: Obstetrics and Gynecology

## 2017-06-18 ENCOUNTER — Ambulatory Visit (HOSPITAL_BASED_OUTPATIENT_CLINIC_OR_DEPARTMENT_OTHER): Payer: BC Managed Care – PPO | Admitting: Anesthesiology

## 2017-06-18 ENCOUNTER — Encounter (HOSPITAL_BASED_OUTPATIENT_CLINIC_OR_DEPARTMENT_OTHER): Payer: Self-pay

## 2017-06-18 DIAGNOSIS — E559 Vitamin D deficiency, unspecified: Secondary | ICD-10-CM | POA: Diagnosis not present

## 2017-06-18 DIAGNOSIS — Z888 Allergy status to other drugs, medicaments and biological substances status: Secondary | ICD-10-CM | POA: Diagnosis not present

## 2017-06-18 DIAGNOSIS — J309 Allergic rhinitis, unspecified: Secondary | ICD-10-CM | POA: Diagnosis not present

## 2017-06-18 DIAGNOSIS — Z880 Allergy status to penicillin: Secondary | ICD-10-CM | POA: Insufficient documentation

## 2017-06-18 DIAGNOSIS — Z8669 Personal history of other diseases of the nervous system and sense organs: Secondary | ICD-10-CM | POA: Diagnosis not present

## 2017-06-18 DIAGNOSIS — I480 Paroxysmal atrial fibrillation: Secondary | ICD-10-CM | POA: Diagnosis not present

## 2017-06-18 DIAGNOSIS — F419 Anxiety disorder, unspecified: Secondary | ICD-10-CM | POA: Diagnosis not present

## 2017-06-18 DIAGNOSIS — K449 Diaphragmatic hernia without obstruction or gangrene: Secondary | ICD-10-CM | POA: Diagnosis not present

## 2017-06-18 DIAGNOSIS — Z803 Family history of malignant neoplasm of breast: Secondary | ICD-10-CM | POA: Diagnosis not present

## 2017-06-18 DIAGNOSIS — I10 Essential (primary) hypertension: Secondary | ICD-10-CM | POA: Diagnosis not present

## 2017-06-18 DIAGNOSIS — Z7901 Long term (current) use of anticoagulants: Secondary | ICD-10-CM | POA: Insufficient documentation

## 2017-06-18 DIAGNOSIS — K219 Gastro-esophageal reflux disease without esophagitis: Secondary | ICD-10-CM | POA: Diagnosis not present

## 2017-06-18 DIAGNOSIS — Z885 Allergy status to narcotic agent status: Secondary | ICD-10-CM | POA: Insufficient documentation

## 2017-06-18 DIAGNOSIS — Z882 Allergy status to sulfonamides status: Secondary | ICD-10-CM | POA: Diagnosis not present

## 2017-06-18 DIAGNOSIS — G4733 Obstructive sleep apnea (adult) (pediatric): Secondary | ICD-10-CM | POA: Diagnosis not present

## 2017-06-18 DIAGNOSIS — N888 Other specified noninflammatory disorders of cervix uteri: Secondary | ICD-10-CM | POA: Diagnosis not present

## 2017-06-18 DIAGNOSIS — K760 Fatty (change of) liver, not elsewhere classified: Secondary | ICD-10-CM | POA: Insufficient documentation

## 2017-06-18 DIAGNOSIS — Z881 Allergy status to other antibiotic agents status: Secondary | ICD-10-CM | POA: Insufficient documentation

## 2017-06-18 DIAGNOSIS — Z8249 Family history of ischemic heart disease and other diseases of the circulatory system: Secondary | ICD-10-CM | POA: Diagnosis not present

## 2017-06-18 DIAGNOSIS — Z79899 Other long term (current) drug therapy: Secondary | ICD-10-CM | POA: Diagnosis not present

## 2017-06-18 DIAGNOSIS — Z8371 Family history of colonic polyps: Secondary | ICD-10-CM | POA: Diagnosis not present

## 2017-06-18 DIAGNOSIS — N8501 Benign endometrial hyperplasia: Secondary | ICD-10-CM

## 2017-06-18 DIAGNOSIS — Z8049 Family history of malignant neoplasm of other genital organs: Secondary | ICD-10-CM | POA: Diagnosis not present

## 2017-06-18 DIAGNOSIS — Z833 Family history of diabetes mellitus: Secondary | ICD-10-CM | POA: Insufficient documentation

## 2017-06-18 DIAGNOSIS — Z8042 Family history of malignant neoplasm of prostate: Secondary | ICD-10-CM | POA: Insufficient documentation

## 2017-06-18 DIAGNOSIS — Z6841 Body Mass Index (BMI) 40.0 and over, adult: Secondary | ICD-10-CM | POA: Insufficient documentation

## 2017-06-18 DIAGNOSIS — Z90710 Acquired absence of both cervix and uterus: Secondary | ICD-10-CM | POA: Diagnosis present

## 2017-06-18 HISTORY — PX: LAPAROSCOPIC HYSTERECTOMY: SHX1926

## 2017-06-18 LAB — TYPE AND SCREEN
ABO/RH(D): A NEG
ANTIBODY SCREEN: NEGATIVE

## 2017-06-18 SURGERY — HYSTERECTOMY, TOTAL, LAPAROSCOPIC
Anesthesia: General | Laterality: Bilateral

## 2017-06-18 MED ORDER — FENTANYL CITRATE (PF) 250 MCG/5ML IJ SOLN
INTRAMUSCULAR | Status: AC
  Filled 2017-06-18: qty 5

## 2017-06-18 MED ORDER — DEXAMETHASONE SODIUM PHOSPHATE 10 MG/ML IJ SOLN
INTRAMUSCULAR | Status: AC
  Filled 2017-06-18: qty 1

## 2017-06-18 MED ORDER — HYDROMORPHONE HCL 1 MG/ML IJ SOLN
0.2500 mg | INTRAMUSCULAR | Status: DC | PRN
  Administered 2017-06-18 (×4): 0.25 mg via INTRAVENOUS
  Filled 2017-06-18: qty 0.5

## 2017-06-18 MED ORDER — SIMETHICONE 80 MG PO CHEW
CHEWABLE_TABLET | ORAL | Status: AC
  Filled 2017-06-18: qty 1

## 2017-06-18 MED ORDER — LIDOCAINE HCL (CARDIAC) 20 MG/ML IV SOLN
INTRAVENOUS | Status: DC | PRN
  Administered 2017-06-18: 100 mg via INTRAVENOUS

## 2017-06-18 MED ORDER — SCOPOLAMINE 1 MG/3DAYS TD PT72
MEDICATED_PATCH | TRANSDERMAL | Status: AC
  Filled 2017-06-18: qty 1

## 2017-06-18 MED ORDER — FENTANYL CITRATE (PF) 100 MCG/2ML IJ SOLN
INTRAMUSCULAR | Status: DC | PRN
  Administered 2017-06-18 (×2): 100 ug via INTRAVENOUS
  Administered 2017-06-18 (×3): 50 ug via INTRAVENOUS

## 2017-06-18 MED ORDER — LIDOCAINE 2% (20 MG/ML) 5 ML SYRINGE
INTRAMUSCULAR | Status: AC
  Filled 2017-06-18: qty 5

## 2017-06-18 MED ORDER — OXYCODONE HCL 5 MG PO TABS
ORAL_TABLET | ORAL | Status: AC
  Filled 2017-06-18: qty 1

## 2017-06-18 MED ORDER — GLYCOPYRROLATE 0.2 MG/ML IJ SOLN
INTRAMUSCULAR | Status: DC | PRN
  Administered 2017-06-18: 0.1 mg via INTRAVENOUS

## 2017-06-18 MED ORDER — HYDROMORPHONE HCL 1 MG/ML IJ SOLN
0.2000 mg | INTRAMUSCULAR | Status: DC | PRN
  Filled 2017-06-18: qty 1

## 2017-06-18 MED ORDER — ONDANSETRON HCL 4 MG/2ML IJ SOLN
INTRAMUSCULAR | Status: DC | PRN
  Administered 2017-06-18: 4 mg via INTRAVENOUS

## 2017-06-18 MED ORDER — SCOPOLAMINE 1 MG/3DAYS TD PT72
1.0000 | MEDICATED_PATCH | TRANSDERMAL | Status: DC
  Administered 2017-06-18: 1.5 mg via TRANSDERMAL
  Filled 2017-06-18: qty 1

## 2017-06-18 MED ORDER — MENTHOL 3 MG MT LOZG
1.0000 | LOZENGE | OROMUCOSAL | Status: DC | PRN
  Administered 2017-06-18: 3 mg via ORAL
  Filled 2017-06-18: qty 9

## 2017-06-18 MED ORDER — ONDANSETRON HCL 4 MG/2ML IJ SOLN
INTRAMUSCULAR | Status: AC
  Filled 2017-06-18: qty 2

## 2017-06-18 MED ORDER — SUGAMMADEX SODIUM 200 MG/2ML IV SOLN
INTRAVENOUS | Status: DC | PRN
  Administered 2017-06-18: 252 mg via INTRAVENOUS

## 2017-06-18 MED ORDER — LACTATED RINGERS IV SOLN
INTRAVENOUS | Status: DC
  Filled 2017-06-18: qty 1000

## 2017-06-18 MED ORDER — EPHEDRINE 5 MG/ML INJ
INTRAVENOUS | Status: AC
  Filled 2017-06-18: qty 10

## 2017-06-18 MED ORDER — SIMETHICONE 80 MG PO CHEW
80.0000 mg | CHEWABLE_TABLET | Freq: Four times a day (QID) | ORAL | Status: DC | PRN
  Administered 2017-06-18: 80 mg via ORAL
  Filled 2017-06-18: qty 1

## 2017-06-18 MED ORDER — GENTAMICIN SULFATE 40 MG/ML IJ SOLN
INTRAVENOUS | Status: AC
  Administered 2017-06-18: 100 mL via INTRAVENOUS
  Filled 2017-06-18: qty 11

## 2017-06-18 MED ORDER — ONDANSETRON HCL 4 MG/2ML IJ SOLN
4.0000 mg | Freq: Once | INTRAMUSCULAR | Status: DC | PRN
  Filled 2017-06-18: qty 2

## 2017-06-18 MED ORDER — EPHEDRINE SULFATE 50 MG/ML IJ SOLN
INTRAMUSCULAR | Status: DC | PRN
  Administered 2017-06-18 (×2): 10 mg via INTRAVENOUS

## 2017-06-18 MED ORDER — MIDAZOLAM HCL 2 MG/2ML IJ SOLN
INTRAMUSCULAR | Status: AC
  Filled 2017-06-18: qty 2

## 2017-06-18 MED ORDER — DEXAMETHASONE SODIUM PHOSPHATE 4 MG/ML IJ SOLN
INTRAMUSCULAR | Status: DC | PRN
  Administered 2017-06-18: 10 mg via INTRAVENOUS

## 2017-06-18 MED ORDER — HYDROMORPHONE HCL 1 MG/ML IJ SOLN
INTRAMUSCULAR | Status: AC
  Filled 2017-06-18: qty 1

## 2017-06-18 MED ORDER — KETOROLAC TROMETHAMINE 30 MG/ML IJ SOLN
INTRAMUSCULAR | Status: AC
  Filled 2017-06-18: qty 1

## 2017-06-18 MED ORDER — PHENYLEPHRINE 40 MCG/ML (10ML) SYRINGE FOR IV PUSH (FOR BLOOD PRESSURE SUPPORT)
PREFILLED_SYRINGE | INTRAVENOUS | Status: AC
  Filled 2017-06-18: qty 10

## 2017-06-18 MED ORDER — IBUPROFEN 600 MG PO TABS
600.0000 mg | ORAL_TABLET | Freq: Four times a day (QID) | ORAL | Status: DC | PRN
  Administered 2017-06-18 – 2017-06-19 (×3): 600 mg via ORAL
  Filled 2017-06-18: qty 1

## 2017-06-18 MED ORDER — ROCURONIUM BROMIDE 10 MG/ML (PF) SYRINGE
PREFILLED_SYRINGE | INTRAVENOUS | Status: AC
  Filled 2017-06-18: qty 5

## 2017-06-18 MED ORDER — ROCURONIUM BROMIDE 100 MG/10ML IV SOLN
INTRAVENOUS | Status: DC | PRN
  Administered 2017-06-18: 70 mg via INTRAVENOUS
  Administered 2017-06-18: 10 mg via INTRAVENOUS

## 2017-06-18 MED ORDER — FENTANYL CITRATE (PF) 100 MCG/2ML IJ SOLN
INTRAMUSCULAR | Status: AC
  Filled 2017-06-18: qty 2

## 2017-06-18 MED ORDER — MENTHOL 3 MG MT LOZG
LOZENGE | OROMUCOSAL | Status: AC
  Filled 2017-06-18: qty 9

## 2017-06-18 MED ORDER — GENTAMICIN SULFATE 40 MG/ML IJ SOLN
INTRAVENOUS | Status: DC
  Filled 2017-06-18: qty 15.5

## 2017-06-18 MED ORDER — PHENYLEPHRINE HCL 10 MG/ML IJ SOLN
INTRAMUSCULAR | Status: DC | PRN
  Administered 2017-06-18 (×3): 40 ug via INTRAVENOUS

## 2017-06-18 MED ORDER — LACTATED RINGERS IV SOLN
INTRAVENOUS | Status: DC
  Administered 2017-06-18 (×2): via INTRAVENOUS
  Administered 2017-06-18: 1000 mL via INTRAVENOUS
  Filled 2017-06-18: qty 1000

## 2017-06-18 MED ORDER — SUGAMMADEX SODIUM 200 MG/2ML IV SOLN
INTRAVENOUS | Status: AC
  Filled 2017-06-18: qty 4

## 2017-06-18 MED ORDER — PROPOFOL 10 MG/ML IV BOLUS
INTRAVENOUS | Status: AC
  Filled 2017-06-18: qty 20

## 2017-06-18 MED ORDER — BUPIVACAINE HCL (PF) 0.25 % IJ SOLN
INTRAMUSCULAR | Status: DC | PRN
  Administered 2017-06-18: 24 mL

## 2017-06-18 MED ORDER — IBUPROFEN 200 MG PO TABS
ORAL_TABLET | ORAL | Status: AC
  Filled 2017-06-18: qty 3

## 2017-06-18 MED ORDER — ONDANSETRON HCL 4 MG PO TABS
4.0000 mg | ORAL_TABLET | Freq: Four times a day (QID) | ORAL | Status: DC | PRN
  Filled 2017-06-18: qty 1

## 2017-06-18 MED ORDER — VENLAFAXINE HCL ER 75 MG PO CP24
75.0000 mg | ORAL_CAPSULE | Freq: Every day | ORAL | Status: DC
  Administered 2017-06-18: 75 mg via ORAL
  Filled 2017-06-18 (×2): qty 1

## 2017-06-18 MED ORDER — ONDANSETRON HCL 4 MG/2ML IJ SOLN
4.0000 mg | Freq: Four times a day (QID) | INTRAMUSCULAR | Status: DC | PRN
  Filled 2017-06-18: qty 2

## 2017-06-18 MED ORDER — OXYCODONE HCL 5 MG PO TABS
5.0000 mg | ORAL_TABLET | ORAL | Status: DC | PRN
  Administered 2017-06-18 – 2017-06-19 (×4): 5 mg via ORAL
  Filled 2017-06-18: qty 1

## 2017-06-18 MED ORDER — PROPOFOL 10 MG/ML IV BOLUS
INTRAVENOUS | Status: DC | PRN
  Administered 2017-06-18: 200 mg via INTRAVENOUS

## 2017-06-18 SURGICAL SUPPLY — 57 items
BAG RETRIEVAL 10MM (BASKET)
BARRIER ADHS 3X4 INTERCEED (GAUZE/BANDAGES/DRESSINGS) ×3 IMPLANT
CABLE HIGH FREQUENCY MONO STRZ (ELECTRODE) IMPLANT
CATH ROBINSON RED A/P 16FR (CATHETERS) IMPLANT
COVER MAYO STAND STRL (DRAPES) ×3 IMPLANT
DERMABOND ADVANCED (GAUZE/BANDAGES/DRESSINGS) ×6
DERMABOND ADVANCED .7 DNX12 (GAUZE/BANDAGES/DRESSINGS) ×3 IMPLANT
DEVICE SUTURE ENDOST 10MM (ENDOMECHANICALS) ×3 IMPLANT
DRSG COVADERM PLUS 2X2 (GAUZE/BANDAGES/DRESSINGS) ×6 IMPLANT
DRSG OPSITE POSTOP 3X4 (GAUZE/BANDAGES/DRESSINGS) ×3 IMPLANT
DURAPREP 26ML APPLICATOR (WOUND CARE) ×3 IMPLANT
FILTER SMOKE EVAC LAPAROSHD (FILTER) ×3 IMPLANT
GLOVE BIO SURGEON STRL SZ 6.5 (GLOVE) ×6 IMPLANT
GLOVE BIO SURGEON STRL SZ7 (GLOVE) ×9 IMPLANT
GLOVE BIO SURGEONS STRL SZ 6.5 (GLOVE) ×3
GLOVE BIOGEL PI IND STRL 7.0 (GLOVE) ×2 IMPLANT
GLOVE BIOGEL PI INDICATOR 7.0 (GLOVE) ×4
GLOVE ORTHO TXT STRL SZ7.5 (GLOVE) ×3 IMPLANT
GOWN STRL REUS W/TWL LRG LVL3 (GOWN DISPOSABLE) ×9 IMPLANT
NEEDLE INSUFFLATION 120MM (ENDOMECHANICALS) ×3 IMPLANT
NS IRRIG 1000ML POUR BTL (IV SOLUTION) ×3 IMPLANT
NS IRRIG 500ML POUR BTL (IV SOLUTION) ×3 IMPLANT
OCCLUDER COLPOPNEUMO (BALLOONS) ×3 IMPLANT
PACK LAPAROSCOPY BASIN (CUSTOM PROCEDURE TRAY) ×3 IMPLANT
PACK TRENDGUARD 450 HYBRID PRO (MISCELLANEOUS) IMPLANT
PACK TRENDGUARD 600 HYBRD PROC (MISCELLANEOUS) ×1 IMPLANT
POUCH LAPAROSCOPIC INSTRUMENT (MISCELLANEOUS) ×3 IMPLANT
PROTECTOR NERVE ULNAR (MISCELLANEOUS) ×6 IMPLANT
SCISSORS LAP 5X35 DISP (ENDOMECHANICALS) IMPLANT
SET CYSTO W/LG BORE CLAMP LF (SET/KITS/TRAYS/PACK) IMPLANT
SET IRRIG TUBING LAPAROSCOPIC (IRRIGATION / IRRIGATOR) ×3 IMPLANT
SHEARS HARMONIC ACE PLUS 36CM (ENDOMECHANICALS) ×3 IMPLANT
SLEEVE XCEL OPT CAN 5 100 (ENDOMECHANICALS) ×3 IMPLANT
SUT ENDO VLOC 180-0-8IN (SUTURE) ×3 IMPLANT
SUT VIC AB 3-0 PS2 18 (SUTURE) ×2
SUT VIC AB 3-0 PS2 18XBRD (SUTURE) ×1 IMPLANT
SUT VICRYL 0 UR6 27IN ABS (SUTURE) ×3 IMPLANT
SYR 10ML LL (SYRINGE) ×3 IMPLANT
SYR 50ML LL SCALE MARK (SYRINGE) ×3 IMPLANT
SYS BAG RETRIEVAL 10MM (BASKET)
SYSTEM BAG RETRIEVAL 10MM (BASKET) IMPLANT
SYSTEM CARTER THOMASON II (TROCAR) ×3 IMPLANT
TIP UTERINE 5.1X6CM LAV DISP (MISCELLANEOUS) IMPLANT
TIP UTERINE 6.7X10CM GRN DISP (MISCELLANEOUS) IMPLANT
TIP UTERINE 6.7X6CM WHT DISP (MISCELLANEOUS) IMPLANT
TIP UTERINE 6.7X8CM BLUE DISP (MISCELLANEOUS) ×3 IMPLANT
TOWEL OR 17X24 6PK STRL BLUE (TOWEL DISPOSABLE) ×9 IMPLANT
TRAY FOLEY CATH SILVER 14FR (SET/KITS/TRAYS/PACK) ×3 IMPLANT
TRENDGUARD 450 HYBRID PRO PACK (MISCELLANEOUS)
TRENDGUARD 600 HYBRID PROC PK (MISCELLANEOUS) ×3
TROCAR 5M 150ML BLDLS (TROCAR) ×6 IMPLANT
TROCAR BALLN 12MMX100 BLUNT (TROCAR) ×3 IMPLANT
TROCAR BLADELESS OPT 5 100 (ENDOMECHANICALS) ×6 IMPLANT
TROCAR XCEL DIL TIP R 11M (ENDOMECHANICALS) ×3 IMPLANT
TROCAR XCEL NON-BLD 11X100MML (ENDOMECHANICALS) IMPLANT
TUBING INSUF HEATED (TUBING) ×3 IMPLANT
WARMER LAPAROSCOPE (MISCELLANEOUS) ×3 IMPLANT

## 2017-06-18 NOTE — Op Note (Signed)
Operative Note    Preoperative Diagnosis Simple and complex endometrial hyperlasia  Postoperative Diagnosis Same  Procedure Total laparoscopic hysterectomy and bilateral salpingoophorectomies  Surgeon Paula Compton, MD Carlynn Purl, DO  Anesthesia GETA  Fluids: EBL 179mL UOP 218mL clear IVF 2049mL LR  Findings Normal size uterus, ovaries and tubes WNL Appendix WNL, prominent cecum   Specimen Uterus, cervix, ovaries and tubes  Procedure Note Patient was taken to the operating room where general anesthesia was obtained without difficulty she was prepped and draped in the normal sterile fashion in the dorsal lithotomy position. An appropriate time out was performed. A Rumi with cervical cup was placed after dilation and stay sutures placed to hold the cup onto the cervix at 3 and 6 o'clock.  The vaginal occluder was filled and a Foley catheter placed in the bladder. Attention was then turned to the abdomen where of infraumbilical incision was made after injection with quarter percent Marcaine.  The varees needle was introduced into the abdomen with the skin tented up and pneumoperitoneum was obtained after intraperitonel placement confirmed with NS injection.  The 28mm optiview trocar was used to enter the peritoneal cavity with direct visual entry. The pelvis and abdomen were inspected with findings as previously stated. Two additional  ports were placed under direct visualization from the lateral upper quadrants. A 51mm port was placed on the right and an eleven mm port on the left.   Each port site was injected with quarter percent Marcaine prior placement.   With patient in Trendelenburg the uterus and tubes and ovaries were inspected with findings as previously stated. The Harmonic scalpel was then utilized to remove the distal fallopian tube fragment from her prior tubal ligation and these were removed through the 51mm port and handed off to path.  The infundibulopelvic  ligament was then taken down releasing the ovary form the sidewall.   The remainder of the broad ligament and the round ligament were then also taken down with the Harmonic to the level of the bladder flap.  The bladder flap was taken down from the lower uterine segment and pushed away to expose the cervix.  The uterine arteries were then taken down bilaterally with no difficulty and the vaginal cuff identified and opened over the vaginal ring.  The cuff was then completely opened over the ring until the uterus was completely free.  It was then pulled down into the vagina.  The vaginal cuff was then closed with a running v-lock suture with good closure.The cuff and pedicles were hemostatic and the ureters visualized and normal in appearance and peristalsing. A four quadrant view of the pelvis and abdomen was performed and found to be normal with no bleeding or injuries noted.  The instruments were removed from the abdomen as well as the lateral ports under visualization.  The pneumoperitoneum was reduced through the trocar. The trocar was finally removed and the infraumbilical incision and lateral incisions were closed.   The left lateral port was closed at the fascia with the Donnamarie Rossetti device with a suture of 0 vicryl   and with a subcuticular stitch of 3-0 Vicryl. The 25mm port was closed with a subcuticular stitch. Dermabond and a bandage were placed. Patient was then awakened and taken to the recovery room in good condition.

## 2017-06-18 NOTE — Discharge Summary (Signed)
Physician Discharge Summary  Patient ID: Shannon Navarro MRN: [id number] DOB/AGE: 01/01/1956 62 y.o.  Admit date: 06/18/2017 Discharge date: 06/19/2017  Admission Diagnoses:  Discharge Diagnoses:  Active Problems:   Complex endometrial hyperplasia   S/P laparoscopic hysterectomy   Discharged Condition: good  Hospital Course: Pt underwent a TLH/BSO on 06/18/17 for recurrent endometrial hyperplasia without atypia.  The surgery was uneventful and she was admitted for routine postoperative care.  On POD#1 the day of discharge, she was tolerating a regular diet, ambulating and taking her pain medications by mouth.  Consults: none  Significant Diagnostic Studies: labs: CBC,CMP  Treatments: none  Discharge Exam: Blood pressure (!) 113/52, pulse (!) 56, temperature 98.6 F (37 C), temperature source Oral, resp. rate 16, height 5\' 6"  (1.676 m), weight 126.1 kg (278 lb), SpO2 96 %. General appearance: alert and cooperative GI: soft NT  Incision/Wound:C/D/I  Disposition: 01-Home or Self Care  Discharge Instructions    Call MD for:  persistant nausea and vomiting   Complete by:  As directed    Call MD for:  redness, tenderness, or signs of infection (pain, swelling, redness, odor or green/yellow discharge around incision site)   Complete by:  As directed    Call MD for:  severe uncontrolled pain   Complete by:  As directed    Call MD for:  temperature >100.4   Complete by:  As directed    Diet - low sodium heart healthy   Complete by:  As directed    Discharge instructions   Complete by:  As directed    Avoid driving for at least 1-2 weeks or until off narcotic pain meds.  No heavy lifting greater than 10 lbs.  Nothing in vagina for 6 weeks.  May remove bandage in 1-2 days.  Shower over incision and pat dry.   Increase activity slowly   Complete by:  As directed      Allergies as of 06/19/2017      Reactions   Cefaclor Rash   Codeine Nausea And Vomiting   Hydrocodone Nausea And  Vomiting   Severe vomiting/  COUGH SYRUP CAUSE SEVERE VOMITING   Levaquin [levofloxacin] Nausea Only   Nexium [esomeprazole Magnesium] Other (See Comments)   Made legs ache   Penicillins Rash   Sulfa Antibiotics Rash      Medication List    TAKE these medications   amLODipine 5 MG tablet Commonly known as:  NORVASC Take 1 tablet (5 mg total) by mouth daily.   cetirizine 10 MG tablet Commonly known as:  ZYRTEC Take 10 mg daily by mouth.   fluticasone 50 MCG/ACT nasal spray Commonly known as:  FLONASE Place 1 spray into both nostrils as needed.   ibuprofen 600 MG tablet Commonly known as:  ADVIL,MOTRIN Take 1 tablet (600 mg total) by mouth every 6 (six) hours as needed (mild pain).   losartan 100 MG tablet Commonly known as:  COZAAR Take 1 tablet (100 mg total) by mouth daily.   oxyCODONE 5 MG immediate release tablet Commonly known as:  Oxy IR/ROXICODONE Take 1 tablet (5 mg total) by mouth every 4 (four) hours as needed for moderate pain.   propranolol 20 MG tablet Commonly known as:  INDERAL Take 1 tablet (20 mg total) by mouth 3 (three) times daily as needed. For Heartrate greater 120 only   ranitidine 300 MG tablet Commonly known as:  ZANTAC Take 300 mg by mouth at bedtime.   venlafaxine XR 75 MG 24 hr  capsule Commonly known as:  EFFEXOR-XR Take 1 capsule (75 mg total) by mouth daily. What changed:  when to take this   Vitamin D3 5000 units Tabs Take 5,000 Units by mouth daily. What changed:  when to take this   XARELTO 20 MG Tabs tablet Generic drug:  rivaroxaban TAKE 1 TABLET BY MOUTH ONCE DAILY WITH  SUPPER      Follow-up Information    Paula Compton, MD. Schedule an appointment as soon as possible for a visit in 2 week(s).   Specialty:  Obstetrics and Gynecology Why:  incision check Contact information: [address] [place] [address] [phone]           Signed: Logan Bores 06/19/2017, 8:46 AM

## 2017-06-18 NOTE — Anesthesia Procedure Notes (Signed)
Procedure Name: Intubation Date/Time: 06/18/2017 7:37 AM Performed by: Hewitt Blade, CRNA Pre-anesthesia Checklist: Patient identified, Emergency Drugs available, Suction available and Patient being monitored Patient Re-evaluated:Patient Re-evaluated prior to induction Oxygen Delivery Method: Circle system utilized Preoxygenation: Pre-oxygenation with 100% oxygen Induction Type: IV induction Ventilation: Two handed mask ventilation required and Oral airway inserted - appropriate to patient size Laryngoscope Size: Mac and 3 Grade View: Grade I Tube type: Oral Tube size: 7.0 mm Number of attempts: 1 Airway Equipment and Method: Stylet Placement Confirmation: ETT inserted through vocal cords under direct vision,  positive ETCO2 and breath sounds checked- equal and bilateral Secured at: 22 cm Tube secured with: Tape Dental Injury: Teeth and Oropharynx as per pre-operative assessment

## 2017-06-18 NOTE — Anesthesia Postprocedure Evaluation (Signed)
Anesthesia Post Note  Patient: Shannon Navarro  Procedure(s) Performed: HYSTERECTOMY TOTAL LAPAROSCOPIC (Bilateral ) LAPAROSCOPIC SALPINGOOPHORECTOMY (Bilateral )     Patient location during evaluation: PACU Anesthesia Type: General Level of consciousness: awake and alert Pain management: pain level controlled Vital Signs Assessment: post-procedure vital signs reviewed and stable Respiratory status: spontaneous breathing, nonlabored ventilation, respiratory function stable and patient connected to nasal cannula oxygen Cardiovascular status: blood pressure returned to baseline and stable Postop Assessment: no apparent nausea or vomiting Anesthetic complications: no    Last Vitals:  Vitals:   06/18/17 1115 06/18/17 1230  BP:  (!) 109/57  Pulse: 86 88  Resp: 12 16  Temp:  36.9 C  SpO2: 98% 97%    Last Pain:  Vitals:   06/18/17 1300  TempSrc:   PainSc: 6                  Tiajuana Amass

## 2017-06-18 NOTE — Progress Notes (Addendum)
Day of Surgery Procedure(s) (LRB): HYSTERECTOMY TOTAL LAPAROSCOPIC (Bilateral) LAPAROSCOPIC SALPINGOOPHORECTOMY (Bilateral)  Subjective: Patient reports tolerating PO.  Sore on lower left abdominal wall when lifts leg.  Objective: I have reviewed patient's vital signs, intake and output and medications.  Good UOP at 837mL clear  General: alert and cooperative GI: soft NT Incisions clear Vaginal Bleeding: minimal  Assessment: s/p Procedure(s) with comments: HYSTERECTOMY TOTAL LAPAROSCOPIC (Bilateral) - OUT PT IN BED, needs extra time MD request 2.5hrs in OR LAPAROSCOPIC SALPINGOOPHORECTOMY (Bilateral): progressing well  Plan: Encourage ambulation  D/c foley catheter Continue po meds and regular diet this PM. Probable d/c in AM   LOS: 0 days    Shannon Navarro 06/18/2017, 4:31 PM

## 2017-06-18 NOTE — Progress Notes (Signed)
Patient ID: Shannon Navarro, female   DOB: 1955/06/22, 62 y.o.   MRN: [id number] Per pt no changes in dictated H&P.  Cardiac clearance on chart.  Off xarelto for 4 days.  Brief exam WNL.  Ready to proceed.

## 2017-06-18 NOTE — Transfer of Care (Signed)
Immediate Anesthesia Transfer of Care Note  Patient: Shannon Navarro  Procedure(s) Performed: HYSTERECTOMY TOTAL LAPAROSCOPIC (Bilateral ) LAPAROSCOPIC SALPINGOOPHORECTOMY (Bilateral )  Patient Location: PACU  Anesthesia Type:General  Level of Consciousness: awake, alert  and oriented  Airway & Oxygen Therapy: Patient Spontanous Breathing and Patient connected to nasal cannula oxygen  Post-op Assessment: Report given to RN, Post -op Vital signs reviewed and stable and Patient moving all extremities  Post vital signs: Reviewed and stable  Last Vitals:  Vitals:   06/18/17 0533 06/18/17 1023  BP: (!) 152/78   Pulse: (!) 55 (P) 88  Resp: 18   Temp: 36.9 C (P) 37 C  SpO2: 100% (P) 94%    Last Pain:  Vitals:   06/18/17 0533  TempSrc: Oral      Patients Stated Pain Goal: 6 (50/03/70 4888)  Complications: No apparent anesthesia complications

## 2017-06-19 ENCOUNTER — Encounter (HOSPITAL_BASED_OUTPATIENT_CLINIC_OR_DEPARTMENT_OTHER): Payer: Self-pay | Admitting: Obstetrics and Gynecology

## 2017-06-19 ENCOUNTER — Telehealth: Payer: Self-pay | Admitting: *Deleted

## 2017-06-19 DIAGNOSIS — N8501 Benign endometrial hyperplasia: Secondary | ICD-10-CM | POA: Diagnosis not present

## 2017-06-19 LAB — CBC
HCT: 36.1 % (ref 36.0–46.0)
HEMOGLOBIN: 12 g/dL (ref 12.0–15.0)
MCH: 28.3 pg (ref 26.0–34.0)
MCHC: 33.2 g/dL (ref 30.0–36.0)
MCV: 85.1 fL (ref 78.0–100.0)
PLATELETS: 249 10*3/uL (ref 150–400)
RBC: 4.24 MIL/uL (ref 3.87–5.11)
RDW: 13.8 % (ref 11.5–15.5)
WBC: 11.2 10*3/uL — AB (ref 4.0–10.5)

## 2017-06-19 LAB — BASIC METABOLIC PANEL
Anion gap: 5 (ref 5–15)
BUN: 15 mg/dL (ref 6–20)
CO2: 28 mmol/L (ref 22–32)
Calcium: 8.9 mg/dL (ref 8.9–10.3)
Chloride: 106 mmol/L (ref 101–111)
Creatinine, Ser: 0.82 mg/dL (ref 0.44–1.00)
Glucose, Bld: 148 mg/dL — ABNORMAL HIGH (ref 65–99)
Potassium: 4.1 mmol/L (ref 3.5–5.1)
SODIUM: 139 mmol/L (ref 135–145)

## 2017-06-19 MED ORDER — IBUPROFEN 600 MG PO TABS: 600.0000 mg | ORAL_TABLET | Freq: Four times a day (QID) | ORAL | 0 refills | Status: DC | PRN

## 2017-06-19 MED ORDER — OXYCODONE HCL 5 MG PO TABS: 5.0000 mg | ORAL_TABLET | ORAL | 0 refills | Status: DC | PRN

## 2017-06-19 MED ORDER — OXYCODONE HCL 5 MG PO TABS
ORAL_TABLET | ORAL | Status: AC
  Filled 2017-06-19: qty 1

## 2017-06-19 MED ORDER — IBUPROFEN 200 MG PO TABS
ORAL_TABLET | ORAL | Status: AC
  Filled 2017-06-19: qty 3

## 2017-06-19 NOTE — Progress Notes (Signed)
1 Day Post-Op Procedure(s) (LRB): HYSTERECTOMY TOTAL LAPAROSCOPIC (Bilateral) LAPAROSCOPIC SALPINGOOPHORECTOMY (Bilateral)  Subjective: Patient reports tolerating PO and no problems voiding. She has no pain except on the lower left abdominal wall and lower pelvis with going from laying to standing.  No leg weakness, no flank pain.   Objective: I have reviewed patient's vital signs, intake and output and labs.  General: alert and cooperative GI: soft NT and incisions C/D/I Vaginal Bleeding: minimal  Assessment: s/p Procedure(s) with comments: HYSTERECTOMY TOTAL LAPAROSCOPIC (Bilateral) - OUT PT IN BED, needs extra time MD request 2.5hrs in OR LAPAROSCOPIC SALPINGOOPHORECTOMY (Bilateral): progressing well and tolerating diet  Plan: Discharge home  Pt states will use ibuprofen and tylenol. Will ask if can restart the xarelto with ibuprofen or wait until on tylenol only to restart it.  No runs of afib noted  in OR yesterday   LOS: 0 days    Logan Bores 06/19/2017, 8:38 AM

## 2017-06-19 NOTE — Telephone Encounter (Signed)
Pt was on TCM report admitted 06/18/16 for Complex endometrial hyperplasia. Pt underwent a TLH/BSO on 06/18/17 for recurrent endometrial hyperplasia without atypia.  The surgery was uneventful and she was admitted for routine postoperative care.  On POD#1 the day of discharge 06/19/17  she was tolerating a regular diet, ambulating and taking her pain medications by mouth. Pt will f/u w/gynecologist Dr. Marvel Plan in 2 weeks.Marland KitchenJohny Chess

## 2017-07-13 ENCOUNTER — Ambulatory Visit: Payer: Self-pay | Admitting: Podiatry

## 2017-07-21 ENCOUNTER — Ambulatory Visit (INDEPENDENT_AMBULATORY_CARE_PROVIDER_SITE_OTHER): Payer: BC Managed Care – PPO

## 2017-07-21 ENCOUNTER — Encounter: Payer: Self-pay | Admitting: Podiatry

## 2017-07-21 ENCOUNTER — Ambulatory Visit (INDEPENDENT_AMBULATORY_CARE_PROVIDER_SITE_OTHER): Payer: BC Managed Care – PPO | Admitting: Podiatry

## 2017-07-21 VITALS — BP 127/71 | HR 57 | Resp 16

## 2017-07-21 DIAGNOSIS — S92504A Nondisplaced unspecified fracture of right lesser toe(s), initial encounter for closed fracture: Secondary | ICD-10-CM

## 2017-07-21 NOTE — Progress Notes (Signed)
   Subjective:    Patient ID: Shannon Navarro, female    DOB: [birth year], 62 y.o.   MRN: [id number]  HPI: She presents today states that she has pain in the right foot around the fifth toe lateral side there is a 1-1/2 months ago she fell on hardwood floor resulting but the foot plantarflexed and beneath her.  She states that she has been icing it and it has helped some.  She states he has pain on top of the foot as well in between the fourth and fifth toes.  She states that her toenails are getting thicker and she wants to know if it is okay.   Review of Systems  All other systems reviewed and are negative.      Objective:   Physical Exam: Vital signs are stable she is alert and oriented x3.  Pulses are palpable.  Capillary fill time is immediate.  Normal neurologic sensorium is intact per [employer] monofilament.  Deep tendon reflexes are brisk and equal bilateral.  Muscle strength +5/5 dorsiflexors plantar flexors inverters everters all intrinsic musculature is intact.  Orthopedic evaluation demonstrates all joints distal to the ankle full range of motion.  Mild hallux valgus deformity right greater than left.  Hammertoe deformities are noted bilateral.  Distal clavi are noted secondary to the hammertoe deformities.  Toenails are mildly thickened due to the distal clavi.  There is no signs of onychomycosis.  She has pain and swelling on palpation of the fifth digit of the right foot.  Radiographs taken today do demonstrate a transverse fracture at the anatomical neck of the proximal phalanx fifth digit right foot.  There is bone callus formation and the bone appears to be healing.        Assessment & Plan:  Nondisplaced healing fracture fifth proximal phalanx right foot.  Plan: Discussed etiology pathology conservative versus surgical therapies with her.  I demonstrated how to wrap the toe with co-band and gave her a sample.  Discussed appropriate shoe gear stretching exercises ice  therapy and shoe gear modifications.  I recommended she follow-up with me should her hammertoe deformity bunion deformity worsen or should her fifth toe not going to heal uneventfully.  I told her to expect at least another 6 weeks before it started to calm down.

## 2017-08-06 ENCOUNTER — Other Ambulatory Visit: Payer: Self-pay | Admitting: Cardiology

## 2017-08-18 ENCOUNTER — Ambulatory Visit (INDEPENDENT_AMBULATORY_CARE_PROVIDER_SITE_OTHER): Payer: BC Managed Care – PPO | Admitting: Podiatry

## 2017-08-18 ENCOUNTER — Ambulatory Visit (INDEPENDENT_AMBULATORY_CARE_PROVIDER_SITE_OTHER): Payer: BC Managed Care – PPO

## 2017-08-18 ENCOUNTER — Encounter: Payer: Self-pay | Admitting: Podiatry

## 2017-08-18 DIAGNOSIS — S92504D Nondisplaced unspecified fracture of right lesser toe(s), subsequent encounter for fracture with routine healing: Secondary | ICD-10-CM

## 2017-08-18 NOTE — Progress Notes (Signed)
She presents today for follow-up of the fifth toe fracture.  She states that it does not hurt anymore but is still swollen and discolored to some degree.  Objective: Vital signs are stable she is alert and oriented x3 mild tenderness on palpation of the fifth digit of the right foot.  Radiographs taken today demonstrate completely healed facture of the fifth proximal phalanx right foot.  Assessment: Well-healed fracture toe fifth right.  Plan: 1 a follow-up with her i on an as-needed basis.

## 2017-09-04 ENCOUNTER — Encounter: Payer: Self-pay | Admitting: Internal Medicine

## 2017-09-04 ENCOUNTER — Other Ambulatory Visit (INDEPENDENT_AMBULATORY_CARE_PROVIDER_SITE_OTHER): Payer: BC Managed Care – PPO

## 2017-09-04 ENCOUNTER — Ambulatory Visit (INDEPENDENT_AMBULATORY_CARE_PROVIDER_SITE_OTHER): Payer: BC Managed Care – PPO | Admitting: Internal Medicine

## 2017-09-04 VITALS — BP 112/80 | HR 57 | Temp 98.1°F | Ht 66.0 in | Wt 272.0 lb

## 2017-09-04 DIAGNOSIS — Z0000 Encounter for general adult medical examination without abnormal findings: Secondary | ICD-10-CM

## 2017-09-04 DIAGNOSIS — E559 Vitamin D deficiency, unspecified: Secondary | ICD-10-CM | POA: Diagnosis not present

## 2017-09-04 DIAGNOSIS — R7301 Impaired fasting glucose: Secondary | ICD-10-CM

## 2017-09-04 DIAGNOSIS — I10 Essential (primary) hypertension: Secondary | ICD-10-CM

## 2017-09-04 LAB — CBC
HCT: 41.6 % (ref 36.0–46.0)
Hemoglobin: 13.9 g/dL (ref 12.0–15.0)
MCHC: 33.5 g/dL (ref 30.0–36.0)
MCV: 84.9 fl (ref 78.0–100.0)
PLATELETS: 266 10*3/uL (ref 150.0–400.0)
RBC: 4.91 Mil/uL (ref 3.87–5.11)
RDW: 13.7 % (ref 11.5–15.5)
WBC: 4.9 10*3/uL (ref 4.0–10.5)

## 2017-09-04 LAB — COMPREHENSIVE METABOLIC PANEL
ALK PHOS: 74 U/L (ref 39–117)
ALT: 19 U/L (ref 0–35)
AST: 13 U/L (ref 0–37)
Albumin: 3.8 g/dL (ref 3.5–5.2)
BILIRUBIN TOTAL: 0.6 mg/dL (ref 0.2–1.2)
BUN: 13 mg/dL (ref 6–23)
CO2: 28 meq/L (ref 19–32)
CREATININE: 0.84 mg/dL (ref 0.40–1.20)
Calcium: 9.2 mg/dL (ref 8.4–10.5)
Chloride: 107 mEq/L (ref 96–112)
GFR: 73.37 mL/min (ref >60.00)
GLUCOSE: 129 mg/dL — AB (ref 70–99)
Potassium: 4.1 mEq/L (ref 3.5–5.1)
Sodium: 143 mEq/L (ref 135–145)
Total Protein: 6.9 g/dL (ref 6.0–8.3)

## 2017-09-04 LAB — HEMOGLOBIN A1C: Hgb A1c MFr Bld: 6.1 % (ref 4.6–6.5)

## 2017-09-04 LAB — LIPID PANEL
CHOL/HDL RATIO: 4
CHOLESTEROL: 162 mg/dL (ref 0–200)
HDL: 45.8 mg/dL (ref >39.00)
LDL Cholesterol: 99 mg/dL (ref 0–99)
NonHDL: 116.43
TRIGLYCERIDES: 89 mg/dL (ref 0.0–149.0)
VLDL: 17.8 mg/dL (ref 0.0–40.0)

## 2017-09-04 LAB — VITAMIN D 25 HYDROXY (VIT D DEFICIENCY, FRACTURES): VITD: 34.04 ng/mL (ref 30.00–100.00)

## 2017-09-04 LAB — TSH: TSH: 1.69 u[IU]/mL (ref 0.35–4.50)

## 2017-09-04 MED ORDER — VENLAFAXINE HCL ER 75 MG PO CP24: 75.0000 mg | ORAL_CAPSULE | Freq: Every day | ORAL | 3 refills | Status: DC

## 2017-09-04 MED ORDER — RANITIDINE HCL 300 MG PO TABS: 300.0000 mg | ORAL_TABLET | Freq: Every day | ORAL | 3 refills | Status: DC

## 2017-09-04 MED ORDER — TRIPLE ANTIBIOTIC 5-400-5000 EX OINT: TOPICAL_OINTMENT | Freq: Two times a day (BID) | CUTANEOUS | 2 refills | Status: DC | PRN

## 2017-09-04 MED ORDER — AMLODIPINE BESYLATE 5 MG PO TABS: 5.0000 mg | ORAL_TABLET | Freq: Every day | ORAL | 3 refills | Status: DC

## 2017-09-04 MED ORDER — DOXYCYCLINE HYCLATE 100 MG PO TABS: 100.0000 mg | ORAL_TABLET | Freq: Two times a day (BID) | ORAL | 0 refills | Status: DC

## 2017-09-04 MED ORDER — LOSARTAN POTASSIUM 100 MG PO TABS: 100.0000 mg | ORAL_TABLET | Freq: Every day | ORAL | 3 refills | Status: DC

## 2017-09-04 NOTE — Progress Notes (Signed)
   Subjective:    Patient ID: Shannon Navarro, female    DOB: 11/11/1955, 62 y.o.   MRN: [id number]  HPI The patient is a 62 YO female coming in for physical. Recent hysterectomy.   PMH, [facility], social history reviewed and updated.  Review of Systems  Constitutional: Negative.   HENT: Negative.   Eyes: Negative.   Respiratory: Negative for cough, chest tightness and shortness of breath.   Cardiovascular: Negative for chest pain, palpitations and leg swelling.  Gastrointestinal: Negative for abdominal distention, abdominal pain, constipation, diarrhea, nausea and vomiting.  Musculoskeletal: Negative.   Skin: Negative.   Neurological: Negative.   Psychiatric/Behavioral: Negative.       Objective:   Physical Exam  Constitutional: She is oriented to person, place, and time. She appears well-developed and well-nourished.  HENT:  Head: Normocephalic and atraumatic.  Eyes: EOM are normal.  Neck: Normal range of motion.  Cardiovascular: Normal rate and regular rhythm.  Pulmonary/Chest: Effort normal and breath sounds normal. No respiratory distress. She has no wheezes. She has no rales.  Abdominal: Soft. Bowel sounds are normal. She exhibits no distension. There is no tenderness. There is no rebound.  Musculoskeletal: She exhibits no edema.  Neurological: She is alert and oriented to person, place, and time. Coordination normal.  Skin: Skin is warm and dry.  Psychiatric: She has a normal mood and affect.   Vitals:   09/04/17 0802  BP: 112/80  Pulse: (!) 57  Temp: 98.1 F (36.7 C)  TempSrc: Oral  SpO2: 96%  Weight: 272 lb (123.4 kg)  Height: 5\' 6"  (1.676 m)      Assessment & Plan:

## 2017-09-04 NOTE — Assessment & Plan Note (Signed)
Checking HgA1c for monitoring.  

## 2017-09-04 NOTE — Patient Instructions (Signed)
We have sent in the doxycycline and the antibiotic cream.   We will get the bone density test and the shingles vaccine.   Health Maintenance, Female Adopting a healthy lifestyle and getting preventive care can go a long way to promote health and wellness. Talk with your health care provider about what schedule of regular examinations is right for you. This is a good chance for you to check in with your provider about disease prevention and staying healthy. In between checkups, there are plenty of things you can do on your own. Experts have done a lot of research about which lifestyle changes and preventive measures are most likely to keep you healthy. Ask your health care provider for more information. Weight and diet Eat a healthy diet  Be sure to include plenty of vegetables, fruits, low-fat dairy products, and lean protein.  Do not eat a lot of foods high in solid fats, added sugars, or salt.  Get regular exercise. This is one of the most important things you can do for your health. ? Most adults should exercise for at least 150 minutes each week. The exercise should increase your heart rate and make you sweat (moderate-intensity exercise). ? Most adults should also do strengthening exercises at least twice a week. This is in addition to the moderate-intensity exercise.  Maintain a healthy weight  Body mass index (BMI) is a measurement that can be used to identify possible weight problems. It estimates body fat based on height and weight. Your health care provider can help determine your BMI and help you achieve or maintain a healthy weight.  For females 61 years of age and older: ? A BMI below 18.5 is considered underweight. ? A BMI of 18.5 to 24.9 is normal. ? A BMI of 25 to 29.9 is considered overweight. ? A BMI of 30 and above is considered obese.  Watch levels of cholesterol and blood lipids  You should start having your blood tested for lipids and cholesterol at 61 years of  age, then have this test every 5 years.  You may need to have your cholesterol levels checked more often if: ? Your lipid or cholesterol levels are high. ? You are older than 61 years of age. ? You are at high risk for heart disease.  Cancer screening Lung Cancer  Lung cancer screening is recommended for adults 46-27 years old who are at high risk for lung cancer because of a history of smoking.  A yearly low-dose CT scan of the lungs is recommended for people who: ? Currently smoke. ? Have quit within the past 15 years. ? Have at least a 30-pack-year history of smoking. A pack year is smoking an average of one pack of cigarettes a day for 1 year.  Yearly screening should continue until it has been 15 years since you quit.  Yearly screening should stop if you develop a health problem that would prevent you from having lung cancer treatment.  Breast Cancer  Practice breast self-awareness. This means understanding how your breasts normally appear and feel.  It also means doing regular breast self-exams. Let your health care provider know about any changes, no matter how small.  If you are in your 20s or 30s, you should have a clinical breast exam (CBE) by a health care provider every 1-3 years as part of a regular health exam.  If you are 86 or older, have a CBE every year. Also consider having a breast X-ray (mammogram) every year.  If you have a family history of breast cancer, talk to your health care provider about genetic screening.  If you are at high risk for breast cancer, talk to your health care provider about having an MRI and a mammogram every year.  Breast cancer gene (BRCA) assessment is recommended for women who have family members with BRCA-related cancers. BRCA-related cancers include: ? Breast. ? Ovarian. ? Tubal. ? Peritoneal cancers.  Results of the assessment will determine the need for genetic counseling and BRCA1 and BRCA2 testing.  Cervical  Cancer Your health care provider may recommend that you be screened regularly for cancer of the pelvic organs (ovaries, uterus, and vagina). This screening involves a pelvic examination, including checking for microscopic changes to the surface of your cervix (Pap test). You may be encouraged to have this screening done every 3 years, beginning at age 28.  For women ages 42-65, health care providers may recommend pelvic exams and Pap testing every 3 years, or they may recommend the Pap and pelvic exam, combined with testing for human papilloma virus (HPV), every 5 years. Some types of HPV increase your risk of cervical cancer. Testing for HPV may also be done on women of any age with unclear Pap test results.  Other health care providers may not recommend any screening for nonpregnant women who are considered low risk for pelvic cancer and who do not have symptoms. Ask your health care provider if a screening pelvic exam is right for you.  If you have had past treatment for cervical cancer or a condition that could lead to cancer, you need Pap tests and screening for cancer for at least 20 years after your treatment. If Pap tests have been discontinued, your risk factors (such as having a new sexual partner) need to be reassessed to determine if screening should resume. Some women have medical problems that increase the chance of getting cervical cancer. In these cases, your health care provider may recommend more frequent screening and Pap tests.  Colorectal Cancer  This type of cancer can be detected and often prevented.  Routine colorectal cancer screening usually begins at 61 years of age and continues through 61 years of age.  Your health care provider may recommend screening at an earlier age if you have risk factors for colon cancer.  Your health care provider may also recommend using home test kits to check for hidden blood in the stool.  A small camera at the end of a tube can be used to  examine your colon directly (sigmoidoscopy or colonoscopy). This is done to check for the earliest forms of colorectal cancer.  Routine screening usually begins at age 21.  Direct examination of the colon should be repeated every 5-10 years through 61 years of age. However, you may need to be screened more often if early forms of precancerous polyps or small growths are found.  Skin Cancer  Check your skin from head to toe regularly.  Tell your health care provider about any new moles or changes in moles, especially if there is a change in a mole's shape or color.  Also tell your health care provider if you have a mole that is larger than the size of a pencil eraser.  Always use sunscreen. Apply sunscreen liberally and repeatedly throughout the day.  Protect yourself by wearing long sleeves, pants, a wide-brimmed hat, and sunglasses whenever you are outside.  Heart disease, diabetes, and high blood pressure  High blood pressure causes heart disease and  increases the risk of stroke. High blood pressure is more likely to develop in: ? People who have blood pressure in the high end of the normal range (130-139/85-89 mm Hg). ? People who are overweight or obese. ? People who are African American.  If you are 73-54 years of age, have your blood pressure checked every 3-5 years. If you are 23 years of age or older, have your blood pressure checked every year. You should have your blood pressure measured twice-once when you are at a hospital or clinic, and once when you are not at a hospital or clinic. Record the average of the two measurements. To check your blood pressure when you are not at a hospital or clinic, you can use: ? An automated blood pressure machine at a pharmacy. ? A home blood pressure monitor.  If you are between 67 years and 20 years old, ask your health care provider if you should take aspirin to prevent strokes.  Have regular diabetes screenings. This involves taking a  blood sample to check your fasting blood sugar level. ? If you are at a normal weight and have a low risk for diabetes, have this test once every three years after 61 years of age. ? If you are overweight and have a high risk for diabetes, consider being tested at a younger age or more often. Preventing infection Hepatitis B  If you have a higher risk for hepatitis B, you should be screened for this virus. You are considered at high risk for hepatitis B if: ? You were born in a country where hepatitis B is common. Ask your health care provider which countries are considered high risk. ? Your parents were born in a high-risk country, and you have not been immunized against hepatitis B (hepatitis B vaccine). ? You have HIV or AIDS. ? You use needles to inject street drugs. ? You live with someone who has hepatitis B. ? You have had sex with someone who has hepatitis B. ? You get hemodialysis treatment. ? You take certain medicines for conditions, including cancer, organ transplantation, and autoimmune conditions.  Hepatitis C  Blood testing is recommended for: ? Everyone born from [birth year] through 1964. ? Anyone with known risk factors for hepatitis C.  Sexually transmitted infections (STIs)  You should be screened for sexually transmitted infections (STIs) including gonorrhea and chlamydia if: ? You are sexually active and are younger than 61 years of age. ? You are older than 61 years of age and your health care provider tells you that you are at risk for this type of infection. ? Your sexual activity has changed since you were last screened and you are at an increased risk for chlamydia or gonorrhea. Ask your health care provider if you are at risk.  If you do not have HIV, but are at risk, it may be recommended that you take a prescription medicine daily to prevent HIV infection. This is called pre-exposure prophylaxis (PrEP). You are considered at risk if: ? You are sexually active and  do not regularly use condoms or know the HIV status of your partner(s). ? You take drugs by injection. ? You are sexually active with a partner who has HIV.  Talk with your health care provider about whether you are at high risk of being infected with HIV. If you choose to begin PrEP, you should first be tested for HIV. You should then be tested every 3 months for as long as you are taking  PrEP. Pregnancy  If you are premenopausal and you may become pregnant, ask your health care provider about preconception counseling.  If you may become pregnant, take 400 to 800 micrograms (mcg) of folic acid every day.  If you want to prevent pregnancy, talk to your health care provider about birth control (contraception). Osteoporosis and menopause  Osteoporosis is a disease in which the bones lose minerals and strength with aging. This can result in serious bone fractures. Your risk for osteoporosis can be identified using a bone density scan.  If you are 45 years of age or older, or if you are at risk for osteoporosis and fractures, ask your health care provider if you should be screened.  Ask your health care provider whether you should take a calcium or vitamin D supplement to lower your risk for osteoporosis.  Menopause may have certain physical symptoms and risks.  Hormone replacement therapy may reduce some of these symptoms and risks. Talk to your health care provider about whether hormone replacement therapy is right for you. Follow these instructions at home:  Schedule regular health, dental, and eye exams.  Stay current with your immunizations.  Do not use any tobacco products including cigarettes, chewing tobacco, or electronic cigarettes.  If you are pregnant, do not drink alcohol.  If you are breastfeeding, limit how much and how often you drink alcohol.  Limit alcohol intake to no more than 1 drink per day for nonpregnant women. One drink equals 12 ounces of beer, 5 ounces of  wine, or 1 ounces of hard liquor.  Do not use street drugs.  Do not share needles.  Ask your health care provider for help if you need support or information about quitting drugs.  Tell your health care provider if you often feel depressed.  Tell your health care provider if you have ever been abused or do not feel safe at home. This information is not intended to replace advice given to you by your health care provider. Make sure you discuss any questions you have with your health care provider. Document Released: 11/11/2010 Document Revised: 10/04/2015 Document Reviewed: 01/30/2015 Elsevier Interactive Patient Education  [employer].

## 2017-09-04 NOTE — Assessment & Plan Note (Signed)
BP at goal on amlodipine, losartan, propranolol as needed. Checking CMP and adjust as needed.

## 2017-09-04 NOTE — Assessment & Plan Note (Signed)
Flu shot yearly. Shingrix added to waiting list. Colonoscopy next year. Mammogram up to date, pap smear not needed s/p hysterectomy and dexa ordered. Counseled about sun safety and mole surveillance. Counseled about the dangers of distracted driving. Given 10 year screening recommendations.

## 2017-09-04 NOTE — Assessment & Plan Note (Signed)
Weight increased slightly after surgery. She is working on more exercise to get some weight off.

## 2017-09-04 NOTE — Assessment & Plan Note (Signed)
Checking vitamin D level today and adjust as needed. 

## 2017-09-14 ENCOUNTER — Telehealth: Payer: Self-pay

## 2017-09-14 NOTE — Telephone Encounter (Signed)
Copied from [place] [id number]. Topic: Inquiry >> Sep 11, 2017  3:26 PM Pricilla Handler wrote: Reason for CRM: Tonya with [facility] [phone], EXT [phone].) Called wanting a diagnosis code to be changed. Kenney Houseman wants a call back to [phone], EXT [phone].       Thank You!!!

## 2017-09-14 NOTE — Telephone Encounter (Signed)
LVM for Shannon Navarro to call back and let us know the diagnosis code it needs to be changed to.

## 2017-09-15 ENCOUNTER — Other Ambulatory Visit: Payer: Self-pay | Admitting: Internal Medicine

## 2017-09-15 DIAGNOSIS — E559 Vitamin D deficiency, unspecified: Secondary | ICD-10-CM

## 2017-09-15 NOTE — Telephone Encounter (Signed)
Noted  

## 2017-09-15 NOTE — Telephone Encounter (Signed)
Spoke to [facility], asked if Vit D was acceptable to patient insurance, she verified and it was good, gave her the code E55.9, this code was used in 2015 for last Dexa, she is going to change the code and send it back for crawford to sign and call the patient to sch,  Please advise

## 2017-09-16 ENCOUNTER — Encounter: Payer: Self-pay | Admitting: Adult Health

## 2017-09-17 ENCOUNTER — Ambulatory Visit (INDEPENDENT_AMBULATORY_CARE_PROVIDER_SITE_OTHER): Payer: BC Managed Care – PPO | Admitting: Adult Health

## 2017-09-17 ENCOUNTER — Encounter: Payer: Self-pay | Admitting: Adult Health

## 2017-09-17 DIAGNOSIS — G4733 Obstructive sleep apnea (adult) (pediatric): Secondary | ICD-10-CM | POA: Diagnosis not present

## 2017-09-17 NOTE — Patient Instructions (Signed)
Keep up the good work Continue on CPAP at bedtime Work on healthy weight Do not drive if sleepy Order for new CPAP machine Follow-up with Dr. Halford Chessman in 1 year and as needed

## 2017-09-17 NOTE — Progress Notes (Signed)
Reviewed and agree with assessment/plan.   Manson Luckadoo, MD [facility] 05/07/2016, 12:24 PM Pager:  [phone]  

## 2017-09-17 NOTE — Addendum Note (Signed)
Addended by: Parke Poisson E on: 09/17/2017 05:22 PM   Modules accepted: Orders

## 2017-09-17 NOTE — Assessment & Plan Note (Signed)
Well-controlled on CPAP New machine order   Plan  Patient Instructions  Keep up the good work Continue on CPAP at bedtime Work on healthy weight Do not drive if sleepy Order for new CPAP machine Follow-up with Dr. Halford Chessman in 1 year and as needed

## 2017-09-17 NOTE — Progress Notes (Signed)
@Patient  ID: Shannon Navarro, female    DOB: Feb 20, 1956, 62 y.o.   MRN: [id number]  Chief Complaint  Patient presents with  . Follow-up    OSA    Referring provider: [name], *  HPI: 62 year old female followed for severe sleep apnea on CPAP  Sleep tests PSG 02/09/12 >> AHI 38.8, SpO2 low 82%.  CPAP 10 cm H2O CPAP 02/09/16 to 04/08/16 >> used on 60 of 60 nights with average 8 hrs 25 min.  Average AHI 1.4 with CPAP 10 cm H2O  Cardiac tests Echo 03/11/16 >> mild LVH, EF 60 to 65%, mild MR, PAS 32 mmHg  09/17/2017 Follow up : OSA  Presents for a follow-up for sleep apnea.  She says she is doing very well on her CPAP at bedtime.  She feels rested with no significant daytime sleepiness.  She does not ever miss a night.  Download shows excellent compliance with 100% usage.  She wears it each night for around 8 hours.  AHI 1.9.  Patient is on CPAP 10 cm H2O. Patient says her machine is very old and needs a new machine.  Allergies  Allergen Reactions  . Cefaclor Rash  . Codeine Nausea And Vomiting  . Hydrocodone Nausea And Vomiting    Severe vomiting/  COUGH SYRUP CAUSE SEVERE VOMITING  . Levaquin [Levofloxacin] Nausea Only  . Nexium [Esomeprazole Magnesium] Other (See Comments)    Made legs ache  . Penicillins Rash  . Sulfa Antibiotics Rash    Immunization History  Administered Date(s) Administered  . Influenza Split 02/10/2011, 03/03/2012, 02/09/2017  . Influenza Whole 01/23/2009  . Influenza,inj,Quad PF,6+ Mos 02/28/2013, 03/11/2016  . Influenza-Unspecified 02/10/2015  . Td 12/14/2009    Past Medical History:  Diagnosis Date  . Allergic rhinitis   . Anticoagulant long-term use    XARELTO  . ANXIETY   . Endometrial polyp   . Endometrial polyp   . Fatty liver 06/23/07  . GERD   . Hiatal hernia   . History of esophageal stricture    mainly due to gerd  . History of exercise stress test 06/13/2015   PER DR HOCHREIN NOTE -- NO EVIDENCE ISCHEMIA  .  HYPERTENSION   . Impaired fasting glucose   . Migraine    hx of migraines none recent  . OSA on CPAP followed by dr Halford Chessman   per study 02-09-2012  severe osa w/ AHI 33.8  . Paroxysmal atrial fibrillation [facility]) cardioloigst-  dr hochrein   dx 02-21-2016 at ED  . Pseudotumor cerebri 2007  . Vitamin D deficiency 07/2013    Tobacco History: Social History   Tobacco Use  Smoking Status Never Smoker  Smokeless Tobacco Never Used  Tobacco Comment   Married lives with spouse + 2 daughters   Counseling given: Not Answered Comment: Married lives with spouse + 2 daughters   Outpatient Encounter Medications as of 09/17/2017  Medication Sig  . amLODipine (NORVASC) 5 MG tablet Take 1 tablet (5 mg total) by mouth daily.  . cetirizine (ZYRTEC) 10 MG tablet Take 10 mg daily by mouth.  . Cholecalciferol (VITAMIN D3) 5000 UNITS TABS Take 5,000 Units by mouth daily. (Patient taking differently: Take 5,000 Units by mouth daily after breakfast. )  . fluticasone (FLONASE) 50 MCG/ACT nasal spray Place 1 spray into both nostrils as needed.  Marland Kitchen losartan (COZAAR) 100 MG tablet Take 1 tablet (100 mg total) by mouth daily.  Marland Kitchen neomycin-bacitracin-polymyxin (NEOSPORIN) [phone] ointment Apply topically 2 (two) times  daily as needed.  . propranolol (INDERAL) 20 MG tablet Take 1 tablet (20 mg total) by mouth 3 (three) times daily as needed. For Heartrate greater 120 only  . ranitidine (ZANTAC) 300 MG tablet Take 1 tablet (300 mg total) by mouth at bedtime.  Marland Kitchen venlafaxine XR (EFFEXOR-XR) 75 MG 24 hr capsule Take 1 capsule (75 mg total) by mouth daily.  Alveda Reasons 20 MG TABS tablet TAKE 1 TABLET BY MOUTH ONCE DAILY WITH SUPPER  . [DISCONTINUED] doxycycline (VIBRA-TABS) 100 MG tablet Take 1 tablet (100 mg total) by mouth 2 (two) times daily. (Patient not taking: Reported on 09/17/2017)   No facility-administered encounter medications on file as of 09/17/2017.      Review of Systems  Constitutional:   No  weight  loss, night sweats,  Fevers, chills, fatigue, or  lassitude.  HEENT:   No headaches,  Difficulty swallowing,  Tooth/dental problems, or  Sore throat,                No sneezing, itching, ear ache, nasal congestion, post nasal drip,   CV:  No chest pain,  Orthopnea, PND, swelling in lower extremities, anasarca, dizziness, palpitations, syncope.   GI  No heartburn, indigestion, abdominal pain, nausea, vomiting, diarrhea, change in bowel habits, loss of appetite, bloody stools.   Resp: No shortness of breath with exertion or at rest.  No excess mucus, no productive cough,  No non-productive cough,  No coughing up of blood.  No change in color of mucus.  No wheezing.  No chest wall deformity  Skin: no rash or lesions.  GU: no dysuria, change in color of urine, no urgency or frequency.  No flank pain, no hematuria   MS:  No joint pain or swelling.  No decreased range of motion.  No back pain.    Physical Exam  BP 122/74 (BP Location: Left Arm, Cuff Size: Large)   Pulse 64   Ht 5\' 6"  (1.676 m)   Wt 273 lb 9.6 oz (124.1 kg)   SpO2 96%   BMI 44.16 kg/m   GEN: A/Ox3; pleasant , NAD, obese    HEENT:  [place]/AT,  EACs-clear, TMs-wnl, NOSE-clear, THROAT-clear, no lesions, no postnasal drip or exudate noted. Class 2-3 MP airway , Tonsils 2+  NECK:  Supple w/ fair ROM; no JVD; normal carotid impulses w/o bruits; no thyromegaly or nodules palpated; no lymphadenopathy.    RESP  Clear  P & A; w/o, wheezes/ rales/ or rhonchi. no accessory muscle use, no dullness to percussion  CARD:  RRR, no m/r/g, no peripheral edema, pulses intact, no cyanosis or clubbing.  GI:   Soft & nt; nml bowel sounds; no organomegaly or masses detected.   Musco: Warm bil, no deformities or joint swelling noted.   Neuro: alert, no focal deficits noted.    Skin: Warm, no lesions or rashes    Lab Results:   BMET  BNP No results found for: BNP  ProBNP No results found for: PROBNP  Imaging: No results  found.   Assessment & Plan:   OSA (obstructive sleep apnea) Well-controlled on CPAP New machine order   Plan  Patient Instructions  Keep up the good work Continue on CPAP at bedtime Work on healthy weight Do not drive if sleepy Order for new CPAP machine Follow-up with Dr. Halford Chessman in 1 year and as needed     Morbid obesity ([place]) Wt loss      Rexene Edison, NP 09/17/2017

## 2017-09-17 NOTE — Assessment & Plan Note (Signed)
Wt loss  

## 2017-10-14 ENCOUNTER — Ambulatory Visit: Payer: BC Managed Care – PPO

## 2017-10-14 ENCOUNTER — Ambulatory Visit (INDEPENDENT_AMBULATORY_CARE_PROVIDER_SITE_OTHER)
Admission: RE | Admit: 2017-10-14 | Discharge: 2017-10-14 | Disposition: A | Discharge status: Home / Self Care | Payer: BC Managed Care – PPO | Source: Ambulatory Visit | Attending: Internal Medicine | Admitting: Internal Medicine

## 2017-10-14 DIAGNOSIS — E559 Vitamin D deficiency, unspecified: Secondary | ICD-10-CM

## 2017-10-15 DIAGNOSIS — E559 Vitamin D deficiency, unspecified: Secondary | ICD-10-CM | POA: Diagnosis not present

## 2017-10-16 ENCOUNTER — Ambulatory Visit: Payer: BC Managed Care – PPO

## 2017-12-09 ENCOUNTER — Encounter: Payer: Self-pay | Admitting: Pulmonary Disease

## 2017-12-09 ENCOUNTER — Ambulatory Visit (INDEPENDENT_AMBULATORY_CARE_PROVIDER_SITE_OTHER): Payer: BC Managed Care – PPO | Admitting: Pulmonary Disease

## 2017-12-09 VITALS — BP 118/72 | HR 62 | Ht 66.0 in | Wt 275.4 lb

## 2017-12-09 DIAGNOSIS — G4733 Obstructive sleep apnea (adult) (pediatric): Secondary | ICD-10-CM | POA: Diagnosis not present

## 2017-12-09 NOTE — Progress Notes (Signed)
[name]  Chief Complaint  Patient presents with  . Follow-up    CPAP    Constitutional: BP 118/72 (BP Location: Right Arm, Patient Position: Sitting, Cuff Size: Large)   Pulse 62   Ht 5\' 6"  (1.676 m)   Wt 275 lb 6.4 oz (124.9 kg)   SpO2 95%   BMI 44.45 kg/m   History of Present Illness: Shannon Navarro is a 62 y.o. female with obstructive sleep apnea.  She saw Tammy in May.  Got new machine.  Working well.  Has nasal mask.  Hasn't been using humidifier.  Gets intermittent nose bleeds.  Has some sinus congestion, and feels right ear is plugged up.   Comprehensive Respiratory Exam:  Appearance - well kempt  ENMT - nasal mucosa moist, turbinates clear, midline nasal septum, no dental lesions, no gingival bleeding, no oral exudates, no tonsillar hypertrophy, cerumen build up on right, mild erosion on right nasal septum Neck - no masses, trachea midline, no thyromegaly, no elevation in JVP Respiratory - normal appearance of chest wall, normal respiratory effort w/o accessory muscle use, no dullness on percussion, no wheezing or rales CV - s1s2 regular rate and rhythm, no murmurs, no peripheral edema, radial pulses symmetric GI - soft, non tender Lymph - no adenopathy noted in neck and axillary areas MSK - normal muscle strength and tone, normal gait Ext - no cyanosis, clubbing, or joint inflammation noted Skin - no rashes, lesions, or ulcers Neuro - oriented to person, place, and time Psych - normal mood and affect   Assessment/Plan:  Obstructive sleep apnea. - she is compliant with CPAP and reports benefit - continue CPAP 10 cm H2O  Intermittent epistaxis and sinus congestion. - could be from CPAP rhinitis - advised her to try using humidifier with CPAP  Cerumen build up Rt ear. - can try debrox - follow up with PCP  Obesity. - discussed importance of weight loss   Patient Instructions  Can try using debrox for ear  wax removal  Follow up in 1 year    Chesley Mires, MD [place] 12/09/2017, 5:00 PM  Flow Sheet  Sleep tests: PSG 02/09/12 >> AHI 38.8, SpO2 low 82%.  CPAP 10 cm H2O ONO with CPAP 04/14/16 >> test time 7 hrs 56 min.  Baseline SpO2 95.3%, low SpO2 89%. CPAP 11/09/17 to 12/08/17 >> used on 30 of 30 nights with average 8 hrs 46 min.  Average AHI 1.2 with CPAP 10 cm H2O  Cardiac tests Echo 03/11/16 >> mild LVH, EF 60 to 65%, mild MR, PAS 32 mmHg  Past Medical History: She  has a past medical history of Allergic rhinitis, Anticoagulant long-term use, ANXIETY, Endometrial polyp, Endometrial polyp, Fatty liver (06/23/07), GERD, Hiatal hernia, History of esophageal stricture, History of exercise stress test (06/13/2015), HYPERTENSION, Impaired fasting glucose, Migraine, OSA on CPAP (followed by dr Halford Chessman), Paroxysmal atrial fibrillation [facility]) (cardioloigst-  dr [name]), Pseudotumor cerebri (2007), and Vitamin D deficiency (07/2013).  Past Surgical History: She  has a past surgical history that includes Dilation and curettage of uterus (2010); Colonoscopy; Upper gastrointestinal endoscopy; transthoracic echocardiogram (03/11/2016); Tubal ligation; colonscopy (2007); right knee arthroscopy (03/2013); Dilatation & curettage/hysteroscopy with myosure (N/A, 03/26/2017); and Laparoscopic hysterectomy (Bilateral, 06/18/2017).  Family History: Her family history includes Aortic stenosis in her father; Asthma in her mother; Breast cancer in her maternal aunt, maternal aunt, and mother; Colon polyps in her father; Diabetes in her father; Heart disease in her father;  Hypertension in her father and mother; Prostate cancer in her father.  Social History: She  reports that she has never smoked. She has never used smokeless tobacco. She reports that she does not drink alcohol or use drugs.  Medications: Allergies as of 12/09/2017      Reactions   Cefaclor Rash   Codeine Nausea And Vomiting    Hydrocodone Nausea And Vomiting   Severe vomiting/  COUGH SYRUP CAUSE SEVERE VOMITING   Levaquin [levofloxacin] Nausea Only   Nexium [esomeprazole Magnesium] Other (See Comments)   Made legs ache   Penicillins Rash   Sulfa Antibiotics Rash      Medication List        Accurate as of 12/09/17  5:00 PM. Always use your most recent med list.          amLODipine 5 MG tablet Commonly known as:  NORVASC Take 1 tablet (5 mg total) by mouth daily.   cetirizine 10 MG tablet Commonly known as:  ZYRTEC Take 10 mg daily by mouth.   fluticasone 50 MCG/ACT nasal spray Commonly known as:  FLONASE Place 1 spray into both nostrils as needed.   losartan 100 MG tablet Commonly known as:  COZAAR Take 1 tablet (100 mg total) by mouth daily.   propranolol 20 MG tablet Commonly known as:  INDERAL Take 1 tablet (20 mg total) by mouth 3 (three) times daily as needed. For Heartrate greater 120 only   venlafaxine XR 75 MG 24 hr capsule Commonly known as:  EFFEXOR-XR Take 1 capsule (75 mg total) by mouth daily.   XARELTO 20 MG Tabs tablet Generic drug:  rivaroxaban TAKE 1 TABLET BY MOUTH ONCE DAILY WITH SUPPER

## 2017-12-09 NOTE — Patient Instructions (Signed)
Can try using debrox for ear wax removal  Follow up in 1 year

## 2017-12-24 ENCOUNTER — Ambulatory Visit (INDEPENDENT_AMBULATORY_CARE_PROVIDER_SITE_OTHER): Payer: BC Managed Care – PPO | Admitting: Internal Medicine

## 2017-12-24 ENCOUNTER — Encounter: Payer: Self-pay | Admitting: Internal Medicine

## 2017-12-24 DIAGNOSIS — J0111 Acute recurrent frontal sinusitis: Secondary | ICD-10-CM | POA: Diagnosis not present

## 2017-12-24 MED ORDER — DOXYCYCLINE HYCLATE 100 MG PO TABS: 100.0000 mg | ORAL_TABLET | Freq: Two times a day (BID) | ORAL | 0 refills | Status: DC

## 2017-12-24 MED ORDER — FLUTICASONE PROPIONATE 50 MCG/ACT NA SUSP: 1.0000 | NASAL | 3 refills | Status: DC | PRN

## 2017-12-24 MED ORDER — NEOMYCIN-POLYMYXIN-HC 3.5-10000-1 OT SOLN: 3.0000 [drp] | Freq: Three times a day (TID) | OTIC | 0 refills | Status: DC

## 2017-12-24 NOTE — Progress Notes (Addendum)
   Subjective:    Patient ID: Shannon Navarro, female    DOB: October 16, 1955, 62 y.o.   MRN: [id number]  HPI The patient is a 62 YO female coming in for sinus infection. Started about 2-3 months ago with waxing and waning symptoms. Overall it is worsening. Associated with pressure in her ears, facial pressure and headaches, hearing changes, sore throat, nose drainage, tooth pain. Denies fevers or chills. Has tried zyrtec daily which has not helped, tried neti pot which was not relieving.  Review of Systems  Constitutional: Positive for activity change and appetite change. Negative for chills, fatigue, fever and unexpected weight change.  HENT: Positive for congestion, ear discharge, ear pain, postnasal drip, rhinorrhea, sinus pressure and sore throat. Negative for sinus pain, sneezing, tinnitus, trouble swallowing and voice change.   Eyes: Negative.   Respiratory: Positive for cough. Negative for chest tightness, shortness of breath and wheezing.   Cardiovascular: Negative.   Gastrointestinal: Negative.   Neurological: Negative.       Objective:   Physical Exam  Constitutional: She is oriented to person, place, and time. She appears well-developed and well-nourished.  HENT:  Head: Normocephalic and atraumatic.  Oropharynx with redness and clear drainage, nose with swollen turbinates, TMs bulging clear fluid bilaterally, facial sinus pain  Eyes: EOM are normal.  Neck: Normal range of motion. No thyromegaly present.  Cardiovascular: Normal rate and regular rhythm.  Pulmonary/Chest: Effort normal and breath sounds normal. No respiratory distress. She has no wheezes. She has no rales.  Abdominal: Soft.  Lymphadenopathy:    She has no cervical adenopathy.  Neurological: She is alert and oriented to person, place, and time.  Skin: Skin is warm and dry.   Vitals:   12/24/17 1600  BP: 116/80  Pulse: (!) 58  Temp: 98.3 F (36.8 C)  TempSrc: Oral  SpO2: 97%  Weight: 273 lb (123.8 kg)    Height: 5\' 6"  (1.676 m)      Assessment & Plan:

## 2017-12-24 NOTE — Patient Instructions (Addendum)
We have sent in doxycycline for the sinuses. Take 1 pill twice a day for 10 days.   Call us back if you are not getting better.   We have sent in the flonase.   We have sent in the ear drops cortisporin to use 3 drops in each ear 3 times per day for 3 days.

## 2017-12-25 NOTE — Assessment & Plan Note (Addendum)
Rx for doxycycline 10 day course given allergy to pcn. She has had recurrent sinusitis in the past. Refill flonase and encouraged her to continue this as well. Rx for cortisporin for ear findings.

## 2018-02-15 ENCOUNTER — Other Ambulatory Visit: Payer: Self-pay | Admitting: Cardiology

## 2018-02-27 ENCOUNTER — Ambulatory Visit (INDEPENDENT_AMBULATORY_CARE_PROVIDER_SITE_OTHER): Payer: BC Managed Care – PPO | Admitting: Family Medicine

## 2018-02-27 ENCOUNTER — Encounter: Payer: Self-pay | Admitting: Family Medicine

## 2018-02-27 DIAGNOSIS — J0111 Acute recurrent frontal sinusitis: Secondary | ICD-10-CM

## 2018-02-27 MED ORDER — DOXYCYCLINE HYCLATE 100 MG PO TABS: 100.0000 mg | ORAL_TABLET | Freq: Two times a day (BID) | ORAL | 0 refills | Status: DC

## 2018-02-27 MED ORDER — FLUTICASONE PROPIONATE 50 MCG/ACT NA SUSP: 2.0000 | Freq: Every day | NASAL | 1 refills | Status: DC

## 2018-02-27 NOTE — Progress Notes (Signed)
  Tommi Rumps, MD Phone: [phone]  Shannon Navarro is a 62 y.o. female who presents today for same day.   Sinusitis: Patient notes 3 weeks of right-sided sinus pressure and congestion with right ear discomfort.  She has had postnasal drip.  Blowing yellow mucus with a little bit of blood out of her nose.  No fevers.  No sick contacts.  She has not tried anything for this.  She does note her eyes feel little watery and sandy.  Feels like her vision is a little blurry related to the wateriness.  No photophobia.  Social History   Tobacco Use  Smoking Status Never Smoker  Smokeless Tobacco Never Used  Tobacco Comment   Married lives with spouse + 2 daughters     ROS see history of present illness  Objective  Physical Exam Vitals:   02/27/18 0919  BP: 122/84  Pulse: (!) 54  SpO2: 98%    BP Readings from Last 3 Encounters:  02/27/18 122/84  12/24/17 116/80  12/09/17 118/72   Wt Readings from Last 3 Encounters:  02/27/18 271 lb (122.9 kg)  12/24/17 273 lb (123.8 kg)  12/09/17 275 lb 6.4 oz (124.9 kg)    Physical Exam  Constitutional: No distress.  HENT:  Head: Normocephalic and atraumatic.  Mouth/Throat: Oropharynx is clear and moist.  2+ tonsils, no exudate or erythema, right maxillary and frontal sinus tenderness to percussion, right TM with slight decreased light reflex with clear fluid behind the TM, no purulent fluid behind either TM  Eyes: Pupils are equal, round, and reactive to light.  Slight conjunctival erythema with clear watery discharge,  Neck: Neck supple.  Cardiovascular: Normal rate, regular rhythm and normal heart sounds.  Pulmonary/Chest: Effort normal and breath sounds normal.  Musculoskeletal: She exhibits no edema.  Lymphadenopathy:    She has no cervical adenopathy.  Neurological: She is alert.  Skin: Skin is warm and dry. She is not diaphoretic.     Assessment/Plan: Please see individual problem list.  Sinusitis Symptoms  consistent with sinusitis of right maxillary and frontal sinuses.  We will treat with doxycycline.  Refill Flonase and encouraged her to use that.  If no improvement she will follow-up.  Possibly with allergic conjunctivitis or viral conjunctivitis.  No indication of bacterial conjunctivitis.   No orders of the defined types were placed in this encounter.   Meds ordered this encounter  Medications  . doxycycline (VIBRA-TABS) 100 MG tablet    Sig: Take 1 tablet (100 mg total) by mouth 2 (two) times daily.    Dispense:  14 tablet    Refill:  0  . fluticasone (FLONASE) 50 MCG/ACT nasal spray    Sig: Place 2 sprays into both nostrils daily.    Dispense:  16 g    Refill:  [place], MD [place]

## 2018-02-27 NOTE — Patient Instructions (Signed)
Nice to see you. You likely have a sinus infection.  We will treat you with doxycycline.  Please use your Flonase. If you develop fevers or any worsening symptoms please be reevaluated.

## 2018-02-27 NOTE — Assessment & Plan Note (Addendum)
Symptoms consistent with sinusitis of right maxillary and frontal sinuses.  We will treat with doxycycline.  Refill Flonase and encouraged her to use that.  If no improvement she will follow-up.  Possibly with allergic conjunctivitis or viral conjunctivitis.  No indication of bacterial conjunctivitis.

## 2018-03-04 ENCOUNTER — Ambulatory Visit: Payer: BC Managed Care – PPO

## 2018-03-12 ENCOUNTER — Other Ambulatory Visit: Payer: Self-pay | Admitting: Obstetrics and Gynecology

## 2018-03-12 ENCOUNTER — Ambulatory Visit (INDEPENDENT_AMBULATORY_CARE_PROVIDER_SITE_OTHER): Payer: BC Managed Care – PPO | Admitting: *Deleted

## 2018-03-12 DIAGNOSIS — Z1231 Encounter for screening mammogram for malignant neoplasm of breast: Secondary | ICD-10-CM

## 2018-03-12 DIAGNOSIS — Z23 Encounter for immunization: Secondary | ICD-10-CM

## 2018-04-23 ENCOUNTER — Ambulatory Visit: Payer: BC Managed Care – PPO

## 2018-05-10 ENCOUNTER — Other Ambulatory Visit: Payer: Self-pay | Admitting: Cardiology

## 2018-05-31 ENCOUNTER — Ambulatory Visit
Admission: RE | Admit: 2018-05-31 | Discharge: 2018-05-31 | Disposition: A | Discharge status: Home / Self Care | Payer: BC Managed Care – PPO | Source: Ambulatory Visit | Attending: Obstetrics and Gynecology | Admitting: Obstetrics and Gynecology

## 2018-05-31 DIAGNOSIS — Z1231 Encounter for screening mammogram for malignant neoplasm of breast: Secondary | ICD-10-CM

## 2018-06-01 ENCOUNTER — Ambulatory Visit: Payer: BC Managed Care – PPO

## 2018-06-06 NOTE — Progress Notes (Signed)
Cardiology Office Note   Date:  06/08/2018   ID:  Shannon Navarro, DOB 1956-03-02, MRN [id number]  PCP/referring MD:  [name], MD  Cardiologist:   Minus Breeding, MD    Chief Complaint  Patient presents with  . Slow heart rate      History of Present Illness: Shannon Navarro is a 63 y.o. female who presents for evaluation of bradycardia.   She had an outpatient exercise tolerance test was ordered and completed on 06/13/2015, there was no evidence of ischemia, she was able to achieve 85% maximal heart rate after 6 minutes, no ST segment deviation was noted. The patient presented to the emergency room on 02/21/2016 with palpitation that woke her up from sleep and found to have new onset of atrial fibrillation. Patient spontaneously converted on diltiazem bolus. Given her elevated CHA2DS2-Vasc score 2 (female and HTN), she was placed on Xarelto.   Since I last saw her she had a hysterectomy.    Since I last saw her she has had continued bradycardia.  She said sometimes her heart rate gets down into the 40s and she gets a little lightheaded.  However, when she moves around it does go up and she feels better.  She is not had any syncope.  She denies any chest pressure, neck or arm discomfort.  She is had no new shortness of breath, PND or orthopnea.  She is had no palpitations, presyncope or syncope.  Past Medical History:  Diagnosis Date  . Allergic rhinitis   . Anticoagulant long-term use    XARELTO  . ANXIETY   . Endometrial polyp   . Endometrial polyp   . Fatty liver 06/23/07  . GERD   . Hiatal hernia   . History of esophageal stricture    mainly due to gerd  . History of exercise stress test 06/13/2015   PER DR Makayia Duplessis NOTE -- NO EVIDENCE ISCHEMIA  . HYPERTENSION   . Impaired fasting glucose   . Migraine    hx of migraines none recent  . OSA on CPAP followed by dr Halford Chessman   per study 02-09-2012  severe osa w/ AHI 33.8  . Paroxysmal atrial fibrillation [facility])  cardioloigst-  dr Tashyra Adduci   dx 02-21-2016 at ED  . Pseudotumor cerebri 2007  . Vitamin D deficiency 07/2013    Past Surgical History:  Procedure Laterality Date  . COLONOSCOPY    . colonscopy  2007   normal  . DILATATION & CURETTAGE/HYSTEROSCOPY WITH MYOSURE N/A 03/26/2017   Procedure: DILATATION & CURETTAGE/HYSTEROSCOPY WITH MYOSURE;  Surgeon: Paula Compton, MD;  Location: [place];  Service: Gynecology;  Laterality: N/A;  . DILATION AND CURETTAGE OF UTERUS  2010  . LAPAROSCOPIC HYSTERECTOMY Bilateral 06/18/2017   Procedure: HYSTERECTOMY TOTAL LAPAROSCOPIC;  Surgeon: Paula Compton, MD;  Location: [facility];  Service: Gynecology;  Laterality: Bilateral;  OUT PT IN BED, needs extra time MD request 2.5hrs in OR  . right knee arthroscopy  03/2013  . TRANSTHORACIC ECHOCARDIOGRAM  03/11/2016   ef 60-65%/  mild MR/ trivial PR and TR  . TUBAL LIGATION    . UPPER GASTROINTESTINAL ENDOSCOPY       Current Outpatient Medications  Medication Sig Dispense Refill  . amLODipine (NORVASC) 2.5 MG tablet Take 1 tablet (2.5 mg total) by mouth daily. 90 tablet 3  . cetirizine (ZYRTEC) 10 MG tablet Take 10 mg daily by mouth.    . fluticasone (FLONASE) 50 MCG/ACT nasal spray  Place 2 sprays into both nostrils daily. 16 g 1  . losartan (COZAAR) 100 MG tablet Take 1 tablet (100 mg total) by mouth daily. 90 tablet 3  . propranolol (INDERAL) 20 MG tablet Take 1 tablet (20 mg total) by mouth 3 (three) times daily as needed. For Heartrate greater 120 only 30 tablet 3  . venlafaxine XR (EFFEXOR-XR) 75 MG 24 hr capsule Take 1 capsule (75 mg total) by mouth daily. 90 capsule 3  . XARELTO 20 MG TABS tablet TAKE 1 TABLET BY MOUTH ONCE DAILY WITH SUPPER 90 tablet 0   No current facility-administered medications for this visit.     Allergies:   Cefaclor; Codeine; Hydrocodone; Levaquin [levofloxacin]; Nexium [esomeprazole magnesium]; Penicillins; and Sulfa antibiotics     ROS:  Please see the history of present illness.   Otherwise, review of systems are positive for none.   All other systems are reviewed and negative.    PHYSICAL EXAM: VS:  BP 128/78   Pulse (!) 55   Ht 5\' 6"  (1.676 m)   Wt 270 lb 12.8 oz (122.8 kg)   BMI 43.71 kg/m  , BMI Body mass index is 43.71 kg/m.  GENERAL:  Well appearing NECK:  No jugular venous distention, waveform within normal limits, carotid upstroke brisk and symmetric, no bruits, no thyromegaly LUNGS:  Clear to auscultation bilaterally CHEST:  Unremarkable HEART:  PMI not displaced or sustained,S1 and S2 within normal limits, no S3, no S4, no clicks, no rubs, no murmurs ABD:  Flat, positive bowel sounds normal in frequency in pitch, no bruits, no rebound, no guarding, no midline pulsatile mass, no hepatomegaly, no splenomegaly EXT:  2 plus pulses throughout, no edema, no cyanosis no clubbing   EKG:  EKG is ordered today. Sinus bradycardia, rate 55, axis within normal limits, intervals within normal limits, no acute ST-T wave changes.   Recent Labs: 09/04/2017: ALT 19; BUN 13; Creatinine, Ser 0.84; Hemoglobin 13.9; Platelets 266.0; Potassium 4.1; Sodium 143; TSH 1.69    Lipid Panel    Component Value Date/Time   CHOL 162 09/04/2017 0835   TRIG 89.0 09/04/2017 0835   HDL 45.80 09/04/2017 0835   CHOLHDL 4 09/04/2017 0835   VLDL 17.8 09/04/2017 0835   LDLCALC 99 09/04/2017 0835      Wt Readings from Last 3 Encounters:  06/08/18 270 lb 12.8 oz (122.8 kg)  02/27/18 271 lb (122.9 kg)  12/24/17 273 lb (123.8 kg)     Other studies Reviewed: Additional studies/ records that were reviewed today include: None Review of the above records demonstrates:     ASSESSMENT AND PLAN:    PAF:  [name] has a CHA2DS2 - VASc score of 2.  She tolerates anticoagulation.  No change in therapy.  MR:  This was mild.  I will follow this clinically.  HTN:  The blood pressure is at target.  I am here to make  a change to her meds however and she will need to keep an eye on this.  BRADYCARDIA:     Her heart rate does run low.  I doubt that is related to Norvasc but she thinks it started when we increase that dose ongoing go back down to 2.5 mg daily.  She will let me know how she does with this.  At this point there is no indication for further invasive testing or therapy.  OBSTRUCTIVE SLEEP APNEA:  She uses CPAP.   She says this works well.  OBESITY: We  did discuss at length exercise because she does not do this.  She has very sedentary lifestyle and job.  She is can work on this.  Current medicines are reviewed at length with the patient today.  The patient does not have concerns regarding medicines.  The following changes have been made:  As above  Labs/ tests ordered today include:  None  No orders of the defined types were placed in this encounter.    Disposition:   FU with me in 4 months.    Signed, Minus Breeding, MD  06/08/2018 9:50 AM    [facility]

## 2018-06-08 ENCOUNTER — Encounter: Payer: Self-pay | Admitting: Cardiology

## 2018-06-08 ENCOUNTER — Ambulatory Visit (INDEPENDENT_AMBULATORY_CARE_PROVIDER_SITE_OTHER): Payer: BC Managed Care – PPO | Admitting: Cardiology

## 2018-06-08 VITALS — BP 128/78 | HR 55 | Ht 66.0 in | Wt 270.8 lb

## 2018-06-08 DIAGNOSIS — I10 Essential (primary) hypertension: Secondary | ICD-10-CM | POA: Diagnosis not present

## 2018-06-08 DIAGNOSIS — R001 Bradycardia, unspecified: Secondary | ICD-10-CM | POA: Insufficient documentation

## 2018-06-08 DIAGNOSIS — I480 Paroxysmal atrial fibrillation: Secondary | ICD-10-CM

## 2018-06-08 MED ORDER — AMLODIPINE BESYLATE 2.5 MG PO TABS: 2.5000 mg | ORAL_TABLET | Freq: Every day | ORAL | 3 refills | Status: DC

## 2018-06-08 MED ORDER — PROPRANOLOL HCL 20 MG PO TABS: 20.0000 mg | ORAL_TABLET | Freq: Three times a day (TID) | ORAL | 3 refills | Status: DC | PRN

## 2018-06-08 NOTE — Patient Instructions (Signed)
Medication Instructions:  Dr [name] has recommended making the following medication changes: 1. DECREASE Amlodipine to 2.5 mg daily  If you need a refill on your cardiac medications before your next appointment, please call your pharmacy.   Follow-Up: At [facility], you and your health needs are our priority.  As part of our continuing mission to provide you with exceptional heart care, we have created designated Provider Care Teams.  These Care Teams include your primary Cardiologist (physician) and Advanced Practice Providers (APPs -  Physician Assistants and Nurse Practitioners) who all work together to provide you with the care you need, when you need it. You will need a follow up appointment in 4 months. You may see Minus Breeding, MD or one of the following Advanced Practice Providers on your designated Care Team:   Rosaria Ferries, PA-C . Jory Sims, DNP, ANP

## 2018-06-09 ENCOUNTER — Encounter: Payer: Self-pay | Admitting: Internal Medicine

## 2018-06-09 ENCOUNTER — Ambulatory Visit (INDEPENDENT_AMBULATORY_CARE_PROVIDER_SITE_OTHER): Payer: BC Managed Care – PPO | Admitting: Internal Medicine

## 2018-06-09 DIAGNOSIS — J0111 Acute recurrent frontal sinusitis: Secondary | ICD-10-CM

## 2018-06-09 MED ORDER — MONTELUKAST SODIUM 10 MG PO TABS: 10.0000 mg | ORAL_TABLET | Freq: Every day | ORAL | 3 refills | Status: DC

## 2018-06-09 MED ORDER — DOXYCYCLINE HYCLATE 100 MG PO TABS: 100.0000 mg | ORAL_TABLET | Freq: Two times a day (BID) | ORAL | 0 refills | Status: DC

## 2018-06-09 NOTE — Addendum Note (Signed)
Addended by: Diana Eves on: 06/09/2018 03:48 PM   Modules accepted: Orders

## 2018-06-09 NOTE — Progress Notes (Signed)
   Subjective:   Patient ID: Shannon Navarro, female    DOB: 04-28-1956, 63 y.o.   MRN: [id number]  HPI The patient is a 63 YO female coming in for sinus problems. Started 4-5 weeks ago. Overall it is worsening. Some chills during this time with sinus pressure and pain. Some headaches and right ear pain. In the last 2 days she had an injury to the top of her mouth as well. While she was eating popcorn. Felt pain and then like a blister which popped several times with drainage of small amount fluid. Is not painful now but she cannot evaluate it herself. Having no cough or SOB. Denies smoking and denies missing her medications. Has tried zyrtec, flonase, sinus rinses without much long term success.   Review of Systems  Constitutional: Positive for activity change, appetite change and chills. Negative for fatigue, fever and unexpected weight change.  HENT: Positive for congestion, ear pain, mouth sores, postnasal drip, rhinorrhea and sinus pressure. Negative for ear discharge, sinus pain, sneezing, sore throat, tinnitus, trouble swallowing and voice change.   Eyes: Negative.   Respiratory: Negative for cough, chest tightness, shortness of breath and wheezing.   Cardiovascular: Negative.   Gastrointestinal: Negative.   Musculoskeletal: Positive for arthralgias and myalgias.  Neurological: Negative.     Objective:  Physical Exam Constitutional:      Appearance: She is well-developed.  HENT:     Head: Normocephalic and atraumatic.     Comments: Oropharynx with redness and clear drainage, nose with swollen turbinates, TMs normal bilaterally.     Mouth/Throat:     Comments: Roof of the mouth with blister about 1 cm circular with some bruising associated, no impacted material or fragments appreciated on exam.  Neck:     Musculoskeletal: Normal range of motion.     Thyroid: No thyromegaly.  Cardiovascular:     Rate and Rhythm: Normal rate and regular rhythm.  Pulmonary:     Effort: Pulmonary  effort is normal. No respiratory distress.     Breath sounds: Normal breath sounds. No wheezing or rales.  Abdominal:     Palpations: Abdomen is soft.  Musculoskeletal:        General: Tenderness present.  Lymphadenopathy:     Cervical: No cervical adenopathy.  Skin:    General: Skin is warm and dry.  Neurological:     Mental Status: She is alert and oriented to person, place, and time.     Vitals:   06/09/18 1055  BP: 120/80  Pulse: (!) 48  Temp: 97.8 F (36.6 C)  TempSrc: Oral  SpO2: 97%  Weight: 270 lb (122.5 kg)  Height: 5\' 6"  (1.676 m)    Assessment & Plan:

## 2018-06-09 NOTE — Patient Instructions (Addendum)
We have sent in singulair to try for the sinuses.   We have sent in doxycycline for the sinuses to take 1 pill twice a day for 10 days.   For the thumb pain try taking tylenol regular for the next 3-4 days.

## 2018-06-10 NOTE — Assessment & Plan Note (Signed)
Acute on chronic sinusitis. Rx for doxycycline given pcn allergy. She is taking zyrtec and flonase. Rx for singulair to see if this helps long term better.

## 2018-08-07 ENCOUNTER — Other Ambulatory Visit: Payer: Self-pay | Admitting: Cardiology

## 2018-08-07 ENCOUNTER — Other Ambulatory Visit: Payer: Self-pay | Admitting: Internal Medicine

## 2018-08-09 NOTE — Telephone Encounter (Signed)
Pt is a 63yr old female who last saw Dr. [name] on 06/08/18, wt at that visit was 122.8Kg. Last SCr was 0.84 on 09/04/17. CrCl is 160mL/min.

## 2018-08-25 ENCOUNTER — Telehealth: Payer: Self-pay | Admitting: Cardiology

## 2018-08-25 ENCOUNTER — Other Ambulatory Visit: Payer: Self-pay

## 2018-08-25 NOTE — Telephone Encounter (Signed)
Pt c/o BP issue: STAT if pt c/o blurred vision, one-sided weakness or slurred speech  1. What are your last 5 BP readings? Monday it was 147/75 and heart rate 49 Yesterday 49 Sunday it was 162/90 and heart rate 56. Yesterdayit was 144/81 and heart rate 52  Today 168/79 and heart rate 53   2. Are you having any other symptoms (ex. Dizziness, headache, blurred vision, passed out)? -Pt wanted you to know she is in pain because of Tendernitis  3. What is your BP issue? Blood Pressure is up- she thinks she needs to go back on 5 mg of Amlodipine

## 2018-08-25 NOTE — Telephone Encounter (Signed)
Pt called to report that at her 05/2018 OV with Dr. [name] pt had decreased her Amlodipine to 2.5mg  a day since she was having a lower HR... since her BP has been up ...  147/75 HR49 162/90  HR 59 144/81 HR 52 168/79  HR 53  Pt says that she feels well no dizziness, sob, chest pain... she says her HR just never gets above these numbers but she is worried about her BP and wants to know if Dr. [name] could put her back on the Amlodipine 5mg  a day.. she has been trying to watch her sodium intake.Marland Kitchen   Her next OV is 10/08/18 and she agrees to making it a virtual visit she has a smart phone... consent sent to her through My Chart.

## 2018-08-26 MED ORDER — AMLODIPINE BESYLATE 5 MG PO TABS: 5.0000 mg | ORAL_TABLET | Freq: Every day | ORAL | 3 refills | Status: DC

## 2018-08-26 NOTE — Telephone Encounter (Signed)
I did attempt to contact patient regarding message from [place], number went straight to voicemail, and unable to leave a message. Message was sent to [employer].   Thanks!

## 2018-08-26 NOTE — Telephone Encounter (Signed)
Spoke with pt letting her know Dr [name] recommendation, Amlodipine 5 mg was send into pt [place], pt voice understanding and thanks

## 2018-08-26 NOTE — Telephone Encounter (Signed)
OK to increase the Norvasc back to 5 mg po daily.

## 2018-09-06 ENCOUNTER — Encounter: Payer: BC Managed Care – PPO | Admitting: Internal Medicine

## 2018-09-13 ENCOUNTER — Encounter: Payer: BC Managed Care – PPO | Admitting: Internal Medicine

## 2018-10-05 ENCOUNTER — Ambulatory Visit (INDEPENDENT_AMBULATORY_CARE_PROVIDER_SITE_OTHER): Payer: BC Managed Care – PPO | Admitting: Internal Medicine

## 2018-10-05 ENCOUNTER — Encounter: Payer: Self-pay | Admitting: Internal Medicine

## 2018-10-05 DIAGNOSIS — J0111 Acute recurrent frontal sinusitis: Secondary | ICD-10-CM | POA: Diagnosis not present

## 2018-10-05 MED ORDER — FLUTICASONE PROPIONATE 50 MCG/ACT NA SUSP: 2.0000 | Freq: Every day | NASAL | 11 refills | Status: DC

## 2018-10-05 MED ORDER — DOXYCYCLINE HYCLATE 100 MG PO TABS: 100.0000 mg | ORAL_TABLET | Freq: Two times a day (BID) | ORAL | 0 refills | Status: DC

## 2018-10-05 NOTE — Progress Notes (Signed)
Virtual Visit via Video Note  I connected with Shannon Navarro on 10/05/18 at  1:00 PM EDT by a video enabled telemedicine application and verified that I am speaking with the correct person using two identifiers.  The patient and the provider were at separate locations throughout the entire encounter.   I discussed the limitations of evaluation and management by telemedicine and the availability of in person appointments. The patient expressed understanding and agreed to proceed.  History of Present Illness: The patient is a 63 y.o. female with visit for sinus problems. Has recurrent sinus infections. Started about 3 weeks ago. Has no fevers or chills or body aches. Minimal cough, no SOB. Pain in sinuses, some headaches, ear pain right more than left, some jaw pain. Taking her allergy medicine but out of flonase currently. Overall it is worsening. Has tried zyrtec and this is not helping. Is not leaving the house unless absolutely necessary and wearing mask.   Observations/Objective: Appearance: normal, breathing appears normal, sinus pressure frontal to self palpation, cheeks slightly red, casual grooming, abdomen does not appear distended, throat drainage, mental status is A and O times 3  Assessment and Plan: See problem oriented charting  Follow Up Instructions: rx doxycycline and flonase  I discussed the assessment and treatment plan with the patient. The patient was provided an opportunity to ask questions and all were answered. The patient agreed with the plan and demonstrated an understanding of the instructions.   The patient was advised to call back or seek an in-person evaluation if the symptoms worsen or if the condition fails to improve as anticipated.  [name], MD

## 2018-10-05 NOTE — Assessment & Plan Note (Addendum)
Recurrent and frontal. Rx for doxycycline and flonase. Encouraged to keep taking flonase regularly during allergy season and call when out.

## 2018-10-06 ENCOUNTER — Telehealth: Payer: Self-pay | Admitting: Cardiology

## 2018-10-06 NOTE — Telephone Encounter (Signed)
smartphone/ my chart/ consent/ pre reg completed  °

## 2018-10-07 NOTE — Progress Notes (Signed)
Virtual Visit via Telephone Note   This visit type was conducted due to national recommendations for restrictions regarding the COVID-19 Pandemic (e.g. social distancing) in an effort to limit this patient's exposure and mitigate transmission in our community.  Due to her co-morbid illnesses, this patient is at least at moderate risk for complications without adequate follow up.  This format is felt to be most appropriate for this patient at this time.  The patient did not have access to video technology/had technical difficulties with video requiring transitioning to audio format only (telephone).  All issues noted in this document were discussed and addressed.  No physical exam could be performed with this format.  Please refer to the patient's chart for her  consent to telehealth for [facility].   Date:  10/08/2018   ID:  Shannon Navarro, DOB [birth year], MRN [id number]  Patient Location: Home Provider Location: Home  PCP:  [name], MD  Cardiologist:  Minus Breeding, MD  Electrophysiologist:  None   Evaluation Performed:  Follow-Up Visit  Chief Complaint:  Atrial fib  History of Present Illness:    Shannon Navarro is a 62 y.o. female with bradycardia.   She had an outpatient exercise tolerance test was ordered and completed on 06/13/2015, there was no evidence of ischemia, she was able to achieve 85% maximal heart rate after 6 minutes, no ST segment deviation was noted. The patient presented to the emergency room on 02/21/2016 with palpitation that woke her up from sleep and found to have new onset of atrial fibrillation. Patient spontaneously converted on diltiazem bolus. Given her elevated CHA2DS2-Vasc score 2 (female and HTN), she was placed on Xarelto.   She had bradycardia.  She was suspecting that this was related to an increase in her Norvasc.  I did not think that this was related to her Norvasc but I did go down on the dose at the last visit.   Her BP went up and  we increased the Norvasc back up.    Since I last saw him he has done well.  The patient denies any new symptoms such as chest discomfort, neck or arm discomfort. There has been no new shortness of breath, PND or orthopnea. There have been no reported palpitations, presyncope or syncope.   She does have slow heart rate but this is not bothering her.    The patient does not have symptoms concerning for COVID-19 infection (fever, chills, cough, or new shortness of breath).    Past Medical History:  Diagnosis Date  . Allergic rhinitis   . Anticoagulant long-term use    XARELTO  . ANXIETY   . Endometrial polyp   . Endometrial polyp   . Fatty liver 06/23/07  . GERD   . Hiatal hernia   . History of esophageal stricture    mainly due to gerd  . History of exercise stress test 06/13/2015   PER DR Yumna Ebers NOTE -- NO EVIDENCE ISCHEMIA  . HYPERTENSION   . Impaired fasting glucose   . Migraine    hx of migraines none recent  . OSA on CPAP followed by dr Halford Chessman   per study 02-09-2012  severe osa w/ AHI 33.8  . Paroxysmal atrial fibrillation [facility]) cardioloigst-  dr Teressa Mcglocklin   dx 02-21-2016 at ED  . Pseudotumor cerebri 2007  . Vitamin D deficiency 07/2013   Past Surgical History:  Procedure Laterality Date  . COLONOSCOPY    . colonscopy  2007   normal  .  DILATATION & CURETTAGE/HYSTEROSCOPY WITH MYOSURE N/A 03/26/2017   Procedure: DILATATION & CURETTAGE/HYSTEROSCOPY WITH MYOSURE;  Surgeon: Paula Compton, MD;  Location: [place];  Service: Gynecology;  Laterality: N/A;  . DILATION AND CURETTAGE OF UTERUS  2010  . LAPAROSCOPIC HYSTERECTOMY Bilateral 06/18/2017   Procedure: HYSTERECTOMY TOTAL LAPAROSCOPIC;  Surgeon: Paula Compton, MD;  Location: [facility];  Service: Gynecology;  Laterality: Bilateral;  OUT PT IN BED, needs extra time MD request 2.5hrs in OR  . right knee arthroscopy  03/2013  . TRANSTHORACIC ECHOCARDIOGRAM  03/11/2016   ef 60-65%/  mild  MR/ trivial PR and TR  . TUBAL LIGATION    . UPPER GASTROINTESTINAL ENDOSCOPY       Current Meds  Medication Sig  . amLODipine (NORVASC) 5 MG tablet Take 1 tablet (5 mg total) by mouth daily.  . cetirizine (ZYRTEC) 10 MG tablet Take 10 mg daily by mouth.  . doxycycline (VIBRA-TABS) 100 MG tablet Take 1 tablet (100 mg total) by mouth 2 (two) times daily.  . fluticasone (FLONASE) 50 MCG/ACT nasal spray Place 2 sprays into both nostrils daily.  Marland Kitchen losartan (COZAAR) 100 MG tablet Take 1 tablet by mouth once daily  . montelukast (SINGULAIR) 10 MG tablet Take 1 tablet (10 mg total) by mouth at bedtime.  Marland Kitchen venlafaxine XR (EFFEXOR-XR) 75 MG 24 hr capsule Take 1 capsule by mouth once daily  . XARELTO 20 MG TABS tablet TAKE 1 TABLET BY MOUTH ONCE DAILY WITH SUPPER     Allergies:   Cefaclor; Codeine; Hydrocodone; Levaquin [levofloxacin]; Nexium [esomeprazole magnesium]; Penicillins; and Sulfa antibiotics   Social History   Tobacco Use  . Smoking status: Never Smoker  . Smokeless tobacco: Never Used  . Tobacco comment: Married lives with spouse + 2 daughters  Substance Use Topics  . Alcohol use: No  . Drug use: No     Family Hx: The patient's family history includes Aortic stenosis in her father; Asthma in her mother; Breast cancer in her maternal aunt, maternal aunt, and mother; Colon polyps in her father; Diabetes in her father; Heart disease in her father; Hypertension in her father and mother; Prostate cancer in her father. There is no history of Colon cancer, Esophageal cancer, Stomach cancer, or Rectal cancer.  ROS:   Please see the history of present illness.    As stated in the HPI and negative for all other systems.   Prior CV studies:   The following studies were reviewed today:  None  Labs/Other Tests and Data Reviewed:    EKG:  No ECG reviewed.  Recent Labs: No results found for requested labs within last 8760 hours.   Recent Lipid Panel Lab Results  Component Value  Date/Time   CHOL 162 09/04/2017 08:35 AM   TRIG 89.0 09/04/2017 08:35 AM   HDL 45.80 09/04/2017 08:35 AM   CHOLHDL 4 09/04/2017 08:35 AM   LDLCALC 99 09/04/2017 08:35 AM    Wt Readings from Last 3 Encounters:  06/09/18 270 lb (122.5 kg)  06/08/18 270 lb 12.8 oz (122.8 kg)  02/27/18 271 lb (122.9 kg)     Objective:    Vital Signs:  BP 138/74   Pulse (!) 51   Ht 5\' 6"  (1.676 m)   BMI 43.58 kg/m    VITAL SIGNS:  reviewed  ASSESSMENT & PLAN:    PAF:  [name] has a CHA2DS2 - VASc score of 2.  The patient  tolerates anticoagulation and she has  had no problems associated with this.  She is had no symptomatic recurrence.  MR:  This was mild.    I will follow this clinically and see her in the office in 1 year.   HTN:  The blood pressure is at target but seems to fluctuate a little bit.  It might run occasionally above 130/85 mark.  She will call me if it trends up.  At that point I probably would switch her from amlodipine to something else.  She does think that the amlodipine did lead to a little bit lower heart rate so she probably would not want to take a higher dose.   BRADYCARDIA:    As above.  OBSTRUCTIVE SLEEP APNEA:  She uses CPAP.     OBESITY:  She understands need to lose weight with diet and exercise.  Weights have been steady.  COVID-19 Education: The signs and symptoms of COVID-19 were discussed with the patient and how to seek care for testing (follow up with PCP or arrange E-visit).  The importance of social distancing was discussed today.  Time:   Today, I have spent 17 minutes with the patient with telehealth technology discussing the above problems.     Medication Adjustments/Labs and Tests Ordered: Current medicines are reviewed at length with the patient today.  Concerns regarding medicines are outlined above.   Tests Ordered: No orders of the defined types were placed in this encounter.   Medication Changes: No orders of the defined  types were placed in this encounter.   Disposition:  Follow up with me in one year  Signed, Minus Breeding, MD  10/08/2018 10:13 AM    [place]

## 2018-10-08 ENCOUNTER — Other Ambulatory Visit: Payer: Self-pay | Admitting: Internal Medicine

## 2018-10-08 ENCOUNTER — Telehealth (INDEPENDENT_AMBULATORY_CARE_PROVIDER_SITE_OTHER): Payer: BC Managed Care – PPO | Admitting: Cardiology

## 2018-10-08 ENCOUNTER — Encounter: Payer: Self-pay | Admitting: Cardiology

## 2018-10-08 ENCOUNTER — Ambulatory Visit: Payer: BC Managed Care – PPO | Admitting: Cardiology

## 2018-10-08 VITALS — BP 138/74 | HR 51 | Ht 66.0 in

## 2018-10-08 DIAGNOSIS — I10 Essential (primary) hypertension: Secondary | ICD-10-CM

## 2018-10-08 DIAGNOSIS — Z7189 Other specified counseling: Secondary | ICD-10-CM | POA: Diagnosis not present

## 2018-10-08 DIAGNOSIS — I480 Paroxysmal atrial fibrillation: Secondary | ICD-10-CM

## 2018-10-08 DIAGNOSIS — R001 Bradycardia, unspecified: Secondary | ICD-10-CM

## 2018-10-08 NOTE — Patient Instructions (Signed)

## 2018-10-27 ENCOUNTER — Encounter: Payer: Self-pay | Admitting: Gastroenterology

## 2018-11-05 ENCOUNTER — Encounter: Payer: BC Managed Care – PPO | Admitting: Internal Medicine

## 2018-11-11 ENCOUNTER — Ambulatory Visit (INDEPENDENT_AMBULATORY_CARE_PROVIDER_SITE_OTHER)
Admission: RE | Admit: 2018-11-11 | Discharge: 2018-11-11 | Disposition: A | Discharge status: Home / Self Care | Payer: BC Managed Care – PPO | Source: Ambulatory Visit | Attending: Internal Medicine | Admitting: Internal Medicine

## 2018-11-11 ENCOUNTER — Ambulatory Visit (INDEPENDENT_AMBULATORY_CARE_PROVIDER_SITE_OTHER): Payer: BC Managed Care – PPO | Admitting: Internal Medicine

## 2018-11-11 ENCOUNTER — Other Ambulatory Visit: Payer: Self-pay

## 2018-11-11 ENCOUNTER — Encounter: Payer: Self-pay | Admitting: Internal Medicine

## 2018-11-11 VITALS — BP 130/90 | HR 64 | Temp 98.3°F | Ht 66.0 in | Wt 270.0 lb

## 2018-11-11 DIAGNOSIS — M79604 Pain in right leg: Secondary | ICD-10-CM | POA: Insufficient documentation

## 2018-11-11 NOTE — Assessment & Plan Note (Signed)
Ordered x-ray of the knee and tib/fib to be done to rule out fracture. Advised RICE if no fracture.

## 2018-11-11 NOTE — Progress Notes (Signed)
   Subjective:   Patient ID: Shannon Navarro, female    DOB: Nov 23, 1955, 63 y.o.   MRN: [id number]  HPI The patient is a 63 YO female coming in for right leg pain. She did fall and hit right leg around the knee and right below. She denies twisting during fall. She has torn meniscus in the right knee previously. The leg is very swollen and tender to touch. She has not tried anything as she was bringing her father to the doctor and did not have time. Denies fevers or chills. Denies cough or SOB. Overall worsening. Pain 7/10 currently. Denies balance problems and just stumbled on something.  Review of Systems  Constitutional: Negative.   Respiratory: Negative for cough, chest tightness and shortness of breath.   Cardiovascular: Negative for chest pain, palpitations and leg swelling.  Gastrointestinal: Negative for abdominal distention, abdominal pain, constipation, diarrhea, nausea and vomiting.  Musculoskeletal: Positive for arthralgias, joint swelling and myalgias.  Skin: Negative.   Neurological: Negative.     Objective:  Physical Exam Constitutional:      Appearance: She is well-developed. She is obese.  HENT:     Head: Normocephalic and atraumatic.  Neck:     Musculoskeletal: Normal range of motion.  Cardiovascular:     Rate and Rhythm: Normal rate and regular rhythm.  Pulmonary:     Effort: Pulmonary effort is normal. No respiratory distress.     Breath sounds: Normal breath sounds. No wheezing or rales.  Abdominal:     General: There is no distension.     Palpations: Abdomen is soft.     Tenderness: There is no abdominal tenderness. There is no rebound.  Musculoskeletal:        General: Swelling and tenderness present.     Comments: Swelling inferior to the right knee and significant swelling with some knee effusion, ACL and PCL intact right knee  Skin:    General: Skin is warm and dry.  Neurological:     Mental Status: She is alert and oriented to person, place, and  time.     Coordination: Coordination normal.     Vitals:   11/11/18 0912  BP: 130/90  Pulse: 64  Temp: 98.3 F (36.8 C)  TempSrc: Oral  SpO2: 98%  Weight: 270 lb (122.5 kg)  Height: 5\' 6"  (1.676 m)    Assessment & Plan:

## 2018-11-14 ENCOUNTER — Encounter (HOSPITAL_COMMUNITY): Payer: Self-pay | Admitting: Emergency Medicine

## 2018-11-14 ENCOUNTER — Emergency Department (HOSPITAL_COMMUNITY)
Admission: EM | Admit: 2018-11-14 | Discharge: 2018-11-15 | Disposition: A | Discharge status: Home / Self Care | Payer: BC Managed Care – PPO | Attending: Emergency Medicine | Admitting: Emergency Medicine

## 2018-11-14 ENCOUNTER — Other Ambulatory Visit: Payer: Self-pay

## 2018-11-14 DIAGNOSIS — S8001XD Contusion of right knee, subsequent encounter: Secondary | ICD-10-CM | POA: Diagnosis not present

## 2018-11-14 DIAGNOSIS — Z7901 Long term (current) use of anticoagulants: Secondary | ICD-10-CM | POA: Diagnosis not present

## 2018-11-14 DIAGNOSIS — Z880 Allergy status to penicillin: Secondary | ICD-10-CM | POA: Insufficient documentation

## 2018-11-14 DIAGNOSIS — S8001XA Contusion of right knee, initial encounter: Secondary | ICD-10-CM

## 2018-11-14 DIAGNOSIS — I10 Essential (primary) hypertension: Secondary | ICD-10-CM | POA: Insufficient documentation

## 2018-11-14 DIAGNOSIS — M25561 Pain in right knee: Secondary | ICD-10-CM | POA: Diagnosis not present

## 2018-11-14 DIAGNOSIS — W010XXD Fall on same level from slipping, tripping and stumbling without subsequent striking against object, subsequent encounter: Secondary | ICD-10-CM | POA: Diagnosis not present

## 2018-11-14 DIAGNOSIS — Z79899 Other long term (current) drug therapy: Secondary | ICD-10-CM | POA: Diagnosis not present

## 2018-11-14 NOTE — ED Provider Notes (Addendum)
[facility] DEPT Provider Note   CSN: [id number] Arrival date & time: 11/14/18  1925    History   Chief Complaint Chief Complaint  Patient presents with  . Knee Injury    HPI Shannon Navarro is a 63 y.o. female with a hx of A. fib, anticoagulated on Xarelto, GERD, hypertension, pseudotumor cerebri presents to the Emergency Department complaining of gradual, persistent, progressively worsening right knee pain and swelling onset Thursday morning at 8am after a fall.  Patient reports she was hurrying into the house to pick up something when the toe of her shoe got caught and she fell directly onto her right knee.  She reports immediate pain however she was able to get up and walk.  Thursday morning she had an x-ray which was without evidence of fracture.  Patient reports since that time she has been able to ambulate however with pain and pressure in the knee and calf.  She reports significant and worsening bruising and swelling to the distal thigh, knee and calf.  She reports pain is minimal at rest however walking exacerbates this significantly.  She is not been taking medications for the pain.  No numbness, tingling or weakness.  Patient denies hitting her head or loss of consciousness.  Patient does report associated "blisters" just distal to her patella.  She reports she has popped several of them with a sterile needle but they continue to recur.  She is worried about infection.  No fevers or chills, no purulent drainage.       The history is provided by the patient and medical records. No language interpreter was used.    Past Medical History:  Diagnosis Date  . Allergic rhinitis   . Anticoagulant long-term use    XARELTO  . ANXIETY   . Endometrial polyp   . Endometrial polyp   . Fatty liver 06/23/07  . GERD   . Hiatal hernia   . History of esophageal stricture    mainly due to gerd  . History of exercise stress test 06/13/2015   PER DR HOCHREIN  NOTE -- NO EVIDENCE ISCHEMIA  . HYPERTENSION   . Impaired fasting glucose   . Migraine    hx of migraines none recent  . OSA on CPAP followed by dr Halford Chessman   per study 02-09-2012  severe osa w/ AHI 33.8  . Paroxysmal atrial fibrillation [facility]) cardioloigst-  dr hochrein   dx 02-21-2016 at ED  . Pseudotumor cerebri 2007  . Vitamin D deficiency 07/2013    Patient Active Problem List   Diagnosis Date Noted  . Right leg pain 11/11/2018  . Educated About Covid-19 Virus Infection 10/08/2018  . Bradycardia 06/08/2018  . PAF (paroxysmal atrial fibrillation) ([place]) 03/05/2017  . Sinusitis 10/16/2015  . Sinus bradycardia 04/11/2015  . Routine general medical examination at a health care facility 06/15/2014  . Bloating 06/15/2014  . Vitamin D deficiency   . OSA (obstructive sleep apnea) 01/16/2012  . Essential hypertension 06/21/2010  . Impaired fasting glucose 12/10/2009  . Anxiety state 10/05/2009  . GERD 10/05/2009  . Morbid obesity ([place]) 10/03/2008  . Allergic rhinitis 10/03/2008    Past Surgical History:  Procedure Laterality Date  . COLONOSCOPY    . colonscopy  2007   normal  . DILATATION & CURETTAGE/HYSTEROSCOPY WITH MYOSURE N/A 03/26/2017   Procedure: DILATATION & CURETTAGE/HYSTEROSCOPY WITH MYOSURE;  Surgeon: Paula Compton, MD;  Location: [facility];  Service: Gynecology;  Laterality: N/A;  . DILATION  AND CURETTAGE OF UTERUS  2010  . LAPAROSCOPIC HYSTERECTOMY Bilateral 06/18/2017   Procedure: HYSTERECTOMY TOTAL LAPAROSCOPIC;  Surgeon: Paula Compton, MD;  Location: [facility];  Service: Gynecology;  Laterality: Bilateral;  OUT PT IN BED, needs extra time MD request 2.5hrs in OR  . right knee arthroscopy  03/2013  . TRANSTHORACIC ECHOCARDIOGRAM  03/11/2016   ef 60-65%/  mild MR/ trivial PR and TR  . TUBAL LIGATION    . UPPER GASTROINTESTINAL ENDOSCOPY       OB History   No obstetric history on file.      Home Medications    Prior  to Admission medications   Medication Sig Start Date End Date Taking? Authorizing Provider  amLODipine (NORVASC) 5 MG tablet Take 1 tablet (5 mg total) by mouth daily. 08/26/18   Minus Breeding, MD  cetirizine (ZYRTEC) 10 MG tablet Take 10 mg daily by mouth.    [provider]  doxycycline (VIBRAMYCIN) 100 MG capsule Take 1 capsule (100 mg total) by mouth 2 (two) times daily. 11/15/18   Chae Shuster, Jarrett Soho, PA-C  fluticasone (FLONASE) 50 MCG/ACT nasal spray Place 2 sprays into both nostrils daily. 10/05/18   [name], MD  losartan (COZAAR) 100 MG tablet Take 1 tablet by mouth once daily 10/08/18   [name], MD  montelukast (SINGULAIR) 10 MG tablet Take 1 tablet (10 mg total) by mouth at bedtime. 06/09/18   [name], MD  venlafaxine XR (EFFEXOR-XR) 75 MG 24 hr capsule Take 1 capsule by mouth once daily 08/09/18   [name], MD  XARELTO 20 MG TABS tablet TAKE 1 TABLET BY MOUTH ONCE DAILY WITH SUPPER 08/09/18   Minus Breeding, MD    Family History Family History  Problem Relation Age of Onset  . Breast cancer Mother   . Asthma Mother   . Hypertension Mother   . Prostate cancer Father   . Aortic stenosis Father   . Hypertension Father   . Colon polyps Father   . Heart disease Father   . Diabetes Father   . Breast cancer Maternal Aunt   . Breast cancer Maternal Aunt   . Colon cancer Neg Hx   . Esophageal cancer Neg Hx   . Stomach cancer Neg Hx   . Rectal cancer Neg Hx     Social History Social History   Tobacco Use  . Smoking status: Never Smoker  . Smokeless tobacco: Never Used  . Tobacco comment: Married lives with spouse + 2 daughters  Substance Use Topics  . Alcohol use: No  . Drug use: No     Allergies   Cefaclor, Codeine, Hydrocodone, Levaquin [levofloxacin], Nexium [esomeprazole magnesium], Penicillins, and Sulfa antibiotics   Review of Systems Review of Systems  Constitutional: Negative for fever.  HENT:  Negative for facial swelling.   Eyes: Negative for visual disturbance.  Respiratory: Negative for chest tightness and shortness of breath.   Cardiovascular: Negative for chest pain.  Gastrointestinal: Negative for abdominal pain, nausea and vomiting.  Musculoskeletal: Positive for arthralgias and joint swelling.  Skin: Positive for color change and wound.  Allergic/Immunologic: Negative for immunocompromised state.  Neurological: Negative for weakness, numbness and headaches.  Hematological: Bruises/bleeds easily.  Psychiatric/Behavioral: The patient is not nervous/anxious.      Physical Exam Updated Vital Signs BP (!) 149/88 (BP Location: Left Arm)   Pulse 69   Temp 98.7 F (37.1 C) (Oral)   Resp 18   Ht 5\' 6"  (1.676  m)   Wt 122.5 kg   SpO2 97%   BMI 43.58 kg/m   Physical Exam Vitals signs and nursing note reviewed.  Constitutional:      General: She is not in acute distress.    Appearance: She is not diaphoretic.  HENT:     Head: Normocephalic.  Eyes:     General: No scleral icterus.    Conjunctiva/sclera: Conjunctivae normal.  Neck:     Musculoskeletal: Normal range of motion.  Cardiovascular:     Rate and Rhythm: Normal rate and regular rhythm.     Pulses: Normal pulses.          Dorsalis pedis pulses are 2+ on the right side and 2+ on the left side.  Pulmonary:     Effort: No tachypnea, accessory muscle usage, prolonged expiration, respiratory distress or retractions.     Breath sounds: No stridor.     Comments: Equal chest rise. No increased work of breathing. Abdominal:     General: There is no distension.  Musculoskeletal:     Right knee: She exhibits decreased range of motion, swelling, effusion and ecchymosis. She exhibits no laceration. Tenderness found.     Comments: Moves upper extremities and left lower extremity without difficulty.  Creased range of motion of the right knee due to pain and swelling.  Full range of motion of the right ankle and toes.   Full range of motion of the right hip.  Compartments are soft and pliable.   Skin:    General: Skin is warm and dry.     Capillary Refill: Capillary refill takes less than 2 seconds.     Comments: Blisters noted just distal to the patella.  No induration or increased warmth.  No streaking.  Neurological:     Mental Status: She is alert.     GCS: GCS eye subscore is 4. GCS verbal subscore is 5. GCS motor subscore is 6.     Comments: Speech is clear and goal oriented.  Psychiatric:        Mood and Affect: Mood normal.              ED Treatments / Results   Radiology Ct Knee Right Wo Contrast  Result Date: 11/15/2018 CLINICAL DATA:  Knee pain after fall with bruising and swelling EXAM: CT OF THE right KNEE WITHOUT CONTRAST TECHNIQUE: Multidetector CT imaging of the right knee was performed according to the standard protocol. Multiplanar CT image reconstructions were also generated. COMPARISON:  Radiograph 11/11/2018 FINDINGS: Bones/Joint/Cartilage Possible subtle nondisplaced fracture at the tip of the fibular head. No additional fracture lucencies are visualized. Small knee effusion. Mild spurring of the medial and lateral joint spaces. Minimal patellofemoral spurring. Ligaments Suboptimally assessed by CT. Edema on both the medial and lateral side of the ligamentous structures. Muscles and Tendons Mild muscular atrophy. Quadriceps and patellar tendons appear grossly intact. Soft tissues Large amount of soft tissue swelling about the knee. Hyperdense collection within the anterior subcutaneous soft tissues measuring approximately 11.2 cm craniocaudad by 1.4 cm thick and extending medial to lateral from the level of the lower patella to the proximal shaft of the tibia, consistent with soft tissue hematoma. IMPRESSION: 1. Findings are questionable for a nondisplaced fracture at the tip of the fibular head. No other discrete fracture abnormality is identified. 2. Large hyperdense collection  within the infrapatellar soft tissues extending to the level of the proximal tibia, consistent with a soft tissue hematoma. 3. Generalized edema about the knee.  There is edema surrounding both the medial and lateral aspects of the knee. If ligamentous injury is a concern, correlation with MRI could be obtained. Electronically Signed   By: Donavan Foil M.D.   On: 11/15/2018 01:40    Procedures Procedures (including critical care time)  Medications Ordered in ED Medications - No data to display   Initial Impression / Assessment and Plan / ED Course  I have reviewed the triage vital signs and the nursing notes.  Pertinent labs & imaging results that were available during my care of the patient were reviewed by me and considered in my medical decision making (see chart for details).  Clinical Course as of Nov 14 208  Nancy Fetter Nov 14, 2018  2352 Pt established with Dr. Ronnie Derby.  Pt has meniscus tear and surgical repair on this knee 4 years ago.    [HM]    Clinical Course User Index [HM] Kamarri Fischetti, Jarrett Soho, [place]       Patient presents with persistent knee pain and swelling after fall.  She is anticoagulated.  Negative x-ray on Thursday.  Will obtain CT scan.  Concern for blistering noted.  Suspect this is likely secondary to edema and bruising, less likely infection, but given patient's concern will begin on antibiotics.  Patient with history of numerous antibiotic allergies but reports she can take doxycycline.  2:03 AM CT shows questionable small fibular head fracture; lucency is small.  With large soft tissue hematoma in the infrapatellar soft tissues.  This is consistent with patient's mechanism of fall.  Suspect this and her anticoagulation is the reason for her significant swelling.  Patient is already established with orthopedics.  Will have close follow-up.  Unable to place knee immobilizer given size. Discussed reasons to return immediately to the emergency department.  Discussed with  Dr. Roxanne Mins who agrees with the plan.   Final Clinical Impressions(s) / ED Diagnoses   Final diagnoses:  Acute pain of right knee  Anticoagulated  Traumatic hematoma of right knee, initial encounter    ED Discharge Orders         Ordered    doxycycline (VIBRAMYCIN) 100 MG capsule  2 times daily     11/15/18 0210           Vineta Carone, Jarrett Soho, PA-C 11/15/18 0211    Geremy Rister, Jarrett Soho, PA-C 93/26/71 [id number]    Delora Fuel, MD 09/98/33 [phone]

## 2018-11-14 NOTE — ED Triage Notes (Signed)
Patient c/o right knee pain after fall x3 days ago. Bruising and swelling to knee and down right leg. Reports negative XR at PCP.

## 2018-11-15 ENCOUNTER — Emergency Department (HOSPITAL_COMMUNITY): Payer: BC Managed Care – PPO

## 2018-11-15 ENCOUNTER — Other Ambulatory Visit: Payer: Self-pay

## 2018-11-15 DIAGNOSIS — M25561 Pain in right knee: Secondary | ICD-10-CM | POA: Diagnosis not present

## 2018-11-15 MED ORDER — DOXYCYCLINE HYCLATE 100 MG PO CAPS: 100.0000 mg | ORAL_CAPSULE | Freq: Two times a day (BID) | ORAL | 0 refills | Status: DC

## 2018-11-15 NOTE — ED Notes (Signed)
Bruising and swelling noted to right knee. Patient states that she has had increasing difficulty bending knee and ambulating. Denies pain at this time.

## 2018-11-15 NOTE — Discharge Instructions (Addendum)
1. Medications: doxycycline, usual home medications 2. Treatment: rest, drink plenty of fluids, continue to ice and elevate 3. Follow Up: Please followup with Dr. Ronnie Derby in 2-3 days for discussion of your diagnoses and further evaluation after today's visit; if you do not have a primary care doctor use the resource guide provided to find one; Please return to the ER for fever, worsening pain, continued swelling or other concerns

## 2018-11-17 ENCOUNTER — Telehealth: Payer: Self-pay | Admitting: Cardiology

## 2018-11-17 DIAGNOSIS — S8010XA Contusion of unspecified lower leg, initial encounter: Secondary | ICD-10-CM | POA: Insufficient documentation

## 2018-11-17 NOTE — Telephone Encounter (Signed)
Please advise,  Patient seen ortho doctor and they suggested coming off the Xarelto until hematoma comes down- she has been dealing with this for about a week. Advised patient I would route to MD to advise.  Thank you!

## 2018-11-17 NOTE — Telephone Encounter (Signed)
OK to hold Xarelto as needed if there is active bleeding.  Marland Kitchen

## 2018-11-17 NOTE — Telephone Encounter (Signed)
  Patient fell and she has a huge hematoma on her knee. She has some swelling and blood coming out. She saw her regular doctor and he wanted her to check with Dr [name] to see if it would be okay for her to come off her Xarelto until the hematoma goes down.

## 2018-11-18 NOTE — Telephone Encounter (Signed)
Advised patient, verbalized understanding  

## 2018-11-27 ENCOUNTER — Other Ambulatory Visit: Payer: Self-pay | Admitting: Internal Medicine

## 2018-12-20 ENCOUNTER — Telehealth: Payer: Self-pay | Admitting: Internal Medicine

## 2018-12-20 NOTE — Telephone Encounter (Signed)
Patient called stating she has an ear infection.  States there is an odor and bloody discharge.  She is requesting an in office appt.  Please advise.

## 2018-12-20 NOTE — Telephone Encounter (Signed)
First visit should be virtual if she is having any sinus congestion or drainage.

## 2018-12-21 ENCOUNTER — Ambulatory Visit (INDEPENDENT_AMBULATORY_CARE_PROVIDER_SITE_OTHER): Payer: BC Managed Care – PPO | Admitting: Internal Medicine

## 2018-12-21 ENCOUNTER — Encounter: Payer: Self-pay | Admitting: Internal Medicine

## 2018-12-21 DIAGNOSIS — R6889 Other general symptoms and signs: Secondary | ICD-10-CM | POA: Diagnosis not present

## 2018-12-21 DIAGNOSIS — H6690 Otitis media, unspecified, unspecified ear: Secondary | ICD-10-CM | POA: Diagnosis not present

## 2018-12-21 DIAGNOSIS — Z20822 Contact with and (suspected) exposure to covid-19: Secondary | ICD-10-CM

## 2018-12-21 MED ORDER — AZITHROMYCIN 250 MG PO TABS: ORAL_TABLET | ORAL | 0 refills | Status: DC

## 2018-12-21 NOTE — Assessment & Plan Note (Signed)
Needs testing for covid-19 due to increase in sinus symptoms as well as cough. Treating presumptively for ear infection with azithromycin due to pcn allergy.

## 2018-12-21 NOTE — Telephone Encounter (Signed)
Patient scheduled.

## 2018-12-21 NOTE — Progress Notes (Signed)
Virtual Visit via Video Note  I connected with Shannon Navarro on 12/21/18 at  1:40 PM EDT by a video enabled telemedicine application and verified that I am speaking with the correct person using two identifiers.  The patient and the provider were at separate locations throughout the entire encounter.   I discussed the limitations of evaluation and management by telemedicine and the availability of in person appointments. The patient expressed understanding and agreed to proceed.  History of Present Illness: The patient is a 63 y.o. female with visit for ear pain and sinus symptoms. She does have chronic sinus problems and gets sinus infection a couple times of year. She is having more sinus problems in the last week. She is also having right ear pain, pressure, drainage. She is having pus and blood drainage from the right ear. Denies fevers or chills. Denies SOB. Has cough. Some muscle achiness in the last week. Started about 1 week ago. Has been taking allergy medication as usual. Denies SOB or chest pain. Overall it is not improving. Has tried allergy medicine without relief.   Observations/Objective: Appearance: normal, breathing appears normal, casual grooming, abdomen does not appear distended, throat with drainage, mild cough during visit, mental status is A and O times 3  Assessment and Plan: See problem oriented charting  Follow Up Instructions: rx azithromycin, test for covid-19  Visit time 25 minutes: greater than 50% of that time was spent in face to face counseling and coordination of care with the patient: counseled about need to quarantine after testing and symptoms as well as likely etiology  I discussed the assessment and treatment plan with the patient. The patient was provided an opportunity to ask questions and all were answered. The patient agreed with the plan and demonstrated an understanding of the instructions.   The patient was advised to call back or seek an  in-person evaluation if the symptoms worsen or if the condition fails to improve as anticipated.  [name], MD

## 2018-12-22 ENCOUNTER — Other Ambulatory Visit: Payer: Self-pay

## 2018-12-22 DIAGNOSIS — Z20822 Contact with and (suspected) exposure to covid-19: Secondary | ICD-10-CM

## 2018-12-23 LAB — NOVEL CORONAVIRUS, NAA: SARS-CoV-2, NAA: NOT DETECTED

## 2019-01-06 ENCOUNTER — Ambulatory Visit (INDEPENDENT_AMBULATORY_CARE_PROVIDER_SITE_OTHER): Payer: BC Managed Care – PPO | Admitting: Internal Medicine

## 2019-01-06 ENCOUNTER — Other Ambulatory Visit (INDEPENDENT_AMBULATORY_CARE_PROVIDER_SITE_OTHER): Payer: BC Managed Care – PPO

## 2019-01-06 ENCOUNTER — Encounter: Payer: Self-pay | Admitting: Internal Medicine

## 2019-01-06 ENCOUNTER — Other Ambulatory Visit: Payer: Self-pay

## 2019-01-06 VITALS — BP 152/82 | HR 59 | Temp 98.0°F | Ht 66.0 in | Wt 279.0 lb

## 2019-01-06 DIAGNOSIS — Z0000 Encounter for general adult medical examination without abnormal findings: Secondary | ICD-10-CM | POA: Diagnosis not present

## 2019-01-06 DIAGNOSIS — R7301 Impaired fasting glucose: Secondary | ICD-10-CM | POA: Diagnosis not present

## 2019-01-06 DIAGNOSIS — I480 Paroxysmal atrial fibrillation: Secondary | ICD-10-CM

## 2019-01-06 DIAGNOSIS — Z23 Encounter for immunization: Secondary | ICD-10-CM | POA: Diagnosis not present

## 2019-01-06 DIAGNOSIS — H6690 Otitis media, unspecified, unspecified ear: Secondary | ICD-10-CM | POA: Diagnosis not present

## 2019-01-06 DIAGNOSIS — E559 Vitamin D deficiency, unspecified: Secondary | ICD-10-CM

## 2019-01-06 DIAGNOSIS — I10 Essential (primary) hypertension: Secondary | ICD-10-CM

## 2019-01-06 DIAGNOSIS — F411 Generalized anxiety disorder: Secondary | ICD-10-CM

## 2019-01-06 LAB — LIPID PANEL
Cholesterol: 160 mg/dL (ref 0–200)
HDL: 45.8 mg/dL (ref >39.00)
LDL Cholesterol: 88 mg/dL (ref 0–99)
NonHDL: 113.98
Total CHOL/HDL Ratio: 3
Triglycerides: 132 mg/dL (ref 0.0–149.0)
VLDL: 26.4 mg/dL (ref 0.0–40.0)

## 2019-01-06 LAB — COMPREHENSIVE METABOLIC PANEL
ALT: 16 U/L (ref 0–35)
AST: 14 U/L (ref 0–37)
Albumin: 4.1 g/dL (ref 3.5–5.2)
Alkaline Phosphatase: 75 U/L (ref 39–117)
BUN: 13 mg/dL (ref 6–23)
CO2: 27 mEq/L (ref 19–32)
Calcium: 9.2 mg/dL (ref 8.4–10.5)
Chloride: 107 mEq/L (ref 96–112)
Creatinine, Ser: 0.87 mg/dL (ref 0.40–1.20)
GFR: 66 mL/min (ref >60.00)
Glucose, Bld: 110 mg/dL — ABNORMAL HIGH (ref 70–99)
Potassium: 3.6 mEq/L (ref 3.5–5.1)
Sodium: 140 mEq/L (ref 135–145)
Total Bilirubin: 0.6 mg/dL (ref 0.2–1.2)
Total Protein: 7.4 g/dL (ref 6.0–8.3)

## 2019-01-06 LAB — CBC
HCT: 41.4 % (ref 36.0–46.0)
Hemoglobin: 13.6 g/dL (ref 12.0–15.0)
MCHC: 32.9 g/dL (ref 30.0–36.0)
MCV: 84.7 fl (ref 78.0–100.0)
Platelets: 301 10*3/uL (ref 150.0–400.0)
RBC: 4.89 Mil/uL (ref 3.87–5.11)
RDW: 13.4 % (ref 11.5–15.5)
WBC: 7.4 10*3/uL (ref 4.0–10.5)

## 2019-01-06 LAB — TSH: TSH: 1.55 u[IU]/mL (ref 0.35–4.50)

## 2019-01-06 LAB — HEMOGLOBIN A1C: Hgb A1c MFr Bld: 5.8 % (ref 4.6–6.5)

## 2019-01-06 LAB — VITAMIN D 25 HYDROXY (VIT D DEFICIENCY, FRACTURES): VITD: 25.11 ng/mL — ABNORMAL LOW (ref 30.00–100.00)

## 2019-01-06 MED ORDER — AZITHROMYCIN 250 MG PO TABS: ORAL_TABLET | ORAL | 0 refills | Status: DC

## 2019-01-06 NOTE — Progress Notes (Signed)
   Subjective:   Patient ID: Shannon Navarro, female    DOB: Mar 07, 1956, 63 y.o.   MRN: [id number]  HPI The patient is a 63 YO female coming in for physical.   PMH, [place], social history reviewed and updated  Review of Systems  Constitutional: Negative.   HENT: Positive for ear pain and sinus pain.   Eyes: Negative.   Respiratory: Negative for cough, chest tightness and shortness of breath.   Cardiovascular: Negative for chest pain, palpitations and leg swelling.  Gastrointestinal: Negative for abdominal distention, abdominal pain, constipation, diarrhea, nausea and vomiting.  Musculoskeletal: Negative.   Skin: Negative.   Neurological: Negative.   Psychiatric/Behavioral: Negative.     Objective:  Physical Exam Constitutional:      Appearance: She is well-developed. She is obese.  HENT:     Head: Normocephalic and atraumatic.     Ears:     Comments: Fluid right ear canal and pain to frontal sinus palpation, clear drainage oropharynx. Neck:     Musculoskeletal: Normal range of motion.  Cardiovascular:     Rate and Rhythm: Normal rate and regular rhythm.  Pulmonary:     Effort: Pulmonary effort is normal. No respiratory distress.     Breath sounds: Normal breath sounds. No wheezing or rales.  Abdominal:     General: Bowel sounds are normal. There is no distension.     Palpations: Abdomen is soft.     Tenderness: There is no abdominal tenderness. There is no rebound.  Skin:    General: Skin is warm and dry.  Neurological:     Mental Status: She is alert and oriented to person, place, and time.     Coordination: Coordination normal.     Vitals:   01/06/19 1556  BP: (!) 152/82  Pulse: (!) 59  Temp: 98 F (36.7 C)  TempSrc: Oral  SpO2: 98%  Weight: 279 lb (126.6 kg)  Height: 5\' 6"  (1.676 m)    Assessment & Plan:  Shingrix IM given at visit

## 2019-01-06 NOTE — Patient Instructions (Signed)
Health Maintenance, Female Adopting a healthy lifestyle and getting preventive care are important in promoting health and wellness. Ask your health care provider about:  The right schedule for you to have regular tests and exams.  Things you can do on your own to prevent diseases and keep yourself healthy. What should I know about diet, weight, and exercise? Eat a healthy diet   Eat a diet that includes plenty of vegetables, fruits, low-fat dairy products, and lean protein.  Do not eat a lot of foods that are high in solid fats, added sugars, or sodium. Maintain a healthy weight Body mass index (BMI) is used to identify weight problems. It estimates body fat based on height and weight. Your health care provider can help determine your BMI and help you achieve or maintain a healthy weight. Get regular exercise Get regular exercise. This is one of the most important things you can do for your health. Most adults should:  Exercise for at least 150 minutes each week. The exercise should increase your heart rate and make you sweat (moderate-intensity exercise).  Do strengthening exercises at least twice a week. This is in addition to the moderate-intensity exercise.  Spend less time sitting. Even light physical activity can be beneficial. Watch cholesterol and blood lipids Have your blood tested for lipids and cholesterol at 62 years of age, then have this test every 5 years. Have your cholesterol levels checked more often if:  Your lipid or cholesterol levels are high.  You are older than 62 years of age.  You are at high risk for heart disease. What should I know about cancer screening? Depending on your health history and family history, you may need to have cancer screening at various ages. This may include screening for:  Breast cancer.  Cervical cancer.  Colorectal cancer.  Skin cancer.  Lung cancer. What should I know about heart disease, diabetes, and high blood  pressure? Blood pressure and heart disease  High blood pressure causes heart disease and increases the risk of stroke. This is more likely to develop in people who have high blood pressure readings, are of African descent, or are overweight.  Have your blood pressure checked: ? Every 3-5 years if you are 18-39 years of age. ? Every year if you are 40 years old or older. Diabetes Have regular diabetes screenings. This checks your fasting blood sugar level. Have the screening done:  Once every three years after age 40 if you are at a normal weight and have a low risk for diabetes.  More often and at a younger age if you are overweight or have a high risk for diabetes. What should I know about preventing infection? Hepatitis B If you have a higher risk for hepatitis B, you should be screened for this virus. Talk with your health care provider to find out if you are at risk for hepatitis B infection. Hepatitis C Testing is recommended for:  Everyone born from 1945 through 1965.  Anyone with known risk factors for hepatitis C. Sexually transmitted infections (STIs)  Get screened for STIs, including gonorrhea and chlamydia, if: ? You are sexually active and are younger than 62 years of age. ? You are older than 62 years of age and your health care provider tells you that you are at risk for this type of infection. ? Your sexual activity has changed since you were last screened, and you are at increased risk for chlamydia or gonorrhea. Ask your health care provider if   you are at risk.  Ask your health care provider about whether you are at high risk for HIV. Your health care provider may recommend a prescription medicine to help prevent HIV infection. If you choose to take medicine to prevent HIV, you should first get tested for HIV. You should then be tested every 3 months for as long as you are taking the medicine. Pregnancy  If you are about to stop having your period (premenopausal) and  you may become pregnant, seek counseling before you get pregnant.  Take 400 to 800 micrograms (mcg) of folic acid every day if you become pregnant.  Ask for birth control (contraception) if you want to prevent pregnancy. Osteoporosis and menopause Osteoporosis is a disease in which the bones lose minerals and strength with aging. This can result in bone fractures. If you are 65 years old or older, or if you are at risk for osteoporosis and fractures, ask your health care provider if you should:  Be screened for bone loss.  Take a calcium or vitamin D supplement to lower your risk of fractures.  Be given hormone replacement therapy (HRT) to treat symptoms of menopause. Follow these instructions at home: Lifestyle  Do not use any products that contain nicotine or tobacco, such as cigarettes, e-cigarettes, and chewing tobacco. If you need help quitting, ask your health care provider.  Do not use street drugs.  Do not share needles.  Ask your health care provider for help if you need support or information about quitting drugs. Alcohol use  Do not drink alcohol if: ? Your health care provider tells you not to drink. ? You are pregnant, may be pregnant, or are planning to become pregnant.  If you drink alcohol: ? Limit how much you use to 0-1 drink a day. ? Limit intake if you are breastfeeding.  Be aware of how much alcohol is in your drink. In the [place] one drink equals one 12 oz bottle of beer (355 mL), one 5 oz glass of wine (148 mL), or one 1 oz glass of hard liquor (44 mL). General instructions  Schedule regular health, dental, and eye exams.  Stay current with your vaccines.  Tell your health care provider if: ? You often feel depressed. ? You have ever been abused or do not feel safe at home. Summary  Adopting a healthy lifestyle and getting preventive care are important in promoting health and wellness.  Follow your health care provider's instructions about healthy  diet, exercising, and getting tested or screened for diseases.  Follow your health care provider's instructions on monitoring your cholesterol and blood pressure. This information is not intended to replace advice given to you by your health care provider. Make sure you discuss any questions you have with your health care provider. Document Released: 11/11/2010 Document Revised: 04/21/2018 Document Reviewed: 04/21/2018 Elsevier Patient Education  2020 [employer]  

## 2019-01-07 NOTE — Assessment & Plan Note (Signed)
Not fully resolved but improving. Rx azithromycin 10 day course.

## 2019-01-07 NOTE — Assessment & Plan Note (Signed)
Effexor still helping well and coping with pandemic okay.

## 2019-01-07 NOTE — Assessment & Plan Note (Signed)
Checking vitamin D level and adjust as needed. Not on otc currently.

## 2019-01-07 NOTE — Assessment & Plan Note (Signed)
BP at goal on losartan and amlodipine at home. Reading today is slightly up. Checking CMP and adjust as needed.

## 2019-01-07 NOTE — Assessment & Plan Note (Signed)
Checking HgA1c for pre-diabetes.

## 2019-01-07 NOTE — Assessment & Plan Note (Signed)
Taking xarelto and no palpitations recently, sounds to be regular on exam today. Rate controlled.

## 2019-01-07 NOTE — Assessment & Plan Note (Signed)
Weight up slightly with pandemic due to less activity and she will work on it.

## 2019-01-07 NOTE — Assessment & Plan Note (Signed)
Flu shot wants in 2 months with second shingrix. Shingrix 1st given today. Tetanus up to date. Colonoscopy defer until next year counseled about cologuard. Mammogram up to date, pap smear up to date no more indicated. Counseled about sun safety and mole surveillance. Counseled about the dangers of distracted driving. Given 10 year screening recommendations.

## 2019-01-22 ENCOUNTER — Other Ambulatory Visit: Payer: Self-pay | Admitting: Internal Medicine

## 2019-02-25 ENCOUNTER — Other Ambulatory Visit: Payer: Self-pay | Admitting: Cardiology

## 2019-02-25 ENCOUNTER — Other Ambulatory Visit: Payer: Self-pay

## 2019-02-25 ENCOUNTER — Ambulatory Visit (INDEPENDENT_AMBULATORY_CARE_PROVIDER_SITE_OTHER): Payer: BC Managed Care – PPO

## 2019-02-25 DIAGNOSIS — Z23 Encounter for immunization: Secondary | ICD-10-CM

## 2019-03-01 ENCOUNTER — Other Ambulatory Visit: Payer: Self-pay

## 2019-03-01 MED ORDER — RIVAROXABAN 20 MG PO TABS: ORAL_TABLET | ORAL | 2 refills | Status: DC

## 2019-03-05 ENCOUNTER — Other Ambulatory Visit: Payer: Self-pay | Admitting: Internal Medicine

## 2019-03-08 ENCOUNTER — Encounter: Payer: Self-pay | Admitting: Internal Medicine

## 2019-03-08 ENCOUNTER — Other Ambulatory Visit: Payer: Self-pay

## 2019-03-08 ENCOUNTER — Ambulatory Visit (INDEPENDENT_AMBULATORY_CARE_PROVIDER_SITE_OTHER): Payer: BC Managed Care – PPO | Admitting: Internal Medicine

## 2019-03-08 VITALS — BP 130/80 | HR 57 | Temp 98.3°F | Ht 66.0 in | Wt 281.0 lb

## 2019-03-08 DIAGNOSIS — R21 Rash and other nonspecific skin eruption: Secondary | ICD-10-CM

## 2019-03-08 DIAGNOSIS — H6690 Otitis media, unspecified, unspecified ear: Secondary | ICD-10-CM | POA: Diagnosis not present

## 2019-03-08 MED ORDER — CLOTRIMAZOLE-BETAMETHASONE 1-0.05 % EX CREA: 1.0000 "application " | TOPICAL_CREAM | Freq: Two times a day (BID) | CUTANEOUS | 0 refills | Status: DC

## 2019-03-08 MED ORDER — NEOMYCIN-POLYMYXIN-HC 3.5-10000-1 OT SUSP: 3.0000 [drp] | Freq: Three times a day (TID) | OTIC | 0 refills | Status: DC

## 2019-03-08 NOTE — Assessment & Plan Note (Signed)
Rx cortisporin to help with fluid and inflammation and asked to start flonase daily for the next 1-2 months during allergy season to prevent recurrence.

## 2019-03-08 NOTE — Progress Notes (Signed)
   Subjective:   Patient ID: Shannon Navarro, female    DOB: 01/26/1956, 63 y.o.   MRN: [id number]  HPI The patient is a 63 YO female coming in for rash (around the bikini line, with itching and burns if scratched, has tried many things on this to help and is worsening) and right ear pain and fulness (associated with sinuses, she is not faithful with her flonase, it does help when used regularly, denies pain more pressure and popping sensation, denies sinus pain or pressure, denies cough or SOB, denies fevers or chills).   Review of Systems  Constitutional: Negative.   HENT: Positive for ear pain.   Eyes: Negative.   Respiratory: Negative for cough, chest tightness and shortness of breath.   Cardiovascular: Negative for chest pain, palpitations and leg swelling.  Gastrointestinal: Negative for abdominal distention, abdominal pain, constipation, diarrhea, nausea and vomiting.  Musculoskeletal: Negative.   Skin: Positive for rash.  Neurological: Negative.   Psychiatric/Behavioral: Negative.     Objective:  Physical Exam Constitutional:      Appearance: She is well-developed.  HENT:     Head: Normocephalic and atraumatic.     Ears:     Comments: Right ear TM bulging with clear fluid, left ear normal TM Neck:     Musculoskeletal: Normal range of motion.  Cardiovascular:     Rate and Rhythm: Normal rate and regular rhythm.  Pulmonary:     Effort: Pulmonary effort is normal. No respiratory distress.     Breath sounds: Normal breath sounds. No wheezing or rales.  Abdominal:     General: Bowel sounds are normal. There is no distension.     Palpations: Abdomen is soft.     Tenderness: There is no abdominal tenderness. There is no rebound.  Skin:    General: Skin is warm and dry.     Findings: Rash present.     Comments: Typical tinea inguinal region  Neurological:     Mental Status: She is alert and oriented to person, place, and time.     Coordination: Coordination normal.      Vitals:   03/08/19 0800  BP: 130/80  Pulse: (!) 57  Temp: 98.3 F (36.8 C)  TempSrc: Oral  SpO2: 97%  Weight: 281 lb (127.5 kg)  Height: 5\' 6"  (1.676 m)    Assessment & Plan:

## 2019-03-08 NOTE — Assessment & Plan Note (Signed)
Rx lotrisone cream to use.

## 2019-03-08 NOTE — Patient Instructions (Signed)
We have sent in the cream to use twice a day until rash gone.  We have sent in the ear drops to use 3 times a day. Then start the nose spray.

## 2019-03-14 ENCOUNTER — Other Ambulatory Visit: Payer: Self-pay

## 2019-03-14 ENCOUNTER — Ambulatory Visit (INDEPENDENT_AMBULATORY_CARE_PROVIDER_SITE_OTHER): Payer: BC Managed Care – PPO

## 2019-03-14 DIAGNOSIS — Z23 Encounter for immunization: Secondary | ICD-10-CM | POA: Diagnosis not present

## 2019-03-14 DIAGNOSIS — Z299 Encounter for prophylactic measures, unspecified: Secondary | ICD-10-CM

## 2019-04-21 ENCOUNTER — Ambulatory Visit (INDEPENDENT_AMBULATORY_CARE_PROVIDER_SITE_OTHER): Payer: BC Managed Care – PPO | Admitting: Internal Medicine

## 2019-04-21 DIAGNOSIS — J0111 Acute recurrent frontal sinusitis: Secondary | ICD-10-CM

## 2019-04-21 MED ORDER — DOXYCYCLINE HYCLATE 100 MG PO TABS: 100.0000 mg | ORAL_TABLET | Freq: Two times a day (BID) | ORAL | 0 refills | Status: DC

## 2019-04-21 MED ORDER — FLUTICASONE PROPIONATE 50 MCG/ACT NA SUSP: 2.0000 | Freq: Every day | NASAL | 11 refills | Status: DC

## 2019-04-21 NOTE — Progress Notes (Signed)
Virtual Visit via Video Note  I connected with Shannon Navarro on 04/21/19 at  3:40 PM EST by a video enabled telemedicine application and verified that I am speaking with the correct person using two identifiers.  The patient and the provider were at separate locations throughout the entire encounter.   I discussed the limitations of evaluation and management by telemedicine and the availability of in person appointments. The patient expressed understanding and agreed to proceed. The patient and the provider were the only parties present for the visit unless noted in HPI below.  History of Present Illness: The patient is a 63 y.o. female with visit for sinus infection. Test for covid-19 negative within last week. Started 2-3 weeks ago with sinus pressure, headaches. Denies fevers or chills. Does have mild cough but no SOB. Denies muscle aches. Denies loss of taste or smell. Overall it is worsening. Has tried otc allergy medicine, needs refill of flonase as almost out.  Observations/Objective: Appearance: normal, breathing appears nasally, casual grooming, abdomen does not appear distended, throat with drainage, memory normal, mental status is A and O times 3  Assessment and Plan: See problem oriented charting  Follow Up Instructions: rx doxycycline and flonase  I discussed the assessment and treatment plan with the patient. The patient was provided an opportunity to ask questions and all were answered. The patient agreed with the plan and demonstrated an understanding of the instructions.   The patient was advised to call back or seek an in-person evaluation if the symptoms worsen or if the condition fails to improve as anticipated.  [name], MD

## 2019-04-22 ENCOUNTER — Encounter: Payer: Self-pay | Admitting: Internal Medicine

## 2019-04-22 NOTE — Assessment & Plan Note (Signed)
Rx doxycycline and flonase. Counseled about preventative measures against covid-19. Recent covid-19 test negative.

## 2019-04-29 ENCOUNTER — Other Ambulatory Visit: Payer: Self-pay | Admitting: Obstetrics and Gynecology

## 2019-04-29 DIAGNOSIS — Z1231 Encounter for screening mammogram for malignant neoplasm of breast: Secondary | ICD-10-CM

## 2019-05-01 ENCOUNTER — Other Ambulatory Visit: Payer: Self-pay | Admitting: Cardiology

## 2019-05-31 ENCOUNTER — Emergency Department (HOSPITAL_COMMUNITY)
Admission: EM | Admit: 2019-05-31 | Discharge: 2019-06-01 | Disposition: A | Discharge status: Home / Self Care | Payer: BC Managed Care – PPO | Attending: Emergency Medicine | Admitting: Emergency Medicine

## 2019-05-31 ENCOUNTER — Other Ambulatory Visit: Payer: Self-pay

## 2019-05-31 ENCOUNTER — Encounter (HOSPITAL_COMMUNITY): Payer: Self-pay | Admitting: Emergency Medicine

## 2019-05-31 ENCOUNTER — Emergency Department (HOSPITAL_COMMUNITY): Payer: BC Managed Care – PPO

## 2019-05-31 DIAGNOSIS — U071 COVID-19: Secondary | ICD-10-CM | POA: Insufficient documentation

## 2019-05-31 DIAGNOSIS — I10 Essential (primary) hypertension: Secondary | ICD-10-CM | POA: Insufficient documentation

## 2019-05-31 DIAGNOSIS — R05 Cough: Secondary | ICD-10-CM | POA: Diagnosis present

## 2019-05-31 DIAGNOSIS — Z79899 Other long term (current) drug therapy: Secondary | ICD-10-CM | POA: Insufficient documentation

## 2019-05-31 NOTE — ED Triage Notes (Signed)
Per EMS: Pt is coming from home with c/o covid +, cough, and chest wall pain. Pt denies SOB and dizziness at this time. Pt is A&Ox4 and ambulatory to stretcher. Pt tested positive for COVID on 05/30/19 and has been having symptoms since Sunday, 03/28/20. Patient is anxious because her BP has been reading high at home.   EMS VITALS: BP 146/84 HR 69 SPO2 97% RA TEMP 99.5

## 2019-06-01 ENCOUNTER — Encounter (HOSPITAL_COMMUNITY): Payer: Self-pay | Admitting: Emergency Medicine

## 2019-06-01 ENCOUNTER — Telehealth: Payer: Self-pay | Admitting: Internal Medicine

## 2019-06-01 LAB — CBC WITH DIFFERENTIAL/PLATELET
Abs Immature Granulocytes: 0.01 10*3/uL (ref 0.00–0.07)
Basophils Absolute: 0 10*3/uL (ref 0.0–0.1)
Basophils Relative: 1 %
Eosinophils Absolute: 0.2 10*3/uL (ref 0.0–0.5)
Eosinophils Relative: 4 %
HCT: 42.3 % (ref 36.0–46.0)
Hemoglobin: 13.3 g/dL (ref 12.0–15.0)
Immature Granulocytes: 0 %
Lymphocytes Relative: 15 %
Lymphs Abs: 0.8 10*3/uL (ref 0.7–4.0)
MCH: 28 pg (ref 26.0–34.0)
MCHC: 31.4 g/dL (ref 30.0–36.0)
MCV: 89.1 fL (ref 80.0–100.0)
Monocytes Absolute: 0.5 10*3/uL (ref 0.1–1.0)
Monocytes Relative: 11 %
Neutro Abs: 3.4 10*3/uL (ref 1.7–7.7)
Neutrophils Relative %: 69 %
Platelets: 226 10*3/uL (ref 150–400)
RBC: 4.75 MIL/uL (ref 3.87–5.11)
RDW: 13.8 % (ref 11.5–15.5)
WBC: 4.9 10*3/uL (ref 4.0–10.5)
nRBC: 0 % (ref 0.0–0.2)

## 2019-06-01 LAB — BASIC METABOLIC PANEL
Anion gap: 7 (ref 5–15)
BUN: 11 mg/dL (ref 8–23)
CO2: 28 mmol/L (ref 22–32)
Calcium: 8.7 mg/dL — ABNORMAL LOW (ref 8.9–10.3)
Chloride: 106 mmol/L (ref 98–111)
Creatinine, Ser: 0.79 mg/dL (ref 0.44–1.00)
GFR calc Af Amer: >60 mL/min (ref >60)
GFR calc non Af Amer: >60 mL/min (ref >60)
Glucose, Bld: 163 mg/dL — ABNORMAL HIGH (ref 70–99)
Potassium: 3.8 mmol/L (ref 3.5–5.1)
Sodium: 141 mmol/L (ref 135–145)

## 2019-06-01 LAB — TROPONIN I (HIGH SENSITIVITY)
Troponin I (High Sensitivity): 2 ng/L (ref <18)
Troponin I (High Sensitivity): 2 ng/L (ref <18)

## 2019-06-01 MED ORDER — ACETAMINOPHEN 500 MG PO TABS
1000.0000 mg | ORAL_TABLET | Freq: Once | ORAL | Status: AC
  Administered 2019-06-01: 1000 mg via ORAL
  Filled 2019-06-01: qty 2

## 2019-06-01 NOTE — ED Provider Notes (Addendum)
[facility] DEPT Provider Note   CSN: [id number] Arrival date & time: 05/31/19  2258     History Chief Complaint  Patient presents with  . Covid +    Shannon Navarro is a 64 y.o. female.  The history is provided by the patient.  Cough Cough characteristics:  Non-productive Severity:  Moderate Onset quality:  Gradual Timing:  Sporadic Progression:  Worsening Chronicity:  New Context: upper respiratory infection   Context comment:  Covid positive since yesterday Relieved by:  Nothing Worsened by:  Nothing Ineffective treatments:  None tried Associated symptoms: no chest pain, no chills, no diaphoresis, no eye discharge, no fever, no shortness of breath and no sore throat   Risk factors: no chemical exposure   Patient with anxiety on full dose xarelto and diagnosed with covid comes in for ongoing cough and pain only when coughing.  No DOE.  No SOB.  No n/v/d.  No chest pain at rest or with exertion.  No diarrhea.  Does have loss of taste and smell.       Past Medical History:  Diagnosis Date  . Allergic rhinitis   . Anticoagulant long-term use    XARELTO  . ANXIETY   . Endometrial polyp   . Endometrial polyp   . Fatty liver 06/23/07  . GERD   . Hiatal hernia   . History of esophageal stricture    mainly due to gerd  . History of exercise stress test 06/13/2015   PER DR HOCHREIN NOTE -- NO EVIDENCE ISCHEMIA  . HYPERTENSION   . Impaired fasting glucose   . Migraine    hx of migraines none recent  . OSA on CPAP followed by dr Halford Chessman   per study 02-09-2012  severe osa w/ AHI 33.8  . Paroxysmal atrial fibrillation [facility]) cardioloigst-  dr hochrein   dx 02-21-2016 at ED  . Pseudotumor cerebri 2007  . Vitamin D deficiency 07/2013    Patient Active Problem List   Diagnosis Date Noted  . Rash 03/08/2019  . Right leg pain 11/11/2018  . Bradycardia 06/08/2018  . PAF (paroxysmal atrial fibrillation) ([place]) 03/05/2017  . Acute sinusitis  10/16/2015  . Sinus bradycardia 04/11/2015  . Routine general medical examination at a health care facility 06/15/2014  . Vitamin D deficiency   . OSA (obstructive sleep apnea) 01/16/2012  . Essential hypertension 06/21/2010  . Impaired fasting glucose 12/10/2009  . Anxiety state 10/05/2009  . GERD 10/05/2009  . Morbid obesity ([place]) 10/03/2008  . Allergic rhinitis 10/03/2008    Past Surgical History:  Procedure Laterality Date  . COLONOSCOPY    . colonscopy  2007   normal  . DILATATION & CURETTAGE/HYSTEROSCOPY WITH MYOSURE N/A 03/26/2017   Procedure: DILATATION & CURETTAGE/HYSTEROSCOPY WITH MYOSURE;  Surgeon: Paula Compton, MD;  Location: [facility];  Service: Gynecology;  Laterality: N/A;  . DILATION AND CURETTAGE OF UTERUS  2010  . LAPAROSCOPIC HYSTERECTOMY Bilateral 06/18/2017   Procedure: HYSTERECTOMY TOTAL LAPAROSCOPIC;  Surgeon: Paula Compton, MD;  Location: [facility];  Service: Gynecology;  Laterality: Bilateral;  OUT PT IN BED, needs extra time MD request 2.5hrs in OR  . right knee arthroscopy  03/2013  . TRANSTHORACIC ECHOCARDIOGRAM  03/11/2016   ef 60-65%/  mild MR/ trivial PR and TR  . TUBAL LIGATION    . UPPER GASTROINTESTINAL ENDOSCOPY       OB History   No obstetric history on file.     Family History  Problem Relation Age of Onset  . Breast cancer Mother   . Asthma Mother   . Hypertension Mother   . Prostate cancer Father   . Aortic stenosis Father   . Hypertension Father   . Colon polyps Father   . Heart disease Father   . Diabetes Father   . Breast cancer Maternal Aunt   . Breast cancer Maternal Aunt   . Colon cancer Neg Hx   . Esophageal cancer Neg Hx   . Stomach cancer Neg Hx   . Rectal cancer Neg Hx     Social History   Tobacco Use  . Smoking status: Never Smoker  . Smokeless tobacco: Never Used  . Tobacco comment: Married lives with spouse + 2 daughters  Substance Use Topics  . Alcohol use: No  .  Drug use: No    Home Medications Prior to Admission medications   Medication Sig Start Date End Date Taking? Authorizing Provider  amLODipine (NORVASC) 5 MG tablet Take 1 tablet by mouth once daily 05/02/19   Minus Breeding, MD  cetirizine (ZYRTEC) 10 MG tablet Take 10 mg daily by mouth.    [provider]  clotrimazole-betamethasone (LOTRISONE) cream Apply 1 application topically 2 (two) times daily. 03/08/19   [name], MD  doxycycline (VIBRA-TABS) 100 MG tablet Take 1 tablet (100 mg total) by mouth 2 (two) times daily. 04/21/19   [name], MD  fluticasone Asencion Islam) 50 MCG/ACT nasal spray Place 2 sprays into both nostrils daily. 04/21/19   [name], MD  losartan (COZAAR) 100 MG tablet Take 1 tablet by mouth once daily 01/25/19   [name], MD  montelukast (SINGULAIR) 10 MG tablet Take 1 tablet (10 mg total) by mouth at bedtime. 06/09/18   [name], MD  rivaroxaban (XARELTO) 20 MG TABS tablet TAKE 1 TABLET BY MOUTH ONCE DAILY WITH SUPPER 03/01/19   Minus Breeding, MD  venlafaxine XR (EFFEXOR-XR) 75 MG 24 hr capsule Take 1 capsule by mouth once daily 03/07/19   [name], MD    Allergies    Cefaclor, Codeine, Hydrocodone, Levaquin [levofloxacin], Nexium [esomeprazole magnesium], Penicillins, and Sulfa antibiotics  Review of Systems   Review of Systems  Constitutional: Negative for chills, diaphoresis and fever.  HENT: Negative for sore throat.   Eyes: Negative for discharge.  Respiratory: Positive for cough. Negative for shortness of breath.   Cardiovascular: Negative for chest pain, palpitations and leg swelling.  Gastrointestinal: Negative for abdominal pain.  Genitourinary: Negative for difficulty urinating.  Musculoskeletal: Negative for arthralgias.  Neurological: Negative for dizziness.  Psychiatric/Behavioral: Negative for agitation.  All other systems reviewed and are negative.   Physical  Exam Updated Vital Signs BP 136/63   Pulse (!) 59   Temp 99.5 F (37.5 C) (Oral)   Resp 16   SpO2 98%   Physical Exam Vitals and nursing note reviewed.  Constitutional:      General: She is not in acute distress.    Appearance: Normal appearance.  HENT:     Head: Normocephalic and atraumatic.     Nose: Nose normal.  Eyes:     Conjunctiva/sclera: Conjunctivae normal.     Pupils: Pupils are equal, round, and reactive to light.  Cardiovascular:     Rate and Rhythm: Normal rate and regular rhythm.     Pulses: Normal pulses.     Heart sounds: Normal heart sounds.  Pulmonary:     Effort: Pulmonary effort is normal.  Breath sounds: Normal breath sounds.  Abdominal:     General: Abdomen is flat. Bowel sounds are normal.     Tenderness: There is no abdominal tenderness. There is no guarding or rebound.  Musculoskeletal:        General: No tenderness. Normal range of motion.     Cervical back: Neck supple.     Right lower leg: No edema.     Left lower leg: No edema.  Skin:    General: Skin is warm and dry.     Capillary Refill: Capillary refill takes less than 2 seconds.  Neurological:     General: No focal deficit present.     Mental Status: She is alert.     Deep Tendon Reflexes: Reflexes normal.  Psychiatric:        Mood and Affect: Mood normal.        Behavior: Behavior normal.     ED Results / Procedures / Treatments   Labs (all labs ordered are listed, but only abnormal results are displayed) Results for orders placed or performed during the hospital encounter of 05/31/19  CBC with Differential/Platelet  Result Value Ref Range   WBC 4.9 4.0 - 10.5 K/uL   RBC 4.75 3.87 - 5.11 MIL/uL   Hemoglobin 13.3 12.0 - 15.0 g/dL   HCT 42.3 36.0 - 46.0 %   MCV 89.1 80.0 - 100.0 fL   MCH 28.0 26.0 - 34.0 pg   MCHC 31.4 30.0 - 36.0 g/dL   RDW 13.8 11.5 - 15.5 %   Platelets 226 150 - 400 K/uL   nRBC 0.0 0.0 - 0.2 %   Neutrophils Relative % 69 %   Neutro Abs 3.4 1.7 -  7.7 K/uL   Lymphocytes Relative 15 %   Lymphs Abs 0.8 0.7 - 4.0 K/uL   Monocytes Relative 11 %   Monocytes Absolute 0.5 0.1 - 1.0 K/uL   Eosinophils Relative 4 %   Eosinophils Absolute 0.2 0.0 - 0.5 K/uL   Basophils Relative 1 %   Basophils Absolute 0.0 0.0 - 0.1 K/uL   Immature Granulocytes 0 %   Abs Immature Granulocytes 0.01 0.00 - 0.07 K/uL  Basic metabolic panel  Result Value Ref Range   Sodium 141 135 - 145 mmol/L   Potassium 3.8 3.5 - 5.1 mmol/L   Chloride 106 98 - 111 mmol/L   CO2 28 22 - 32 mmol/L   Glucose, Bld 163 (H) 70 - 99 mg/dL   BUN 11 8 - 23 mg/dL   Creatinine, Ser 0.79 0.44 - 1.00 mg/dL   Calcium 8.7 (L) 8.9 - 10.3 mg/dL   GFR calc non Af Amer >60 >60 mL/min   GFR calc Af Amer >60 >60 mL/min   Anion gap 7 5 - 15  Troponin I (High Sensitivity)  Result Value Ref Range   Troponin I (High Sensitivity) 2 <18 ng/L   DG Chest Portable 1 View  Result Date: 05/31/2019 CLINICAL DATA:  Chest pain. EXAM: PORTABLE CHEST 1 VIEW COMPARISON:  February 21, 2016 FINDINGS: There is no evidence of an acute infiltrate, pleural effusion or pneumothorax. The heart size and mediastinal contours are within normal limits. Multilevel degenerative changes seen throughout the thoracic spine. IMPRESSION: No active disease. Electronically Signed   By: Virgina Norfolk M.D.   On: 05/31/2019 23:29    EKG EKG Interpretation  Date/Time:  Tuesday May 31 2019 23:35:33 EST Ventricular Rate:  70 PR Interval:    QRS Duration: 92 QT Interval:  422 QTC Calculation: 456 R Axis:   -2 Text Interpretation: Sinus rhythm Confirmed by Randal Buba, Jameisha Stofko ([id number]) on 05/31/2019 11:51:39 PM   Radiology DG Chest Portable 1 View  Result Date: 05/31/2019 CLINICAL DATA:  Chest pain. EXAM: PORTABLE CHEST 1 VIEW COMPARISON:  February 21, 2016 FINDINGS: There is no evidence of an acute infiltrate, pleural effusion or pneumothorax. The heart size and mediastinal contours are within normal limits. Multilevel  degenerative changes seen throughout the thoracic spine. IMPRESSION: No active disease. Electronically Signed   By: Virgina Norfolk M.D.   On: 05/31/2019 23:29    Procedures Procedures (including critical care time)  Medications Ordered in ED Medications  acetaminophen (TYLENOL) tablet 1,000 mg (1,000 mg Oral Given 06/01/19 [id number])    ED Course  I have reviewed the triage vital signs and the nursing notes.  Pertinent labs & imaging results that were available during my care of the patient were reviewed by me and considered in my medical decision making (see chart for details).    Normal Oxygen saturation. Symptoms are not consistent with cardiac etiology.  HEART score is 1. Very low risk for mace.  Normal xray on full dose anticoagulation.  I highly doubt PE on full dose anticoagulation.  Continue home isolation.  Patient is stable for discharge with close follow up  Shannon Navarro was evaluated in Emergency Department on 06/01/2019 for the symptoms described in the history of present illness. She was evaluated in the context of the global COVID-19 pandemic, which necessitated consideration that the patient might be at risk for infection with the SARS-CoV-2 virus that causes COVID-19. Institutional protocols and algorithms that pertain to the evaluation of patients at risk for COVID-19 are in a state of rapid change based on information released by regulatory bodies including the CDC and federal and state organizations. These policies and algorithms were followed during the patient's care in the ED.  Final Clinical Impression(s) / ED Diagnoses Return for intractable cough, coughing up blood,fevers >100.4 unrelieved by medication, shortness of breath, intractable vomiting, chest pain, shortness of breath, weakness,numbness, changes in speech, facial asymmetry,abdominal pain, passing out,Inability to tolerate liquids or food, cough, altered mental status or any concerns. No signs of  systemic illness or infection. The patient is nontoxic-appearing on exam and vital signs are within normal limits.   I have reviewed the triage vital signs and the nursing notes. Pertinent labs &imaging results that were available during my care of the patient were reviewed by me and considered in my medical decision making (see chart for details).  After history, exam, and medical workup I feel the patient has been appropriately medically screened and is safe for discharge home. Pertinent diagnoses were discussed with the patient. Patient was given returnprecautions   Marisal Swarey, MD 06/01/19 Dorann Lodge, Joeangel Jeanpaul, MD 06/01/19 [id number]

## 2019-06-01 NOTE — Telephone Encounter (Signed)
    Patient calling to report she is Covid19 positive. She would like advice on what medication can help with sinus drainage.  Virtual appointment scheduled for 01/22  Please call [phone]

## 2019-06-01 NOTE — Discharge Instructions (Addendum)
Person Under Monitoring Name: Shannon Navarro  Location: [place] [address]   Infection Prevention Recommendations for Individuals Confirmed to have, or Being Evaluated for, 2019 Novel Coronavirus (COVID-19) Infection Who Receive Care at Home  Individuals who are confirmed to have, or are being evaluated for, COVID-19 should follow the prevention steps below until a healthcare provider or local or state health department says they can return to normal activities.  Stay home except to get medical care You should restrict activities outside your home, except for getting medical care. Do not go to work, school, or public areas, and do not use public transportation or taxis.  Call ahead before visiting your doctor Before your medical appointment, call the healthcare provider and tell them that you have, or are being evaluated for, COVID-19 infection. This will help the healthcare provider's office take steps to keep other people from getting infected. Ask your healthcare provider to call the local or state health department.  Monitor your symptoms Seek prompt medical attention if your illness is worsening (e.g., difficulty breathing). Before going to your medical appointment, call the healthcare provider and tell them that you have, or are being evaluated for, COVID-19 infection. Ask your healthcare provider to call the local or state health department.  Wear a facemask You should wear a facemask that covers your nose and mouth when you are in the same room with other people and when you visit a healthcare provider. People who live with or visit you should also wear a facemask while they are in the same room with you.  Separate yourself from other people in your home As much as possible, you should stay in a different room from other people in your home. Also, you should use a separate bathroom, if available.  Avoid sharing household items You should not  share dishes, drinking glasses, cups, eating utensils, towels, bedding, or other items with other people in your home. After using these items, you should wash them thoroughly with soap and water.  Cover your coughs and sneezes Cover your mouth and nose with a tissue when you cough or sneeze, or you can cough or sneeze into your sleeve. Throw used tissues in a lined trash can, and immediately wash your hands with soap and water for at least 20 seconds or use an alcohol-based hand rub.  Wash your [facility] your hands often and thoroughly with soap and water for at least 20 seconds. You can use an alcohol-based hand sanitizer if soap and water are not available and if your hands are not visibly dirty. Avoid touching your eyes, nose, and mouth with unwashed hands.   Prevention Steps for Caregivers and Household Members of Individuals Confirmed to have, or Being Evaluated for, COVID-19 Infection Being Cared for in the Home  If you live with, or provide care at home for, a person confirmed to have, or being evaluated for, COVID-19 infection please follow these guidelines to prevent infection:  Follow healthcare provider's instructions Make sure that you understand and can help the patient follow any healthcare provider instructions for all care.  Provide for the patient's basic needs You should help the patient with basic needs in the home and provide support for getting groceries, prescriptions, and other personal needs.  Monitor the patient's symptoms If they are getting sicker, call his or her medical provider and tell them that the patient has, or is being evaluated for, COVID-19 infection. This will help the healthcare provider's  office take steps to keep other people from getting infected. Ask the healthcare provider to call the local or state health department.  Limit the number of people who have contact with the patient If possible, have only one caregiver for the  patient. Other household members should stay in another home or place of residence. If this is not possible, they should stay in another room, or be separated from the patient as much as possible. Use a separate bathroom, if available. Restrict visitors who do not have an essential need to be in the home.  Keep older adults, very young children, and other sick people away from the patient Keep older adults, very young children, and those who have compromised immune systems or chronic health conditions away from the patient. This includes people with chronic heart, lung, or kidney conditions, diabetes, and cancer.  Ensure good ventilation Make sure that shared spaces in the home have good air flow, such as from an air conditioner or an opened window, weather permitting.  Wash your hands often Wash your hands often and thoroughly with soap and water for at least 20 seconds. You can use an alcohol based hand sanitizer if soap and water are not available and if your hands are not visibly dirty. Avoid touching your eyes, nose, and mouth with unwashed hands. Use disposable paper towels to dry your hands. If not available, use dedicated cloth towels and replace them when they become wet.  Wear a facemask and gloves Wear a disposable facemask at all times in the room and gloves when you touch or have contact with the patient's blood, body fluids, and/or secretions or excretions, such as sweat, saliva, sputum, nasal mucus, vomit, urine, or feces.  Ensure the mask fits over your nose and mouth tightly, and do not touch it during use. Throw out disposable facemasks and gloves after using them. Do not reuse. Wash your hands immediately after removing your facemask and gloves. If your personal clothing becomes contaminated, carefully remove clothing and launder. Wash your hands after handling contaminated clothing. Place all used disposable facemasks, gloves, and other waste in a lined container before  disposing them with other household waste. Remove gloves and wash your hands immediately after handling these items.  Do not share dishes, glasses, or other household items with the patient Avoid sharing household items. You should not share dishes, drinking glasses, cups, eating utensils, towels, bedding, or other items with a patient who is confirmed to have, or being evaluated for, COVID-19 infection. After the person uses these items, you should wash them thoroughly with soap and water.  Wash laundry thoroughly Immediately remove and wash clothes or bedding that have blood, body fluids, and/or secretions or excretions, such as sweat, saliva, sputum, nasal mucus, vomit, urine, or feces, on them. Wear gloves when handling laundry from the patient. Read and follow directions on labels of laundry or clothing items and detergent. In general, wash and dry with the warmest temperatures recommended on the label.  Clean all areas the individual has used often Clean all touchable surfaces, such as counters, tabletops, doorknobs, bathroom fixtures, toilets, phones, keyboards, tablets, and bedside tables, every day. Also, clean any surfaces that may have blood, body fluids, and/or secretions or excretions on them. Wear gloves when cleaning surfaces the patient has come in contact with. Use a diluted bleach solution (e.g., dilute bleach with 1 part bleach and 10 parts water) or a household disinfectant with a label that says EPA-registered for coronaviruses. To make a  bleach solution at home, add 1 tablespoon of bleach to 1 quart (4 cups) of water. For a larger supply, add  cup of bleach to 1 gallon (16 cups) of water. Read labels of cleaning products and follow recommendations provided on product labels. Labels contain instructions for safe and effective use of the cleaning product including precautions you should take when applying the product, such as wearing gloves or eye protection and making sure you  have good ventilation during use of the product. Remove gloves and wash hands immediately after cleaning.  Monitor yourself for signs and symptoms of illness Caregivers and household members are considered close contacts, should monitor their health, and will be asked to limit movement outside of the home to the extent possible. Follow the monitoring steps for close contacts listed on the symptom monitoring form.   ? If you have additional questions, contact your local health department or call the epidemiologist on call at [phone] (available 24/7). ? This guidance is subject to change. For the most up-to-date guidance from [facility], please refer to their website: [email or web address].pl

## 2019-06-02 NOTE — Telephone Encounter (Signed)
Notified pt w/MD response.../lmb 

## 2019-06-02 NOTE — Telephone Encounter (Signed)
Keep taking singulair, flonase, zyrtec. She can also try mucinex over the counter or dayquil/nyquil

## 2019-06-03 ENCOUNTER — Ambulatory Visit: Payer: BC Managed Care – PPO | Admitting: Internal Medicine

## 2019-06-06 ENCOUNTER — Ambulatory Visit (INDEPENDENT_AMBULATORY_CARE_PROVIDER_SITE_OTHER): Payer: BC Managed Care – PPO | Admitting: Internal Medicine

## 2019-06-06 ENCOUNTER — Encounter: Payer: Self-pay | Admitting: Internal Medicine

## 2019-06-06 DIAGNOSIS — U071 COVID-19: Secondary | ICD-10-CM | POA: Diagnosis not present

## 2019-06-06 MED ORDER — PREDNISONE 20 MG PO TABS: 40.0000 mg | ORAL_TABLET | Freq: Every day | ORAL | 0 refills | Status: DC

## 2019-06-06 NOTE — Progress Notes (Signed)
Virtual Visit via Video Note  I connected with Shannon Navarro on 06/06/19 at 10:00 AM EST by a video enabled telemedicine application and verified that I am speaking with the correct person using two identifiers.  The patient and the provider were at separate locations throughout the entire encounter.   I discussed the limitations of evaluation and management by telemedicine and the availability of in person appointments. The patient expressed understanding and agreed to proceed. The patient and the provider were the only parties present for the visit unless noted in HPI below.  History of Present Illness: The patient is a 64 y.o. female with visit for covid-19 positive. Started with positive test on 05/30/19. Had ER visit for evaluation on 05/31/19 for pain when coughing. Ruled out for ACS and on NOAC so deemed low risk for PE. She is still having fevers and chills and cough and congestion. Some vomiting as well and diarrhea. Denies SOB. Overall it is not improving and worsening slightly in the last day or two. Has tried zinc and vitamin c otc. BP normal and pulse ox normal.   Observations/Objective: Appearance: appears sick, breathing appears normal, some non-productive coughing during visit, casual grooming, abdomen does not appear distended, throat not well visualized, memory normal, mental status is A and O times 3  Assessment and Plan: See problem oriented charting  Follow Up Instructions: rx prednisone, monitor closely for status, call or seek care for O2 levels <90% or new problems breathing  I discussed the assessment and treatment plan with the patient. The patient was provided an opportunity to ask questions and all were answered. The patient agreed with the plan and demonstrated an understanding of the instructions.   The patient was advised to call back or seek an in-person evaluation if the symptoms worsen or if the condition fails to improve as anticipated.  [name],  MD

## 2019-06-06 NOTE — Assessment & Plan Note (Signed)
Rx prednisone. Advised to continue with supportive care and increase fluids due to vomiting and diarrhea. Continue singulair, flonase, zyrtec for congestion. Call back or seek care for new breathing problems or O2 levels <90%.

## 2019-06-08 ENCOUNTER — Encounter: Payer: Self-pay | Admitting: Internal Medicine

## 2019-06-08 ENCOUNTER — Ambulatory Visit (INDEPENDENT_AMBULATORY_CARE_PROVIDER_SITE_OTHER): Payer: BC Managed Care – PPO | Admitting: Internal Medicine

## 2019-06-08 ENCOUNTER — Telehealth: Payer: Self-pay

## 2019-06-08 ENCOUNTER — Other Ambulatory Visit: Payer: Self-pay

## 2019-06-08 ENCOUNTER — Ambulatory Visit (INDEPENDENT_AMBULATORY_CARE_PROVIDER_SITE_OTHER): Payer: BC Managed Care – PPO | Admitting: Medical

## 2019-06-08 VITALS — BP 148/90 | HR 59 | Temp 98.5°F | Ht 66.0 in | Wt 274.0 lb

## 2019-06-08 DIAGNOSIS — I10 Essential (primary) hypertension: Secondary | ICD-10-CM | POA: Diagnosis not present

## 2019-06-08 DIAGNOSIS — I480 Paroxysmal atrial fibrillation: Secondary | ICD-10-CM

## 2019-06-08 DIAGNOSIS — R001 Bradycardia, unspecified: Secondary | ICD-10-CM

## 2019-06-08 DIAGNOSIS — U071 COVID-19: Secondary | ICD-10-CM

## 2019-06-08 DIAGNOSIS — G4733 Obstructive sleep apnea (adult) (pediatric): Secondary | ICD-10-CM

## 2019-06-08 DIAGNOSIS — R059 Cough, unspecified: Secondary | ICD-10-CM

## 2019-06-08 DIAGNOSIS — R05 Cough: Secondary | ICD-10-CM

## 2019-06-08 DIAGNOSIS — R7301 Impaired fasting glucose: Secondary | ICD-10-CM

## 2019-06-08 MED ORDER — PROMETHAZINE-DM 6.25-15 MG/5ML PO SYRP: 5.0000 mL | ORAL_SOLUTION | Freq: Four times a day (QID) | ORAL | 0 refills | Status: DC | PRN

## 2019-06-08 NOTE — Telephone Encounter (Signed)
Noted  

## 2019-06-08 NOTE — Telephone Encounter (Signed)
New message    The patient has COVID not doing well. C/o Temp 100.8  No recent emergency visit .  Virtual visit today @ 10:20am

## 2019-06-08 NOTE — Progress Notes (Signed)
[facility]   Subjective:  Sh[facility] is a 64 y.o. female who presents for respiratory illness.    PCP:  Shannon Koch, MD  She had 2 recent virtual consults with PCP.   Tested + for covid 05/30/2019.   First symptome were 05/29/2019 with bad cough.   Next day lost taste but not smell.  Had emergency dept visit 05/31/2019 for pain with coughing, elevated pulse and BP.  At ED felt she didn't have ACS or PE.  As of 2 days ago was still having fever, chills, cough, congestion, some vomiting, some diarrhea.   At that time did not feel SOB.   Using zinc, vitamin C, vitamin D, and prednisone was added 2 days by PCP.  Was on mucinex.    In the last 2 days she notes tightness in chest,, sleeping on right side, using her CPAP, is in her recliner.   Has avoided lying flat.  Fever would be worse in afternoon, none in the morning.  Highest temp 100 up until now.  But this morning temp 101.  Felt weak yesterday.  Hasn't coughed as much today.     Using pulse oximeter at night.  Has been running 96 % room air, but this morning had seen 93%.  Tylenol helps break the fever, had hoarse voice and felt soaked today before fever broke.   Most of the time pulse is staying 96%, but saw a few lower numbers.   Hasn't had headache or abdominal pain.   Stools have sometimes been loose the last few day.   Has had bad sore throat.    No calve pain or swelling.  No prior DVT or PE.    Compliant with her regular medicaiton.   Not a smoker.  No other aggravating or relieving factors.  No other c/o.  Past Medical History:  Diagnosis Date  . Allergic rhinitis   . Anticoagulant long-term use    XARELTO  . ANXIETY   . Endometrial polyp   . Endometrial polyp   . Fatty liver 06/23/07  . GERD   . Hiatal hernia   . History of esophageal stricture    mainly due to gerd  . History of exercise stress test 06/13/2015   PER [facility] NOTE -- NO EVIDENCE ISCHEMIA  . HYPERTENSION   .  Impaired fasting glucose   . Migraine    hx of migraines none recent  . OSA on CPAP followed by Shannon [facility]   per study 02-09-2012  severe osa w/ AHI 33.8  . Paroxysmal atrial fibrillation Shannon [facility]) cardioloigst-  Shannon [facility]   dx 02-21-2016 at ED  . Pseudotumor cerebri 2007  . Vitamin D deficiency 07/2013    ROS as in subjective   Objective: BP (!) 148/90   Pulse (!) 59   Temp 98.5 F (36.9 C)   Ht 5\' 6"  (1.676 m)   Wt 274 lb (124.3 kg)   SpO2 97%   BMI 44.22 kg/m   General appearance: Alert, WD/WN, no distress, mildly ill appearing, coughing a bit, obese white female                                                 Head: no sinus tenderness  Eyes: conjunctiva normal, corneas clear, PERRLA                         Nose: septum midline, turbinates WNL, no discharge             Mouth/throat: mildly dry MM, tongue normal, no pharyngeal erythema                           Neck: supple, no adenopathy, no thyromegaly, non tender                          Heart: RRR, normal S1, S2, no murmurs                         Lungs: decreased lower fields with coarse sounds, no wheezes, rales Ext - no edema, no calve tenderness or asymmetry UE and LE pulses WNL Skin: mild skin tenting of dorsal hands, warm, no rash        Assessment  Encounter Diagnoses  Name Primary?  . COVID-19 Yes  . Cough   . Essential hypertension   . PAF (paroxysmal atrial fibrillation) ([facility])   . OSA (obstructive sleep apnea)   . Impaired fasting glucose   . Bradycardia       Plan: Discussed concerns, reviewed ED records from 05/31/2019 visit.   Reviewed chest xray 05/31/2019 was normal. Reviewed BMET lab 05/31/2019 showing elevated glucose, calcium slightly low CBC on 05/31/2019 was normal, troponin's normal EKG 05/31/2019 unremarkable   Patient Instructions  Your vital signs are stable other than elevated blood pressure.   Recommendations:  Go for chest xray tomorrow after 8am at  [facility]; wear your mask  Increase clear fluids such as water, Pedialyte over the counter, no sugar Gatorade, ice chips, soup  Continue the Vitamin D and Vitamin C  Finish out the prednisone  You can begin the cough medication prescribed this evening as needed, Promethazine DM cough syrup  Hydrate to the point your skin is not tenting and your urine is clear  You can use the Albuterol inhaler, 1-2 puffs three times daily for the next several days to help with cough fits, shortness of breath, wheezing  If your pulse ox level is continue to worsen such as below 93% consistently then either call back your primary care provider or go to the [facility] emergency department  If you have uncontrollable nausea and vomiting, then go to the emergency department  If you start feeling unusual chest pains, unusual heart rate or rhythm, go to the emergency department  Follow up with Shannon. Sharlet [facility] by phone by Friday     Patient was advised to call or return if worse or not improving in the next few days.    Patient voiced understanding of diagnosis, recommendations, and treatment plan.  After visit summary given.   Shannon [facility] was seen today for cough / fever / feeling realy bad / tightness in belly.  Diagnoses and all orders for this visit:  COVID-19  Cough  Essential hypertension  PAF (paroxysmal atrial fibrillation) (HCC)  OSA (obstructive sleep apnea)  Impaired fasting glucose  Bradycardia

## 2019-06-08 NOTE — Assessment & Plan Note (Signed)
Visit scheduled with respiratory clinic to be assessed in person today for worsening symptoms, new SOB mild and cough with ongoing fevers. Advised to take tylenol for fever this morning.

## 2019-06-08 NOTE — Progress Notes (Signed)
Virtual Visit via Video Note  I connected with Shannon [facility]arro on 06/08/19 at 10:20 AM EST by a video enabled telemedicine application and verified that I am speaking with the correct person using two identifiers.  The patient and the provider were at separate locations throughout the entire encounter.   I discussed the limitations of evaluation and management by telemedicine and the availability of in person appointments. The patient expressed understanding and agreed to proceed. The patient and the provider were the only parties present for the visit unless noted in HPI below.  History of Present Illness: The patient is a 64 y.o. female with visit for covid-19 and not feeling well. Started having fever this morning to 100.9 F and having new body aches and chills. She is not having a lot of SOB but oxygen levels are down from 97% to now 93% consistently with resting. Overall she is just feeling like she is declining and getting sicker. Started overall 05/30/19. Has nausea also but no vomiting. Poor appetite but still drinking fluids. Does also have worsening cough, loss of voice new in the last day. Overall it is worsening. Has tried mucinex, flonase, zyrtec, singulair. Started prednisone 2 days ago and has not seen any improvement with that.   Observations/Objective: Appearance: normal, breathing appears normal, coughing during visit, voice hoarse, casual grooming, abdomen does not appear distended, throat not well visualized, memory normal, mental status is A and O times 3  Assessment and Plan: See problem oriented charting  Follow Up Instructions: evaluate at respiratory clinic to assess lungs and for declining oxygen levels as well as ongoing fevers.  I discussed the assessment and treatment plan with the patient. The patient was provided an opportunity to ask questions and all were answered. The patient agreed with the plan and demonstrated an understanding of the instructions.   The patient  was advised to call back or seek an in-person evaluation if the symptoms worsen or if the condition fails to improve as anticipated.  [name], MD

## 2019-06-08 NOTE — Patient Instructions (Addendum)
Your vital signs are stable other than elevated blood pressure.   Recommendations:  Go for chest xray tomorrow after 8am at [facility]; wear your mask  Increase clear fluids such as water, Pedialyte over the counter, no sugar Gatorade, ice chips, soup  Continue the Vitamin D and Vitamin C  Finish out the prednisone  You can begin the cough medication prescribed this evening as needed, Promethazine DM cough syrup  Hydrate to the point your skin is not tenting and your urine is clear  You can use the Albuterol inhaler, 1-2 puffs three times daily for the next several days to help with cough fits, shortness of breath, wheezing  If your pulse ox level is continue to worsen such as below 93% consistently then either call back your primary care provider or go to the hospital emergency department  If you have uncontrollable nausea and vomiting, then go to the emergency department  If you start feeling unusual chest pains, unusual heart rate or rhythm, go to the emergency department  Follow up with Dr. Sharlet Salina by phone by Friday

## 2019-06-09 ENCOUNTER — Ambulatory Visit (HOSPITAL_COMMUNITY)
Admission: RE | Admit: 2019-06-09 | Discharge: 2019-06-09 | Disposition: A | Discharge status: Home / Self Care | Payer: BC Managed Care – PPO | Source: Ambulatory Visit | Attending: Medical | Admitting: Medical

## 2019-06-09 ENCOUNTER — Other Ambulatory Visit: Payer: Self-pay

## 2019-06-09 DIAGNOSIS — R001 Bradycardia, unspecified: Secondary | ICD-10-CM | POA: Insufficient documentation

## 2019-06-09 DIAGNOSIS — R05 Cough: Secondary | ICD-10-CM | POA: Diagnosis present

## 2019-06-09 DIAGNOSIS — G4733 Obstructive sleep apnea (adult) (pediatric): Secondary | ICD-10-CM | POA: Insufficient documentation

## 2019-06-09 DIAGNOSIS — R7301 Impaired fasting glucose: Secondary | ICD-10-CM | POA: Diagnosis present

## 2019-06-09 DIAGNOSIS — U071 COVID-19: Secondary | ICD-10-CM | POA: Diagnosis present

## 2019-06-09 DIAGNOSIS — I480 Paroxysmal atrial fibrillation: Secondary | ICD-10-CM

## 2019-06-09 DIAGNOSIS — R059 Cough, unspecified: Secondary | ICD-10-CM

## 2019-06-09 DIAGNOSIS — I10 Essential (primary) hypertension: Secondary | ICD-10-CM | POA: Insufficient documentation

## 2019-06-10 ENCOUNTER — Encounter: Payer: Self-pay | Admitting: Internal Medicine

## 2019-06-10 ENCOUNTER — Ambulatory Visit (INDEPENDENT_AMBULATORY_CARE_PROVIDER_SITE_OTHER): Payer: BC Managed Care – PPO | Admitting: Internal Medicine

## 2019-06-10 DIAGNOSIS — R001 Bradycardia, unspecified: Secondary | ICD-10-CM

## 2019-06-10 DIAGNOSIS — U071 COVID-19: Secondary | ICD-10-CM

## 2019-06-10 MED ORDER — PREDNISONE 20 MG PO TABS: 20.0000 mg | ORAL_TABLET | Freq: Every day | ORAL | 0 refills | Status: DC

## 2019-06-10 MED ORDER — DOXYCYCLINE HYCLATE 100 MG PO TABS: 100.0000 mg | ORAL_TABLET | Freq: Two times a day (BID) | ORAL | 0 refills | Status: DC

## 2019-06-10 NOTE — Assessment & Plan Note (Signed)
Hold amlodipine for 1 week as she is having low HR and low BP.

## 2019-06-10 NOTE — Assessment & Plan Note (Signed)
With pneumonia. Needs in office follow up in 4 weeks to reassess and CXR. Refill prednisone at 20 mg daily for another 5-6 days. Rx doxycycline to cover for sinus infection.

## 2019-06-10 NOTE — Progress Notes (Signed)
Virtual Visit via Video Note  I connected with Shannon Navarro on 06/10/19 at  8:40 AM EST by a video enabled telemedicine application and verified that I am speaking with the correct person using two identifiers.  The patient and the provider were at separate locations throughout the entire encounter.   I discussed the limitations of evaluation and management by telemedicine and the availability of in person appointments. The patient expressed understanding and agreed to proceed. The patient and the provider were the only parties present for the visit unless noted in HPI below.  History of Present Illness: The patient is a 64 y.o. female with visit for covid-19 positive. Started 05/30/19 with positive test and overall was feeling okay initially. In the last 2-3 days she was having more SOB and cough and fevers. She was started on prednisone pack and is still taking that. Has seen respiratory clinic and x-ray consistent with pneumonia related to covid-19 bilateral lungs. She did get an inhaler and is using that TID as instructed by them. She is feeling some better with this and has taken tylenol. Her fever finally did break and she is feeling some better. She has noticed low HR to mid 40s in the last couple of days. Denies SOB. Still voice very hoarse. Denies worsening. Overall it is improving gradually. Still having a lot of sinus pain and congestion and wonders if she has concurrent sinus infection which she gets often.  Observations/Objective: Appearance: normal, voice hoarse, no respiratory distress or dyspnea, breathing appears normal, casual grooming, abdomen does not appear distended, throat not visualized well, memory normal, mental status is A and O times 3  Assessment and Plan: See problem oriented charting  Follow Up Instructions: Hold amlodipine for 1 week, continue prednisone another 5 days, rx doxycycline to cover for possible sinus infection concurrent  I discussed the assessment and  treatment plan with the patient. The patient was provided an opportunity to ask questions and all were answered. The patient agreed with the plan and demonstrated an understanding of the instructions.   The patient was advised to call back or seek an in-person evaluation if the symptoms worsen or if the condition fails to improve as anticipated.  [name], MD

## 2019-06-11 ENCOUNTER — Emergency Department (HOSPITAL_COMMUNITY)
Admission: EM | Admit: 2019-06-11 | Discharge: 2019-06-12 | Disposition: A | Discharge status: Home / Self Care | Payer: BC Managed Care – PPO | Attending: Emergency Medicine | Admitting: Emergency Medicine

## 2019-06-11 ENCOUNTER — Other Ambulatory Visit: Payer: Self-pay

## 2019-06-11 DIAGNOSIS — Z7901 Long term (current) use of anticoagulants: Secondary | ICD-10-CM | POA: Insufficient documentation

## 2019-06-11 DIAGNOSIS — R0602 Shortness of breath: Secondary | ICD-10-CM | POA: Diagnosis present

## 2019-06-11 DIAGNOSIS — E876 Hypokalemia: Secondary | ICD-10-CM | POA: Insufficient documentation

## 2019-06-11 DIAGNOSIS — R509 Fever, unspecified: Secondary | ICD-10-CM | POA: Insufficient documentation

## 2019-06-11 DIAGNOSIS — I10 Essential (primary) hypertension: Secondary | ICD-10-CM | POA: Insufficient documentation

## 2019-06-11 DIAGNOSIS — R001 Bradycardia, unspecified: Secondary | ICD-10-CM | POA: Insufficient documentation

## 2019-06-11 DIAGNOSIS — U071 COVID-19: Secondary | ICD-10-CM

## 2019-06-11 DIAGNOSIS — Z79899 Other long term (current) drug therapy: Secondary | ICD-10-CM | POA: Insufficient documentation

## 2019-06-11 NOTE — ED Provider Notes (Signed)
[facility] EMERGENCY DEPARTMENT Provider Note   CSN: [id number] Arrival date & time: 06/11/19  2312   History Chief Complaint  Patient presents with  . Shortness of Breath  . Bradycardia    Shannon [facility] is a 64 y.o. female.  The history is provided by the patient.  Shortness of Breath She has history of hypertension, sinus bradycardia, paroxysmal atrial fibrillation and comes in tonight because of tightness in her chest and slow heart rate.  She was diagnosed with COVID-19 3 weeks ago and was doing generally well until about 1 week ago when she started having some increasing tightness in her chest and higher fever.  She saw her primary care physician who started her on steroids about 5 days ago.  3 days ago, she saw her pulmonologist to thought she had pneumonia and started her on doxycycline.  She states she is not coughing but has some intermittent tight feeling in her chest.  She has been running low-grade fevers no higher than 100.  She initially lost her sense of smell and taste, but they have returned.  She denies nausea, vomiting, diarrhea.  She denies arthralgias or myalgias.  Tonight, she used her pulse oximeter which showed oxygen saturation 98% but heart rate in the 40s.  She called her physician who recommended that she come to the emergency department.  She denies chest pain, heaviness, pressure.  Past Medical History:  Diagnosis Date  . Allergic rhinitis   . Anticoagulant long-term use    XARELTO  . ANXIETY   . Endometrial polyp   . Endometrial polyp   . Fatty liver 06/23/07  . GERD   . Hiatal hernia   . History of esophageal stricture    mainly due to gerd  . History of exercise stress test 06/13/2015   PER DR HOCHREIN NOTE -- NO EVIDENCE ISCHEMIA  . HYPERTENSION   . Impaired fasting glucose   . Migraine    hx of migraines none recent  . OSA on CPAP followed by dr Halford Chessman   per study 02-09-2012  severe osa w/ AHI 33.8  . Paroxysmal atrial  fibrillation [facility]) cardioloigst-  dr hochrein   dx 02-21-2016 at ED  . Pseudotumor cerebri 2007  . Vitamin D deficiency 07/2013    Patient Active Problem List   Diagnosis Date Noted  . COVID-19 06/06/2019  . Rash 03/08/2019  . Right leg pain 11/11/2018  . Bradycardia 06/08/2018  . PAF (paroxysmal atrial fibrillation) ([place]) 03/05/2017  . Acute sinusitis 10/16/2015  . Sinus bradycardia 04/11/2015  . Routine general medical examination at a health care facility 06/15/2014  . Vitamin D deficiency   . OSA (obstructive sleep apnea) 01/16/2012  . Essential hypertension 06/21/2010  . Impaired fasting glucose 12/10/2009  . Anxiety state 10/05/2009  . GERD 10/05/2009  . Morbid obesity ([place]) 10/03/2008  . Allergic rhinitis 10/03/2008    Past Surgical History:  Procedure Laterality Date  . COLONOSCOPY    . colonscopy  2007   normal  . DILATATION & CURETTAGE/HYSTEROSCOPY WITH MYOSURE N/A 03/26/2017   Procedure: DILATATION & CURETTAGE/HYSTEROSCOPY WITH MYOSURE;  Surgeon: Paula Compton, MD;  Location: [place];  Service: Gynecology;  Laterality: N/A;  . DILATION AND CURETTAGE OF UTERUS  2010  . LAPAROSCOPIC HYSTERECTOMY Bilateral 06/18/2017   Procedure: HYSTERECTOMY TOTAL LAPAROSCOPIC;  Surgeon: Paula Compton, MD;  Location[facility];  Service: Gynecology;  Laterality: Bilateral;  OUT PT IN BED, needs extra time MD request 2.5hrs  in OR  . right knee arthroscopy  03/2013  . TRANSTHORACIC ECHOCARDIOGRAM  03/11/2016   ef 60-65%/  mild MR/ trivial PR and TR  . TUBAL LIGATION    . UPPER GASTROINTESTINAL ENDOSCOPY       OB History   No obstetric history on file.     Family History  Problem Relation Age of Onset  . Breast cancer Mother   . Asthma Mother   . Hypertension Mother   . Prostate cancer Father   . Aortic stenosis Father   . Hypertension Father   . Colon polyps Father   . Heart disease Father   . Diabetes Father   . Breast cancer  Maternal Aunt   . Breast cancer Maternal Aunt   . Colon cancer Neg Hx   . Esophageal cancer Neg Hx   . Stomach cancer Neg Hx   . Rectal cancer Neg Hx     Social History   Tobacco Use  . Smoking status: Never Smoker  . Smokeless tobacco: Never Used  . Tobacco comment: Married lives with spouse + 2 daughters  Substance Use Topics  . Alcohol use: No  . Drug use: No    Home Medications Prior to Admission medications   Medication Sig Start Date End Date Taking? Authorizing Provider  amLODipine (NORVASC) 5 MG tablet Take 1 tablet by mouth once daily 05/02/19   Minus Breeding, MD  clotrimazole-betamethasone (LOTRISONE) cream Apply 1 application topically 2 (two) times daily. 03/08/19   [name], MD  doxycycline (VIBRA-TABS) 100 MG tablet Take 1 tablet (100 mg total) by mouth 2 (two) times daily. 06/10/19   [name], MD  losartan (COZAAR) 100 MG tablet Take 1 tablet by mouth once daily 01/25/19   [name], MD  predniSONE (DELTASONE) 20 MG tablet Take 1 tablet (20 mg total) by mouth daily with breakfast. 06/10/19   [name], MD  promethazine-dextromethorphan (PROMETHAZINE-DM) 6.25-15 MG/5ML syrup Take 5 mLs by mouth 4 (four) times daily as needed for cough. 06/08/19   Tysinger, Camelia Eng, PA-C  rivaroxaban (XARELTO) 20 MG TABS tablet TAKE 1 TABLET BY MOUTH ONCE DAILY WITH SUPPER 03/01/19   Minus Breeding, MD  venlafaxine XR (EFFEXOR-XR) 75 MG 24 hr capsule Take 1 capsule by mouth once daily 03/07/19   [name], MD    Allergies    Cefaclor, Codeine, Hydrocodone, Levaquin [levofloxacin], Nexium [esomeprazole magnesium], Penicillins, and Sulfa antibiotics  Review of Systems   Review of Systems  Respiratory: Positive for shortness of breath.   All other systems reviewed and are negative.   Physical Exam Updated Vital Signs BP 138/67 (BP Location: Left Arm)   Pulse (!) 55   Temp 98 F (36.7 C)   Resp 20   Ht 5\' 6"  (1.676  m)   Wt 124.3 kg   SpO2 99%   BMI 44.22 kg/m   Physical Exam Vitals and nursing note reviewed.   64 year old female, resting comfortably and in no acute distress. Vital signs are significant for slow heart rate. Oxygen saturation is 99%, which is normal. Head is normocephalic and atraumatic. PERRLA, EOMI. Oropharynx is clear. Neck is nontender and supple without adenopathy or JVD. Back is nontender and there is no CVA tenderness. Lungs are clear without rales, wheezes, or rhonchi. Chest is nontender. Heart has regular rate and rhythm without murmur. Abdomen is soft, flat, nontender without masses or hepatosplenomegaly and peristalsis is normoactive. Extremities have no cyanosis or edema, full  range of motion is present. Skin is warm and dry without rash. Neurologic: Mental status is normal, cranial nerves are intact, there are no motor or sensory deficits.  ED Results / Procedures / Treatments   Labs (all labs ordered are listed, but only abnormal results are displayed) Labs Reviewed  COMPREHENSIVE METABOLIC PANEL - Abnormal; Notable for the following components:      Result Value   Potassium 3.4 (*)    Glucose, Bld 114 (*)    Total Protein 6.4 (*)    Albumin 3.0 (*)    All other components within normal limits  CBC WITH DIFFERENTIAL/PLATELET  D-DIMER, QUANTITATIVE (NOT AT [facility])  TROPONIN I (HIGH SENSITIVITY)  TROPONIN I (HIGH SENSITIVITY)    EKG EKG Interpretation  Date/Time:  Saturday June 11 2019 23:14:08 EST Ventricular Rate:  44 PR Interval:    QRS Duration: 96 QT Interval:  519 QTC Calculation: 444 R Axis:   19 Text Interpretation: Junctional rhythm Low voltage, precordial leads When compared with ECG of 05/31/2019, Junctional rhythm has replaced Sinus rhythm Confirmed by Delora Fuel ([id number]) on 06/11/2019 11:18:17 PM   Radiology DG Chest Port 1 View  Result Date: 06/12/2019 CLINICAL DATA:  COVID infection. Shortness of breath. EXAM: PORTABLE CHEST 1 VIEW  COMPARISON:  Radiograph 3 days prior 06/09/2019 FINDINGS: Mild cardiomegaly. Unchanged mediastinal contours. Heterogeneous bilateral lung opacities in a peripheral predominant distribution, right greater than left. Pulmonary opacities have slightly progressed in the right upper lobe from prior. No pulmonary edema, pleural effusion, or pneumothorax. No acute osseous abnormalities are seen. IMPRESSION: Heterogeneous bilateral lung opacities, right greater than left, with slight progression from exam 3 days prior. Pattern consistent with COVID pneumonia. Mild cardiomegaly. Electronically Signed   By: Keith Rake M.D.   On: 06/12/2019 01:49    Procedures Procedures  Medications Ordered in ED Medications  potassium chloride SA (KLOR-CON) CR tablet 40 mEq (has no administration in time range)  albuterol (VENTOLIN HFA) 108 (90 Base) MCG/ACT inhaler 6 puff (6 puffs Inhalation Given 06/12/19 0314)    ED Course  I have reviewed the triage vital signs and the nursing notes.  Pertinent labs & imaging results that were available during my care of the patient were reviewed by me and considered in my medical decision making (see chart for details).  MDM Rules/Calculators/A&P COVID-19 infection with documented pneumonia and currently on doxycycline and prednisone.  Bradycardia tonight which is otherwise asymptomatic.  Oxygen saturation is normal.  Will check screening labs and repeat chest x-ray.  Old records are reviewed confirming recent x-ray showing bilateral infiltrates consistent with COVID-19 pneumonia.  Chest x-ray today is essentially unchanged from recent.  Labs show borderline hypokalemia and she is given a dose of oral potassium.  Heart rate has varied from the mid 40s to mid 50s but patient has been asymptomatic with this.  Tightness in her chest has improved with using albuterol inhaler and she is advised to continue using this at home.  She is felt to be safe for discharge.  Return precautions  discussed.  FATEN DILLING was evaluated in Emergency Department on 06/12/2019 for the symptoms described in the history of present illness. She was evaluated in the context of the global COVID-19 pandemic, which necessitated consideration that the patient might be at risk for infection with the SARS-CoV-2 virus that causes COVID-19. Institutional protocols and algorithms that pertain to the evaluation of patients at risk for COVID-19 are in a state of rapid change based on information released  by regulatory bodies including the CDC and federal and state organizations. These policies and algorithms were followed during the patient's care in the ED.  Final Clinical Impression(s) / ED Diagnoses Final diagnoses:  [id number] virus infection  Sinus bradycardia  Hypokalemia  Chronic anticoagulation    Rx / DC Orders ED Discharge Orders    None       Delora Fuel, MD [phone]

## 2019-06-11 NOTE — ED Triage Notes (Signed)
Pt BIB EMS from home. Pt called PCP, was experiencing shortness of breath & bradycardia, PCP advised pt call 911 & come to ED. COVID+ for 14 days, x-ray showed double pneumonia. Pt experiencing tightness in the chest related to cough. VSS en route HX Afib, HTN, bradycardia

## 2019-06-12 ENCOUNTER — Emergency Department (HOSPITAL_COMMUNITY): Payer: BC Managed Care – PPO

## 2019-06-12 LAB — CBC WITH DIFFERENTIAL/PLATELET
Abs Immature Granulocytes: 0.05 10*3/uL (ref 0.00–0.07)
Basophils Absolute: 0 10*3/uL (ref 0.0–0.1)
Basophils Relative: 1 %
Eosinophils Absolute: 0 10*3/uL (ref 0.0–0.5)
Eosinophils Relative: 1 %
HCT: 38.9 % (ref 36.0–46.0)
Hemoglobin: 12.7 g/dL (ref 12.0–15.0)
Immature Granulocytes: 1 %
Lymphocytes Relative: 31 %
Lymphs Abs: 1.7 10*3/uL (ref 0.7–4.0)
MCH: 27.9 pg (ref 26.0–34.0)
MCHC: 32.6 g/dL (ref 30.0–36.0)
MCV: 85.5 fL (ref 80.0–100.0)
Monocytes Absolute: 0.5 10*3/uL (ref 0.1–1.0)
Monocytes Relative: 10 %
Neutro Abs: 3.2 10*3/uL (ref 1.7–7.7)
Neutrophils Relative %: 56 %
Platelets: 314 10*3/uL (ref 150–400)
RBC: 4.55 MIL/uL (ref 3.87–5.11)
RDW: 13.5 % (ref 11.5–15.5)
WBC: 5.6 10*3/uL (ref 4.0–10.5)
nRBC: 0 % (ref 0.0–0.2)

## 2019-06-12 LAB — TROPONIN I (HIGH SENSITIVITY): Troponin I (High Sensitivity): 5 ng/L (ref <18)

## 2019-06-12 LAB — COMPREHENSIVE METABOLIC PANEL
ALT: 25 U/L (ref 0–44)
AST: 16 U/L (ref 15–41)
Albumin: 3 g/dL — ABNORMAL LOW (ref 3.5–5.0)
Alkaline Phosphatase: 52 U/L (ref 38–126)
Anion gap: 9 (ref 5–15)
BUN: 13 mg/dL (ref 8–23)
CO2: 25 mmol/L (ref 22–32)
Calcium: 9 mg/dL (ref 8.9–10.3)
Chloride: 108 mmol/L (ref 98–111)
Creatinine, Ser: 0.69 mg/dL (ref 0.44–1.00)
GFR calc Af Amer: >60 mL/min (ref >60)
GFR calc non Af Amer: >60 mL/min (ref >60)
Glucose, Bld: 114 mg/dL — ABNORMAL HIGH (ref 70–99)
Potassium: 3.4 mmol/L — ABNORMAL LOW (ref 3.5–5.1)
Sodium: 142 mmol/L (ref 135–145)
Total Bilirubin: 0.8 mg/dL (ref 0.3–1.2)
Total Protein: 6.4 g/dL — ABNORMAL LOW (ref 6.5–8.1)

## 2019-06-12 LAB — D-DIMER, QUANTITATIVE: D-Dimer, Quant: <0.27 ug/mL-FEU (ref 0.00–0.50)

## 2019-06-12 MED ORDER — ALBUTEROL SULFATE HFA 108 (90 BASE) MCG/ACT IN AERS
6.0000 | INHALATION_SPRAY | Freq: Once | RESPIRATORY_TRACT | Status: AC
  Administered 2019-06-12: 6 via RESPIRATORY_TRACT
  Filled 2019-06-12: qty 6.7

## 2019-06-12 MED ORDER — POTASSIUM CHLORIDE CRYS ER 20 MEQ PO TBCR
40.0000 meq | EXTENDED_RELEASE_TABLET | Freq: Once | ORAL | Status: AC
  Administered 2019-06-12: 40 meq via ORAL
  Filled 2019-06-12: qty 2

## 2019-06-12 NOTE — Discharge Instructions (Signed)
Continue your current treatments.  Return if symptoms are getting worse - especially if you are having difficulty breathing.

## 2019-06-12 NOTE — ED Notes (Signed)
Pt SpO2 95-97% while ambulating

## 2019-06-17 ENCOUNTER — Ambulatory Visit (INDEPENDENT_AMBULATORY_CARE_PROVIDER_SITE_OTHER): Payer: BC Managed Care – PPO | Admitting: Internal Medicine

## 2019-06-17 ENCOUNTER — Encounter: Payer: Self-pay | Admitting: Internal Medicine

## 2019-06-17 ENCOUNTER — Ambulatory Visit: Payer: BC Managed Care – PPO

## 2019-06-17 DIAGNOSIS — U071 COVID-19: Secondary | ICD-10-CM | POA: Diagnosis not present

## 2019-06-17 NOTE — Assessment & Plan Note (Signed)
Finish doxycycline and needs more time to adequately recover. Will have in person visit at the office last week of February for follow up exam and CXR. Call sooner if unable to return to work or if worsening or lack of improvement. We talked about how symptoms could linger for weeks to months in some people.

## 2019-06-17 NOTE — Progress Notes (Signed)
Virtual Visit via Video Note  I connected with Shannon Navarro on 06/17/19 at 10:40 AM EST by a video enabled telemedicine application and verified that I am speaking with the correct person using two identifiers.  The patient and the provider were at separate locations throughout the entire encounter.   I discussed the limitations of evaluation and management by telemedicine and the availability of in person appointments. The patient expressed understanding and agreed to proceed. The patient and the provider were the only parties present for the visit unless noted in HPI below.  History of Present Illness: The patient is a 64 y.o. female with visit for covid-19 positive. Started 05/30/19 with positive test. Has had variable course and ended up doing course of prednisone. Just started doxycycline about 4-5 days ago to treat concurrent sinus infection. Denies worsening of symptoms. She has moved albuterol to BID scheduled from TID and that went well. Denies SOB. Some congestion and hoarseness still. Energy levels are still low. Overall it is frustrating that she is not improving as expected. Has tried doxycycline and prednisone and albuterol which have helped.   FMLA start date 06/01/19, out until 06/27/19  Observations/Objective: Appearance: improving from prior, appears to have more energy and color, breathing appears normal, some coughing no production during visit, casual grooming, abdomen does not appear distended, throat not visualized voice is still hoarse although slightly improved from prior, memory normal, mental status is A and O times 3  Assessment and Plan: See problem oriented charting  Follow Up Instructions: finish doxycycline, will fill out FMLA with recommended return to work date 06/27/19  I discussed the assessment and treatment plan with the patient. The patient was provided an opportunity to ask questions and all were answered. The patient agreed with the plan and demonstrated an  understanding of the instructions.   The patient was advised to call back or seek an in-person evaluation if the symptoms worsen or if the condition fails to improve as anticipated.  [name], MD

## 2019-06-20 ENCOUNTER — Telehealth: Payer: Self-pay | Admitting: Internal Medicine

## 2019-06-20 NOTE — Telephone Encounter (Signed)
I received FMLA via fax. Forms have been completed for dates 1/20 to 2/15. Placed in providers box to review and sign.

## 2019-06-22 NOTE — Telephone Encounter (Signed)
Ok for return to work letter?

## 2019-06-22 NOTE — Telephone Encounter (Signed)
Forms have been signed, Faxed, Copy sent to scan &Charged for.   Patient informed, Original mailed to patient for her records.

## 2019-06-22 NOTE — Telephone Encounter (Addendum)
Patient needs a note saying she can return to work with no restrictions.  Once completed I can fax the letter.

## 2019-06-23 NOTE — Telephone Encounter (Signed)
Letter has been faxed.   VM is full unable to leave message.

## 2019-06-23 NOTE — Telephone Encounter (Signed)
Fine

## 2019-07-08 ENCOUNTER — Ambulatory Visit (INDEPENDENT_AMBULATORY_CARE_PROVIDER_SITE_OTHER): Payer: BC Managed Care – PPO | Admitting: Internal Medicine

## 2019-07-08 ENCOUNTER — Other Ambulatory Visit: Payer: Self-pay

## 2019-07-08 ENCOUNTER — Encounter: Payer: Self-pay | Admitting: Internal Medicine

## 2019-07-08 ENCOUNTER — Ambulatory Visit (INDEPENDENT_AMBULATORY_CARE_PROVIDER_SITE_OTHER): Payer: BC Managed Care – PPO

## 2019-07-08 VITALS — BP 142/90 | HR 87 | Temp 98.1°F | Ht 66.0 in | Wt 280.0 lb

## 2019-07-08 DIAGNOSIS — E559 Vitamin D deficiency, unspecified: Secondary | ICD-10-CM

## 2019-07-08 DIAGNOSIS — U071 COVID-19: Secondary | ICD-10-CM

## 2019-07-08 LAB — VITAMIN D 25 HYDROXY (VIT D DEFICIENCY, FRACTURES): VITD: 42.77 ng/mL (ref 30.00–100.00)

## 2019-07-08 MED ORDER — DOXYCYCLINE HYCLATE 100 MG PO TABS: 100.0000 mg | ORAL_TABLET | Freq: Two times a day (BID) | ORAL | 0 refills | Status: DC

## 2019-07-08 NOTE — Patient Instructions (Addendum)
We have sent in the doxycycline for 2 weeks.   We will get the labs today and the chest x-ray.

## 2019-07-08 NOTE — Progress Notes (Signed)
   Subjective:   Patient ID: Shannon Navarro, female    DOB: 08-10-1955, 64 y.o.   MRN: [id number]  HPI The patient is a 64 YO female coming in for follow up. She did have covid-19 back toward the end of January. She did return to work about 1-2 weeks ago and is doing fine. Still having sinus congestion and fatigue and denies SOB or low oxygen levels.   Review of Systems  Constitutional: Positive for fatigue.  HENT: Positive for congestion, sinus pressure and sinus pain.   Eyes: Negative.   Respiratory: Negative for cough, chest tightness and shortness of breath.   Cardiovascular: Negative for chest pain, palpitations and leg swelling.  Gastrointestinal: Negative for abdominal distention, abdominal pain, constipation, diarrhea, nausea and vomiting.  Musculoskeletal: Negative.   Skin: Negative.   Neurological: Negative.   Psychiatric/Behavioral: Negative.     Objective:  Physical Exam Constitutional:      Appearance: She is well-developed.  HENT:     Head: Normocephalic and atraumatic.     Comments: Redness tonsils left more than right, left tonsil swollen clear drainage present    Right Ear: Tympanic membrane normal.     Left Ear: Tympanic membrane normal.  Cardiovascular:     Rate and Rhythm: Normal rate and regular rhythm.  Pulmonary:     Effort: Pulmonary effort is normal. No respiratory distress.     Breath sounds: Normal breath sounds. No wheezing or rales.  Abdominal:     General: Bowel sounds are normal. There is no distension.     Palpations: Abdomen is soft.     Tenderness: There is no abdominal tenderness. There is no rebound.  Musculoskeletal:     Cervical back: Normal range of motion.  Skin:    General: Skin is warm and dry.  Neurological:     Mental Status: She is alert and oriented to person, place, and time.     Coordination: Coordination normal.     Vitals:   07/08/19 1550  BP: (!) 142/90  Pulse: 87  Temp: 98.1 F (36.7 C)  TempSrc: Oral  SpO2:  98%  Weight: 280 lb (127 kg)  Height: 5\' 6"  (1.676 m)   This visit occurred during the SARS-CoV-2 public health emergency.  Safety protocols were in place, including screening questions prior to the visit, additional usage of staff PPE, and extensive cleaning of exam room while observing appropriate contact time as indicated for disinfecting solutions.   Assessment & Plan:

## 2019-07-08 NOTE — Assessment & Plan Note (Signed)
Doing much better. Will repeat 2 week course doxycycline as she still has sinus infection lingering. Checking CXR for resolution of pneumonia.

## 2019-07-15 ENCOUNTER — Ambulatory Visit (INDEPENDENT_AMBULATORY_CARE_PROVIDER_SITE_OTHER): Payer: BC Managed Care – PPO | Admitting: Internal Medicine

## 2019-07-15 ENCOUNTER — Encounter: Payer: Self-pay | Admitting: Internal Medicine

## 2019-07-15 DIAGNOSIS — U071 COVID-19: Secondary | ICD-10-CM | POA: Diagnosis not present

## 2019-07-15 DIAGNOSIS — J0111 Acute recurrent frontal sinusitis: Secondary | ICD-10-CM | POA: Diagnosis not present

## 2019-07-15 DIAGNOSIS — R001 Bradycardia, unspecified: Secondary | ICD-10-CM

## 2019-07-15 NOTE — Assessment & Plan Note (Signed)
Improving with doxycycline and she will finish course.

## 2019-07-15 NOTE — Assessment & Plan Note (Signed)
Much improved now that she is well. Can resume amlodipine.

## 2019-07-15 NOTE — Progress Notes (Signed)
  Virtual Visit via Video Note  I connected with Shannon Navarro on 07/15/19 at  3:00 PM EST by a video enabled telemedicine application and verified that I am speaking with the correct person using two identifiers.  The patient and the provider were at separate locations throughout the entire encounter.   I discussed the limitations of evaluation and management by telemedicine and the availability of in person appointments. The patient expressed understanding and agreed to proceed. The patient and the provider were the only parties present for the visit unless noted in HPI below.  History of Present Illness: The patient is a 64 y.o. female with visit for questions about results recently and covid-19 vaccine. Started feeling better overall finally with sinuses. Not fully resolved but improved.   Observations/Objective: Appearance: normal, breathing appears normal, casual grooming, abdomen does not appear distended, throat normal, memory A and O times 3, mental status is normal  Assessment and Plan: See problem oriented charting  Follow Up Instructions: answered questions about covid-19 and vaccine and CXR findings recently.  Visit time 20 minutes in face to face communication with patient and coordination of care, additional 10 minutes spent in record review, coordination or care, ordering tests, communicating/referring to other healthcare professionals, documenting in medical records all on the same day of the visit for total time 30 minutes spent on the visit.    I discussed the assessment and treatment plan with the patient. The patient was provided an opportunity to ask questions and all were answered. The patient agreed with the plan and demonstrated an understanding of the instructions.   The patient was advised to call back or seek an in-person evaluation if the symptoms worsen or if the condition fails to improve as anticipated.  [name], MD

## 2019-07-15 NOTE — Assessment & Plan Note (Signed)
Discussed no cardiolomegaly on recent CXR and likely was spurious finding on portable. Talked about vaccination and timeline for that.

## 2019-07-22 ENCOUNTER — Other Ambulatory Visit: Payer: Self-pay

## 2019-07-22 ENCOUNTER — Ambulatory Visit
Admission: RE | Admit: 2019-07-22 | Discharge: 2019-07-22 | Disposition: A | Discharge status: Home / Self Care | Payer: BC Managed Care – PPO | Source: Ambulatory Visit | Attending: Obstetrics and Gynecology | Admitting: Obstetrics and Gynecology

## 2019-07-22 DIAGNOSIS — Z1231 Encounter for screening mammogram for malignant neoplasm of breast: Secondary | ICD-10-CM

## 2019-07-25 ENCOUNTER — Other Ambulatory Visit: Payer: Self-pay | Admitting: Obstetrics and Gynecology

## 2019-07-25 DIAGNOSIS — R928 Other abnormal and inconclusive findings on diagnostic imaging of breast: Secondary | ICD-10-CM

## 2019-08-04 ENCOUNTER — Ambulatory Visit
Admission: RE | Admit: 2019-08-04 | Discharge: 2019-08-04 | Disposition: A | Discharge status: Home / Self Care | Payer: BC Managed Care – PPO | Source: Ambulatory Visit | Attending: Obstetrics and Gynecology | Admitting: Obstetrics and Gynecology

## 2019-08-04 ENCOUNTER — Other Ambulatory Visit: Payer: Self-pay | Admitting: Obstetrics and Gynecology

## 2019-08-04 ENCOUNTER — Other Ambulatory Visit: Payer: Self-pay

## 2019-08-04 DIAGNOSIS — R928 Other abnormal and inconclusive findings on diagnostic imaging of breast: Secondary | ICD-10-CM

## 2019-08-04 DIAGNOSIS — R921 Mammographic calcification found on diagnostic imaging of breast: Secondary | ICD-10-CM

## 2019-08-12 ENCOUNTER — Ambulatory Visit
Admission: RE | Admit: 2019-08-12 | Discharge: 2019-08-12 | Disposition: A | Discharge status: Home / Self Care | Payer: BC Managed Care – PPO | Source: Ambulatory Visit | Attending: Obstetrics and Gynecology | Admitting: Obstetrics and Gynecology

## 2019-08-12 ENCOUNTER — Other Ambulatory Visit: Payer: Self-pay

## 2019-08-12 DIAGNOSIS — R921 Mammographic calcification found on diagnostic imaging of breast: Secondary | ICD-10-CM

## 2019-08-12 HISTORY — PX: BREAST BIOPSY: SHX20

## 2019-08-22 DIAGNOSIS — I340 Nonrheumatic mitral (valve) insufficiency: Secondary | ICD-10-CM | POA: Insufficient documentation

## 2019-08-22 NOTE — Progress Notes (Signed)
Cardiology Office Note   Date:  08/26/2019   ID:  Shannon Navarro, DOB May 29, 1955, MRN [id number]  PCP:  [name], MD  Cardiologist:   Minus Breeding, MD   Chief Complaint  Patient presents with  . Abnormal CXR      History of Present Illness: Shannon Navarro is a 64 y.o. female who presents for evaluation of cardiomegaly on the chest x-ray.  She was also found on a chest x-ray to have aortic atherosclerosis.  She has had a history of bradycardia.   She had an outpatient exercise tolerance test was ordered and completed on 06/13/2015, there was no evidence of ischemia, she was able to achieve 85% maximal heart rate after 6 minutes, no ST segment deviation was noted. The patient presented to the emergency room on 02/21/2016 with palpitation that woke her up from sleep and found to have new onset of atrial fibrillation. Patient spontaneously converted on diltiazem bolus. Given her elevated CHA2DS2-Vasc score 2 (female and HTN), she was placed on Xarelto.     In January she had Covid.  She had bilateral pneumonia and was quite sick but not hospitalized.  She had a couple of trips to the emergency room.  I reviewed emergency room records from 19 January and the 30th.  I reviewed multiple chest x-rays including one in February.  She was found to have some aortic atherosclerosis on one of the chest x-rays and a suggestion of cardiomegaly on the most recent one.    She is slowly recovering from her Covid.  She is not had any new chest pressure, neck or arm discomfort.  She has no new shortness of breath, PND or orthopnea.  She had no palpitations, presyncope or syncope.   Past Medical History:  Diagnosis Date  . Allergic rhinitis   . Anticoagulant long-term use    XARELTO  . ANXIETY   . Endometrial polyp   . Endometrial polyp   . Fatty liver 06/23/07  . GERD   . Hiatal hernia   . History of esophageal stricture    mainly due to gerd  . History of exercise stress test  06/13/2015   PER DR Brighton Delio NOTE -- NO EVIDENCE ISCHEMIA  . HYPERTENSION   . Impaired fasting glucose   . Migraine    hx of migraines none recent  . OSA on CPAP followed by dr Halford Chessman   per study 02-09-2012  severe osa w/ AHI 33.8  . Paroxysmal atrial fibrillation [facility]) cardioloigst-  dr Nyaire Denbleyker   dx 02-21-2016 at ED  . Pseudotumor cerebri 2007  . Vitamin D deficiency 07/2013    Past Surgical History:  Procedure Laterality Date  . COLONOSCOPY    . colonscopy  2007   normal  . DILATATION & CURETTAGE/HYSTEROSCOPY WITH MYOSURE N/A 03/26/2017   Procedure: DILATATION & CURETTAGE/HYSTEROSCOPY WITH MYOSURE;  Surgeon: Paula Compton, MD;  Location: [place];  Service: Gynecology;  Laterality: N/A;  . DILATION AND CURETTAGE OF UTERUS  2010  . LAPAROSCOPIC HYSTERECTOMY Bilateral 06/18/2017   Procedure: HYSTERECTOMY TOTAL LAPAROSCOPIC;  Surgeon: Paula Compton, MD;  Location: [facility];  Service: Gynecology;  Laterality: Bilateral;  OUT PT IN BED, needs extra time MD request 2.5hrs in OR  . right knee arthroscopy  03/2013  . TRANSTHORACIC ECHOCARDIOGRAM  03/11/2016   ef 60-65%/  mild MR/ trivial PR and TR  . TUBAL LIGATION    . UPPER GASTROINTESTINAL ENDOSCOPY  Current Outpatient Medications  Medication Sig Dispense Refill  . amLODipine (NORVASC) 5 MG tablet Take 1 tablet by mouth once daily 90 tablet 0  . clotrimazole-betamethasone (LOTRISONE) cream Apply 1 application topically 2 (two) times daily. 100 g 0  . doxycycline (VIBRA-TABS) 100 MG tablet Take 1 tablet (100 mg total) by mouth 2 (two) times daily. 28 tablet 0  . losartan (COZAAR) 100 MG tablet Take 1 tablet by mouth once daily 90 tablet 3  . promethazine-dextromethorphan (PROMETHAZINE-DM) 6.25-15 MG/5ML syrup Take 5 mLs by mouth 4 (four) times daily as needed for cough. 120 mL 0  . rivaroxaban (XARELTO) 20 MG TABS tablet TAKE 1 TABLET BY MOUTH ONCE DAILY WITH SUPPER 90 tablet 2  .  venlafaxine XR (EFFEXOR-XR) 75 MG 24 hr capsule Take 1 capsule by mouth once daily 90 capsule 3   No current facility-administered medications for this visit.    Allergies:   Cefaclor, Codeine, Hydrocodone, Levaquin [levofloxacin], Nexium [esomeprazole magnesium], Penicillins, and Sulfa antibiotics    ROS:  Please see the history of present illness.   Otherwise, review of systems are positive for none.   All other systems are reviewed and negative.    PHYSICAL EXAM: VS:  BP 126/90   Pulse (!) 56   Temp (!) 97.5 F (36.4 C)   Ht 5\' 6"  (1.676 m)   Wt 279 lb 6.4 oz (126.7 kg)   SpO2 98%   BMI 45.10 kg/m  , BMI Body mass index is 45.1 kg/m. GENERAL:  Well appearing NECK:  No jugular venous distention, waveform within normal limits, carotid upstroke brisk and symmetric, no bruits, no thyromegaly LUNGS:  Clear to auscultation bilaterally BACK:  No CVA tenderness CHEST:  Unremarkable HEART:  PMI not displaced or sustained,S1 and S2 within normal limits, no S3, no S4, no clicks, no rubs, no murmurs ABD:  Flat, positive bowel sounds normal in frequency in pitch, no bruits, no rebound, no guarding, no midline pulsatile mass, no hepatomegaly, no splenomegaly EXT:  2 plus pulses throughout, no edema, no cyanosis no clubbing    EKG:  EKG is ordered today. The ekg ordered today demonstrates normal sinus rhythm, rate 56, axis within normal limits, intervals within normal limits, no acute ST-T wave changes.   Recent Labs: 01/06/2019: TSH 1.55 06/12/2019: ALT 25; BUN 13; Creatinine, Ser 0.69; Hemoglobin 12.7; Platelets 314; Potassium 3.4; Sodium 142    Lipid Panel    Component Value Date/Time   CHOL 160 01/06/2019 1634   TRIG 132.0 01/06/2019 1634   HDL 45.80 01/06/2019 1634   CHOLHDL 3 01/06/2019 1634   VLDL 26.4 01/06/2019 1634   LDLCALC 88 01/06/2019 1634      Wt Readings from Last 3 Encounters:  08/24/19 279 lb 6.4 oz (126.7 kg)  07/08/19 280 lb (127 kg)  06/11/19 274 lb  (124.3 kg)      Other studies Reviewed: Additional studies/ records that were reviewed today include: ED records. Review of the above records demonstrates:  Please see elsewhere in the note.     ASSESSMENT AND PLAN:  PAF:Ms.Georges Lynch Saundershas a CHA2DS2 - VASc score of 2.    [id number] was mild.    She also had some focal basal hypertrophy and mild concentric hypertrophy in 2017.  I will repeat an echocardiogram.    HTN: The blood pressure is  at target.  No change in therapy.    BRADYCARDIA:  She has had no new symptoms.  No change in therapy.  OBSTRUCTIVE SLEEP  APNEA: She uses CPAP.   AORTIC ATHEROSCLEROSIS: She had a negative stress test in 2017.  She has had no active evidence of ischemia please multiple negative cardiac enzymes when she was very sick with Covid.  I do not suspect obstructive coronary disease.  No change in therapy.  She needs continued risk reduction.    OBESITY: She understands need to lose weight with diet and exercise.    We have talked about this extensively previously.  COVID: She has had Covid and she has been vaccinated.    Current medicines are reviewed at length with the patient today.  The patient does not have concerns regarding medicines.  The following changes have been made:  no change  Labs/ tests ordered today include:   Orders Placed This Encounter  Procedures  . EKG 12-Lead  . ECHOCARDIOGRAM COMPLETE     Disposition:   FU with me in 12 months.     Signed, Minus Breeding, MD  08/26/2019 1:11 PM    [place]

## 2019-08-24 ENCOUNTER — Encounter: Payer: Self-pay | Admitting: Cardiology

## 2019-08-24 ENCOUNTER — Other Ambulatory Visit: Payer: Self-pay

## 2019-08-24 ENCOUNTER — Ambulatory Visit (INDEPENDENT_AMBULATORY_CARE_PROVIDER_SITE_OTHER): Payer: BC Managed Care – PPO | Admitting: Cardiology

## 2019-08-24 VITALS — BP 126/90 | HR 56 | Temp 97.5°F | Ht 66.0 in | Wt 279.4 lb

## 2019-08-24 DIAGNOSIS — I10 Essential (primary) hypertension: Secondary | ICD-10-CM

## 2019-08-24 DIAGNOSIS — I340 Nonrheumatic mitral (valve) insufficiency: Secondary | ICD-10-CM

## 2019-08-24 DIAGNOSIS — I480 Paroxysmal atrial fibrillation: Secondary | ICD-10-CM

## 2019-08-24 DIAGNOSIS — R001 Bradycardia, unspecified: Secondary | ICD-10-CM | POA: Diagnosis not present

## 2019-08-24 DIAGNOSIS — G4733 Obstructive sleep apnea (adult) (pediatric): Secondary | ICD-10-CM

## 2019-08-24 DIAGNOSIS — I517 Cardiomegaly: Secondary | ICD-10-CM

## 2019-08-24 DIAGNOSIS — Z7189 Other specified counseling: Secondary | ICD-10-CM

## 2019-08-24 NOTE — Patient Instructions (Signed)
Medication Instructions:  NO CHANGES *If you need a refill on your cardiac medications before your next appointment, please call your pharmacy*  Lab Work: NONE ORDERED THIS VISIT  Testing/Procedures: Your physician has requested that you have an echocardiogram. Echocardiography is a painless test that uses sound waves to create images of your heart. It provides your doctor with information about the size and shape of your heart and how well your heart's chambers and valves are working. This procedure takes approximately one hour. There are no restrictions for this procedure. [place]  Follow-Up: At [facility], you and your health needs are our priority.  As part of our continuing mission to provide you with exceptional heart care, we have created designated Provider Care Teams.  These Care Teams include your primary Cardiologist (physician) and Advanced Practice Providers (APPs -  Physician Assistants and Nurse Practitioners) who all work together to provide you with the care you need, when you need it.  Your next appointment:   12 month(s)  You will receive a reminder letter in the mail two months in advance. If you don't receive a letter, please call our office to schedule the follow-up appointment.  The format for your next appointment:   In Person  Provider:   Minus Breeding, MD

## 2019-08-26 ENCOUNTER — Encounter: Payer: Self-pay | Admitting: Cardiology

## 2019-09-02 ENCOUNTER — Encounter: Payer: Self-pay | Admitting: Internal Medicine

## 2019-09-02 ENCOUNTER — Ambulatory Visit (INDEPENDENT_AMBULATORY_CARE_PROVIDER_SITE_OTHER): Payer: BC Managed Care – PPO

## 2019-09-02 ENCOUNTER — Other Ambulatory Visit: Payer: Self-pay

## 2019-09-02 ENCOUNTER — Ambulatory Visit (INDEPENDENT_AMBULATORY_CARE_PROVIDER_SITE_OTHER): Payer: BC Managed Care – PPO | Admitting: Internal Medicine

## 2019-09-02 VITALS — BP 140/82 | HR 82 | Temp 98.3°F | Ht 66.0 in | Wt 279.4 lb

## 2019-09-02 DIAGNOSIS — J1282 Pneumonia due to coronavirus disease 2019: Secondary | ICD-10-CM | POA: Diagnosis not present

## 2019-09-02 DIAGNOSIS — U071 COVID-19: Secondary | ICD-10-CM

## 2019-09-02 MED ORDER — CLOTRIMAZOLE-BETAMETHASONE 1-0.05 % EX CREA: 1.0000 "application " | TOPICAL_CREAM | Freq: Two times a day (BID) | CUTANEOUS | 3 refills | Status: DC

## 2019-09-02 NOTE — Progress Notes (Signed)
   Subjective:   Patient ID: Shannon Navarro, female    DOB: August 05, 1955, 64 y.o.   MRN: [id number]  HPI The patient is a 64 YO female coming in for follow up of pneumonia she had with covid-19. Repeat CXR with some persistent disease and needs follow up. She is doing much better and coughing is finally gone. She does have chronic allergy and sinus issues and those are present currently but not as bad as usual when she has an infection. Denies fevers or chills. Getting echo from cardiology for follow up.  Review of Systems  Constitutional: Negative.   HENT: Positive for congestion and postnasal drip.   Eyes: Negative.   Respiratory: Negative for cough, chest tightness and shortness of breath.   Cardiovascular: Negative for chest pain, palpitations and leg swelling.  Gastrointestinal: Negative for abdominal distention, abdominal pain, constipation, diarrhea, nausea and vomiting.  Musculoskeletal: Negative.   Skin: Negative.   Neurological: Negative.   Psychiatric/Behavioral: Negative.     Objective:  Physical Exam Constitutional:      Appearance: She is well-developed. She is obese.  HENT:     Head: Normocephalic and atraumatic.     Ears:     Comments: Bulging TMs bilaterally with clear fluid, oropharynx with redness and clear drainage, nose without crusting. Cardiovascular:     Rate and Rhythm: Normal rate and regular rhythm.  Pulmonary:     Effort: Pulmonary effort is normal. No respiratory distress.     Breath sounds: Normal breath sounds. No wheezing or rales.  Abdominal:     General: Bowel sounds are normal. There is no distension.     Palpations: Abdomen is soft.     Tenderness: There is no abdominal tenderness. There is no rebound.  Musculoskeletal:     Cervical back: Normal range of motion.  Skin:    General: Skin is warm and dry.  Neurological:     Mental Status: She is alert and oriented to person, place, and time.     Coordination: Coordination normal.      Vitals:   09/02/19 1611  BP: 140/82  Pulse: 82  Temp: 98.3 F (36.8 C)  SpO2: 99%  Weight: 279 lb 6.4 oz (126.7 kg)  Height: 5\' 6"  (1.676 m)    This visit occurred during the SARS-CoV-2 public health emergency.  Safety protocols were in place, including screening questions prior to the visit, additional usage of staff PPE, and extensive cleaning of exam room while observing appropriate contact time as indicated for disinfecting solutions.   Assessment & Plan:

## 2019-09-02 NOTE — Assessment & Plan Note (Signed)
Needs follow up CXR today. Clinically is almost resolved with just residual hoarseness and mild change to taste.

## 2019-09-03 ENCOUNTER — Other Ambulatory Visit: Payer: Self-pay | Admitting: Cardiology

## 2019-09-08 ENCOUNTER — Other Ambulatory Visit: Payer: Self-pay

## 2019-09-08 ENCOUNTER — Telehealth: Payer: Self-pay | Admitting: Cardiology

## 2019-09-08 ENCOUNTER — Ambulatory Visit (HOSPITAL_COMMUNITY): Payer: BC Managed Care – PPO | Attending: Internal Medicine

## 2019-09-08 DIAGNOSIS — I517 Cardiomegaly: Secondary | ICD-10-CM | POA: Diagnosis present

## 2019-09-08 NOTE — Telephone Encounter (Signed)
Pt c/o medication issue:  1. Name of Medication: Propanolol 20mg   2. How are you currently taking this medication (dosage and times per day)? n/a  3. Are you having a reaction (difficulty breathing--STAT)? no  4. What is your medication issue? Patient wants to know why this medication was discontinued. Patient states that she took this medication because she is on a blood thinner.

## 2019-09-08 NOTE — Telephone Encounter (Signed)
Called patient- she states that she has always had Propanolol to use if she needed it, but it wasn't refilled for her- she would like to know if she doesn't need this, does she need to continue the blood thinner. She meant to ask at her appointment.

## 2019-09-12 MED ORDER — PROPRANOLOL HCL 20 MG PO TABS: 20.0000 mg | ORAL_TABLET | Freq: Three times a day (TID) | ORAL | 4 refills | Status: DC | PRN

## 2019-09-12 NOTE — Telephone Encounter (Signed)
OK to take propranolol 20 mg po tid as needed.  Disp number 60 with 4 refills

## 2019-09-12 NOTE — Telephone Encounter (Signed)
Spoke with patient. Reviewed Dr. Rosezella [place] message and sent in prescription for propranolol 20mg  TID PRN to preferred pharmacy. Patient verbalized understanding. No further questions at this time.

## 2019-09-16 ENCOUNTER — Telehealth: Payer: Self-pay | Admitting: Cardiology

## 2019-09-16 NOTE — Telephone Encounter (Signed)
Patient calling for echo results 

## 2019-09-16 NOTE — Telephone Encounter (Signed)
Reviewed echo results with pt. Thankful for the call with no other questions at this time.

## 2019-10-03 ENCOUNTER — Encounter: Payer: Self-pay | Admitting: Internal Medicine

## 2019-10-03 ENCOUNTER — Ambulatory Visit (INDEPENDENT_AMBULATORY_CARE_PROVIDER_SITE_OTHER): Payer: BC Managed Care – PPO | Admitting: Internal Medicine

## 2019-10-03 ENCOUNTER — Other Ambulatory Visit: Payer: Self-pay

## 2019-10-03 DIAGNOSIS — D179 Benign lipomatous neoplasm, unspecified: Secondary | ICD-10-CM | POA: Insufficient documentation

## 2019-10-03 DIAGNOSIS — D171 Benign lipomatous neoplasm of skin and subcutaneous tissue of trunk: Secondary | ICD-10-CM | POA: Diagnosis not present

## 2019-10-03 NOTE — Progress Notes (Signed)
   Subjective:   Patient ID: Shannon Navarro, female    DOB: 03-08-1956, 64 y.o.   MRN: [id number]  HPI The patient is a 64 YO female coming in for concerns about spot on the left shoulder blade which is growing some. She is also noticing a hard spot close to this which she cannot recall if it was there before. It does hurt with sitting on a chair or lying on that side which resolves when she moves position. Is not taking anything for this. Overall this started years ago and growing some.  Review of Systems  Constitutional: Negative.   HENT: Negative.   Eyes: Negative.   Respiratory: Negative for cough, chest tightness and shortness of breath.   Cardiovascular: Negative for chest pain, palpitations and leg swelling.  Gastrointestinal: Negative for abdominal distention, abdominal pain, constipation, diarrhea, nausea and vomiting.  Musculoskeletal: Positive for myalgias.  Skin: Negative.   Neurological: Negative.   Psychiatric/Behavioral: Negative.     Objective:  Physical Exam Constitutional:      Appearance: She is well-developed. She is obese.  HENT:     Head: Normocephalic and atraumatic.  Cardiovascular:     Rate and Rhythm: Normal rate and regular rhythm.  Pulmonary:     Effort: Pulmonary effort is normal. No respiratory distress.     Breath sounds: Normal breath sounds. No wheezing or rales.  Abdominal:     General: Bowel sounds are normal. There is no distension.     Palpations: Abdomen is soft.     Tenderness: There is no abdominal tenderness. There is no rebound.  Musculoskeletal:     Cervical back: Normal range of motion.     Comments: Lipoma on the left shoulder near scapula, firm area is part of the scapula bone.  Skin:    General: Skin is warm and dry.  Neurological:     Mental Status: She is alert and oriented to person, place, and time.     Coordination: Coordination normal.     Vitals:   10/03/19 0849  BP: 126/84  Pulse: (!) 50  Temp: 98.2 F (36.8  C)  SpO2: 100%  Weight: 275 lb 9.6 oz (125 kg)  Height: 5\' 6"  (1.676 m)    This visit occurred during the SARS-CoV-2 public health emergency.  Safety protocols were in place, including screening questions prior to the visit, additional usage of staff PPE, and extensive cleaning of exam room while observing appropriate contact time as indicated for disinfecting solutions.   Assessment & Plan:

## 2019-10-03 NOTE — Assessment & Plan Note (Signed)
Advised that we can do referral for general surgery. Reassurance given that this is not cancerous. Is having pain associated with this.

## 2019-10-03 NOTE — Patient Instructions (Signed)
We can have you talk to a surgeon if you want for the spot on the back.

## 2019-11-10 ENCOUNTER — Encounter: Payer: Self-pay | Admitting: Internal Medicine

## 2019-11-10 ENCOUNTER — Telehealth (INDEPENDENT_AMBULATORY_CARE_PROVIDER_SITE_OTHER): Payer: BC Managed Care – PPO | Admitting: Internal Medicine

## 2019-11-10 DIAGNOSIS — J0111 Acute recurrent frontal sinusitis: Secondary | ICD-10-CM

## 2019-11-10 MED ORDER — FLUTICASONE PROPIONATE 50 MCG/ACT NA SUSP: 2.0000 | Freq: Every day | NASAL | 6 refills | Status: DC

## 2019-11-10 MED ORDER — DOXYCYCLINE HYCLATE 100 MG PO TABS: 100.0000 mg | ORAL_TABLET | Freq: Two times a day (BID) | ORAL | 0 refills | Status: AC

## 2019-11-10 NOTE — Progress Notes (Signed)
Virtual Visit via Video Note  I connected with Shannon Navarro on 11/10/19 at 10:00 AM EDT by a video enabled telemedicine application and verified that I am speaking with the correct person using two identifiers.  The patient and the provider were at separate locations throughout the entire encounter. Patient location: home, Provider location: work   I discussed the limitations of evaluation and management by telemedicine and the availability of in person appointments. The patient expressed understanding and agreed to proceed. The patient and the provider were the only parties present for the visit unless noted in HPI below.  History of Present Illness: The patient is a 64 y.o. female with visit for sinus infection. Started about 1-2 weeks ago. Took covid-19 test which was negative. Has congestion and sore throat. She is using chloroseptic which has helped with pain in throat slightly but nothing else. Taking otc allergy medication and is out of nose spray which sometimes helps. She is getting some congestion in her chest now. No SOB but cough. Denies fevers or chills. Overall it is worsening.   Observations/Objective: Appearance: normal, sounds congested, face puffy and pain to self palpation sinuses, breathing appears normal, casual grooming, abdomen does not appear distended, throat redness and drainage, memory normal, mental status is A and O times 3  Assessment and Plan: See problem oriented charting  Follow Up Instructions: rx doxycycline and flonase  I discussed the assessment and treatment plan with the patient. The patient was provided an opportunity to ask questions and all were answered. The patient agreed with the plan and demonstrated an understanding of the instructions.   The patient was advised to call back or seek an in-person evaluation if the symptoms worsen or if the condition fails to improve as anticipated.  [name], MD

## 2019-11-10 NOTE — Assessment & Plan Note (Signed)
Rx doxycycline and flonase to help with current sinus infection. She is prone to these and reminded to stay with nose spray and otc allergy medication year round to help.

## 2019-11-25 ENCOUNTER — Other Ambulatory Visit: Payer: Self-pay | Admitting: Internal Medicine

## 2019-11-28 NOTE — Telephone Encounter (Signed)
1.Medication Requested:losartan (COZAAR) 100 MG tablet  2. Pharmacy (Name, Street, [place]):[facility] [place]  3. On Med List: Yes   4. Last Visit with PCP: 7.1.21   5. Next visit date with PCP: 8.30.21    Agent: Please be advised that RX refills may take up to 3 business days. We ask that you follow-up with your pharmacy.

## 2019-12-25 ENCOUNTER — Other Ambulatory Visit: Payer: Self-pay | Admitting: Cardiology

## 2020-01-09 ENCOUNTER — Encounter: Payer: BC Managed Care – PPO | Admitting: Internal Medicine

## 2020-03-03 ENCOUNTER — Telehealth (INDEPENDENT_AMBULATORY_CARE_PROVIDER_SITE_OTHER): Payer: BC Managed Care – PPO | Admitting: Family Medicine

## 2020-03-03 DIAGNOSIS — J0111 Acute recurrent frontal sinusitis: Secondary | ICD-10-CM | POA: Diagnosis not present

## 2020-03-03 MED ORDER — DOXYCYCLINE HYCLATE 100 MG PO TABS: 100.0000 mg | ORAL_TABLET | Freq: Two times a day (BID) | ORAL | 0 refills | Status: AC

## 2020-03-03 NOTE — Progress Notes (Signed)
Virtual Visit via Video Note  I connected with Shannon Navarro  on 03/03/20 at  9:20 AM EDT by a video enabled telemedicine application and verified that I am speaking with the correct person using two identifiers.  Location patient: home Location provider: [place], [place] [address] Persons participating in the virtual visit: patient, provider  I discussed the limitations of evaluation and management by telemedicine and the availability of in person appointments. The patient expressed understanding and agreed to proceed.   Shannon Navarro DOB: 1955-09-18 Encounter date: 03/03/2020  This is a 64 y.o. female who presents with Chief Complaint  Patient presents with  . Sinusitis    History of present illness: Gets sinus infections yearly. This one has been going on for about 6-7 weeks. Pressure frontal, behind eyes, hoarse, with postnasal drainage. Little cough, mild. Getting worse and not better. Drainage is thicker and more purulent-green in color.  No fevers, but usually doesn't.   Using nasal spray (flonase) and claritin. Not using sinus rinses or other medications. Avoids sinus meds due to hx htn.   Allergies  Allergen Reactions  . Cefaclor Rash  . Codeine Nausea And Vomiting  . Hydrocodone Nausea And Vomiting    Severe vomiting/  COUGH SYRUP CAUSE SEVERE VOMITING  . Levaquin [Levofloxacin] Nausea Only  . Nexium [Esomeprazole Magnesium] Other (See Comments)    Made legs ache  . Penicillins Rash  . Sulfa Antibiotics Rash   Current Meds  Medication Sig  . amLODipine (NORVASC) 5 MG tablet Take 1 tablet by mouth once daily  . clotrimazole-betamethasone (LOTRISONE) cream Apply 1 application topically 2 (two) times daily.  . fluticasone (FLONASE) 50 MCG/ACT nasal spray Place 2 sprays into both nostrils daily.  Marland Kitchen losartan (COZAAR) 100 MG tablet Take 1 tablet by mouth once daily  . propranolol (INDERAL) 20 MG tablet Take 1 tablet  (20 mg total) by mouth 3 (three) times daily as needed.  . rivaroxaban (XARELTO) 20 MG TABS tablet TAKE 1 TABLET BY MOUTH ONCE DAILY WITH SUPPER  . venlafaxine XR (EFFEXOR-XR) 75 MG 24 hr capsule Take 1 capsule by mouth once daily    Review of Systems  Constitutional: Negative for chills and fever.  HENT: Positive for congestion, postnasal drip, sinus pressure, sinus pain and sore throat. Negative for ear pain.   Respiratory: Negative for cough (minimal), shortness of breath and wheezing.   Cardiovascular: Negative for chest pain.    Objective:  There were no vitals taken for this visit.      BP Readings from Last 3 Encounters:  10/03/19 126/84  09/02/19 140/82  08/24/19 126/90   Wt Readings from Last 3 Encounters:  10/03/19 275 lb 9.6 oz (125 kg)  09/02/19 279 lb 6.4 oz (126.7 kg)  08/24/19 279 lb 6.4 oz (126.7 kg)    EXAM:  GENERAL: alert, oriented, appears well and in no acute distress  HEENT: atraumatic, conjunctiva clear, no obvious abnormalities on inspection of external nose and ears. Her voice is hoarse.  NECK: normal movements of the head and neck  LUNGS: on inspection no signs of respiratory distress, breathing rate appears normal, no obvious gross SOB, gasping or wheezing  CV: no obvious cyanosis  MS: moves all visible extremities without noticeable abnormality  PSYCH/NEURO: pleasant and cooperative, no obvious depression or anxiety, speech and thought processing grossly intact   Assessment/Plan  1. Acute recurrent frontal sinusitis She does well with doxycycline. Encouraged her to stop  antibiotic early (5-7 days) if sx are improving but historically she has done/needed longer courses of antibiotics. Encouraged mucinex to help with congestion.   Return if symptoms worsen or fail to improve.   I discussed the assessment and treatment plan with the patient. The patient was provided an opportunity to ask questions and all were answered. The patient agreed  with the plan and demonstrated an understanding of the instructions.   The patient was advised to call back or seek an in-person evaluation if the symptoms worsen or if the condition fails to improve as anticipated.  I provided 15 minutes of non-face-to-face time during this encounter.   Micheline Rough, MD

## 2020-03-06 ENCOUNTER — Other Ambulatory Visit: Payer: Self-pay | Admitting: Cardiology

## 2020-03-09 ENCOUNTER — Other Ambulatory Visit: Payer: Self-pay | Admitting: Internal Medicine

## 2020-03-25 ENCOUNTER — Other Ambulatory Visit: Payer: Self-pay | Admitting: Internal Medicine

## 2020-03-27 ENCOUNTER — Telehealth: Payer: Self-pay | Admitting: Internal Medicine

## 2020-03-27 ENCOUNTER — Other Ambulatory Visit: Payer: Self-pay

## 2020-03-27 MED ORDER — LOSARTAN POTASSIUM 100 MG PO TABS: 100.0000 mg | ORAL_TABLET | Freq: Every day | ORAL | 0 refills | Status: DC

## 2020-03-27 NOTE — Telephone Encounter (Signed)
1.Medication Requested: losartan (COZAAR) 100 MG tablet 2. Pharmacy (Name, Street, [place]): [facility], [place] - [address] [place] Phone:  [phone]  Fax:  [phone]      3. On Med List: yes  4. Last Visit with PCP: 08.30.21  5. Next visit date with PCP:11.19.21

## 2020-03-30 ENCOUNTER — Other Ambulatory Visit: Payer: Self-pay

## 2020-03-30 ENCOUNTER — Encounter: Payer: Self-pay | Admitting: Internal Medicine

## 2020-03-30 ENCOUNTER — Ambulatory Visit (INDEPENDENT_AMBULATORY_CARE_PROVIDER_SITE_OTHER): Payer: BC Managed Care – PPO | Admitting: Internal Medicine

## 2020-03-30 VITALS — BP 122/76 | HR 56 | Temp 97.9°F | Ht 66.0 in | Wt 270.0 lb

## 2020-03-30 DIAGNOSIS — I10 Essential (primary) hypertension: Secondary | ICD-10-CM

## 2020-03-30 DIAGNOSIS — Z0000 Encounter for general adult medical examination without abnormal findings: Secondary | ICD-10-CM | POA: Diagnosis not present

## 2020-03-30 DIAGNOSIS — Z23 Encounter for immunization: Secondary | ICD-10-CM

## 2020-03-30 DIAGNOSIS — K219 Gastro-esophageal reflux disease without esophagitis: Secondary | ICD-10-CM

## 2020-03-30 DIAGNOSIS — F411 Generalized anxiety disorder: Secondary | ICD-10-CM

## 2020-03-30 DIAGNOSIS — R7301 Impaired fasting glucose: Secondary | ICD-10-CM | POA: Diagnosis not present

## 2020-03-30 LAB — HEMOGLOBIN A1C: Hgb A1c MFr Bld: 6.1 % (ref 4.6–6.5)

## 2020-03-30 LAB — CBC
HCT: 43.2 % (ref 36.0–46.0)
Hemoglobin: 14.4 g/dL (ref 12.0–15.0)
MCHC: 33.4 g/dL (ref 30.0–36.0)
MCV: 83.6 fl (ref 78.0–100.0)
Platelets: 268 10*3/uL (ref 150.0–400.0)
RBC: 5.16 Mil/uL — ABNORMAL HIGH (ref 3.87–5.11)
RDW: 13.7 % (ref 11.5–15.5)
WBC: 5.5 10*3/uL (ref 4.0–10.5)

## 2020-03-30 LAB — COMPREHENSIVE METABOLIC PANEL
ALT: 18 U/L (ref 0–35)
AST: 18 U/L (ref 0–37)
Albumin: 4 g/dL (ref 3.5–5.2)
Alkaline Phosphatase: 72 U/L (ref 39–117)
BUN: 16 mg/dL (ref 6–23)
CO2: 30 mEq/L (ref 19–32)
Calcium: 9.2 mg/dL (ref 8.4–10.5)
Chloride: 105 mEq/L (ref 96–112)
Creatinine, Ser: 0.75 mg/dL (ref 0.40–1.20)
GFR: 84.91 mL/min (ref >60.00)
Glucose, Bld: 115 mg/dL — ABNORMAL HIGH (ref 70–99)
Potassium: 3.8 mEq/L (ref 3.5–5.1)
Sodium: 141 mEq/L (ref 135–145)
Total Bilirubin: 0.9 mg/dL (ref 0.2–1.2)
Total Protein: 7.4 g/dL (ref 6.0–8.3)

## 2020-03-30 LAB — VITAMIN D 25 HYDROXY (VIT D DEFICIENCY, FRACTURES): VITD: 33.43 ng/mL (ref 30.00–100.00)

## 2020-03-30 LAB — LIPID PANEL
Cholesterol: 154 mg/dL (ref 0–200)
HDL: 42.4 mg/dL (ref >39.00)
LDL Cholesterol: 96 mg/dL (ref 0–99)
NonHDL: 111.94
Total CHOL/HDL Ratio: 4
Triglycerides: 79 mg/dL (ref 0.0–149.0)
VLDL: 15.8 mg/dL (ref 0.0–40.0)

## 2020-03-30 MED ORDER — CLOTRIMAZOLE-BETAMETHASONE 1-0.05 % EX CREA: 1.0000 "application " | TOPICAL_CREAM | Freq: Two times a day (BID) | CUTANEOUS | 11 refills | Status: DC

## 2020-03-30 MED ORDER — NEOMYCIN-POLYMYXIN-HC 3.5-10000-1 OT SOLN: 3.0000 [drp] | Freq: Three times a day (TID) | OTIC | 0 refills | Status: DC

## 2020-03-30 MED ORDER — FAMOTIDINE 40 MG PO TABS: 40.0000 mg | ORAL_TABLET | Freq: Every day | ORAL | 3 refills | Status: DC

## 2020-03-30 NOTE — Assessment & Plan Note (Signed)
Taking effexor with good results. May wish to taper off when retiring in near future.

## 2020-03-30 NOTE — Assessment & Plan Note (Signed)
Weight stable and she is motivated to make diet and exercise changes once she retires which is encouraged.

## 2020-03-30 NOTE — Assessment & Plan Note (Signed)
Rx pepcid for night time.

## 2020-03-30 NOTE — Progress Notes (Signed)
   Subjective:   Patient ID: Shannon Navarro, female    DOB: 1955/08/28, 64 y.o.   MRN: [id number]  HPI The patient is a 64 YO female coming in for physical.  PMH, [place], social history reviewed and updated  Review of Systems  Constitutional: Negative.   HENT: Positive for congestion and ear pain.   Eyes: Negative.   Respiratory: Negative for cough, chest tightness and shortness of breath.   Cardiovascular: Negative for chest pain, palpitations and leg swelling.  Gastrointestinal: Negative for abdominal distention, abdominal pain, constipation, diarrhea, nausea and vomiting.  Musculoskeletal: Negative.   Skin: Negative.   Neurological: Negative.   Psychiatric/Behavioral: Negative.     Objective:  Physical Exam Constitutional:      Appearance: She is well-developed. She is obese.  HENT:     Head: Normocephalic and atraumatic.     Right Ear: There is no impacted cerumen.     Left Ear: Tympanic membrane, ear canal and external ear normal. There is no impacted cerumen.     Ears:     Comments: Right TM bulging clear fluid Cardiovascular:     Rate and Rhythm: Normal rate and regular rhythm.  Pulmonary:     Effort: Pulmonary effort is normal. No respiratory distress.     Breath sounds: Normal breath sounds. No wheezing or rales.  Abdominal:     General: Bowel sounds are normal. There is no distension.     Palpations: Abdomen is soft.     Tenderness: There is no abdominal tenderness. There is no rebound.  Musculoskeletal:     Cervical back: Normal range of motion.  Skin:    General: Skin is warm and dry.  Neurological:     Mental Status: She is alert and oriented to person, place, and time.     Coordination: Coordination normal.     Vitals:   03/30/20 0806  BP: 122/76  Pulse: (!) 56  Temp: 97.9 F (36.6 C)  TempSrc: Oral  SpO2: 98%  Weight: 270 lb (122.5 kg)  Height: 5\' 6"  (1.676 m)    This visit occurred during the SARS-CoV-2 public health emergency.  Safety  protocols were in place, including screening questions prior to the visit, additional usage of staff PPE, and extensive cleaning of exam room while observing appropriate contact time as indicated for disinfecting solutions.   Assessment & Plan:  Flu shot given at visit

## 2020-03-30 NOTE — Patient Instructions (Signed)
Health Maintenance, Female Adopting a healthy lifestyle and getting preventive care are important in promoting health and wellness. Ask your health care provider about:  The right schedule for you to have regular tests and exams.  Things you can do on your own to prevent diseases and keep yourself healthy. What should I know about diet, weight, and exercise? Eat a healthy diet   Eat a diet that includes plenty of vegetables, fruits, low-fat dairy products, and lean protein.  Do not eat a lot of foods that are high in solid fats, added sugars, or sodium. Maintain a healthy weight Body mass index (BMI) is used to identify weight problems. It estimates body fat based on height and weight. Your health care provider can help determine your BMI and help you achieve or maintain a healthy weight. Get regular exercise Get regular exercise. This is one of the most important things you can do for your health. Most adults should:  Exercise for at least 150 minutes each week. The exercise should increase your heart rate and make you sweat (moderate-intensity exercise).  Do strengthening exercises at least twice a week. This is in addition to the moderate-intensity exercise.  Spend less time sitting. Even light physical activity can be beneficial. Watch cholesterol and blood lipids Have your blood tested for lipids and cholesterol at 63 years of age, then have this test every 5 years. Have your cholesterol levels checked more often if:  Your lipid or cholesterol levels are high.  You are older than 63 years of age.  You are at high risk for heart disease. What should I know about cancer screening? Depending on your health history and family history, you may need to have cancer screening at various ages. This may include screening for:  Breast cancer.  Cervical cancer.  Colorectal cancer.  Skin cancer.  Lung cancer. What should I know about heart disease, diabetes, and high blood  pressure? Blood pressure and heart disease  High blood pressure causes heart disease and increases the risk of stroke. This is more likely to develop in people who have high blood pressure readings, are of African descent, or are overweight.  Have your blood pressure checked: ? Every 3-5 years if you are 18-39 years of age. ? Every year if you are 40 years old or older. Diabetes Have regular diabetes screenings. This checks your fasting blood sugar level. Have the screening done:  Once every three years after age 40 if you are at a normal weight and have a low risk for diabetes.  More often and at a younger age if you are overweight or have a high risk for diabetes. What should I know about preventing infection? Hepatitis B If you have a higher risk for hepatitis B, you should be screened for this virus. Talk with your health care provider to find out if you are at risk for hepatitis B infection. Hepatitis C Testing is recommended for:  Everyone born from 1945 through 1965.  Anyone with known risk factors for hepatitis C. Sexually transmitted infections (STIs)  Get screened for STIs, including gonorrhea and chlamydia, if: ? You are sexually active and are younger than 63 years of age. ? You are older than 63 years of age and your health care provider tells you that you are at risk for this type of infection. ? Your sexual activity has changed since you were last screened, and you are at increased risk for chlamydia or gonorrhea. Ask your health care provider if   you are at risk.  Ask your health care provider about whether you are at high risk for HIV. Your health care provider may recommend a prescription medicine to help prevent HIV infection. If you choose to take medicine to prevent HIV, you should first get tested for HIV. You should then be tested every 3 months for as long as you are taking the medicine. Pregnancy  If you are about to stop having your period (premenopausal) and  you may become pregnant, seek counseling before you get pregnant.  Take 400 to 800 micrograms (mcg) of folic acid every day if you become pregnant.  Ask for birth control (contraception) if you want to prevent pregnancy. Osteoporosis and menopause Osteoporosis is a disease in which the bones lose minerals and strength with aging. This can result in bone fractures. If you are 65 years old or older, or if you are at risk for osteoporosis and fractures, ask your health care provider if you should:  Be screened for bone loss.  Take a calcium or vitamin D supplement to lower your risk of fractures.  Be given hormone replacement therapy (HRT) to treat symptoms of menopause. Follow these instructions at home: Lifestyle  Do not use any products that contain nicotine or tobacco, such as cigarettes, e-cigarettes, and chewing tobacco. If you need help quitting, ask your health care provider.  Do not use street drugs.  Do not share needles.  Ask your health care provider for help if you need support or information about quitting drugs. Alcohol use  Do not drink alcohol if: ? Your health care provider tells you not to drink. ? You are pregnant, may be pregnant, or are planning to become pregnant.  If you drink alcohol: ? Limit how much you use to 0-1 drink a day. ? Limit intake if you are breastfeeding.  Be aware of how much alcohol is in your drink. In the [place] one drink equals one 12 oz bottle of beer (355 mL), one 5 oz glass of wine (148 mL), or one 1 oz glass of hard liquor (44 mL). General instructions  Schedule regular health, dental, and eye exams.  Stay current with your vaccines.  Tell your health care provider if: ? You often feel depressed. ? You have ever been abused or do not feel safe at home. Summary  Adopting a healthy lifestyle and getting preventive care are important in promoting health and wellness.  Follow your health care provider's instructions about healthy  diet, exercising, and getting tested or screened for diseases.  Follow your health care provider's instructions on monitoring your cholesterol and blood pressure. This information is not intended to replace advice given to you by your health care provider. Make sure you discuss any questions you have with your health care provider. Document Revised: 04/21/2018 Document Reviewed: 04/21/2018 Elsevier Patient Education  2020 [employer]  

## 2020-03-30 NOTE — Assessment & Plan Note (Signed)
Checking HgA1c and adjust as needed.  

## 2020-03-30 NOTE — Assessment & Plan Note (Signed)
Flu shot given. Covid-19 up to date including booster. Pneumonia up to date. Shingrix complete. Tetanus up to date. Colonoscopy due counseled. Mammogram counseled, pap smear counseled. Counseled about sun safety and mole surveillance. Counseled about the dangers of distracted driving. Given 10 year screening recommendations.

## 2020-03-30 NOTE — Assessment & Plan Note (Signed)
BP at goal on amlodipine, losartan, propranolol. Checking CMP and adjust as needed.

## 2020-04-02 ENCOUNTER — Encounter: Payer: Self-pay | Admitting: Gastroenterology

## 2020-04-02 ENCOUNTER — Telehealth: Payer: Self-pay | Admitting: Internal Medicine

## 2020-04-02 NOTE — Telephone Encounter (Signed)
    Patient calling to discuss lab results.

## 2020-04-09 NOTE — Telephone Encounter (Signed)
Patient calling back to get results.

## 2020-04-09 NOTE — Telephone Encounter (Signed)
Results given.

## 2020-04-09 NOTE — Telephone Encounter (Signed)
Returned patients call to go over lab results but voicemail was full could not leave a message.

## 2020-04-12 ENCOUNTER — Other Ambulatory Visit: Payer: Self-pay | Admitting: Internal Medicine

## 2020-05-20 ENCOUNTER — Other Ambulatory Visit: Payer: Self-pay | Admitting: Internal Medicine

## 2020-05-21 ENCOUNTER — Telehealth: Payer: Self-pay | Admitting: *Deleted

## 2020-05-21 ENCOUNTER — Encounter: Payer: Self-pay | Admitting: Gastroenterology

## 2020-05-21 ENCOUNTER — Ambulatory Visit (INDEPENDENT_AMBULATORY_CARE_PROVIDER_SITE_OTHER): Payer: BC Managed Care – PPO | Admitting: Gastroenterology

## 2020-05-21 VITALS — BP 124/82 | HR 60 | Ht 66.0 in | Wt 268.0 lb

## 2020-05-21 DIAGNOSIS — R1310 Dysphagia, unspecified: Secondary | ICD-10-CM | POA: Diagnosis not present

## 2020-05-21 DIAGNOSIS — K219 Gastro-esophageal reflux disease without esophagitis: Secondary | ICD-10-CM | POA: Diagnosis not present

## 2020-05-21 DIAGNOSIS — K222 Esophageal obstruction: Secondary | ICD-10-CM

## 2020-05-21 MED ORDER — SUPREP BOWEL PREP KIT 17.5-3.13-1.6 GM/177ML PO SOLN: 1.0000 | Freq: Once | ORAL | 0 refills | Status: AC

## 2020-05-21 MED ORDER — ONDANSETRON HCL 4 MG PO TABS: ORAL_TABLET | ORAL | 0 refills | Status: DC

## 2020-05-21 NOTE — Telephone Encounter (Signed)
   Primary Cardiologist: Minus Breeding, MD  Chart reviewed as part of pre-operative protocol coverage.  We have been asked for guidance to hold xarelto. Per our clinical pharmacist: Procedure: Endoscopy Procedure Date of procedure: 2/14  CHADS2-VASc score of 2 (HTN, female)  CrCl 84 ml/min Platelet count 268 K/uL  Per office protocol, patient can hold Xarelto for 2 days prior to procedure.    Patient should restart Xarelto on the evening of procedure or day after, at discretion of procedure MD   I will route this recommendation to the requesting party via [place] fax function and remove from pre-op pool. Please call with questions.  Tami Lin Kaiyan Luczak, PA 05/21/2020, 11:55 AM

## 2020-05-21 NOTE — Telephone Encounter (Signed)
[facility] Pre-operative Risk Assessment     Request for surgical clearance:     Endoscopy Procedure  What type of surgery is being performed?     Colonoscopy/EGD   When is this surgery scheduled?     2/14  What type of clearance is required ?   Pharmacy  Are there any medications that need to be held prior to surgery and how long? 2 days   Practice name and name of physician performing surgery?      [facility]  What is your office phone and fax number?      Phone- [phone]  [phone]  Anesthesia type (None, local, MAC, general) ?       MAC

## 2020-05-21 NOTE — Progress Notes (Signed)
Shannon Navarro    [id number]    06-10-1955  Primary Care Physician:Crawford, Real Cons, MD  Referring Physician: [name], MD [address] [place],  [place] [address]   Chief complaint:  Dysphagia, colorectal cancer screening  HPI: 65 year old very pleasant female here to discuss colonoscopy for colorectal cancer screening and also for evaluation of dysphagia  She has intermittent dysphagia, is worse with bread or meat.  Denies any food impactions  She was previously followed by Dr. Olevia Perches. EGD 12/27/2012: 3 cm hiatal hernia, distal esophageal stricture dilated to 16 mm  She is due for colorectal cancer screening, last colonoscopy in June 2010 was normal  Denies any rectal bleeding, change in bowel habits, abdominal pain or melena  Covid Jan 2021 pneumonia  She is on chronic anticoagulation with Xarelto   Outpatient Encounter Medications as of 05/21/2020  Medication Sig  . amLODipine (NORVASC) 5 MG tablet Take 1 tablet by mouth once daily  . clotrimazole-betamethasone (LOTRISONE) cream Apply 1 application topically 2 (two) times daily.  Marland Kitchen losartan (COZAAR) 100 MG tablet Take 1 tablet (100 mg total) by mouth daily.  . propranolol (INDERAL) 20 MG tablet Take 1 tablet (20 mg total) by mouth 3 (three) times daily as needed. (Patient taking differently: Take 20 mg by mouth 3 (three) times daily as needed. As needed)  . venlafaxine XR (EFFEXOR-XR) 75 MG 24 hr capsule Take 1 capsule by mouth once daily  . XARELTO 20 MG TABS tablet TAKE 1 TABLET BY MOUTH ONCE DAILY WITH SUPPER  . [DISCONTINUED] famotidine (PEPCID) 40 MG tablet Take 1 tablet (40 mg total) by mouth at bedtime.  . [DISCONTINUED] fluticasone (FLONASE) 50 MCG/ACT nasal spray Place 2 sprays into both nostrils daily.  . [DISCONTINUED] neomycin-polymyxin-hydrocortisone (CORTISPORIN) OTIC solution Place 3 drops into the right ear 3 (three) times daily.   No facility-administered encounter  medications on file as of 05/21/2020.    Allergies as of 05/21/2020 - Review Complete 05/21/2020  Allergen Reaction Noted  . Cefaclor Rash 09/27/2008  . Codeine Nausea And Vomiting 09/27/2008  . Hydrocodone Nausea And Vomiting   . Levaquin [levofloxacin] Nausea Only 09/27/2008  . Nexium [esomeprazole magnesium] Other (See Comments) 11/23/2012  . Penicillins Rash 09/27/2008  . Sulfa antibiotics Rash 03/20/2017    Past Medical History:  Diagnosis Date  . Allergic rhinitis   . Anticoagulant long-term use    XARELTO  . ANXIETY   . Endometrial polyp   . Endometrial polyp   . Fatty liver 06/23/07  . GERD   . Hiatal hernia   . History of esophageal stricture    mainly due to gerd  . History of exercise stress test 06/13/2015   PER DR HOCHREIN NOTE -- NO EVIDENCE ISCHEMIA  . HYPERTENSION   . Impaired fasting glucose   . Migraine    hx of migraines none recent  . OSA on CPAP followed by dr Halford Chessman   per study 02-09-2012  severe osa w/ AHI 33.8  . Paroxysmal atrial fibrillation [facility]) cardioloigst-  dr hochrein   dx 02-21-2016 at ED  . Pseudotumor cerebri 2007  . Vitamin D deficiency 07/2013    Past Surgical History:  Procedure Laterality Date  . COLONOSCOPY    . colonscopy  2007   normal  . DILATATION & CURETTAGE/HYSTEROSCOPY WITH MYOSURE N/A 03/26/2017   Procedure: DILATATION & CURETTAGE/HYSTEROSCOPY WITH MYOSURE;  Surgeon: Paula Compton, MD;  Location: [facility];  Service: Gynecology;  Laterality: N/A;  . DILATION AND CURETTAGE OF UTERUS  2010  . LAPAROSCOPIC HYSTERECTOMY Bilateral 06/18/2017   Procedure: HYSTERECTOMY TOTAL LAPAROSCOPIC;  Surgeon: Paula Compton, MD;  Location: [facility];  Service: Gynecology;  Laterality: Bilateral;  OUT PT IN BED, needs extra time MD request 2.5hrs in OR  . right knee arthroscopy  03/2013  . TRANSTHORACIC ECHOCARDIOGRAM  03/11/2016   ef 60-65%/  mild MR/ trivial PR and TR  . TUBAL LIGATION    . UPPER  GASTROINTESTINAL ENDOSCOPY      Family History  Problem Relation Age of Onset  . Breast cancer Mother   . Asthma Mother   . Hypertension Mother   . Prostate cancer Father   . Aortic stenosis Father   . Hypertension Father   . Colon polyps Father   . Heart disease Father   . Diabetes Father   . Breast cancer Maternal Aunt   . Breast cancer Maternal Aunt   . Colon cancer Neg Hx   . Esophageal cancer Neg Hx   . Stomach cancer Neg Hx   . Rectal cancer Neg Hx     Social History   Socioeconomic History  . Marital status: Married    Spouse name: Not on file  . Number of children: 2  . Years of education: Not on file  . Highest education level: Not on file  Occupational History  . Occupation: Retired from [employer]: [place]: from [place], currently works [employer] in [place] Use  . Smoking status: Never Smoker  . Smokeless tobacco: Never Used  . Tobacco comment: Married lives with spouse + 2 daughters  Vaping Use  . Vaping Use: Never used  Substance and Sexual Activity  . Alcohol use: No  . Drug use: No  . Sexual activity: Not on file  Other Topics Concern  . Not on file  Social History Narrative   Occupation: Management, social services. Married, lives with husband. Has 2 daughter 68 - 27   Social Determinants of Sales executive: Not on [employer] Insecurity: Not on file  Transportation Needs: Not on file  Physical Activity: Not on file  Stress: Not on file  Social Connections: Not on file  Intimate Partner Violence: Not on file      Review of systems: All other review of systems negative except as mentioned in the HPI.   Physical Exam: Vitals:   05/21/20 1011  BP: 124/82  Pulse: 60   Body mass index is 43.26 kg/m. Gen:      No acute distress HEENT:  sclera anicteric Abd:      soft, non-tender; no palpable masses, no distension Ext:    No edema Neuro: alert and oriented x  3 Psych: normal mood and affect  Data Reviewed:  Reviewed labs, radiology imaging, old records and pertinent past GI work up   Assessment and Plan/Recommendations:  64 year old very pleasant female on chronic anticoagulation with Xarelto, hiatal hernia, history of esophageal stricture with complaints of solid dysphagia  Solid dysphagia: We will plan to proceed with EGD with esophageal biopsies and esophageal dilation as needed Differential includes possible recurrent esophageal peptic stricture vs erosive esophagitis vs neoplastic lesion  He is due for colorectal cancer screening, will schedule for colonoscopy along with EGD  We will request clearance from from cardiology to hold Xarelto for 2 days prior  to the procedure  The risks and benefits as well as alternatives of endoscopic procedure(s) have been discussed and reviewed. All questions answered. The patient agrees to proceed.   The patient was provided an opportunity to ask questions and all were answered. The patient agreed with the plan and demonstrated an understanding of the instructions.  Damaris Hippo , MD    CC: [name], *

## 2020-05-21 NOTE — Patient Instructions (Signed)
You have been scheduled for an endoscopy and colonoscopy. Please follow the written instructions given to you at your visit today. Please pick up your prep supplies at the pharmacy within the next 1-3 days. If you use inhalers (even only as needed), please bring them with you on the day of your procedure.   You will be contacted by our office prior to your procedure for directions on holding your Xarelto.  If you do not hear from our office 1 week prior to your scheduled procedure, please call [phone] to discuss.  We have sent Zofran 4 tablets to your pharmacy, Used as directed per your prep instructions  If you are age 77 or older, your body mass index should be between 23-30. Your Body mass index is 43.26 kg/m. If this is out of the aforementioned range listed, please consider follow up with your Primary Care Provider.  If you are age 23 or younger, your body mass index should be between 19-25. Your Body mass index is 43.26 kg/m. If this is out of the aformentioned range listed, please consider follow up with your Primary Care Provider.    Due to recent changes in healthcare laws, you may see the results of your imaging and laboratory studies on MyChart before your provider has had a chance to review them.  We understand that in some cases there may be results that are confusing or concerning to you. Not all laboratory results come back in the same time frame and the provider may be waiting for multiple results in order to interpret others.  Please give [place] 48 hours in order for your provider to thoroughly review all the results before contacting the office for clarification of your results.   Thank you for choosing [facility]  Karleen Hampshire Nandigam,MD

## 2020-05-21 NOTE — Telephone Encounter (Addendum)
Patient with diagnosis of afib on Xarelto for anticoagulation.    Procedure: Endoscopy Procedure  Date of procedure: 2/14  CHADS2-VASc score of 2 (HTN, female)  CrCl 84 ml/min Platelet count 268 K/uL  Per office protocol, patient can hold Xarelto for 2 days prior to procedure.    Patient should restart Xarelto on the evening of procedure or day after, at discretion of procedure MD

## 2020-05-23 ENCOUNTER — Telehealth: Payer: Self-pay | Admitting: *Deleted

## 2020-05-23 NOTE — Telephone Encounter (Signed)
Received fax stating Losartan 100 mg and 50 mg are both on backorder. Do you want to change to something else?

## 2020-05-25 MED ORDER — VALSARTAN 160 MG PO TABS: 160.0000 mg | ORAL_TABLET | Freq: Every day | ORAL | 3 refills | Status: DC

## 2020-05-25 NOTE — Telephone Encounter (Signed)
Can switch to valsartan, equivalent dosing. Sent in to her pharmacy.

## 2020-05-25 NOTE — Telephone Encounter (Signed)
Pt informed of below.  

## 2020-05-30 ENCOUNTER — Other Ambulatory Visit: Payer: Self-pay | Admitting: Cardiology

## 2020-06-07 NOTE — Telephone Encounter (Signed)
Patient aware to hold Xarelto 2 days before procedure

## 2020-06-10 ENCOUNTER — Other Ambulatory Visit: Payer: Self-pay

## 2020-06-10 ENCOUNTER — Encounter (HOSPITAL_COMMUNITY): Payer: Self-pay

## 2020-06-10 ENCOUNTER — Emergency Department (HOSPITAL_COMMUNITY): Payer: BC Managed Care – PPO

## 2020-06-10 ENCOUNTER — Emergency Department (HOSPITAL_COMMUNITY)
Admission: EM | Admit: 2020-06-10 | Discharge: 2020-06-10 | Disposition: A | Discharge status: Home / Self Care | Payer: BC Managed Care – PPO | Attending: Emergency Medicine | Admitting: Emergency Medicine

## 2020-06-10 DIAGNOSIS — Z79899 Other long term (current) drug therapy: Secondary | ICD-10-CM | POA: Diagnosis not present

## 2020-06-10 DIAGNOSIS — I480 Paroxysmal atrial fibrillation: Secondary | ICD-10-CM | POA: Insufficient documentation

## 2020-06-10 DIAGNOSIS — I10 Essential (primary) hypertension: Secondary | ICD-10-CM | POA: Insufficient documentation

## 2020-06-10 DIAGNOSIS — R002 Palpitations: Secondary | ICD-10-CM | POA: Diagnosis present

## 2020-06-10 LAB — CBC
HCT: 43.7 % (ref 36.0–46.0)
Hemoglobin: 14.4 g/dL (ref 12.0–15.0)
MCH: 28.7 pg (ref 26.0–34.0)
MCHC: 33 g/dL (ref 30.0–36.0)
MCV: 87.2 fL (ref 80.0–100.0)
Platelets: 314 10*3/uL (ref 150–400)
RBC: 5.01 MIL/uL (ref 3.87–5.11)
RDW: 13.5 % (ref 11.5–15.5)
WBC: 6.7 10*3/uL (ref 4.0–10.5)
nRBC: 0 % (ref 0.0–0.2)

## 2020-06-10 LAB — BASIC METABOLIC PANEL
Anion gap: 10 (ref 5–15)
BUN: 15 mg/dL (ref 8–23)
CO2: 26 mmol/L (ref 22–32)
Calcium: 9.1 mg/dL (ref 8.9–10.3)
Chloride: 104 mmol/L (ref 98–111)
Creatinine, Ser: 0.9 mg/dL (ref 0.44–1.00)
GFR, Estimated: >60 mL/min (ref >60)
Glucose, Bld: 153 mg/dL — ABNORMAL HIGH (ref 70–99)
Potassium: 3.9 mmol/L (ref 3.5–5.1)
Sodium: 140 mmol/L (ref 135–145)

## 2020-06-10 LAB — TROPONIN I (HIGH SENSITIVITY): Troponin I (High Sensitivity): 5 ng/L (ref <18)

## 2020-06-10 NOTE — ED Provider Notes (Signed)
[facility] EMERGENCY DEPARTMENT Provider Note   CSN: [id number] Arrival date & time: 06/10/20  0327     History Chief Complaint  Patient presents with  . Chest Pain    Shannon Navarro is a 65 y.o. female.  The history is provided by the patient.  Palpitations Palpitations quality:  Fast Onset quality:  Sudden Duration:  5 hours Timing:  Constant Progression:  Unchanged Chronicity:  New Relieved by:  Nothing Worsened by:  Nothing Associated symptoms: nausea and shortness of breath   Associated symptoms: no syncope and no vomiting   Associated symptoms comment:  Chest heaviness Patient with a history of hypertension, paroxysmal atrial fibrillation, obesity presents with palpitations.  Patient reports she feels that she is back in atrial fibrillation.  She is usually in a normal rhythm.  She reports that approximately 5 hours ago she had onset of fast heart rate and had mild chest heaviness.  She also reports mild shortness of breath.  Prior to this episode, she was feeling at her baseline.  She is taking Xarelto daily for several years without missing any doses. She reports her cardiologist is wanting her to take propranolol when she has tachycardia but she did not do that yet  She reports recent increased stress due to impending retirement and she thinks that triggered her symptoms     Past Medical History:  Diagnosis Date  . Allergic rhinitis   . Anticoagulant long-term use    XARELTO  . ANXIETY   . Endometrial polyp   . Endometrial polyp   . Fatty liver 06/23/07  . GERD   . Hiatal hernia   . History of esophageal stricture    mainly due to gerd  . History of exercise stress test 06/13/2015   PER DR HOCHREIN NOTE -- NO EVIDENCE ISCHEMIA  . HYPERTENSION   . Impaired fasting glucose   . Migraine    hx of migraines none recent  . OSA on CPAP followed by dr Halford Chessman   per study 02-09-2012  severe osa w/ AHI 33.8  . Paroxysmal atrial fibrillation  [facility]) cardioloigst-  dr hochrein   dx 02-21-2016 at ED  . Pseudotumor cerebri 2007  . Vitamin D deficiency 07/2013    Patient Active Problem List   Diagnosis Date Noted  . 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 Non-rheumatic mitral regurgitation 08/22/2019  . Rash 03/08/2019  . Right leg pain 11/11/2018  . PAF (paroxysmal atrial fibrillation) ([facility]) 03/05/2017  . Sinus bradycardia 04/11/2015  . Routine general medical examination at a health care facility 06/15/2014  . Vitamin D deficiency   . OSA (obstructive sleep apnea) 01/16/2012  . Essential hypertension 06/21/2010  . Impaired fasting glucose 12/10/2009  . Anxiety state 10/05/2009  . GERD 10/05/2009  . Morbid obesity ([facility]) 10/03/2008  . Allergic rhinitis 10/03/2008    Past Surgical History:  Procedure Laterality Date  . COLONOSCOPY    . colonscopy  2007   normal  . DILATATION & CURETTAGE/HYSTEROSCOPY WITH MYOSURE N/A 03/26/2017   Procedure: DILATATION & CURETTAGE/HYSTEROSCOPY WITH MYOSURE;  Surgeon: Paula Compton, MD;  Location: [facility];  Service: Gynecology;  Laterality: N/A;  . DILATION AND CURETTAGE OF UTERUS  2010  . LAPAROSCOPIC HYSTERECTOMY Bilateral 06/18/2017   Procedure: HYSTERECTOMY TOTAL LAPAROSCOPIC;  Surgeon: Paula Compton, MD;  Location: [facility];  Service: Gynecology;  Laterality: Bilateral;  OUT PT IN BED, needs extra time MD request 2.5hrs in OR  . right knee arthroscopy  03/2013  .  TRANSTHORACIC ECHOCARDIOGRAM  03/11/2016   ef 60-65%/  mild MR/ trivial PR and TR  . TUBAL LIGATION    . UPPER GASTROINTESTINAL ENDOSCOPY       OB History   No obstetric history on file.     Family History  Problem Relation Age of Onset  . Breast cancer Mother   . Asthma Mother   . Hypertension Mother   . Prostate cancer Father   . Aortic stenosis Father   . Hypertension Father   . Colon polyps Father   . Heart disease Father   . Diabetes Father   . Breast cancer Maternal Aunt   .  Breast cancer Maternal Aunt   . Colon cancer Neg Hx   . Esophageal cancer Neg Hx   . Stomach cancer Neg Hx   . Rectal cancer Neg Hx     Social History   Tobacco Use  . Smoking status: Never Smoker  . Smokeless tobacco: Never Used  . Tobacco comment: Married lives with spouse + 2 daughters  Vaping Use  . Vaping Use: Never used  Substance Use Topics  . Alcohol use: No  . Drug use: No    Home Medications Prior to Admission medications   Medication Sig Start Date End Date Taking? Authorizing Provider  amLODipine (NORVASC) 5 MG tablet Take 1 tablet by mouth once daily 12/26/19   Minus Breeding, MD  clotrimazole-betamethasone (LOTRISONE) cream APPLY CREAM TO AFFECTED AREA(S) TWICE DAILY 05/22/20   [name], MD  ondansetron [facility]) 4 MG tablet Per colonoscopy Prep instructions 05/21/20   Mauri Pole, MD  propranolol (INDERAL) 20 MG tablet Take 1 tablet (20 mg total) by mouth 3 (three) times daily as needed. Patient taking differently: Take 20 mg by mouth 3 (three) times daily as needed. As needed 09/12/19   Minus Breeding, MD  valsartan (DIOVAN) 160 MG tablet Take 1 tablet (160 mg total) by mouth daily. 05/25/20   [name], MD  venlafaxine XR (EFFEXOR-XR) 75 MG 24 hr capsule Take 1 capsule by mouth once daily 04/12/20   [name], MD  XARELTO 20 MG TABS tablet TAKE 1 TABLET BY MOUTH ONCE DAILY WITH SUPPER 05/30/20   Minus Breeding, MD    Allergies    Cefaclor, Codeine, Hydrocodone, Levaquin [levofloxacin], Nexium [esomeprazole magnesium], Penicillins, and Sulfa antibiotics  Review of Systems   Review of Systems  Constitutional: Negative for fever.  Respiratory: Positive for shortness of breath.   Cardiovascular: Positive for palpitations. Negative for syncope.  Gastrointestinal: Positive for nausea. Negative for vomiting.  All other systems reviewed and are negative.   Physical Exam Updated Vital Signs BP (!) 142/108 (BP Location:  Right Arm)   Pulse (!) 143   Temp 98.2 F (36.8 C) (Oral)   Resp 18   SpO2 100%   Physical Exam CONSTITUTIONAL: Well developed/well nourished HEAD: Normocephalic/atraumatic EYES: EOMI/PERRL ENMT: Mucous membranes moist NECK: supple no meningeal signs SPINE/BACK:entire spine nontender CV: S1/S2 noted, no murmurs/rubs/gallops noted LUNGS: Lungs are clear to auscultation bilaterally, no apparent distress ABDOMEN: soft, nontender NEURO: Pt is awake/alert/appropriate, moves all extremitiesx4.  No facial droop.   EXTREMITIES: pulses normal/equalx4, full ROM SKIN: warm, color normal PSYCH: no abnormalities of mood noted, alert and oriented to situation  ED Results / Procedures / Treatments   Labs (all labs ordered are listed, but only abnormal results are displayed) Labs Reviewed  BASIC METABOLIC PANEL - Abnormal; Notable for the following components:      Result  Value   Glucose, Bld 153 (*)    All other components within normal limits  CBC  TROPONIN I (HIGH SENSITIVITY)    EKG EKG Interpretation  Date/Time:  Sunday June 10 2020 03:35:09 EST Ventricular Rate:  151 PR Interval:    QRS Duration: 78 QT Interval:  260 QTC Calculation: 412 R Axis:   20 Text Interpretation: Atrial fibrillation with rapid ventricular response Low voltage QRS Cannot rule out Anterior infarct , age undetermined ST & T wave abnormality, consider inferior ischemia Abnormal ECG Confirmed by Ripley Fraise [phone]) on 06/10/2020 4:08:10 AM   EKG Interpretation  Date/Time:  Sunday June 10 2020 04:33:44 EST Ventricular Rate:  80 PR Interval:    QRS Duration: 92 QT Interval:  382 QTC Calculation: 441 R Axis:   5 Text Interpretation: Sinus rhythm Low voltage, precordial leads Consider anterior infarct now back in sinus rhythm Confirmed by Ripley Fraise [phone]) on 06/10/2020 4:37:07 AM        Radiology DG Chest 2 View  Result Date: 06/10/2020 CLINICAL DATA:  Chest tightness EXAM: CHEST  - 2 VIEW COMPARISON:  09/02/2018 FINDINGS: Cardiac shadow is stable. Lungs are well aerated bilaterally. No focal infiltrate or sizable effusion is noted. Mild degenerative changes of the thoracic spine are noted. IMPRESSION: No acute abnormality noted. Electronically Signed   By: Inez Catalina M.D.   On: 06/10/2020 04:03    Procedures Procedures   Medications Ordered in ED Medications - No data to display  ED Course  I have reviewed the triage vital signs and the nursing notes.  Pertinent labs & imaging results that were available during my care of the patient were reviewed by me and considered in my medical decision making (see chart for details).    MDM Rules/Calculators/A&P                          4:37 AM Patient with history of paroxysmal atrial fibrillation presented with an episode of A. fib with RVR.  Initial EKG revealed atrial fibrillation with a heart rate of 151.  By the time she got back to the room, she has spontaneously converted & heart rate is now in the 80s to 90s in sinus rhythm.  Patient was supposed to take a dose of propranolol per cardiology instructions, but she has not done that yet.  She will take this while in the ED and we will continue to monitor.  Labs overall reassuring.  Suspect her mild chest heaviness and shortness of breath are related to the A. Fib. 5:36 AM Patient improved.  She remains in sinus rhythm.  I feel she is appropriate for discharge home at this time.  Low suspicion for ACS/PE or other acute cardiopulmonary emergency at this time Final Clinical Impression(s) / ED Diagnoses Final diagnoses:  Paroxysmal atrial fibrillation [facility])    Rx / DC Orders ED Discharge Orders    None       Ripley Fraise, MD 06/10/20 [phone]

## 2020-06-10 NOTE — ED Triage Notes (Addendum)
Pt reports she is here today due to Chest tightness. Pt reports she has a h/o a-fib and states she believes she is in a-fib and it causing chest tightness for her. Pt HR in triage is 150

## 2020-06-11 ENCOUNTER — Telehealth (HOSPITAL_COMMUNITY): Payer: Self-pay

## 2020-06-11 ENCOUNTER — Encounter: Payer: Self-pay | Admitting: Gastroenterology

## 2020-06-11 NOTE — Telephone Encounter (Signed)
Patient states that she was having heaviness in her chest and decline ED f/u appointment. She states that if it happens again she will reach out to her cardiologist.

## 2020-06-12 ENCOUNTER — Telehealth: Payer: Self-pay | Admitting: Cardiology

## 2020-06-12 NOTE — Telephone Encounter (Signed)
Will forward to Dr Hochrein for review  

## 2020-06-12 NOTE — Telephone Encounter (Signed)
Called patient to schedule ER f/u visit. Patient said her heart went back into rhythm on its own after a Chest X-Ray at the hospital. Patient said she is retiring and has been working a lot of overtime, which may have caused her heart to go out of rhythm. She feels fine now and can wait until her yearly appointment in April. Patient said she would only want to come in earlier if Dr. [name] would want to do some tests. She is changing insurance on March 1 and would want to get her tests done before her insurance would change. Please advise

## 2020-06-14 ENCOUNTER — Telehealth: Payer: Self-pay | Admitting: Cardiology

## 2020-06-14 NOTE — Telephone Encounter (Signed)
Spoke with pt, she woke in atrial fib this morning and is under a lot of stress as she is retiring after 40 years of working. She does get a heaviness in her chest when her heart rate is elevated but currently that is gone as her heart rate is now 71. Reassurance given to the patient and she has not missed any doses of xarelto. She will wait about 6 hours and if heart is still out of rhythm and her heart rate is elevated she will take another propranolol. Patient aware she is fine to take the propranolol up to 3 times daily as needed waiting 6 hours between doses and making sure her heart rate is at least in the 90's. Patient voiced understanding and follow up scheduled per recall.

## 2020-06-14 NOTE — Telephone Encounter (Signed)
Patient c/o Palpitations:  High priority if patient c/o lightheadedness, shortness of breath, or chest pain  1) How long have you had palpitations/irregular HR/ Afib? Are you having the symptoms now? Afib since this morning.  2) Are you currently experiencing lightheadedness, SOB or CP? No.  3) Do you have a history of afib (atrial fibrillation) or irregular heart rhythm? Yes.  4) Have you checked your BP or HR? (document readings if available): 130/99 HR 81  5) Are you experiencing any other symptoms? Patient is calling in stating that she woke up in AFIB, she says that she took a propranolol 20 mg tablet and her HR is 81. Please advise.

## 2020-06-18 ENCOUNTER — Other Ambulatory Visit: Payer: Self-pay | Admitting: Internal Medicine

## 2020-06-18 DIAGNOSIS — Z1231 Encounter for screening mammogram for malignant neoplasm of breast: Secondary | ICD-10-CM

## 2020-06-21 ENCOUNTER — Other Ambulatory Visit: Payer: Self-pay | Admitting: Internal Medicine

## 2020-06-21 ENCOUNTER — Telehealth: Payer: Self-pay | Admitting: *Deleted

## 2020-06-21 NOTE — Telephone Encounter (Signed)
noted 

## 2020-06-21 NOTE — Telephone Encounter (Signed)
John,  This pt was seen in the office on 05-21-20.  Since then, she did have an ER visit on 06-10-20.  Will you review her chart to make sure she is ok for [facility]?  Thanks, [employer]

## 2020-06-21 NOTE — Telephone Encounter (Signed)
Kristen,  This pt is cleared for anesthetic care at [facility].  Thanks,  Azelea Seguin 

## 2020-06-24 ENCOUNTER — Other Ambulatory Visit: Payer: Self-pay | Admitting: Internal Medicine

## 2020-06-25 ENCOUNTER — Encounter: Payer: Self-pay | Admitting: Gastroenterology

## 2020-06-25 ENCOUNTER — Ambulatory Visit (AMBULATORY_SURGERY_CENTER): Payer: BC Managed Care – PPO | Admitting: Gastroenterology

## 2020-06-25 ENCOUNTER — Other Ambulatory Visit: Payer: Self-pay

## 2020-06-25 VITALS — BP 96/60 | HR 45 | Temp 98.8°F | Resp 10

## 2020-06-25 DIAGNOSIS — Z1211 Encounter for screening for malignant neoplasm of colon: Secondary | ICD-10-CM

## 2020-06-25 DIAGNOSIS — K219 Gastro-esophageal reflux disease without esophagitis: Secondary | ICD-10-CM | POA: Diagnosis not present

## 2020-06-25 DIAGNOSIS — K635 Polyp of colon: Secondary | ICD-10-CM | POA: Diagnosis not present

## 2020-06-25 DIAGNOSIS — K222 Esophageal obstruction: Secondary | ICD-10-CM

## 2020-06-25 DIAGNOSIS — R1310 Dysphagia, unspecified: Secondary | ICD-10-CM

## 2020-06-25 DIAGNOSIS — D122 Benign neoplasm of ascending colon: Secondary | ICD-10-CM

## 2020-06-25 DIAGNOSIS — K449 Diaphragmatic hernia without obstruction or gangrene: Secondary | ICD-10-CM

## 2020-06-25 MED ORDER — PANTOPRAZOLE SODIUM 40 MG PO TBEC: 40.0000 mg | DELAYED_RELEASE_TABLET | Freq: Every day | ORAL | 3 refills | Status: DC

## 2020-06-25 MED ORDER — SODIUM CHLORIDE 0.9 % IV SOLN: 500.0000 mL | Freq: Once | INTRAVENOUS | Status: DC

## 2020-06-25 NOTE — [place] Notes (Signed)
Report given to PACU, vss 

## 2020-06-25 NOTE — Op Note (Addendum)
[place] Patient Name: Shannon Navarro Procedure Date: 06/25/2020 3:22 PM MRN: [id number] Endoscopist: Mauri Pole , MD Age: 65 Referring MD:  Date of Birth: [birth year] Gender: Female Account #: [id number] Procedure:                Upper GI endoscopy Indications:              Dysphagia, Esophageal reflux symptoms that persist                            despite appropriate therapy Medicines:                Monitored Anesthesia Care Procedure:                Pre-Anesthesia Assessment:                           - Prior to the procedure, a History and Physical                            was performed, and patient medications and                            allergies were reviewed. The patient's tolerance of                            previous anesthesia was also reviewed. The risks                            and benefits of the procedure and the sedation                            options and risks were discussed with the patient.                            All questions were answered, and informed consent                            was obtained. Prior Anticoagulants: The patient                            last took Xarelto (rivaroxaban) 2 days prior to the                            procedure. ASA Grade Assessment: III - A patient                            with severe systemic disease. After reviewing the                            risks and benefits, the patient was deemed in                            satisfactory condition to undergo the procedure.  After obtaining informed consent, the endoscope was                            passed under direct vision. Throughout the                            procedure, the patient's blood pressure, pulse, and                            oxygen saturations were monitored continuously. The                            Endoscope was introduced through the mouth, and                            advanced to the  second part of duodenum. The upper                            GI endoscopy was accomplished without difficulty.                            The patient tolerated the procedure well. Scope In: Scope Out: Findings:                 One benign-appearing, intrinsic moderate                            (circumferential scarring or stenosis; an endoscope                            may pass) stenosis was found 34 to 35 cm from the                            incisors. This stenosis measured 1.4 cm (inner                            diameter) x less than one cm (in length). The                            stenosis was traversed. A TTS dilator was passed                            through the scope. Dilation with a 16-17-18 mm                            balloon dilator was performed to 18 mm. The                            dilation site was examined following endoscope                            reinsertion and showed mild mucosal disruption.  A medium-sized hiatal hernia was present.                           No gross lesions were noted in the entire examined                            stomach.                           The first portion of the duodenum and second                            portion of the duodenum were normal. Complications:            No immediate complications. Estimated Blood Loss:     Estimated blood loss was minimal. Impression:               - Benign-appearing esophageal stenosis. Dilated.                           - Medium-sized hiatal hernia.                           - No gross lesions in the stomach.                           - Normal first portion of the duodenum and second                            portion of the duodenum.                           - No specimens collected. Recommendation:           - Patient has a contact number available for                            emergencies. The signs and symptoms of potential                             delayed complications were discussed with the                            patient. Return to normal activities tomorrow.                            Written discharge instructions were provided to the                            patient.                           - Resume previous diet.                           - Continue present medications.                           -  Follow an antireflux regimen.                           - Use Protonix (pantoprazole) 40 mg PO daily X90                            days with 3 refills.                           - Return to GI office in 3 months. Please call to                            schedule appointment.                           - Resume Xarelto (rivaroxaban) at prior dose in 2                            days. Refer to managing physician for further                            adjustment of therapy. Mauri Pole, MD 06/25/2020 4:10:34 PM This report has been signed electronically.

## 2020-06-25 NOTE — Progress Notes (Signed)
M.O. vital signs.

## 2020-06-25 NOTE — Progress Notes (Signed)
Called to room to assist during endoscopic procedure.  Patient ID and intended procedure confirmed with present staff. Received instructions for my participation in the procedure from the performing physician.  

## 2020-06-25 NOTE — Telephone Encounter (Signed)
1.Medication Requested: losartan (COZAAR) 100 MG tablet    2. Pharmacy (Name, Street, [place]): [place], [place]  3. On Med List: no   4. Last Visit with PCP: 11.19.21  5. Next visit date with PCP: 5.20.22  Patient said that she is out of medication and does not want to take valsartan (DIOVAN) 160 MG tablet. Please advise     Agent: Please be advised that RX refills may take up to 3 business days. We ask that you follow-up with your pharmacy.

## 2020-06-25 NOTE — Patient Instructions (Signed)
YOU HAD AN ENDOSCOPIC PROCEDURE TODAY AT THE [facility]:   Refer to the procedure report that was given to you for any specific questions about what was found during the examination.  If the procedure report does not answer your questions, please call your gastroenterologist to clarify.  If you requested that your care partner not be given the details of your procedure findings, then the procedure report has been included in a sealed envelope for you to review at your convenience later.  YOU SHOULD EXPECT: Some feelings of bloating in the abdomen. Passage of more gas than usual.  Walking can help get rid of the air that was put into your GI tract during the procedure and reduce the bloating. If you had a lower endoscopy (such as a colonoscopy or flexible sigmoidoscopy) you may notice spotting of blood in your stool or on the toilet paper. If you underwent a bowel prep for your procedure, you may not have a normal bowel movement for a few days.  Please Note:  You might notice some irritation and congestion in your nose or some drainage.  This is from the oxygen used during your procedure.  There is no need for concern and it should clear up in a day or so.  SYMPTOMS TO REPORT IMMEDIATELY:   Following lower endoscopy (colonoscopy or flexible sigmoidoscopy):  Excessive amounts of blood in the stool  Significant tenderness or worsening of abdominal pains  Swelling of the abdomen that is new, acute  Fever of 100F or higher   Following upper endoscopy (EGD)  Vomiting of blood or coffee ground material  New chest pain or pain under the shoulder blades  Painful or persistently difficult swallowing  New shortness of breath  Fever of 100F or higher  Black, tarry-looking stools  For urgent or emergent issues, a gastroenterologist can be reached at any hour by calling [phone]. Do not use MyChart messaging for urgent concerns.    DIET:  We do recommend a small meal at first, but  then you may proceed to your regular diet.  Drink plenty of fluids but you should avoid alcoholic beverages for 24 hours.  ACTIVITY:  You should plan to take it easy for the rest of today and you should NOT DRIVE or use heavy machinery until tomorrow (because of the sedation medicines used during the test).    FOLLOW UP: Our staff will call the number listed on your records 48-72 hours following your procedure to check on you and address any questions or concerns that you may have regarding the information given to you following your procedure. If we do not reach you, we will leave a message.  We will attempt to reach you two times.  During this call, we will ask if you have developed any symptoms of COVID 19. If you develop any symptoms (ie: fever, flu-like symptoms, shortness of breath, cough etc.) before then, please call [phone].  If you test positive for Covid 19 in the 2 weeks post procedure, please call and report this information to us.    If any biopsies were taken you will be contacted by phone or by letter within the next 1-3 weeks.  Please call us at [phone] if you have not heard about the biopsies in 3 weeks.    SIGNATURES/CONFIDENTIALITY: You and/or your care partner have signed paperwork which will be entered into your electronic medical record.  These signatures attest to the fact that that the information above on   your After Visit Summary has been reviewed and is understood.  Full responsibility of the confidentiality of this discharge information lies with you and/or your care-partner. 

## 2020-06-25 NOTE — Op Note (Signed)
[place] Patient Name: Shannon Navarro Procedure Date: 06/25/2020 3:21 PM MRN: [id number] Endoscopist: Mauri Pole , MD Age: 65 Referring MD:  Date of Birth: 10-Nov-1955 Gender: Female Account #: [id number] Procedure:                Colonoscopy Indications:              Screening for colorectal malignant neoplasm Medicines:                Monitored Anesthesia Care Procedure:                Pre-Anesthesia Assessment:                           - Prior to the procedure, a History and Physical                            was performed, and patient medications and                            allergies were reviewed. The patient's tolerance of                            previous anesthesia was also reviewed. The risks                            and benefits of the procedure and the sedation                            options and risks were discussed with the patient.                            All questions were answered, and informed consent                            was obtained. Prior Anticoagulants: The patient                            last took Xarelto (rivaroxaban) 2 days prior to the                            procedure. ASA Grade Assessment: III - A patient                            with severe systemic disease. After reviewing the                            risks and benefits, the patient was deemed in                            satisfactory condition to undergo the procedure.                           After obtaining informed consent, the colonoscope  was passed under direct vision. Throughout the                            procedure, the patient's blood pressure, pulse, and                            oxygen saturations were monitored continuously. The                            Olympus PFC-[id number] (#[id number]) Colonoscope was                            introduced through the anus and advanced to the the                             cecum, identified by appendiceal orifice and                            ileocecal valve. The colonoscopy was performed                            without difficulty. The patient tolerated the                            procedure well. The quality of the bowel                            preparation was excellent. The ileocecal valve,                            appendiceal orifice, and rectum were photographed. Scope In: 3:32:52 PM Scope Out: 3:49:22 PM Scope Withdrawal Time: 0 hours 10 minutes 56 seconds  Total Procedure Duration: 0 hours 16 minutes 30 seconds  Findings:                 The perianal and digital rectal examinations were                            normal.                           A 5 mm polyp was found in the ascending colon. The                            polyp was sessile. The polyp was removed with a                            cold snare. Resection and retrieval were complete.                           Scattered small and large-mouthed diverticula were                            found in the sigmoid colon and descending colon.  Non-bleeding internal hemorrhoids were found during                            retroflexion. The hemorrhoids were medium-sized. Complications:            No immediate complications. Estimated Blood Loss:     Estimated blood loss was minimal. Impression:               - One 5 mm polyp in the ascending colon, removed                            with a cold snare. Resected and retrieved.                           - Diverticulosis in the sigmoid colon and in the                            descending colon.                           - Non-bleeding internal hemorrhoids. Recommendation:           - Patient has a contact number available for                            emergencies. The signs and symptoms of potential                            delayed complications were discussed with the                            patient. Return  to normal activities tomorrow.                            Written discharge instructions were provided to the                            patient.                           - Resume previous diet.                           - Continue present medications.                           - Await pathology results.                           - Repeat colonoscopy in 5-10 years for surveillance                            based on pathology results.                           - See the other procedure note for documentation of  additional recommendations. Mauri Pole, MD 06/25/2020 4:13:21 PM This report has been signed electronically.

## 2020-06-27 ENCOUNTER — Telehealth: Payer: Self-pay | Admitting: *Deleted

## 2020-06-27 NOTE — Telephone Encounter (Signed)
  Follow up Call-  Call back number 06/25/2020  Post procedure Call Back phone  # [phone]  Permission to leave phone message Yes  Some recent data might be hidden     Patient questions:  Do you have a fever, pain , or abdominal swelling? No. Pain Score  0 *  Have you tolerated food without any problems? Yes.    Have you been able to return to your normal activities? Yes.    Do you have any questions about your discharge instructions: Diet   No. Medications  No. Follow up visit  No.  Do you have questions or concerns about your Care? No.  Actions: * If pain score is 4 or above: No action needed, pain <4.  1. Have you developed a fever since your procedure? no  2.   Have you had an respiratory symptoms (SOB or cough) since your procedure? no  3.   Have you tested positive for COVID 19 since your procedure no  4.   Have you had any family members/close contacts diagnosed with the COVID 19 since your procedure?  no   If yes to any of these questions please route to Joylene John, RN and Joella Prince, RN

## 2020-07-07 ENCOUNTER — Encounter: Payer: Self-pay | Admitting: Gastroenterology

## 2020-07-10 ENCOUNTER — Ambulatory Visit (INDEPENDENT_AMBULATORY_CARE_PROVIDER_SITE_OTHER): Payer: BC Managed Care – PPO | Admitting: Internal Medicine

## 2020-07-10 ENCOUNTER — Other Ambulatory Visit: Payer: Self-pay

## 2020-07-10 ENCOUNTER — Encounter: Payer: Self-pay | Admitting: Internal Medicine

## 2020-07-10 VITALS — BP 128/76 | HR 56 | Temp 98.3°F | Resp 18 | Ht 66.0 in | Wt 265.4 lb

## 2020-07-10 DIAGNOSIS — R252 Cramp and spasm: Secondary | ICD-10-CM

## 2020-07-10 LAB — COMPREHENSIVE METABOLIC PANEL
ALT: 21 U/L (ref 0–35)
AST: 16 U/L (ref 0–37)
Albumin: 4.1 g/dL (ref 3.5–5.2)
Alkaline Phosphatase: 78 U/L (ref 39–117)
BUN: 15 mg/dL (ref 6–23)
CO2: 29 mEq/L (ref 19–32)
Calcium: 9.3 mg/dL (ref 8.4–10.5)
Chloride: 104 mEq/L (ref 96–112)
Creatinine, Ser: 0.74 mg/dL (ref 0.40–1.20)
GFR: 86.12 mL/min (ref >60.00)
Glucose, Bld: 110 mg/dL — ABNORMAL HIGH (ref 70–99)
Potassium: 3.8 mEq/L (ref 3.5–5.1)
Sodium: 140 mEq/L (ref 135–145)
Total Bilirubin: 1 mg/dL (ref 0.2–1.2)
Total Protein: 7.5 g/dL (ref 6.0–8.3)

## 2020-07-10 LAB — CBC
HCT: 43.2 % (ref 36.0–46.0)
Hemoglobin: 14.7 g/dL (ref 12.0–15.0)
MCHC: 34.1 g/dL (ref 30.0–36.0)
MCV: 84.5 fl (ref 78.0–100.0)
Platelets: 262 10*3/uL (ref 150.0–400.0)
RBC: 5.11 Mil/uL (ref 3.87–5.11)
RDW: 14.1 % (ref 11.5–15.5)
WBC: 6.3 10*3/uL (ref 4.0–10.5)

## 2020-07-10 LAB — FERRITIN: Ferritin: 54.2 ng/mL (ref 10.0–291.0)

## 2020-07-10 LAB — VITAMIN B12: Vitamin B-12: 304 pg/mL (ref 211–911)

## 2020-07-10 LAB — TSH: TSH: 1.32 u[IU]/mL (ref 0.35–4.50)

## 2020-07-10 LAB — MAGNESIUM: Magnesium: 1.9 mg/dL (ref 1.5–2.5)

## 2020-07-10 MED ORDER — CYCLOBENZAPRINE HCL 5 MG PO TABS: 5.0000 mg | ORAL_TABLET | Freq: Three times a day (TID) | ORAL | 1 refills | Status: DC | PRN

## 2020-07-10 NOTE — Progress Notes (Signed)
   Subjective:   Patient ID: Shannon Navarro, female    DOB: 1955-11-29, 65 y.o.   MRN: [id number]  HPI The patient is a 65 YO female coming in for concerns about muscle cramps and aching. More in the evening time but can happen anytime. She does get to feeling like they will cramp and has to stretch them out to avoid cramping. Denies change in diet or medications. She is working on being more active and losing weight.   Review of Systems  Constitutional: Negative.   HENT: Negative.   Eyes: Negative.   Respiratory: Negative for cough, chest tightness and shortness of breath.   Cardiovascular: Negative for chest pain, palpitations and leg swelling.  Gastrointestinal: Negative for abdominal distention, abdominal pain, constipation, diarrhea, nausea and vomiting.  Musculoskeletal: Positive for myalgias.  Skin: Negative.   Neurological: Negative.   Psychiatric/Behavioral: Negative.     Objective:  Physical Exam Constitutional:      Appearance: She is well-developed and well-nourished. She is obese.  HENT:     Head: Normocephalic and atraumatic.  Eyes:     Extraocular Movements: EOM normal.  Cardiovascular:     Rate and Rhythm: Normal rate and regular rhythm.  Pulmonary:     Effort: Pulmonary effort is normal. No respiratory distress.     Breath sounds: Normal breath sounds. No wheezing or rales.  Abdominal:     General: Bowel sounds are normal. There is no distension.     Palpations: Abdomen is soft.     Tenderness: There is no abdominal tenderness. There is no rebound.  Musculoskeletal:        General: No edema.     Cervical back: Normal range of motion.  Skin:    General: Skin is warm and dry.  Neurological:     Mental Status: She is alert and oriented to person, place, and time.     Coordination: Coordination normal.  Psychiatric:        Mood and Affect: Mood and affect normal.     Vitals:   07/10/20 1545  BP: 128/76  Pulse: (!) 56  Resp: 18  Temp: 98.3 F (36.8  C)  TempSrc: Oral  SpO2: 97%  Weight: 265 lb 6.4 oz (120.4 kg)  Height: 5\' 6"  (1.676 m)    This visit occurred during the SARS-CoV-2 public health emergency.  Safety protocols were in place, including screening questions prior to the visit, additional usage of staff PPE, and extensive cleaning of exam room while observing appropriate contact time as indicated for disinfecting solutions.   Assessment & Plan:

## 2020-07-10 NOTE — Patient Instructions (Signed)
We will check the labs today and have sent in a muscle relaxer flexeril to try this for the muscle cramps.

## 2020-07-11 LAB — VITAMIN D 25 HYDROXY (VIT D DEFICIENCY, FRACTURES): VITD: 41.51 ng/mL (ref 30.00–100.00)

## 2020-07-13 DIAGNOSIS — R252 Cramp and spasm: Secondary | ICD-10-CM | POA: Insufficient documentation

## 2020-07-13 NOTE — Assessment & Plan Note (Signed)
Rx flexeril and checking CBC, CMP, ferritin, B12, vitamin D, magnesium. Treat as appropriate.

## 2020-08-06 ENCOUNTER — Other Ambulatory Visit: Payer: Self-pay

## 2020-08-06 ENCOUNTER — Ambulatory Visit
Admission: RE | Admit: 2020-08-06 | Discharge: 2020-08-06 | Disposition: A | Discharge status: Home / Self Care | Payer: BC Managed Care – PPO | Source: Ambulatory Visit | Attending: Internal Medicine | Admitting: Internal Medicine

## 2020-08-06 DIAGNOSIS — Z1231 Encounter for screening mammogram for malignant neoplasm of breast: Secondary | ICD-10-CM

## 2020-09-01 NOTE — Progress Notes (Signed)
Cardiology Office Note   Date:  09/03/2020   ID:  PECOLIA MARANDO, DOB 1955-07-11, MRN [id number]  PCP:  [name], MD  Cardiologist:   Minus Breeding, MD   No chief complaint on file.     History of Present Illness: Shannon Navarro is a 65 y.o. female who I saw her previously for cardiomegaly on the chest x-ray.  She was also found on a chest x-ray to have aortic atherosclerosis.  She has had a history of bradycardia.   She had an outpatient exercise tolerance test was ordered and completed on 06/13/2015, there was no evidence of ischemia, she was able to achieve 85% maximal heart rate after 6 minutes, no ST segment deviation was noted. The patient presented to the emergency room on 02/21/2016 with palpitation that woke her up from sleep and found to have new onset of atrial fibrillation. Patient spontaneously converted on diltiazem bolus. Given her elevated CHA2DS2-Vasc score 2 (female and HTN), she was placed on Xarelto.   In 2021 she had Covid.  She had fibrillation in January that lasted for about 4 hours.  She presented to the emergency room and converted spontaneously.  I reviewed these records.  She had another episode a few days later that was more short-lived.  This was in February.  At that time she was very stressed working to finish up a lot of work that she needed to do before retiring.  Since retiring in early February she had no further symptoms.  She feels well.  She is doing a little bit of walking but doing a lot of housework.  The patient denies any new symptoms such as chest discomfort, neck or arm discomfort. There has been no new shortness of breath, PND or orthopnea. There have been no reported palpitations, presyncope or syncope.   Past Medical History:  Diagnosis Date  . Allergic rhinitis   . Anticoagulant long-term use    XARELTO  . ANXIETY   . Endometrial polyp   . Endometrial polyp   . Fatty liver 06/23/07  . GERD   . Hiatal hernia   .  History of esophageal stricture    mainly due to gerd  . History of exercise stress test 06/13/2015   PER DR Felesha Moncrieffe NOTE -- NO EVIDENCE ISCHEMIA  . HYPERTENSION   . Impaired fasting glucose   . Migraine    hx of migraines none recent  . OSA on CPAP followed by dr Halford Chessman   per study 02-09-2012  severe osa w/ AHI 33.8  . Paroxysmal atrial fibrillation [facility]) cardioloigst-  dr Jaanvi Fizer   dx 02-21-2016 at ED  . Pseudotumor cerebri 2007  . Vitamin D deficiency 07/2013    Past Surgical History:  Procedure Laterality Date  . BREAST BIOPSY Right 08/12/2019    FIBROADENOMA   . COLONOSCOPY    . colonscopy  2007   normal  . DILATATION & CURETTAGE/HYSTEROSCOPY WITH MYOSURE N/A 03/26/2017   Procedure: DILATATION & CURETTAGE/HYSTEROSCOPY WITH MYOSURE;  Surgeon: Paula Compton, MD;  Location: [place];  Service: Gynecology;  Laterality: N/A;  . DILATION AND CURETTAGE OF UTERUS  2010  . LAPAROSCOPIC HYSTERECTOMY Bilateral 06/18/2017   Procedure: HYSTERECTOMY TOTAL LAPAROSCOPIC;  Surgeon: Paula Compton, MD;  Location: [facility];  Service: Gynecology;  Laterality: Bilateral;  OUT PT IN BED, needs extra time MD request 2.5hrs in OR  . right knee arthroscopy  03/2013  . TRANSTHORACIC ECHOCARDIOGRAM  03/11/2016   ef  60-65%/  mild MR/ trivial PR and TR  . TUBAL LIGATION    . UPPER GASTROINTESTINAL ENDOSCOPY       Current Outpatient Medications  Medication Sig Dispense Refill  . clotrimazole-betamethasone (LOTRISONE) cream APPLY CREAM TO AFFECTED AREA(S) TWICE DAILY 90 g 0  . pantoprazole (PROTONIX) 40 MG tablet Take 1 tablet (40 mg total) by mouth daily. 90 tablet 3  . propranolol (INDERAL) 20 MG tablet Take 1 tablet (20 mg total) by mouth 3 (three) times daily as needed. (Patient taking differently: Take 20 mg by mouth as needed. As needed) 60 tablet 4  . venlafaxine XR (EFFEXOR-XR) 75 MG 24 hr capsule Take 1 capsule by mouth once daily 90 capsule 3  .  amLODipine (NORVASC) 5 MG tablet Take 1 tablet (5 mg total) by mouth daily. 90 tablet 3  . losartan (COZAAR) 100 MG tablet Take 1 tablet (100 mg total) by mouth daily. 90 tablet 3  . rivaroxaban (XARELTO) 20 MG TABS tablet Take 1 tablet (20 mg total) by mouth daily with supper. 90 tablet 3   No current facility-administered medications for this visit.    Allergies:   Cefaclor, Codeine, Hydrocodone, Levaquin [levofloxacin], Nexium [esomeprazole magnesium], Penicillins, and Sulfa antibiotics    ROS:  Please see the history of present illness.   Otherwise, review of systems are positive for none.   All other systems are reviewed and negative.    PHYSICAL EXAM: VS:  BP 120/80   Pulse 61   Ht 5\' 6"  (1.676 m)   Wt 272 lb 12.8 oz (123.7 kg)   BMI 44.03 kg/m  , BMI Body mass index is 44.03 kg/m. GENERAL:  Well appearing NECK:  No jugular venous distention, waveform within normal limits, carotid upstroke brisk and symmetric, positive bilateral bruits, no thyromegaly LUNGS:  Clear to auscultation bilaterally CHEST:  Unremarkable HEART:  PMI not displaced or sustained,S1 and S2 within normal limits, no S3, no S4, no clicks, no rubs, no murmurs ABD:  Flat, positive bowel sounds normal in frequency in pitch, no bruits, no rebound, no guarding, no midline pulsatile mass, no hepatomegaly, no splenomegaly EXT:  2 plus pulses throughout, no edema, no cyanosis no clubbing     EKG:  EKG is  ordered today. The ekg ordered today demonstrates normal sinus rhythm, rate 61, axis within normal limits, intervals within normal limits, no acute ST-T wave changes.   Recent Labs: 07/10/2020: ALT 21; BUN 15; Creatinine, Ser 0.74; Hemoglobin 14.7; Magnesium 1.9; Platelets 262.0; Potassium 3.8; Sodium 140; TSH 1.32    Lipid Panel    Component Value Date/Time   CHOL 154 03/30/2020 0844   TRIG 79.0 03/30/2020 0844   HDL 42.40 03/30/2020 0844   CHOLHDL 4 03/30/2020 0844   VLDL 15.8 03/30/2020 0844    LDLCALC 96 03/30/2020 0844      Wt Readings from Last 3 Encounters:  09/03/20 272 lb 12.8 oz (123.7 kg)  07/10/20 265 lb 6.4 oz (120.4 kg)  05/21/20 268 lb (121.6 kg)      Other studies Reviewed: Additional studies/ records that were reviewed today include: ED records Review of the above records demonstrates:  Please see elsewhere in the note.     ASSESSMENT AND PLAN:  PAF:Shannon Lynch Saundershas a CHA2DS2 - VASc score of 2.   She tolerates anticoagulation has had no symptomatic paroxysms since February since she had less stress.  No change in therapy.  HTN: The blood pressure is controlled.  No change in therapy.  BRADYCARDIA: She tolerates a slightly low heart rate that has come up since she said she has been more active.  OBSTRUCTIVE SLEEP APNEA: She uses CPAP.   AORTIC ATHEROSCLEROSIS: She had a negative stress test in 2017.  We will continue with aggressive risk reduction.   OBESITY:  We talked about the Mediterranean diet and exercise  BRUITS: I will check carotid Dopplers.   Current medicines are reviewed at length with the patient today.  The patient does not have concerns regarding medicines.  The following changes have been made:   None  Labs/ tests ordered today include:   Orders Placed This Encounter  Procedures  . EKG 12-Lead  . VAS US CAROTID     Disposition:   FU with me in 12 months.     Signed, Minus Breeding, MD  09/03/2020 12:50 PM    [facility]

## 2020-09-03 ENCOUNTER — Ambulatory Visit: Payer: Managed Care, Other (non HMO) | Admitting: Cardiology

## 2020-09-03 ENCOUNTER — Other Ambulatory Visit: Payer: Self-pay

## 2020-09-03 ENCOUNTER — Encounter: Payer: Self-pay | Admitting: Cardiology

## 2020-09-03 VITALS — BP 120/80 | HR 61 | Ht 66.0 in | Wt 272.8 lb

## 2020-09-03 DIAGNOSIS — R001 Bradycardia, unspecified: Secondary | ICD-10-CM | POA: Diagnosis not present

## 2020-09-03 DIAGNOSIS — I700 Atherosclerosis of aorta: Secondary | ICD-10-CM | POA: Diagnosis not present

## 2020-09-03 DIAGNOSIS — I10 Essential (primary) hypertension: Secondary | ICD-10-CM

## 2020-09-03 DIAGNOSIS — I480 Paroxysmal atrial fibrillation: Secondary | ICD-10-CM | POA: Diagnosis not present

## 2020-09-03 DIAGNOSIS — R0989 Other specified symptoms and signs involving the circulatory and respiratory systems: Secondary | ICD-10-CM

## 2020-09-03 MED ORDER — AMLODIPINE BESYLATE 5 MG PO TABS: 1.0000 | ORAL_TABLET | Freq: Every day | ORAL | 3 refills | Status: DC

## 2020-09-03 MED ORDER — LOSARTAN POTASSIUM 100 MG PO TABS: 1.0000 | ORAL_TABLET | Freq: Every day | ORAL | 3 refills | Status: DC

## 2020-09-03 MED ORDER — RIVAROXABAN 20 MG PO TABS: 20.0000 mg | ORAL_TABLET | Freq: Every day | ORAL | 3 refills | Status: DC

## 2020-09-03 NOTE — Patient Instructions (Signed)
Medication Instructions:  Your physician recommends that you continue on your current medications as directed. Please refer to the Current Medication list given to you today.  *If you need a refill on your cardiac medications before your next appointment, please call your pharmacy*  Lab Work: NONE ordered at this time of appointment   If you have labs (blood work) drawn today and your tests are completely normal, you will receive your results only by: Marland Kitchen MyChart Message (if you have MyChart) OR . A paper copy in the mail If you have any lab test that is abnormal or we need to change your treatment, we will call you to review the results.  Testing/Procedures: Your physician has requested that you have a carotid duplex. This test is an ultrasound of the carotid arteries in your neck. It looks at blood flow through these arteries that supply the brain with blood. Allow one hour for this exam. There are no restrictions or special instructions.   Please schedule for 1 month    Follow-Up: At [facility], you and your health needs are our priority.  As part of our continuing mission to provide you with exceptional heart care, we have created designated Provider Care Teams.  These Care Teams include your primary Cardiologist (physician) and Advanced Practice Providers (APPs -  Physician Assistants and Nurse Practitioners) who all work together to provide you with the care you need, when you need it.  Your next appointment:   12 month(s)  The format for your next appointment:   In Person  Provider:   You may see Minus Breeding, MD or one of the following Advanced Practice Providers on your designated Care Team:    Rosaria Ferries, PA-C  Jory Sims, DNP, ANP  Other Instructions

## 2020-09-14 ENCOUNTER — Encounter: Payer: Self-pay | Admitting: Gastroenterology

## 2020-09-14 ENCOUNTER — Ambulatory Visit: Payer: Managed Care, Other (non HMO) | Admitting: Gastroenterology

## 2020-09-14 ENCOUNTER — Telehealth: Payer: Self-pay | Admitting: *Deleted

## 2020-09-14 ENCOUNTER — Other Ambulatory Visit: Payer: Self-pay

## 2020-09-14 VITALS — BP 120/70 | HR 62 | Ht 66.0 in | Wt 270.0 lb

## 2020-09-14 DIAGNOSIS — K219 Gastro-esophageal reflux disease without esophagitis: Secondary | ICD-10-CM | POA: Diagnosis not present

## 2020-09-14 DIAGNOSIS — R1310 Dysphagia, unspecified: Secondary | ICD-10-CM

## 2020-09-14 MED ORDER — PANTOPRAZOLE SODIUM 40 MG PO TBEC: 40.0000 mg | DELAYED_RELEASE_TABLET | Freq: Two times a day (BID) | ORAL | 6 refills | Status: DC

## 2020-09-14 NOTE — Telephone Encounter (Signed)
Patient with diagnosis of A Fib on Xarelto for anticoagulation.    Procedure: EGD Date of procedure: 12/03/20   CHA2DS2-VASc Score = 3  This indicates a 3.2% annual risk of stroke. The patient's score is based upon: CHF History: No HTN History: Yes Diabetes History: No Stroke History: No Vascular Disease History: Yes Age Score: 0 Gender Score: 1   CrCl 104 mL/min using adjusted body weight Platelet count 262K  Per office protocol, patient can hold Xarelto for 2 days prior to procedure.

## 2020-09-14 NOTE — Patient Instructions (Signed)
You will be contacted by our office prior to your procedure for directions on holding your Xarelto.  If you do not hear from our office 1 week prior to your scheduled procedure, please call [phone] to discuss.   You have been scheduled for an endoscopy. Please follow written instructions given to you at your visit today. If you use inhalers (even only as needed), please bring them with you on the day of your procedure.   Due to recent changes in healthcare laws, you may see the results of your imaging and laboratory studies on MyChart before your provider has had a chance to review them.  We understand that in some cases there may be results that are confusing or concerning to you. Not all laboratory results come back in the same time frame and the provider may be waiting for multiple results in order to interpret others.  Please give [place] 48 hours in order for your provider to thoroughly review all the results before contacting the office for clarification of your results.   Take Pantoprazole 40 mg twice a day for two months then decrease to 40 mg daily for 3 months, then to 20 mg daily   Follow up in 4 months  Due to recent changes in healthcare laws, you may see the results of your imaging and laboratory studies on MyChart before your provider has had a chance to review them.  We understand that in some cases there may be results that are confusing or concerning to you. Not all laboratory results come back in the same time frame and the provider may be waiting for multiple results in order to interpret others.  Please give [place] 48 hours in order for your provider to thoroughly review all the results before contacting the office for clarification of your results.   I appreciate the  opportunity to care for you  Thank You   Harl Bowie , MD

## 2020-09-14 NOTE — Telephone Encounter (Signed)
[facility] Pre-operative Risk Assessment     Request for surgical clearance:     Endoscopy Procedure  What type of surgery is being performed?     EGD  When is this surgery scheduled?     7/25  What type of clearance is required ?   Pharmacy  Are there any medications that need to be held prior to surgery and how long? Xarelto  Practice name and name of physician performing surgery?      [facility]  What is your office phone and fax number?      Phone- [phone]  Fax870 [phone]  Anesthesia type (None, local, MAC, general) ?       MAC

## 2020-09-14 NOTE — Telephone Encounter (Signed)
    Shannon Navarro DOB:  11-14-1955  MRN:  [id number]   Primary Cardiologist: Minus Breeding, MD  Chart reviewed as part of pre-operative protocol coverage.   Per pharmacy recommendations, patient can hold xarelto 2 days prior to her upcoming EGD with plans to restart as soon as she is cleared to do so by her gastroenterologist.  I will route this recommendation to the requesting party via [place] fax function and remove from pre-op pool.  Please call with questions.  Abigail Butts, PA-C 09/14/2020, 2:51 PM

## 2020-09-14 NOTE — Progress Notes (Signed)
Shannon Navarro    [id number]    [birth year]  Primary Care Physician:Crawford, Real Cons, MD  Referring Physician: [name], MD [address] [place],  [place] [address]   Chief complaint:  Dysphagia, GERD  HPI: 65 year old very pleasant female here for follow-up for GERD and dysphagia  Her symptoms were improved after last EGD with dilation, her swallowing was much better but she feels she is having difficulty swallowing in the past few weeks, she is regurgitating more. She is not taking pantoprazole, and is worried about potential side effects.  She is using Gaviscon as needed   EGD June 25, 2020 One benign-appearing, intrinsic moderate (circumferential scarring or stenosis; an endoscope may pass) stenosis was found 34 to 35 cm from the incisors. This stenosis measured 1.4 cm (inner diameter) x less than one cm (in length). The stenosis was traversed. A TTS dilator was passed through the scope. Dilation with a 16-17-18 mm balloon dilator was performed to 18 mm. The dilation site was examined following endoscope reinsertion and showed mild mucosal disruption. - A medium-sized hiatal hernia was present. - No gross lesions were noted in the entire examined stomach. - The first portion of the duodenum and second portion of the duodenum were normal.  Colonoscopy June 25, 2020 - The perianal and digital rectal examinations were normal. - A 5 mm polyp was found in the ascending colon. The polyp was sessile. The polyp was removed with a cold snare. Resection and retrieval were complete. - Scattered small and large-mouthed diverticula were found in the sigmoid colon and descending colon. - Non-bleeding internal hemorrhoids were found during retroflexion. The hemorrhoids were medium-sized.  Outpatient Encounter Medications as of 09/14/2020  Medication Sig  . amLODipine (NORVASC) 5 MG tablet Take 1 tablet (5 mg total) by mouth daily.  .  clotrimazole-betamethasone (LOTRISONE) cream APPLY CREAM TO AFFECTED AREA(S) TWICE DAILY  . losartan (COZAAR) 100 MG tablet Take 1 tablet (100 mg total) by mouth daily.  . propranolol (INDERAL) 20 MG tablet Take 1 tablet (20 mg total) by mouth 3 (three) times daily as needed. (Patient taking differently: Take 20 mg by mouth as needed. As needed)  . rivaroxaban (XARELTO) 20 MG TABS tablet Take 1 tablet (20 mg total) by mouth daily with supper.  . venlafaxine XR (EFFEXOR-XR) 75 MG 24 hr capsule Take 1 capsule by mouth once daily  . [DISCONTINUED] pantoprazole (PROTONIX) 40 MG tablet Take 1 tablet (40 mg total) by mouth daily. (Patient not taking: Reported on 09/14/2020)   No facility-administered encounter medications on file as of 09/14/2020.    Allergies as of 09/14/2020 - Review Complete 09/14/2020  Allergen Reaction Noted  . Cefaclor Rash 09/27/2008  . Codeine Nausea And Vomiting 09/27/2008  . Hydrocodone Nausea And Vomiting   . Levaquin [levofloxacin] Nausea Only 09/27/2008  . Nexium [esomeprazole magnesium] Other (See Comments) 11/23/2012  . Penicillins Rash 09/27/2008  . Sulfa antibiotics Rash 03/20/2017    Past Medical History:  Diagnosis Date  . Allergic rhinitis   . Anticoagulant long-term use    XARELTO  . ANXIETY   . Endometrial polyp   . Endometrial polyp   . Fatty liver 06/23/07  . GERD   . Hiatal hernia   . History of esophageal stricture    mainly due to gerd  . History of exercise stress test 06/13/2015   PER DR HOCHREIN NOTE -- NO EVIDENCE ISCHEMIA  . HYPERTENSION   .  Impaired fasting glucose   . Migraine    hx of migraines none recent  . OSA on CPAP followed by dr Halford Chessman   per study 02-09-2012  severe osa w/ AHI 33.8  . Paroxysmal atrial fibrillation [facility]) cardioloigst-  dr hochrein   dx 02-21-2016 at ED  . Pseudotumor cerebri 2007  . Vitamin D deficiency 07/2013    Past Surgical History:  Procedure Laterality Date  . BREAST BIOPSY Right 08/12/2019     FIBROADENOMA   . COLONOSCOPY    . colonscopy  2007   normal  . DILATATION & CURETTAGE/HYSTEROSCOPY WITH MYOSURE N/A 03/26/2017   Procedure: DILATATION & CURETTAGE/HYSTEROSCOPY WITH MYOSURE;  Surgeon: Paula Compton, MD;  Location: [facility];  Service: Gynecology;  Laterality: N/A;  . DILATION AND CURETTAGE OF UTERUS  2010  . LAPAROSCOPIC HYSTERECTOMY Bilateral 06/18/2017   Procedure: HYSTERECTOMY TOTAL LAPAROSCOPIC;  Surgeon: Paula Compton, MD;  Location[facility];  Service: Gynecology;  Laterality: Bilateral;  OUT PT IN BED, needs extra time MD request 2.5hrs in OR  . right knee arthroscopy  03/2013  . TRANSTHORACIC ECHOCARDIOGRAM  03/11/2016   ef 60-65%/  mild MR/ trivial PR and TR  . TUBAL LIGATION    . UPPER GASTROINTESTINAL ENDOSCOPY      Family History  Problem Relation Age of Onset  . Breast cancer Mother   . Asthma Mother   . Hypertension Mother   . Prostate cancer Father   . Aortic stenosis Father   . Hypertension Father   . Colon polyps Father   . Heart disease Father   . Diabetes Father   . Breast cancer Maternal Aunt   . Breast cancer Maternal Aunt   . Colon cancer Neg Hx   . Esophageal cancer Neg Hx   . Stomach cancer Neg Hx   . Rectal cancer Neg Hx     Social History   Socioeconomic History  . Marital status: Married    Spouse name: Not on file  . Number of children: 2  . Years of education: Not on file  . Highest education level: Not on file  Occupational History  . Occupation: Retired from [employer]: [place]: from [place], currently works [place] in [place] Use  . Smoking status: Never Smoker  . Smokeless tobacco: Never Used  . Tobacco comment: Married lives with spouse + 2 daughters  Vaping Use  . Vaping Use: Never used  Substance and Sexual Activity  . Alcohol use: No  . Drug use: No  . Sexual activity: Not on file  Other Topics Concern  . Not on file   Social History Narrative   Occupation: Management, social services. Married, lives with husband. Has 2 daughter 15 - 31   Social Determinants of Sales executive: Not on [employer] Insecurity: Not on file  Transportation Needs: Not on file  Physical Activity: Not on file  Stress: Not on file  Social Connections: Not on file  Intimate Partner Violence: Not on file      Review of systems: All other review of systems negative except as mentioned in the HPI.   Physical Exam: Vitals:   09/14/20 1058  BP: 120/70  Pulse: 62  SpO2: 96%   Body mass index is 43.58 kg/m. Gen:      No acute distress Psych: normal mood and affect  Data Reviewed:  Reviewed labs, radiology imaging, old records and pertinent past GI work up   Assessment and Plan/Recommendations:  65 year old very pleasant female with history hiatal hernia, chronic GERD, esophageal stricture with recurrent dysphagia Scheduled for EGD with esophageal dilation  Discussed potential benefits and side effects with PPI use.  Given overall it is more beneficial for patient to stay on PPI compared to the risks associated with long-term use, advised her to restart pantoprazole Use pantoprazole 40 mg twice daily, 30 minutes before breakfast and dinner for 2 months, taper to 40 mg once daily for 3 months and here for symptoms continue to improve, will consider to further taper down to 20 mg daily Continue antireflux measures  We will request clearance from cardiology to hold Xarelto for 2 days prior to the procedure  The risks and benefits as well as alternatives of endoscopic procedure(s) have been discussed and reviewed. All questions answered. The patient agrees to proceed.   The patient was provided an opportunity to ask questions and all were answered. The patient agreed with the plan and demonstrated an understanding of the instructions.  Damaris Hippo , MD    CC: [name],  *

## 2020-09-17 NOTE — Telephone Encounter (Signed)
Tried to call patient ok to hold xarelto 2 days. Patients mailbox is full

## 2020-09-28 ENCOUNTER — Ambulatory Visit: Payer: BC Managed Care – PPO | Admitting: Internal Medicine

## 2020-10-03 ENCOUNTER — Other Ambulatory Visit: Payer: Self-pay

## 2020-10-03 ENCOUNTER — Ambulatory Visit (HOSPITAL_COMMUNITY)
Admission: RE | Admit: 2020-10-03 | Discharge: 2020-10-03 | Disposition: A | Discharge status: Home / Self Care | Payer: Managed Care, Other (non HMO) | Source: Ambulatory Visit | Attending: Cardiovascular Disease | Admitting: Cardiovascular Disease

## 2020-10-03 ENCOUNTER — Encounter: Payer: Self-pay | Admitting: *Deleted

## 2020-10-03 DIAGNOSIS — R0989 Other specified symptoms and signs involving the circulatory and respiratory systems: Secondary | ICD-10-CM

## 2020-10-14 IMAGING — DX DG CHEST 1V PORT
1 series · 1 of 1 positions shown · non-contrast
Comparison: Radiograph 3 days prior 06/09/2019

CLINICAL DATA: COVID infection. Shortness of breath.

EXAM:
PORTABLE CHEST 1 VIEW

[chest]
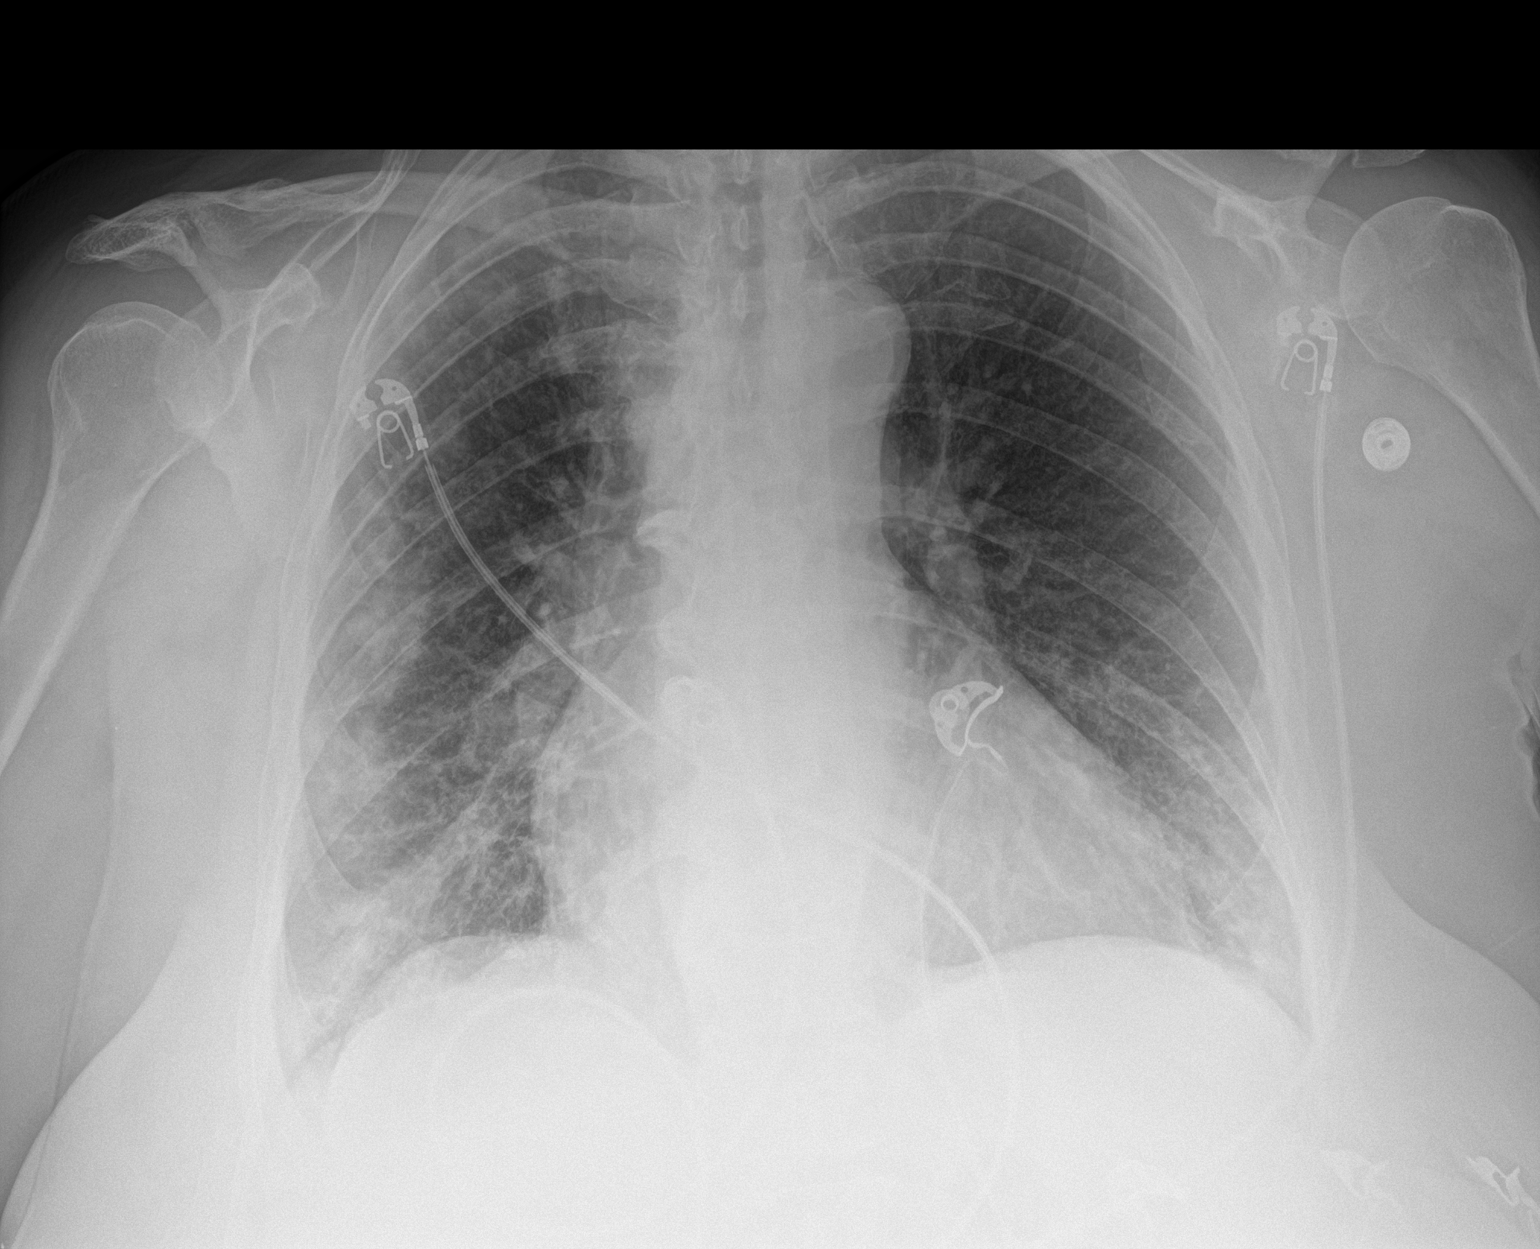

[1 of 1 positions shown; findings below may reference images not displayed]

FINDINGS: Mild cardiomegaly. Unchanged mediastinal contours. Heterogeneous
bilateral lung opacities in a peripheral predominant distribution,
right greater than left. Pulmonary opacities have slightly
progressed in the right upper lobe from prior. No pulmonary edema,
pleural effusion, or pneumothorax. No acute osseous abnormalities
are seen.
IMPRESSION: Heterogeneous bilateral lung opacities, right greater than left,
with slight progression from exam 3 days prior. Pattern consistent
with COVID pneumonia.

Mild cardiomegaly.

## 2020-11-09 HISTORY — PX: UPPER GASTROINTESTINAL ENDOSCOPY: SHX188

## 2020-11-15 ENCOUNTER — Ambulatory Visit: Payer: Managed Care, Other (non HMO) | Admitting: Internal Medicine

## 2020-11-23 IMAGING — MG DIGITAL SCREENING BILAT W/ TOMO W/ CAD
6 of 10 series · 6 of 30 positions shown · non-contrast
Comparison: Previous exam(s).

CLINICAL DATA: Screening.

EXAM:
DIGITAL SCREENING BILATERAL MAMMOGRAM WITH TOMO AND CAD

[L MLO synth-2D]
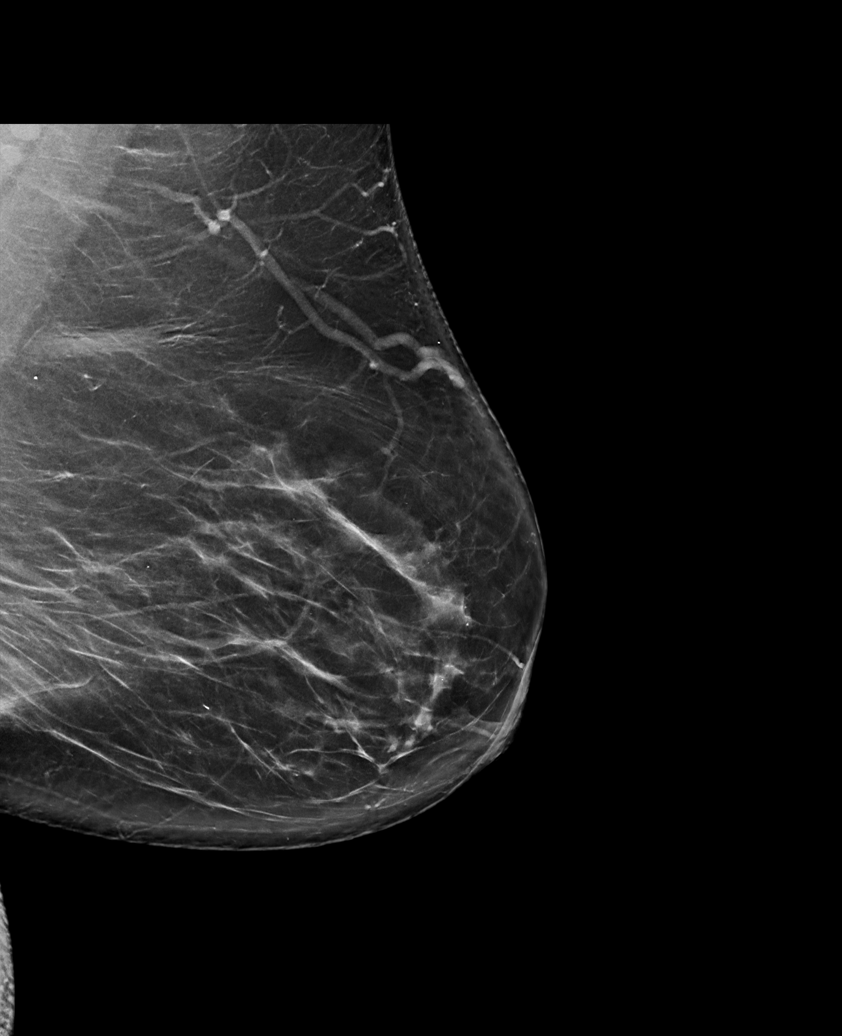

[R CC synth-2D]
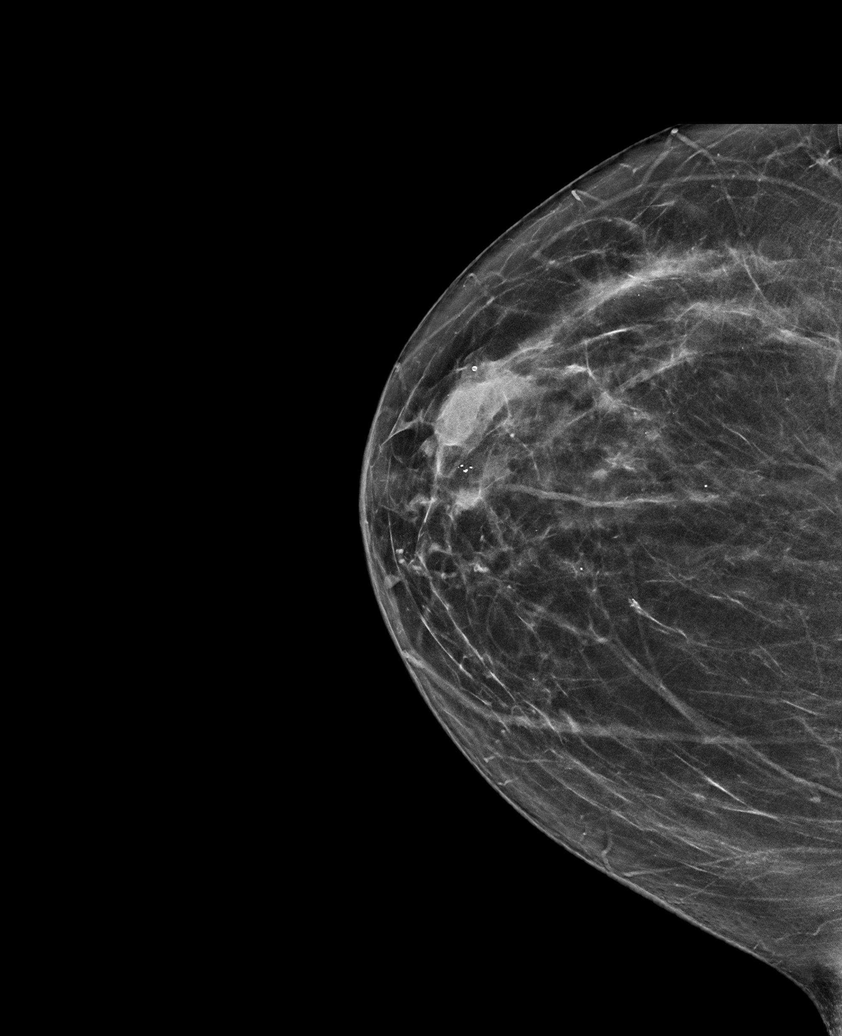

[R MLO synth-2D (1 of 2)]
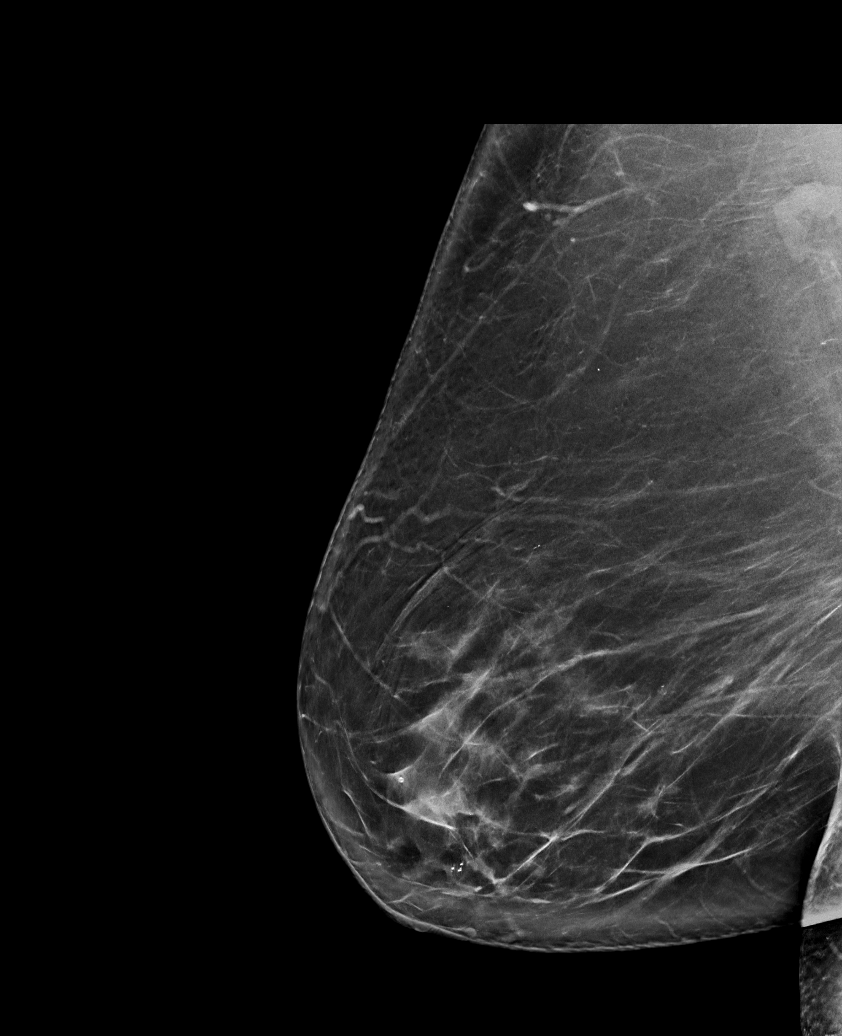

[L CC synth-2D]
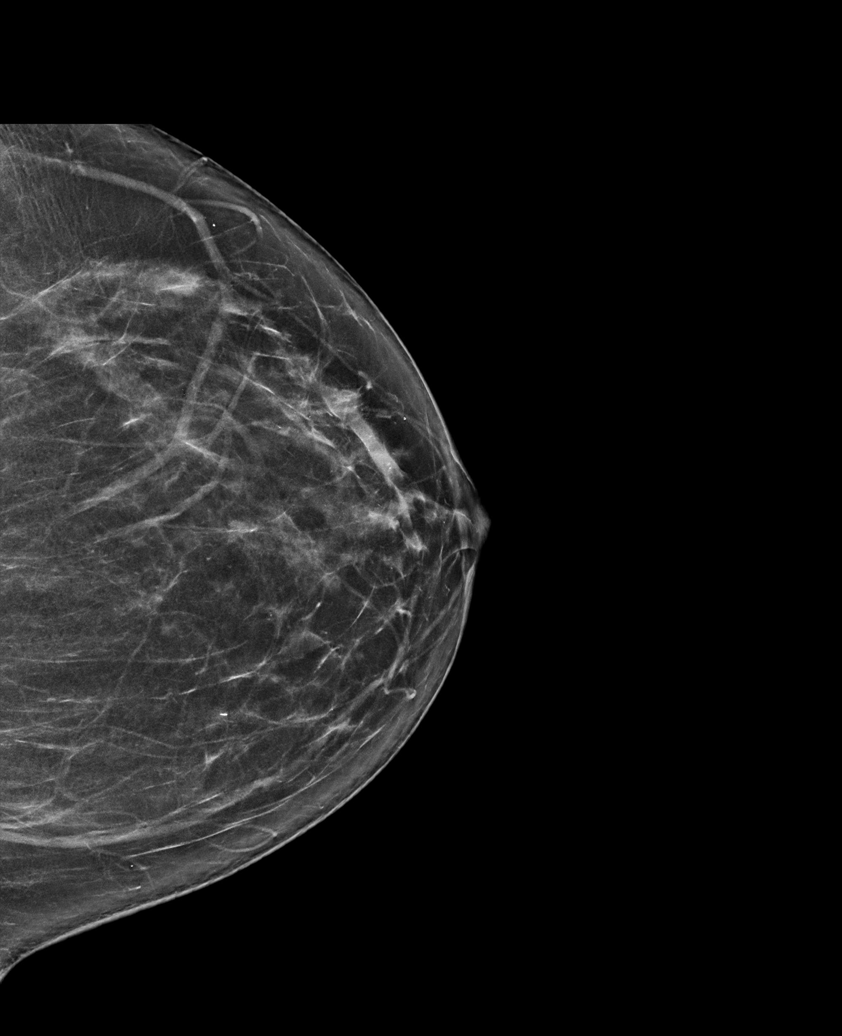

[R MLO synth-2D (2 of 2)]
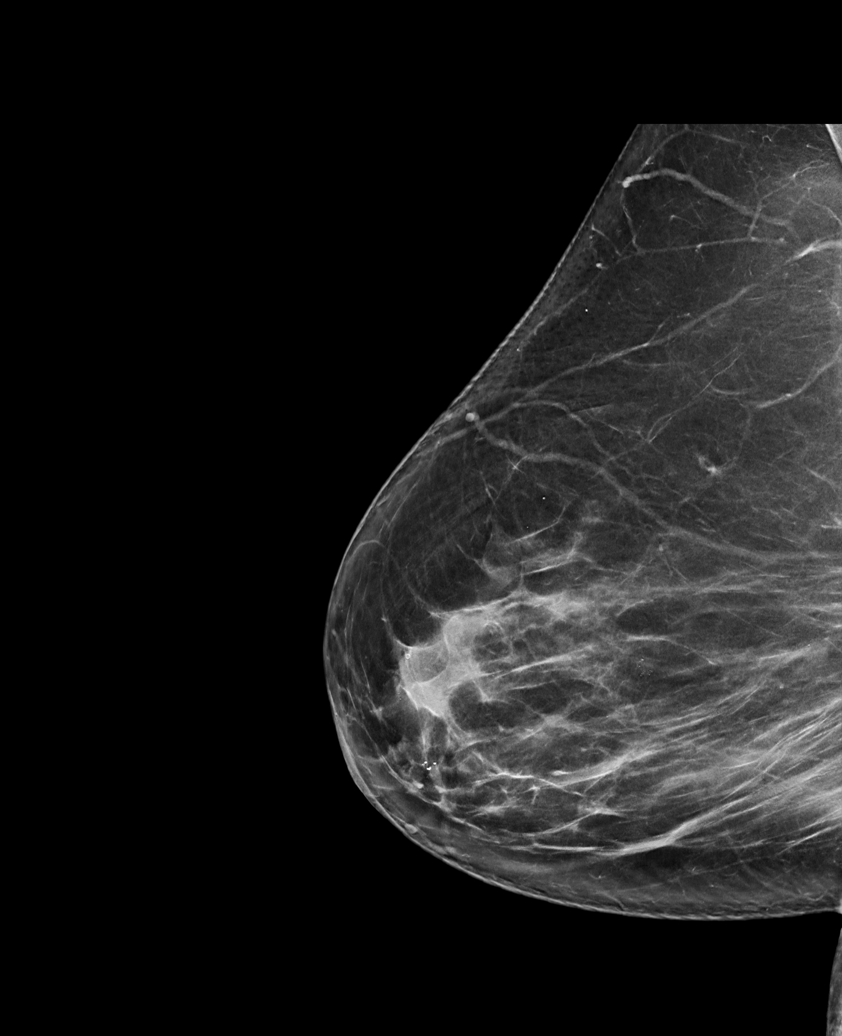

[R MLO tomo · tomo slice 47/93.0]
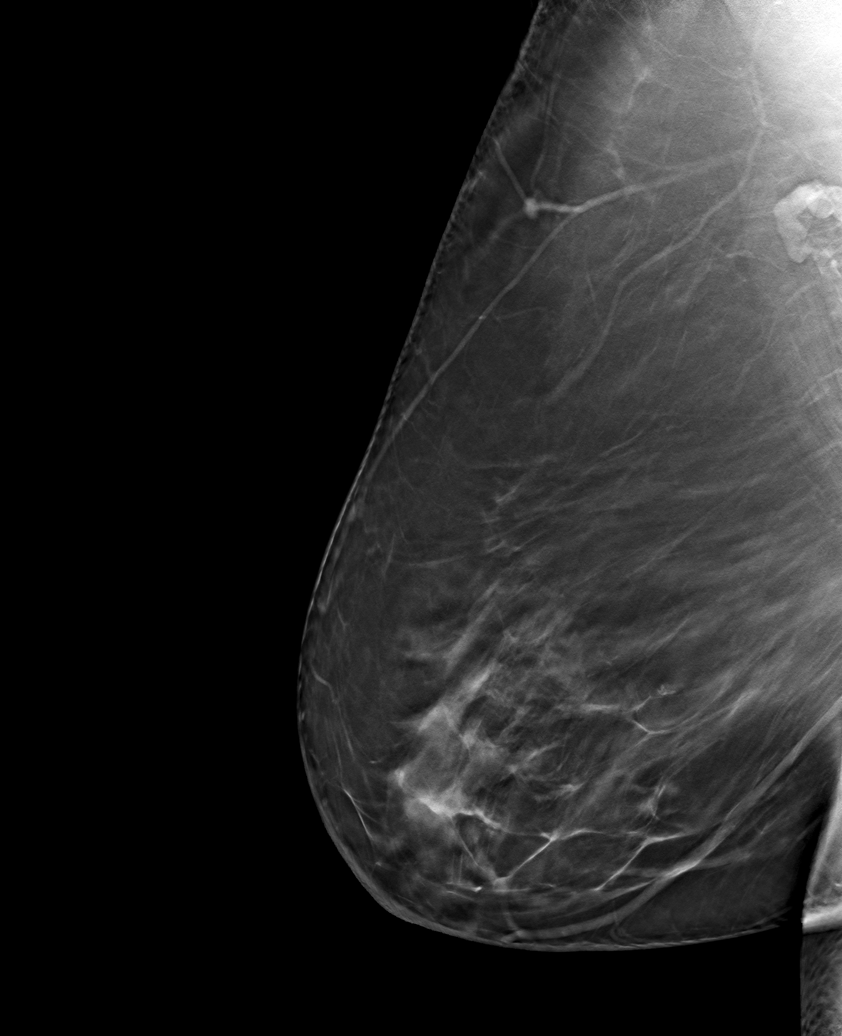

[6 of 30 positions shown; findings below may reference images not displayed]

ACR Breast Density Category b: There are scattered areas of
fibroglandular density.
FINDINGS: In the right breast, calcifications warrant further evaluation with
magnified views. In the left breast, no findings suspicious for
malignancy. Images were processed with CAD.
IMPRESSION: Further evaluation is suggested for calcifications in the right
breast.

RECOMMENDATION:
Diagnostic mammogram of the right breast. (Code:[id number]-[id number])

The patient will be contacted regarding the findings, and additional
imaging will be scheduled.

BI-RADS CATEGORY  0: Incomplete. Need additional imaging evaluation
and/or prior mammograms for comparison.

## 2020-12-03 ENCOUNTER — Ambulatory Visit (AMBULATORY_SURGERY_CENTER): Payer: Managed Care, Other (non HMO) | Admitting: Gastroenterology

## 2020-12-03 ENCOUNTER — Encounter: Payer: Self-pay | Admitting: Gastroenterology

## 2020-12-03 ENCOUNTER — Other Ambulatory Visit: Payer: Self-pay

## 2020-12-03 VITALS — BP 120/56 | HR 44 | Temp 98.7°F | Resp 16 | Ht 66.0 in | Wt 270.0 lb

## 2020-12-03 DIAGNOSIS — R1310 Dysphagia, unspecified: Secondary | ICD-10-CM

## 2020-12-03 DIAGNOSIS — K219 Gastro-esophageal reflux disease without esophagitis: Secondary | ICD-10-CM

## 2020-12-03 DIAGNOSIS — K222 Esophageal obstruction: Secondary | ICD-10-CM | POA: Diagnosis not present

## 2020-12-03 MED ORDER — PANTOPRAZOLE SODIUM 40 MG PO TBEC: 40.0000 mg | DELAYED_RELEASE_TABLET | Freq: Two times a day (BID) | ORAL | 3 refills | Status: DC

## 2020-12-03 MED ORDER — SODIUM CHLORIDE 0.9 % IV SOLN: 500.0000 mL | Freq: Once | INTRAVENOUS | Status: DC

## 2020-12-03 NOTE — Progress Notes (Signed)
Called to room to assist during endoscopic procedure.  Patient ID and intended procedure confirmed with present staff. Received instructions for my participation in the procedure from the performing physician.  

## 2020-12-03 NOTE — Progress Notes (Signed)
Vitals by CW 

## 2020-12-03 NOTE — Op Note (Signed)
[facility] Patient Name: Shannon Navarro Procedure Date: 12/03/2020 9:36 AM MRN: [id number] Endoscopist: Mauri Pole , MD Age: 65 Referring MD:  Date of Birth: 08-05-1955 Gender: Female Account #: [id number] Procedure:                Upper GI endoscopy Indications:              Dysphagia Medicines:                Monitored Anesthesia Care Procedure:                Pre-Anesthesia Assessment:                           - Prior to the procedure, a History and Physical                            was performed, and patient medications and                            allergies were reviewed. The patient's tolerance of                            previous anesthesia was also reviewed. The risks                            and benefits of the procedure and the sedation                            options and risks were discussed with the patient.                            All questions were answered, and informed consent                            was obtained. Prior Anticoagulants: The patient                            last took Xarelto (rivaroxaban) 2 days prior to the                            procedure. ASA Grade Assessment: III - A patient                            with severe systemic disease. After reviewing the                            risks and benefits, the patient was deemed in                            satisfactory condition to undergo the procedure.                           After obtaining informed consent, the endoscope was  passed under direct vision. Throughout the                            procedure, the patient's blood pressure, pulse, and                            oxygen saturations were monitored continuously. The                            Endoscope was introduced through the mouth, and                            advanced to the second part of duodenum. The upper                            GI endoscopy was accomplished  without difficulty.                            The patient tolerated the procedure well. Scope In: Scope Out: Findings:                 One benign-appearing, intrinsic moderate                            (circumferential scarring or stenosis; an endoscope                            may pass) stenosis was found 34 to 35 cm from the                            incisors. This stenosis measured 1.6 cm (inner                            diameter) x less than one cm (in length). The                            stenosis was traversed. The scope was withdrawn.                            Dilation was performed with a Maloney dilator with                            mild resistance at 48 Fr. The dilation site was                            examined following endoscope reinsertion and showed                            mild mucosal disruption and moderate improvement in                            luminal narrowing. Estimated blood loss was minimal.  The stomach was normal.                           The cardia and gastric fundus were normal on                            retroflexion.                           The examined duodenum was normal. Complications:            No immediate complications. Estimated Blood Loss:     Estimated blood loss was minimal. Impression:               - Benign-appearing esophageal stenosis. Dilated.                           - Normal stomach.                           - Normal examined duodenum.                           - No specimens collected. Recommendation:           - Patient has a contact number available for                            emergencies. The signs and symptoms of potential                            delayed complications were discussed with the                            patient. Return to normal activities tomorrow.                            Written discharge instructions were provided to the                            patient.                            - Resume previous diet.                           - Continue present medications.                           - Follow an antireflux regimen.                           - Use Protonix (pantoprazole) 40 mg PO BID.                           - Resume Xarelto (rivaroxaban) at prior dose  tomorrow. Refer to managing physician for further                            adjustment of therapy. Mauri Pole, MD 12/03/2020 10:18:59 AM This report has been signed electronically.

## 2020-12-03 NOTE — Patient Instructions (Addendum)
Handouts given for Post-Dilation diet,  stricture and GERD.  Resume Xarelto at prior dose TOMORROW 12/04/20.  SOFT DIET TODAY, RESUME REGULAR DIET AS TOLERATED TOMORROW.  YOU HAD AN ENDOSCOPIC PROCEDURE TODAY AT [facility]:   Refer to the procedure report that was given to you for any specific questions about what was found during the examination.  If the procedure report does not answer your questions, please call your gastroenterologist to clarify.  If you requested that your care partner not be given the details of your procedure findings, then the procedure report has been included in a sealed envelope for you to review at your convenience later.  YOU SHOULD EXPECT: Some feelings of bloating in the abdomen. Passage of more gas than usual.  Walking can help get rid of the air that was put into your GI tract during the procedure and reduce the bloating. If you had a lower endoscopy (such as a colonoscopy or flexible sigmoidoscopy) you may notice spotting of blood in your stool or on the toilet paper. If you underwent a bowel prep for your procedure, you may not have a normal bowel movement for a few days.  Please Note:  You might notice some irritation and congestion in your nose or some drainage.  This is from the oxygen used during your procedure.  There is no need for concern and it should clear up in a day or so.  SYMPTOMS TO REPORT IMMEDIATELY:  Following upper endoscopy (EGD)  Vomiting of blood or coffee ground material  New chest pain or pain under the shoulder blades  Painful or persistently difficult swallowing  New shortness of breath  Fever of 100F or higher  Black, tarry-looking stools  For urgent or emergent issues, a gastroenterologist can be reached at any hour by calling [phone]. Do not use MyChart messaging for urgent concerns.   DIET: SOFT DIET FOR THE REST OF TODAY DUE TO THE DILATION.   We do recommend a small meal at first, but then you may  proceed to your regular diet.  Drink plenty of fluids but you should avoid alcoholic beverages for 24 hours.  ACTIVITY:  You should plan to take it easy for the rest of today and you should NOT DRIVE or use heavy machinery until tomorrow (because of the sedation medicines used during the test).    FOLLOW UP: Our staff will call the number listed on your records 48-72 hours following your procedure to check on you and address any questions or concerns that you may have regarding the information given to you following your procedure. If we do not reach you, we will leave a message.  We will attempt to reach you two times.  During this call, we will ask if you have developed any symptoms of COVID 19. If you develop any symptoms (ie: fever, flu-like symptoms, shortness of breath, cough etc.) before then, please call [phone].  If you test positive for Covid 19 in the 2 weeks post procedure, please call and report this information to [place].    If any biopsies were taken you will be contacted by phone or by letter within the next 1-3 weeks.  Please call us at [phone] if you have not heard about the biopsies in 3 weeks.    SIGNATURES/CONFIDENTIALITY: You and/or your care partner have signed paperwork which will be entered into your electronic medical record.  These signatures attest to the fact that that the information above on your After  Visit Summary has been reviewed and is understood.  Full responsibility of the confidentiality of this discharge information lies with you and/or your care-partner.

## 2020-12-03 NOTE — Progress Notes (Signed)
PT taken to PACU. Monitors in place. VSS. Report given to RN. 

## 2020-12-05 ENCOUNTER — Ambulatory Visit: Payer: Managed Care, Other (non HMO) | Admitting: Internal Medicine

## 2020-12-05 ENCOUNTER — Telehealth: Payer: Self-pay | Admitting: *Deleted

## 2020-12-05 NOTE — Telephone Encounter (Signed)
  Follow up Call-  Call back number 12/03/2020 06/25/2020  Post procedure Call Back phone  # [phone]  Permission to leave phone message Yes Yes  Some recent data might be hidden     Patient questions:  Do you have a fever, pain , or abdominal swelling? No. Pain Score  0 *  Have you tolerated food without any problems? Yes.    Have you been able to return to your normal activities? Yes.    Do you have any questions about your discharge instructions: Diet   No. Medications  No. Follow up visit  No.  Do you have questions or concerns about your Care? No.  Actions: * If pain score is 4 or above: No action needed, pain <4.

## 2020-12-14 ENCOUNTER — Encounter: Payer: Self-pay | Admitting: Internal Medicine

## 2020-12-14 ENCOUNTER — Ambulatory Visit: Payer: Managed Care, Other (non HMO) | Admitting: Internal Medicine

## 2020-12-14 ENCOUNTER — Other Ambulatory Visit: Payer: Self-pay

## 2020-12-14 DIAGNOSIS — J0111 Acute recurrent frontal sinusitis: Secondary | ICD-10-CM

## 2020-12-14 DIAGNOSIS — F411 Generalized anxiety disorder: Secondary | ICD-10-CM

## 2020-12-14 MED ORDER — DOXYCYCLINE HYCLATE 100 MG PO TABS: 100.0000 mg | ORAL_TABLET | Freq: Two times a day (BID) | ORAL | 0 refills | Status: AC

## 2020-12-14 MED ORDER — VENLAFAXINE HCL ER 37.5 MG PO CP24: 37.5000 mg | ORAL_CAPSULE | Freq: Every day | ORAL | 3 refills | Status: DC

## 2020-12-14 NOTE — Patient Instructions (Addendum)
We have sent in the lower dose venlafaxine to take 2-3 weeks then take 1 pill every other day for 2 weeks then stop. If you do get any withdrawal symptoms these typically do not last more than a few days or a week.  We have sent in 10 days of doxycycline to help the sinuses.

## 2020-12-14 NOTE — Progress Notes (Signed)
   Subjective:   Patient ID: Shannon Navarro, female    DOB: February 18, 1956, 65 y.o.   MRN: [id number]  HPI The patient is a 65 YO female coming in for concerns about sinus issues for several weeks now. Overall worsening. Has history of sinus problems and infection in the past feels similar.   Review of Systems  Constitutional:  Positive for activity change. Negative for appetite change, chills, fatigue, fever and unexpected weight change.  HENT:  Positive for congestion, ear pain, postnasal drip, rhinorrhea and sinus pressure. Negative for ear discharge, sinus pain, sneezing, sore throat, tinnitus, trouble swallowing and voice change.   Eyes: Negative.   Respiratory:  Negative for cough, chest tightness, shortness of breath and wheezing.   Cardiovascular: Negative.   Gastrointestinal: Negative.   Musculoskeletal:  Positive for myalgias.  Neurological: Negative.    Objective:  Physical Exam Constitutional:      Appearance: She is well-developed.  HENT:     Head: Normocephalic and atraumatic.     Comments: Oropharynx with redness and clear drainage, nose with swollen turbinates, TMs normal bilaterally.  Neck:     Thyroid: No thyromegaly.  Cardiovascular:     Rate and Rhythm: Normal rate and regular rhythm.  Pulmonary:     Effort: Pulmonary effort is normal. No respiratory distress.     Breath sounds: Normal breath sounds. No wheezing or rales.  Abdominal:     Palpations: Abdomen is soft.  Musculoskeletal:        General: Tenderness present.     Cervical back: Normal range of motion.  Lymphadenopathy:     Cervical: No cervical adenopathy.  Skin:    General: Skin is warm and dry.  Neurological:     Mental Status: She is alert and oriented to person, place, and time.    Vitals:   12/14/20 1519  BP: 130/84  Pulse: (!) 52  Resp: 18  Temp: 97.8 F (36.6 C)  TempSrc: Oral  SpO2: 99%  Weight: 266 lb 9.6 oz (120.9 kg)  Height: '5\' 6"'$  (1.676 m)    This visit occurred during  the SARS-CoV-2 public health emergency.  Safety protocols were in place, including screening questions prior to the visit, additional usage of staff PPE, and extensive cleaning of exam room while observing appropriate contact time as indicated for disinfecting solutions.   Assessment & Plan:

## 2020-12-14 NOTE — Assessment & Plan Note (Signed)
Rx doxycycline and she will continue her allergy medication.

## 2020-12-14 NOTE — Assessment & Plan Note (Signed)
Wishes to try wean effexor to off. Rx 37.5 mg daily for 2-3 weeks then every other day for 1-2 weeks then stop. Talked about possible withdrawal symptoms.

## 2021-01-13 ENCOUNTER — Other Ambulatory Visit: Payer: Self-pay | Admitting: Cardiology

## 2021-02-18 ENCOUNTER — Telehealth (INDEPENDENT_AMBULATORY_CARE_PROVIDER_SITE_OTHER): Payer: Managed Care, Other (non HMO) | Admitting: Internal Medicine

## 2021-02-18 ENCOUNTER — Telehealth: Payer: Managed Care, Other (non HMO) | Admitting: Emergency Medicine

## 2021-02-18 DIAGNOSIS — J0111 Acute recurrent frontal sinusitis: Secondary | ICD-10-CM | POA: Diagnosis not present

## 2021-02-18 MED ORDER — DOXYCYCLINE HYCLATE 100 MG PO TABS: 100.0000 mg | ORAL_TABLET | Freq: Two times a day (BID) | ORAL | 0 refills | Status: DC

## 2021-02-18 MED ORDER — FLUTICASONE PROPIONATE 50 MCG/ACT NA SUSP: 2.0000 | Freq: Every day | NASAL | 6 refills | Status: DC

## 2021-02-18 NOTE — Progress Notes (Signed)
Virtual Visit via Video Note  I connected with Shannon Navarro on 02/18/21 at 10:20 AM EDT by a video enabled telemedicine application and verified that I am speaking with the correct person using two identifiers.  The patient and the provider were at separate locations throughout the entire encounter. Patient location: home, Provider location: work   I discussed the limitations of evaluation and management by telemedicine and the availability of in person appointments. The patient expressed understanding and agreed to proceed. The patient and the provider were the only parties present for the visit unless noted in HPI below.  History of Present Illness: The patient is a 65 y.o. female with visit for sinus infection.   Observations/Objective: Appearance: voice sounds hoarse, breathing appears normal, minimal coughing during visit, casual grooming, abdomen dose not appear distended  Assessment and Plan: See problem oriented charting  Follow Up Instructions: rx doxycycline and refill flonase  I discussed the assessment and treatment plan with the patient. The patient was provided an opportunity to ask questions and all were answered. The patient agreed with the plan and demonstrated an understanding of the instructions.   The patient was advised to call back or seek an in-person evaluation if the symptoms worsen or if the condition fails to improve as anticipated.  [name], MD

## 2021-02-20 ENCOUNTER — Encounter: Payer: Self-pay | Admitting: Internal Medicine

## 2021-02-20 NOTE — Assessment & Plan Note (Signed)
Recurrent issue and rx doxycycline and refilled flonase and encouraged to use daily.

## 2021-03-02 ENCOUNTER — Other Ambulatory Visit: Payer: Self-pay

## 2021-03-02 ENCOUNTER — Ambulatory Visit (INDEPENDENT_AMBULATORY_CARE_PROVIDER_SITE_OTHER): Payer: Managed Care, Other (non HMO)

## 2021-03-02 DIAGNOSIS — Z23 Encounter for immunization: Secondary | ICD-10-CM | POA: Diagnosis not present

## 2021-04-01 ENCOUNTER — Ambulatory Visit (INDEPENDENT_AMBULATORY_CARE_PROVIDER_SITE_OTHER): Payer: Managed Care, Other (non HMO) | Admitting: Internal Medicine

## 2021-04-01 ENCOUNTER — Other Ambulatory Visit: Payer: Self-pay

## 2021-04-01 ENCOUNTER — Encounter: Payer: Self-pay | Admitting: Internal Medicine

## 2021-04-01 VITALS — BP 128/88 | HR 46 | Resp 18 | Ht 66.0 in | Wt 264.0 lb

## 2021-04-01 DIAGNOSIS — K219 Gastro-esophageal reflux disease without esophagitis: Secondary | ICD-10-CM

## 2021-04-01 DIAGNOSIS — R7301 Impaired fasting glucose: Secondary | ICD-10-CM

## 2021-04-01 DIAGNOSIS — I10 Essential (primary) hypertension: Secondary | ICD-10-CM

## 2021-04-01 DIAGNOSIS — G4733 Obstructive sleep apnea (adult) (pediatric): Secondary | ICD-10-CM

## 2021-04-01 DIAGNOSIS — E559 Vitamin D deficiency, unspecified: Secondary | ICD-10-CM

## 2021-04-01 DIAGNOSIS — F411 Generalized anxiety disorder: Secondary | ICD-10-CM

## 2021-04-01 DIAGNOSIS — I480 Paroxysmal atrial fibrillation: Secondary | ICD-10-CM

## 2021-04-01 DIAGNOSIS — Z0000 Encounter for general adult medical examination without abnormal findings: Secondary | ICD-10-CM | POA: Diagnosis not present

## 2021-04-01 LAB — CBC
HCT: 41 % (ref 36.0–46.0)
Hemoglobin: 13.8 g/dL (ref 12.0–15.0)
MCHC: 33.6 g/dL (ref 30.0–36.0)
MCV: 84.6 fl (ref 78.0–100.0)
Platelets: 256 10*3/uL (ref 150.0–400.0)
RBC: 4.84 Mil/uL (ref 3.87–5.11)
RDW: 13.5 % (ref 11.5–15.5)
WBC: 7 10*3/uL (ref 4.0–10.5)

## 2021-04-01 LAB — HEMOGLOBIN A1C: Hgb A1c MFr Bld: 6.1 % (ref 4.6–6.5)

## 2021-04-01 MED ORDER — AMLODIPINE-OLMESARTAN 5-40 MG PO TABS: 1.0000 | ORAL_TABLET | Freq: Every day | ORAL | 3 refills | Status: DC

## 2021-04-01 NOTE — Progress Notes (Signed)
   Subjective:   Patient ID: Shannon Navarro, female    DOB: November 11, 1955, 65 y.o.   MRN: [id number]  HPI The patient is a 65 YO female coming in for physical.  PMH, Council, social history reviewed and updated  Review of Systems  Constitutional: Negative.   HENT:  Positive for congestion.   Eyes: Negative.   Respiratory:  Negative for cough, chest tightness and shortness of breath.   Cardiovascular:  Negative for chest pain, palpitations and leg swelling.  Gastrointestinal:  Negative for abdominal distention, abdominal pain, constipation, diarrhea, nausea and vomiting.  Musculoskeletal: Negative.   Skin: Negative.   Neurological: Negative.   Psychiatric/Behavioral: Negative.     Objective:  Physical Exam Constitutional:      Appearance: She is well-developed. She is obese.  HENT:     Head: Normocephalic and atraumatic.  Cardiovascular:     Rate and Rhythm: Regular rhythm. Bradycardia present.  Pulmonary:     Effort: Pulmonary effort is normal. No respiratory distress.     Breath sounds: Normal breath sounds. No wheezing or rales.  Abdominal:     General: Bowel sounds are normal. There is no distension.     Palpations: Abdomen is soft.     Tenderness: There is no abdominal tenderness. There is no rebound.  Musculoskeletal:     Cervical back: Normal range of motion.  Skin:    General: Skin is warm and dry.  Neurological:     Mental Status: She is alert and oriented to person, place, and time.     Coordination: Coordination normal.    Vitals:   04/01/21 1526  BP: 128/88  Pulse: (!) 46  Resp: 18  SpO2: 97%  Weight: 264 lb (119.7 kg)  Height: 5\' 6"  (1.676 m)    This visit occurred during the SARS-CoV-2 public health emergency.  Safety protocols were in place, including screening questions prior to the visit, additional usage of staff PPE, and extensive cleaning of exam room while observing appropriate contact time as indicated for disinfecting solutions.   Assessment &  Plan:

## 2021-04-02 LAB — LIPID PANEL
Cholesterol: 164 mg/dL (ref 0–200)
HDL: 43.7 mg/dL (ref >39.00)
LDL Cholesterol: 97 mg/dL (ref 0–99)
NonHDL: 120.02
Total CHOL/HDL Ratio: 4
Triglycerides: 114 mg/dL (ref 0.0–149.0)
VLDL: 22.8 mg/dL (ref 0.0–40.0)

## 2021-04-02 LAB — COMPREHENSIVE METABOLIC PANEL
ALT: 13 U/L (ref 0–35)
AST: 16 U/L (ref 0–37)
Albumin: 4.1 g/dL (ref 3.5–5.2)
Alkaline Phosphatase: 71 U/L (ref 39–117)
BUN: 16 mg/dL (ref 6–23)
CO2: 27 mEq/L (ref 19–32)
Calcium: 9.4 mg/dL (ref 8.4–10.5)
Chloride: 107 mEq/L (ref 96–112)
Creatinine, Ser: 0.81 mg/dL (ref 0.40–1.20)
GFR: 76.87 mL/min (ref >60.00)
Glucose, Bld: 105 mg/dL — ABNORMAL HIGH (ref 70–99)
Potassium: 4.2 mEq/L (ref 3.5–5.1)
Sodium: 142 mEq/L (ref 135–145)
Total Bilirubin: 0.6 mg/dL (ref 0.2–1.2)
Total Protein: 7.4 g/dL (ref 6.0–8.3)

## 2021-04-02 LAB — MAGNESIUM: Magnesium: 1.9 mg/dL (ref 1.5–2.5)

## 2021-04-02 LAB — VITAMIN D 25 HYDROXY (VIT D DEFICIENCY, FRACTURES): VITD: 36.9 ng/mL (ref 30.00–100.00)

## 2021-04-02 NOTE — Assessment & Plan Note (Signed)
Weight stable and she is working on losing weight. Encouraged diet and exercise changes.

## 2021-04-02 NOTE — Assessment & Plan Note (Signed)
She is weaning off effexor and is currently taking 37.5 mg every other day with plans to stop in the next few weeks.

## 2021-04-02 NOTE — Assessment & Plan Note (Signed)
Checking HgA1c today.  

## 2021-04-02 NOTE — Assessment & Plan Note (Addendum)
Taking protonix 40 mg BID and good control.

## 2021-04-02 NOTE — Assessment & Plan Note (Signed)
Checking vitamin D level today and taking otc intermittently. Adjust as needed.

## 2021-04-02 NOTE — Assessment & Plan Note (Signed)
Would like to simplify number of pills if possible. Will change to amlodipine/olmesartan 5/40 mg which is equivalent to her prior losartan 100 mg daily and amlodipine 5 mg daily. Continue propranolol 20 mg TID prn. Checking CMP and adjust as needed.

## 2021-04-02 NOTE — Assessment & Plan Note (Signed)
Flu shot up to date. Covid-19 booster counseled. Shingrix complete. Tetanus counseled declines. Colonoscopy up to date. Mammogram up to date, pap smear up to date and will age out before due. Counseled about sun safety and mole surveillance. Counseled about the dangers of distracted driving. Given 10 year screening recommendations.

## 2021-04-02 NOTE — Assessment & Plan Note (Signed)
Still using CPAP

## 2021-04-02 NOTE — Assessment & Plan Note (Signed)
No flares recently. Taking xarelto and propranolol prn. Sounds regular today.

## 2021-04-15 ENCOUNTER — Telehealth: Payer: Self-pay | Admitting: Internal Medicine

## 2021-04-15 ENCOUNTER — Telehealth: Payer: Self-pay | Admitting: Cardiology

## 2021-04-15 NOTE — Telephone Encounter (Signed)
Called patient, she would like to know if she should continue xarelto or not, she has not had any episodes of AFIB since February, she states she would just like to know if she can just take a baby aspirin from now on. I advised I would route to MD to review.

## 2021-04-15 NOTE — Telephone Encounter (Signed)
New Message:     Patient wants to know if she needs to continue taking Xarelto. She says she have not had an Afib episode since February 2022. She said that Dr [name] and her had discussed an article about Xarelto. She said as he also had the Carotid Ultrasound and that was also good.

## 2021-04-15 NOTE — Telephone Encounter (Signed)
Patient requesting a call back to discuss making an appt w/ [facility]

## 2021-04-22 NOTE — Telephone Encounter (Signed)
Called pt. LDVM at [phone] to discuss what she needs. Office number was provided.   If pt calls back please find out if she [facility] if so she may need a referral from Dr. Sharlet Salina.

## 2021-04-25 NOTE — Telephone Encounter (Signed)
Called patient, advised of message below. Patient verbalized understanding.  

## 2021-05-29 ENCOUNTER — Other Ambulatory Visit: Payer: Self-pay | Admitting: Internal Medicine

## 2021-06-11 ENCOUNTER — Encounter: Payer: Self-pay | Admitting: Internal Medicine

## 2021-06-11 ENCOUNTER — Ambulatory Visit: Payer: Managed Care, Other (non HMO) | Admitting: Internal Medicine

## 2021-06-11 ENCOUNTER — Ambulatory Visit (INDEPENDENT_AMBULATORY_CARE_PROVIDER_SITE_OTHER): Payer: Managed Care, Other (non HMO)

## 2021-06-11 ENCOUNTER — Other Ambulatory Visit: Payer: Self-pay

## 2021-06-11 VITALS — BP 130/82 | HR 45 | Temp 98.3°F | Ht 66.0 in | Wt 259.8 lb

## 2021-06-11 DIAGNOSIS — M25512 Pain in left shoulder: Secondary | ICD-10-CM

## 2021-06-11 DIAGNOSIS — R21 Rash and other nonspecific skin eruption: Secondary | ICD-10-CM | POA: Diagnosis not present

## 2021-06-11 DIAGNOSIS — G8929 Other chronic pain: Secondary | ICD-10-CM | POA: Insufficient documentation

## 2021-06-11 MED ORDER — NYSTATIN-TRIAMCINOLONE 100000-0.1 UNIT/GM-% EX OINT: 1.0000 "application " | TOPICAL_OINTMENT | Freq: Two times a day (BID) | CUTANEOUS | 3 refills | Status: DC

## 2021-06-11 MED ORDER — MUPIROCIN CALCIUM 2 % EX CREA: 1.0000 "application " | TOPICAL_CREAM | Freq: Two times a day (BID) | CUTANEOUS | 0 refills | Status: DC

## 2021-06-11 NOTE — Assessment & Plan Note (Signed)
Has prior lipoma but this feels more firm over left scapula. Could be lipoma. Checking x-ray and if no etiology will refer to sports medicine for [place] evaluation.

## 2021-06-11 NOTE — Progress Notes (Signed)
° °  Subjective:   Patient ID: Shannon Navarro, female    DOB: 1956/02/12, 66 y.o.   MRN: [id number]  Rash Pertinent negatives include no cough, diarrhea, shortness of breath or vomiting.  The patient is a 66 YO female coming in for rash and left shoulder pain.  Review of Systems  Constitutional: Negative.   HENT: Negative.    Eyes: Negative.   Respiratory:  Negative for cough, chest tightness and shortness of breath.   Cardiovascular:  Negative for chest pain, palpitations and leg swelling.  Gastrointestinal:  Negative for abdominal distention, abdominal pain, constipation, diarrhea, nausea and vomiting.  Musculoskeletal:  Positive for arthralgias.  Skin:  Positive for rash.  Neurological: Negative.   Psychiatric/Behavioral: Negative.     Objective:  Physical Exam Constitutional:      Appearance: She is well-developed. She is obese.  HENT:     Head: Normocephalic and atraumatic.  Cardiovascular:     Rate and Rhythm: Normal rate and regular rhythm.  Pulmonary:     Effort: Pulmonary effort is normal. No respiratory distress.     Breath sounds: Normal breath sounds. No wheezing or rales.  Abdominal:     General: Bowel sounds are normal. There is no distension.     Palpations: Abdomen is soft.     Tenderness: There is no abdominal tenderness. There is no rebound.  Musculoskeletal:        General: Tenderness present.     Cervical back: Normal range of motion.     Comments: Left scapula tenderness with underlying area of firmness possibly muscle or cyst  Skin:    General: Skin is warm and dry.     Findings: Rash present.  Neurological:     Mental Status: She is alert and oriented to person, place, and time.     Coordination: Coordination normal.    Vitals:   06/11/21 1455  BP: 130/82  Pulse: (!) 45  Temp: 98.3 F (36.8 C)  TempSrc: Oral  SpO2: 98%  Weight: 259 lb 12.8 oz (117.8 kg)  Height: 5\' 6"  (1.676 m)    This visit occurred during the SARS-CoV-2 public health  emergency.  Safety protocols were in place, including screening questions prior to the visit, additional usage of staff PPE, and extensive cleaning of exam room while observing appropriate contact time as indicated for disinfecting solutions.   Assessment & Plan:

## 2021-06-11 NOTE — Patient Instructions (Signed)
We have sent in bactroban to use daily on the area.   The other cream we have sent in is nystatin/triamcinolone ointment to use twice a day to help this calm down.

## 2021-06-11 NOTE — Assessment & Plan Note (Signed)
Rx bactroban to use daily and nystatin/triamcinolone to use BID until resolved.

## 2021-06-13 ENCOUNTER — Telehealth: Payer: Self-pay | Admitting: Internal Medicine

## 2021-06-13 NOTE — Telephone Encounter (Signed)
Patient calling in  Patient says she spoke w/ pharmacy & they advised that insurance is not wanting to cover rx mupirocin cream (BACTROBAN) 2 %  Advised to try to fill as ointment instead of cream or send in new rx of substitution  Pharmacy  [id number] [address], [place] Phone:  [phone]  Fax:  [phone]

## 2021-06-14 ENCOUNTER — Other Ambulatory Visit: Payer: Self-pay | Admitting: Internal Medicine

## 2021-06-14 MED ORDER — MUPIROCIN 2 % EX OINT: 1.0000 "application " | TOPICAL_OINTMENT | Freq: Two times a day (BID) | CUTANEOUS | 0 refills | Status: DC

## 2021-06-14 MED ORDER — BACTROBAN NASAL 2 % NA OINT: TOPICAL_OINTMENT | NASAL | 0 refills | Status: DC

## 2021-06-14 NOTE — Telephone Encounter (Signed)
Sent in ointment

## 2021-06-14 NOTE — Telephone Encounter (Signed)
See below

## 2021-06-27 ENCOUNTER — Other Ambulatory Visit: Payer: Self-pay | Admitting: Orthopedic Surgery

## 2021-06-27 DIAGNOSIS — M25512 Pain in left shoulder: Secondary | ICD-10-CM

## 2021-07-04 ENCOUNTER — Other Ambulatory Visit: Payer: Self-pay | Admitting: Internal Medicine

## 2021-07-04 DIAGNOSIS — Z1231 Encounter for screening mammogram for malignant neoplasm of breast: Secondary | ICD-10-CM

## 2021-07-08 ENCOUNTER — Telehealth: Payer: Self-pay | Admitting: Internal Medicine

## 2021-07-08 NOTE — Telephone Encounter (Signed)
PT visits today with a denial from their insurance company. The insurance will not cover the service because they deemed it as not medically necessary. PT would like it to be sent through again if possible!

## 2021-07-09 ENCOUNTER — Ambulatory Visit
Admission: RE | Admit: 2021-07-09 | Discharge: 2021-07-09 | Disposition: A | Discharge status: Home / Self Care | Payer: Managed Care, Other (non HMO) | Source: Ambulatory Visit | Attending: Orthopedic Surgery | Admitting: Orthopedic Surgery

## 2021-07-09 DIAGNOSIS — M25512 Pain in left shoulder: Secondary | ICD-10-CM

## 2021-07-09 MED ORDER — GADOBENATE DIMEGLUMINE 529 MG/ML IV SOLN
20.0000 mL | Freq: Once | INTRAVENOUS | Status: AC | PRN
  Administered 2021-07-09: 20 mL via INTRAVENOUS

## 2021-07-31 ENCOUNTER — Telehealth: Payer: Self-pay | Admitting: Cardiology

## 2021-07-31 NOTE — Telephone Encounter (Signed)
STAT if HR is under 50 or over 120 ?(normal HR is 60-100 beats per minute) ? ?What is your heart rate? 55 ? ?Do you have a log of your heart rate readings (document readings)?  ? Last night 37 ? Night before 39 ?  ?Do you have any other symptoms? Resting rate is 39, she states she can feel a irregular heartbeat.  When it gets low her heart rate is when she can feel it skip a beat.  This has been going on for about a month. When she is rest, yesterday it went down to 44.  She does have bradycardia.  ?

## 2021-07-31 NOTE — Telephone Encounter (Signed)
Spoke to patient she stated she has been having slow irregular heart beat for the past month.Stated she is worried her heart beat slows down at night to 37 and irregular.Stated at present B/P 137/64 pulse 49.She feels tired.No chest pain or sob.Appointment scheduled with Dr.Hochrein 3/30 at 3:30 pm.Message sent to [place] for review. ?

## 2021-07-31 NOTE — Telephone Encounter (Signed)
Spoke to patient Dr.Hochrein's advice given. 

## 2021-08-07 ENCOUNTER — Ambulatory Visit
Admission: RE | Admit: 2021-08-07 | Discharge: 2021-08-07 | Disposition: A | Discharge status: Home / Self Care | Payer: Managed Care, Other (non HMO) | Source: Ambulatory Visit | Attending: Internal Medicine | Admitting: Internal Medicine

## 2021-08-07 DIAGNOSIS — R002 Palpitations: Secondary | ICD-10-CM | POA: Insufficient documentation

## 2021-08-07 DIAGNOSIS — Z1231 Encounter for screening mammogram for malignant neoplasm of breast: Secondary | ICD-10-CM

## 2021-08-07 NOTE — Progress Notes (Deleted)
?  ?Cardiology Office Note ? ? ?Date:  08/07/2021  ? ?ID:  Shannon Navarro, DOB 07-22-1955, MRN [id number] ? ?PCP:  [name], MD  ?Cardiologist:   Minus Breeding, MD ? ? ?No chief complaint on file. ? ? ?  ?History of Present Illness: ?Shannon Navarro is a 66 y.o. female who I saw her previously for evaluation of an irregular heart beat.  I saw her previously for aortic atherosclerosis.   She has had a history of bradycardia.   She had an outpatient exercise tolerance test was ordered and completed on 06/13/2015, there was no evidence of ischemia, she was able to achieve 85% maximal heart rate after 6 minutes, no ST segment deviation was noted. The patient presented to the emergency room on 02/21/2016 with palpitation that woke her up from sleep and found to have new onset of atrial fibrillation. Patient spontaneously converted on diltiazem bolus. Given her elevated CHA2DS2-Vasc score 2 (female and HTN), she was placed on Xarelto.   In 2021 she had Covid.  She had fibrillation in January that lasted for about 4 hours.   Patient spontaneously converted on diltiazem bolus. Given her elevated CHA2DS2-Vasc score 2 (female and HTN), she was placed on Xarelto. ? ?She called recently with palpitations at a slow rate. *** ? ? ? ?*** cardiomegaly on the chest x-ray.  She was also found on a chest x-ray to have aortic atherosclerosis.  She has had a history of bradycardia.   She had an outpatient exercise tolerance test was ordered and completed on 06/13/2015, there was no evidence of ischemia, she was able to achieve 85% maximal heart rate after 6 minutes, no ST segment deviation was noted. The patient presented to the emergency room on 02/21/2016 with palpitation that woke her up from sleep and found to have new onset of atrial fibrillation. Patient spontaneously converted on diltiazem bolus. Given her elevated CHA2DS2-Vasc score 2 (female and HTN), she was placed on Xarelto.   In 2021 she had Covid.  She had  fibrillation in January that lasted for about 4 hours.  She presented to the emergency room and converted spontaneously.  I reviewed these records.  She had another episode a few days later that was more short-lived.  This was in February.  At that time she was very stressed working to finish up a lot of work that she needed to do before retiring.  Since retiring in early February she had no further symptoms.  She feels well.  She is doing a little bit of walking but doing a lot of housework.  The patient denies any new symptoms such as chest discomfort, neck or arm discomfort. There has been no new shortness of breath, PND or orthopnea. There have been no reported palpitations, presyncope or syncope. ? ? ?Past Medical History:  ?Diagnosis Date  ? Allergic rhinitis   ? Anticoagulant long-term use   ? XARELTO  ? ANXIETY   ? Endometrial polyp   ? Endometrial polyp   ? Fatty liver 06/23/2007  ? GERD   ? Hiatal hernia   ? History of esophageal stricture   ? mainly due to gerd  ? History of exercise stress test 06/13/2015  ? PER DR Margeret Stachnik NOTE -- NO EVIDENCE ISCHEMIA  ? HYPERTENSION   ? Impaired fasting glucose   ? Migraine   ? hx of migraines none recent  ? OSA on CPAP followed by dr Halford Chessman  ? per study 02-09-2012  severe osa w/  AHI 33.8  ? Paroxysmal atrial fibrillation [facility]) cardioloigst-  dr Aedyn Kempfer  ? dx 02-21-2016 at ED  ? Pseudotumor cerebri 2007  ? Sleep apnea   ? Vitamin D deficiency 07/2013  ? ? ?Past Surgical History:  ?Procedure Laterality Date  ? BREAST BIOPSY Right 08/12/2019  ?  FIBROADENOMA   ? COLONOSCOPY    ? colonscopy  2007  ? normal  ? DILATATION & CURETTAGE/HYSTEROSCOPY WITH MYOSURE N/A 03/26/2017  ? Procedure: DILATATION & CURETTAGE/HYSTEROSCOPY WITH MYOSURE;  Surgeon: Paula Compton, MD;  Location: [facility];  Service: Gynecology;  Laterality: N/A;  ? [place] OF UTERUS  2010  ? LAPAROSCOPIC HYSTERECTOMY Bilateral 06/18/2017  ? Procedure: HYSTERECTOMY TOTAL  LAPAROSCOPIC;  Surgeon: Paula Compton, MD;  Location: [facility];  Service: Gynecology;  Laterality: Bilateral;  OUT PT IN BED, needs extra time MD request 2.5hrs in OR  ? right knee arthroscopy  03/2013  ? TRANSTHORACIC ECHOCARDIOGRAM  03/11/2016  ? ef 60-65%/  mild MR/ trivial PR and TR  ? TUBAL LIGATION    ? UPPER GASTROINTESTINAL ENDOSCOPY  11/2020  ? ? ? ?Current Outpatient Medications  ?Medication Sig Dispense Refill  ? amLODipine-olmesartan (AZOR) 5-40 MG tablet Take 1 tablet by mouth daily. 90 tablet 3  ? fluticasone (FLONASE) 50 MCG/ACT nasal spray Place 2 sprays into both nostrils daily. 48 g 6  ? losartan (COZAAR) 100 MG tablet Take 100 mg by mouth daily.    ? mupirocin ointment (BACTROBAN) 2 % Apply 1 application topically 2 (two) times daily. 22 g 0  ? nystatin-triamcinolone ointment (MYCOLOG) Apply 1 application topically 2 (two) times daily. 100 g 3  ? pantoprazole (PROTONIX) 40 MG tablet Take 1 tablet (40 mg total) by mouth 2 (two) times daily before a meal. 90 tablet 3  ? propranolol (INDERAL) 20 MG tablet Take 1 tablet by mouth three times daily as needed 90 tablet 3  ? ?No current facility-administered medications for this visit.  ? ? ?Allergies:   Cefaclor, Codeine, Hydrocodone, Levaquin [levofloxacin], Nexium [esomeprazole magnesium], Penicillins, and Sulfa antibiotics  ? ? ?ROS:  Please see the history of present illness.   Otherwise, review of systems are positive for ***.   All other systems are reviewed and negative.  ? ? ?PHYSICAL EXAM: ?VS:  There were no vitals taken for this visit. , BMI There is no height or weight on file to calculate BMI. ?GENERAL:  Well appearing ?NECK:  No jugular venous distention, waveform within normal limits, carotid upstroke brisk and symmetric, no bruits, no thyromegaly ?LUNGS:  Clear to auscultation bilaterally ?CHEST:  Unremarkable ?HEART:  PMI not displaced or sustained,S1 and S2 within normal limits, no S3, no S4, no clicks, no rubs,  *** murmurs ?ABD:  Flat, positive bowel sounds normal in frequency in pitch, no bruits, no rebound, no guarding, no midline pulsatile mass, no hepatomegaly, no splenomegaly ?EXT:  2 plus pulses throughout, no edema, no cyanosis no clubbing ? ? ? ?***GENERAL:  Well appearing ?NECK:  No jugular venous distention, waveform within normal limits, carotid upstroke brisk and symmetric, positive bilateral bruits, no thyromegaly ?LUNGS:  Clear to auscultation bilaterally ?CHEST:  Unremarkable ?HEART:  PMI not displaced or sustained,S1 and S2 within normal limits, no S3, no S4, no clicks, no rubs, no murmurs ?ABD:  Flat, positive bowel sounds normal in frequency in pitch, no bruits, no rebound, no guarding, no midline pulsatile mass, no hepatomegaly, no splenomegaly ?EXT:  2 plus pulses throughout, no edema,  no cyanosis no clubbing ? ? ? ? ?EKG:  EKG is  *** ordered today. ?The ekg ordered today demonstrates normal sinus rhythm, rate ***, axis within normal limits, intervals within normal limits, no acute ST-T wave changes. ? ? ?Recent Labs: ?04/01/2021: ALT 13; BUN 16; Creatinine, Ser 0.81; Hemoglobin 13.8; Magnesium 1.9; Platelets 256.0; Potassium 4.2; Sodium 142  ? ? ?Lipid Panel ?   ?Component Value Date/Time  ? CHOL 164 04/01/2021 1555  ? TRIG 114.0 04/01/2021 1555  ? HDL 43.70 04/01/2021 1555  ? CHOLHDL 4 04/01/2021 1555  ? VLDL 22.8 04/01/2021 1555  ? [place] 97 04/01/2021 1555  ? ?  ? ?Wt Readings from Last 3 Encounters:  ?06/11/21 259 lb 12.8 oz (117.8 kg)  ?04/01/21 264 lb (119.7 kg)  ?12/14/20 266 lb 9.6 oz (120.9 kg)  ?  ? ? ?Other studies Reviewed: ?Additional studies/ records that were reviewed today include: *** ?Review of the above records demonstrates:  Please see elsewhere in the note.   ? ? ?ASSESSMENT AND PLAN: ? ?PAF:  [name] has a CHA2DS2 - VASc score of 2.    *** She tolerates anticoagulation has had no symptomatic paroxysms since February since she had less stress.  No change in  therapy. ? ?HTN:  The blood pressure is *** controlled.  No change in therapy. ?  ?BRADYCARDIA:    *** She tolerates a slightly low heart rate that has come up since she said she has been more active. ?  ?OBSTRUCTIVE

## 2021-08-08 ENCOUNTER — Ambulatory Visit: Payer: Managed Care, Other (non HMO) | Admitting: Cardiology

## 2021-08-08 DIAGNOSIS — I10 Essential (primary) hypertension: Secondary | ICD-10-CM

## 2021-08-08 DIAGNOSIS — R001 Bradycardia, unspecified: Secondary | ICD-10-CM

## 2021-08-08 DIAGNOSIS — R002 Palpitations: Secondary | ICD-10-CM

## 2021-08-08 DIAGNOSIS — I480 Paroxysmal atrial fibrillation: Secondary | ICD-10-CM

## 2021-08-08 DIAGNOSIS — G4733 Obstructive sleep apnea (adult) (pediatric): Secondary | ICD-10-CM

## 2021-08-13 ENCOUNTER — Other Ambulatory Visit: Payer: Self-pay | Admitting: Gastroenterology

## 2021-08-14 ENCOUNTER — Ambulatory Visit: Payer: Managed Care, Other (non HMO) | Admitting: Plastic Surgery

## 2021-08-14 ENCOUNTER — Encounter: Payer: Self-pay | Admitting: Plastic Surgery

## 2021-08-14 VITALS — BP 146/74 | HR 50 | Ht 66.0 in | Wt 257.8 lb

## 2021-08-14 DIAGNOSIS — G8929 Other chronic pain: Secondary | ICD-10-CM | POA: Diagnosis not present

## 2021-08-14 DIAGNOSIS — D171 Benign lipomatous neoplasm of skin and subcutaneous tissue of trunk: Secondary | ICD-10-CM

## 2021-08-14 DIAGNOSIS — I480 Paroxysmal atrial fibrillation: Secondary | ICD-10-CM

## 2021-08-14 DIAGNOSIS — M25512 Pain in left shoulder: Secondary | ICD-10-CM | POA: Diagnosis not present

## 2021-08-14 NOTE — Progress Notes (Signed)
? ?  Patient ID: Shannon Navarro, female    DOB: [birth year], 66 y.o.   MRN: [id number] ? ? ?Chief Complaint  ?Patient presents with  ? Advice Only  ? Skin Problem  ? ? ?The patient is a 66 year old female for evaluation of her back.  She has had a mass on her back for 3 years.  It has been confirmed as a lipoma according to the patient by MRI.  She was seen by [facility] orthopedist and due to the size she was referred to [place].  She is 5 feet 6 inches tall and weighs 257 pounds.  She is currently taking care of her 33 year old father so cannot afford any downtime.  The lesion is soft and has the appearance and consistency of a lipoma.  It is 16 x 35 cm.  It is located on the left side of her back.  Her past medical history is positive positive for atrial fibrillation, anxiety, reflux, esophageal stricture and migraines.  Her past surgical history includes a breast biopsy, a D&C and a tubal ligation. ? ? ?Review of Systems  ?Constitutional: Negative.  Negative for activity change and appetite change.  ?Eyes: Negative.   ?Respiratory: Negative.  Negative for chest tightness and shortness of breath.   ?Cardiovascular:  Negative for leg swelling.  ?Gastrointestinal: Negative.   ?Endocrine: Negative.   ?Genitourinary: Negative.   ?Musculoskeletal:  Positive for back pain.  ?Skin:  Negative for color change and wound.  ? ?Past Medical History:  ?Diagnosis Date  ? Allergic rhinitis   ? Anticoagulant long-term use   ? XARELTO  ? ANXIETY   ? Endometrial polyp   ? Endometrial polyp   ? Fatty liver 06/23/2007  ? GERD   ? Hiatal hernia   ? History of esophageal stricture   ? mainly due to gerd  ? History of exercise stress test 06/13/2015  ? PER DR HOCHREIN NOTE -- NO EVIDENCE ISCHEMIA  ? HYPERTENSION   ? Impaired fasting glucose   ? Migraine   ? hx of migraines none recent  ? OSA on CPAP followed by dr Halford Chessman  ? per study 02-09-2012  severe osa w/ AHI 33.8  ? Paroxysmal atrial fibrillation [facility]) cardioloigst-  dr hochrein  ? dx  02-21-2016 at ED  ? Pseudotumor cerebri 2007  ? Sleep apnea   ? Vitamin D deficiency 07/2013  ?  ?Past Surgical History:  ?Procedure Laterality Date  ? BREAST BIOPSY Right 08/12/2019  ?  FIBROADENOMA   ? COLONOSCOPY    ? colonscopy  2007  ? normal  ? DILATATION & CURETTAGE/HYSTEROSCOPY WITH MYOSURE N/A 03/26/2017  ? Procedure: DILATATION & CURETTAGE/HYSTEROSCOPY WITH MYOSURE;  Surgeon: Paula Compton, MD;  Location: [facility];  Service: Gynecology;  Laterality: N/A;  ? [place] OF UTERUS  2010  ? LAPAROSCOPIC HYSTERECTOMY Bilateral 06/18/2017  ? Procedure: HYSTERECTOMY TOTAL LAPAROSCOPIC;  Surgeon: Paula Compton, MD;  Location[facility];  Service: Gynecology;  Laterality: Bilateral;  OUT PT IN BED, needs extra time MD request 2.5hrs in OR  ? right knee arthroscopy  03/2013  ? TRANSTHORACIC ECHOCARDIOGRAM  03/11/2016  ? ef 60-65%/  mild MR/ trivial PR and TR  ? TUBAL LIGATION    ? UPPER GASTROINTESTINAL ENDOSCOPY  11/2020  ?  ? ? ?Current Outpatient Medications:  ?  amLODipine-olmesartan (AZOR) 5-40 MG tablet, Take 1 tablet by mouth daily., Disp: 90 tablet, Rfl: 3 ?  fluticasone (FLONASE) 50 MCG/ACT nasal spray, Place 2  sprays into both nostrils daily., Disp: 48 g, Rfl: 6 ?  losartan (COZAAR) 100 MG tablet, Take 100 mg by mouth daily., Disp: , Rfl:  ?  mupirocin ointment (BACTROBAN) 2 %, Apply 1 application topically 2 (two) times daily., Disp: 22 g, Rfl: 0 ?  nystatin-triamcinolone ointment (MYCOLOG), Apply 1 application topically 2 (two) times daily., Disp: 100 g, Rfl: 3 ?  pantoprazole (PROTONIX) 40 MG tablet, TAKE 1 TABLET BY MOUTH TWICE A DAY BEFORE A MEAL, Disp: 60 tablet, Rfl: 1 ?  propranolol (INDERAL) 20 MG tablet, Take 1 tablet by mouth three times daily as needed, Disp: 90 tablet, Rfl: 3  ? ?Objective:  ? ?Vitals:  ? 08/14/21 1527  ?BP: (!) 146/74  ?Pulse: (!) 50  ?SpO2: 98%  ? ? ?Physical Exam ?Vitals reviewed.  ?Constitutional:   ?   Appearance:  Normal appearance.  ?HENT:  ?   Head: Normocephalic and atraumatic.  ?Cardiovascular:  ?   Rate and Rhythm: Normal rate.  ?   Pulses: Normal pulses.  ?Pulmonary:  ?   Effort: Pulmonary effort is normal.  ?Abdominal:  ?   Palpations: Abdomen is soft.  ?Musculoskeletal:  ?     Arms: ? ?Skin: ?   Capillary Refill: Capillary refill takes less than 2 seconds.  ?   Coloration: Skin is not jaundiced.  ?   Findings: Lesion present. No bruising.  ?Neurological:  ?   Mental Status: She is alert and oriented to person, place, and time.  ?Psychiatric:     ?   Mood and Affect: Mood normal.     ?   Behavior: Behavior normal.     ?   Thought Content: Thought content normal.     ?   Judgment: Judgment normal.  ? ? ?Assessment & Plan:  ?Chronic left shoulder pain ? ?Lipoma of torso ? ?Recommend excision of left back lipoma.  I would like to use liposuction if possible.  We discussed the risks and complications including pain.  Patient would like to wait until things have stabilized with her dad prior to submitting to insurance. ? ?Pictures were obtained of the patient and placed in the chart with the patient's or guardian's permission. ? ? ?Loel Lofty Anneka Studer, DO ?

## 2021-08-27 ENCOUNTER — Other Ambulatory Visit: Payer: Self-pay | Admitting: Gastroenterology

## 2021-08-30 ENCOUNTER — Telehealth: Payer: Self-pay | Admitting: Cardiology

## 2021-08-30 NOTE — Telephone Encounter (Signed)
?*  STAT* If patient is at the pharmacy, call can be transferred to refill team. ? ? ?1. Which medications need to be refilled? (please list name of each medication and dose if known) losartan (COZAAR) 100 MG tablet ? ?2. Which pharmacy/location (including street and city if local pharmacy) is medication to be sent to? [id number] [address]? ?3. Do they need a 30 day or 90 day supply? 90 day ? ? ?Patient has an appointment 5/1 ?

## 2021-09-02 ENCOUNTER — Other Ambulatory Visit: Payer: Self-pay

## 2021-09-02 MED ORDER — LOSARTAN POTASSIUM 100 MG PO TABS: 100.0000 mg | ORAL_TABLET | Freq: Every day | ORAL | 3 refills | Status: DC

## 2021-09-02 NOTE — Telephone Encounter (Signed)
Called patient to advise refill request sent to pharmacy. Left detailed message. ?

## 2021-09-08 NOTE — Progress Notes (Signed)
?  ?Cardiology Office Note ? ? ?Date:  09/09/2021  ? ?ID:  Shannon Navarro, DOB [birth year], MRN [id number] ? ?PCP:  Shannon Koch, MD  ?Cardiologist:   Shannon Breeding, MD ? ? ?Chief Complaint  ?Patient presents with  ? Atrial Fibrillation  ? ? ?  ?History of Present Illness: ?Shannon Navarro is a 66 y.o. female who I saw her previously for cardiomegaly on the chest x-ray.  She was also found on a chest x-ray to have aortic atherosclerosis.  She has had a history of bradycardia.   She had an outpatient exercise tolerance test was ordered and completed on 06/13/2015, there was no evidence of ischemia, she was able to achieve 85% maximal heart rate after 6 minutes, no ST segment deviation was noted. The patient presented to the emergency room on 02/21/2016 with palpitation that woke her up from sleep and found to have new onset of atrial fibrillation. Patient spontaneously converted. Given her elevated CHA2DS2-Vasc score 2 (female and HTN), she was placed on Xarelto.    Eventually I took her off of this because she has had no recurrent fibrillation. ? ?Since I last saw her she has had no new cardiovascular complaints.  She is taking care of her dad who has his third cancer.  She does not feel any of the symptoms it was her previous atrial fibrillation.  She does feel occasional skipped beats that were happening at night but these seem to have stopped.  She would have some heart rates that were recorded as in the 30s.  She wears a Watch and it has not been alarming lately.  She feels okay.  She is not having any new shortness of breath, PND or orthopnea.  She is not having any chest pressure, neck or arm discomfort.  She has not been as active since she has been trying to take care of her dad. ? ? ?Past Medical History:  ?Diagnosis Date  ? Allergic rhinitis   ? Anticoagulant long-term use   ? XARELTO  ? ANXIETY   ? Endometrial polyp   ? Endometrial polyp   ? Fatty liver 06/23/2007  ? GERD   ? Hiatal hernia   ?  History of esophageal stricture   ? mainly due to gerd  ? History of exercise stress test 06/13/2015  ? PER DR Shannon Navarro NOTE -- NO EVIDENCE ISCHEMIA  ? HYPERTENSION   ? Impaired fasting glucose   ? Migraine   ? hx of migraines none recent  ? OSA on CPAP followed by dr Shannon Navarro  ? per study 02-09-2012  severe osa w/ AHI 33.8  ? Paroxysmal atrial fibrillation [facility]) cardioloigst-  dr Shannon Navarro  ? dx 02-21-2016 at ED  ? Pseudotumor cerebri 2007  ? Sleep apnea   ? Vitamin D deficiency 07/2013  ? ? ?Past Surgical History:  ?Procedure Laterality Date  ? BREAST BIOPSY Right 08/12/2019  ?  FIBROADENOMA   ? COLONOSCOPY    ? colonscopy  2007  ? normal  ? DILATATION & CURETTAGE/HYSTEROSCOPY WITH MYOSURE N/A 03/26/2017  ? Procedure: DILATATION & CURETTAGE/HYSTEROSCOPY WITH MYOSURE;  Surgeon: Shannon Compton, MD;  Location: [facility];  Service: Gynecology;  Laterality: N/A;  ? [place] OF UTERUS  2010  ? LAPAROSCOPIC HYSTERECTOMY Bilateral 06/18/2017  ? Procedure: HYSTERECTOMY TOTAL LAPAROSCOPIC;  Surgeon: Shannon Compton, MD;  Location: [facility];  Service: Gynecology;  Laterality: Bilateral;  OUT PT IN BED, needs extra time MD request 2.5hrs in  OR  ? right knee arthroscopy  03/2013  ? TRANSTHORACIC ECHOCARDIOGRAM  03/11/2016  ? ef 60-65%/  mild MR/ trivial PR and TR  ? TUBAL LIGATION    ? UPPER GASTROINTESTINAL ENDOSCOPY  11/2020  ? ? ? ?Current Outpatient Medications  ?Medication Sig Dispense Refill  ? amLODipine (NORVASC) 5 MG tablet Take 5 mg by mouth daily.    ? fluticasone (FLONASE) 50 MCG/ACT nasal spray Place 2 sprays into both nostrils daily. 48 g 6  ? losartan (COZAAR) 100 MG tablet Take 1 tablet (100 mg total) by mouth daily. 90 tablet 3  ? ZYRTEC ALLERGY 10 MG tablet as needed.    ? amLODipine-olmesartan (AZOR) 5-40 MG tablet Take 1 tablet by mouth daily. (Patient not taking: Reported on 09/09/2021) 90 tablet 3  ? mupirocin ointment (BACTROBAN) 2 % Apply 1 application  topically 2 (two) times daily. (Patient not taking: Reported on 09/09/2021) 22 g 0  ? nystatin-triamcinolone ointment (MYCOLOG) Apply 1 application topically 2 (two) times daily. (Patient not taking: Reported on 09/09/2021) 100 g 3  ? pantoprazole (PROTONIX) 40 MG tablet TAKE 1 TABLET BY MOUTH TWICE A DAY BEFORE A MEAL (Patient not taking: Reported on 09/09/2021) 180 tablet 1  ? propranolol (INDERAL) 20 MG tablet Take 1 tablet by mouth three times daily as needed (Patient not taking: Reported on 09/09/2021) 90 tablet 3  ? ?No current facility-administered medications for this visit.  ? ? ?Allergies:   Cefaclor, Codeine, Hydrocodone, Levaquin [levofloxacin], Nexium [esomeprazole magnesium], Penicillins, and Sulfa antibiotics  ? ? ?ROS:  Please see the history of present illness.   Otherwise, review of systems are positive for none.   All other systems are reviewed and negative.  ? ? ?PHYSICAL EXAM: ?VS:  BP (!) 146/80   Pulse (!) 52   Ht '5\' 6"'$  (1.676 m)   Wt 258 lb 9.6 oz (117.3 kg)   SpO2 97%   BMI 41.74 kg/m?  , BMI Body mass index is 41.74 kg/m?. ?GENERAL:  Well appearing ?NECK:  No jugular venous distention, waveform within normal limits, carotid upstroke brisk and symmetric, no bruits, no thyromegaly ?LUNGS:  Clear to auscultation bilaterally ?CHEST:  Unremarkable ?HEART:  PMI not displaced or sustained,S1 and S2 within normal limits, no S3, no S4, no clicks, no rubs, no murmurs ?ABD:  Flat, positive bowel sounds normal in frequency in pitch, no bruits, no rebound, no guarding, no midline pulsatile mass, no hepatomegaly, no splenomegaly ?EXT:  2 plus pulses throughout, no edema, no cyanosis no clubbing ? ? ?EKG:  EKG is  ordered today. ?The ekg ordered today demonstrates normal sinus rhythm, rate 52, axis within normal limits, intervals within normal limits, no acute ST-T wave changes. ? ? ?Recent Labs: ?04/01/2021: ALT 13; BUN 16; Creatinine, Ser 0.81; Hemoglobin 13.8; Magnesium 1.9; Platelets 256.0; Potassium  4.2; Sodium 142  ? ? ?Lipid Panel ?   ?Component Value Date/Time  ? CHOL 164 04/01/2021 1555  ? TRIG 114.0 04/01/2021 1555  ? HDL 43.70 04/01/2021 1555  ? CHOLHDL 4 04/01/2021 1555  ? VLDL 22.8 04/01/2021 1555  ? [place] 97 04/01/2021 1555  ? ?  ? ?Wt Readings from Last 3 Encounters:  ?09/09/21 258 lb 9.6 oz (117.3 kg)  ?08/14/21 257 lb 12.8 oz (116.9 kg)  ?06/11/21 259 lb 12.8 oz (117.8 kg)  ?  ? ? ?Other studies Reviewed: ?Additional studies/ records that were reviewed today include:Labs ?Review of the above records demonstrates:  Please see elsewhere in the note.   ? ? ?  ASSESSMENT AND PLAN: ? ?PAF:  Shannon Navarro has a CHA2DS2 - VASc score of 2.  She has had no symptomatic paroxysms.  She has a watch that would alert her.  This was an isolated episode.  She remains off anticoagulation. ? ?HTN:  The blood pressure is controlled on the meds as listed.  No change in therapy.  ? ?BRADYCARDIA:    She has no syncope or presyncope.  No change in therapy.  ?  ?OBSTRUCTIVE SLEEP APNEA: She was using CPAP.  ? ?AORTIC ATHEROSCLEROSIS: She had a negative stress test in 2017.  She will continue with primary risk reduction.  ? ?Current medicines are reviewed at length with the patient today.  The patient does not have concerns regarding medicines. ? ?The following changes have been made:   None ? ?Labs/ tests ordered today include: None ? ?Orders Placed This Encounter  ?Procedures  ? EKG 12-Lead  ? ? ? ?Disposition:   FU with me as needed. ? ?Signed, ?Shannon Breeding, MD  ?09/09/2021 1:08 PM    ?[place] ? ? ?

## 2021-09-09 ENCOUNTER — Ambulatory Visit: Payer: Managed Care, Other (non HMO) | Admitting: Cardiology

## 2021-09-09 ENCOUNTER — Encounter: Payer: Self-pay | Admitting: Cardiology

## 2021-09-09 VITALS — BP 146/80 | HR 52 | Ht 66.0 in | Wt 258.6 lb

## 2021-09-09 DIAGNOSIS — I10 Essential (primary) hypertension: Secondary | ICD-10-CM | POA: Diagnosis not present

## 2021-09-09 DIAGNOSIS — R001 Bradycardia, unspecified: Secondary | ICD-10-CM

## 2021-09-09 DIAGNOSIS — I480 Paroxysmal atrial fibrillation: Secondary | ICD-10-CM | POA: Diagnosis not present

## 2021-09-09 DIAGNOSIS — I700 Atherosclerosis of aorta: Secondary | ICD-10-CM

## 2021-09-09 NOTE — Patient Instructions (Signed)
  Follow-Up: At [facility], you and your health needs are our priority.  As part of our continuing mission to provide you with exceptional heart care, we have created designated Provider Care Teams.  These Care Teams include your primary Cardiologist (physician) and Advanced Practice Providers (APPs -  Physician Assistants and Nurse Practitioners) who all work together to provide you with the care you need, when you need it.  We recommend signing up for the patient portal called "MyChart".  Sign up information is provided on this After Visit Summary.  MyChart is used to connect with patients for Virtual Visits (Telemedicine).  Patients are able to view lab/test results, encounter notes, upcoming appointments, etc.  Non-urgent messages can be sent to your provider as well.   To learn more about what you can do with MyChart, go to [email or web address].    Your next appointment:    As needed  Important Information About Sugar       

## 2021-09-26 ENCOUNTER — Other Ambulatory Visit: Payer: Self-pay | Admitting: Orthopedic Surgery

## 2021-09-26 DIAGNOSIS — M4722 Other spondylosis with radiculopathy, cervical region: Secondary | ICD-10-CM

## 2021-09-30 ENCOUNTER — Ambulatory Visit
Admission: RE | Admit: 2021-09-30 | Discharge: 2021-09-30 | Disposition: A | Discharge status: Home / Self Care | Payer: Managed Care, Other (non HMO) | Source: Ambulatory Visit | Attending: Orthopedic Surgery | Admitting: Orthopedic Surgery

## 2021-09-30 DIAGNOSIS — M4722 Other spondylosis with radiculopathy, cervical region: Secondary | ICD-10-CM

## 2021-10-02 ENCOUNTER — Other Ambulatory Visit: Payer: Self-pay

## 2021-10-02 MED ORDER — AMLODIPINE BESYLATE 5 MG PO TABS
5.0000 mg | ORAL_TABLET | Freq: Every day | ORAL | 11 refills | Status: DC
Start: 2021-10-02 — End: 2022-03-13

## 2021-10-08 ENCOUNTER — Encounter: Payer: Self-pay | Admitting: Internal Medicine

## 2021-10-08 ENCOUNTER — Ambulatory Visit (INDEPENDENT_AMBULATORY_CARE_PROVIDER_SITE_OTHER): Payer: Managed Care, Other (non HMO)

## 2021-10-08 ENCOUNTER — Ambulatory Visit (INDEPENDENT_AMBULATORY_CARE_PROVIDER_SITE_OTHER): Payer: Managed Care, Other (non HMO) | Admitting: Internal Medicine

## 2021-10-08 VITALS — BP 172/74 | HR 48 | Temp 98.1°F | Ht 66.0 in | Wt 249.0 lb

## 2021-10-08 DIAGNOSIS — I10 Essential (primary) hypertension: Secondary | ICD-10-CM | POA: Diagnosis not present

## 2021-10-08 DIAGNOSIS — R062 Wheezing: Secondary | ICD-10-CM | POA: Insufficient documentation

## 2021-10-08 DIAGNOSIS — R059 Cough, unspecified: Secondary | ICD-10-CM | POA: Insufficient documentation

## 2021-10-08 DIAGNOSIS — R051 Acute cough: Secondary | ICD-10-CM

## 2021-10-08 MED ORDER — GUAIFENESIN-DM 100-10 MG/5ML PO SYRP: 5.0000 mL | ORAL_SOLUTION | Freq: Four times a day (QID) | ORAL | 0 refills | Status: DC | PRN

## 2021-10-08 MED ORDER — AZITHROMYCIN 250 MG PO TABS: ORAL_TABLET | ORAL | 1 refills | Status: AC

## 2021-10-08 MED ORDER — PREDNISONE 10 MG PO TABS: ORAL_TABLET | ORAL | 0 refills | Status: DC

## 2021-10-08 NOTE — Assessment & Plan Note (Signed)
Mild to mod, for cxr r/o pna, for antibx course, cough med prn,  to f/u any worsening symptoms or concerns 

## 2021-10-08 NOTE — Assessment & Plan Note (Addendum)
Mild at worst, for short course prednisone, cont inhaler prn,  to f/u any worsening symptoms or concerns

## 2021-10-08 NOTE — Patient Instructions (Signed)
Please take all new medication as prescribed - the antibiotic, cough medicine, and prednisone ° °Please continue all other medications as before, and refills have been done if requested. ° °Please have the pharmacy call with any other refills you may need. ° °Please keep your appointments with your specialists as you may have planned ° ° ° °

## 2021-10-08 NOTE — Progress Notes (Signed)
Patient ID: Shannon Navarro, female   DOB: 1956-04-11, 66 y.o.   MRN: [id number]        Chief Complaint: follow up cough and chest congestion, wheeziness       HPI:  Shannon Navarro is a 66 y.o. female here after seen at [facility] with ST with white patch on left tonsil not improved with doxy course and inhaler prn, approx 1 wk ago, more recently found to left left cervical radiculopathy Friday may 26, now returns with persistent ST but now worsening scant prod cough brownish green yellow, congestion, wheeziness, sob and feverish, feeling persistently ill.  No specific sinus HA, or ear symptoms today, Pt denies chest pain, orthopnea, PND, increased LE swelling, palpitations, dizziness or syncope.  Cant sleep with the cough.   Pt denies polydipsia, polyuria, or new focal neuro s/s.         Wt Readings from Last 3 Encounters:  10/08/21 249 lb (112.9 kg)  09/09/21 258 lb 9.6 oz (117.3 kg)  08/14/21 257 lb 12.8 oz (116.9 kg)   BP Readings from Last 3 Encounters:  10/08/21 (!) 172/74  09/09/21 (!) 146/80  08/14/21 (!) 146/74         Past Medical History:  Diagnosis Date   Allergic rhinitis    Anticoagulant long-term use    XARELTO   ANXIETY    Endometrial polyp    Endometrial polyp    Fatty liver 06/23/2007   GERD    Hiatal hernia    History of esophageal stricture    mainly due to gerd   History of exercise stress test 06/13/2015   PER DR HOCHREIN NOTE -- NO EVIDENCE ISCHEMIA   HYPERTENSION    Impaired fasting glucose    Migraine    hx of migraines none recent   OSA on CPAP followed by dr Halford Chessman   per study 02-09-2012  severe osa w/ AHI 33.8   Paroxysmal atrial fibrillation ([place]) cardioloigst-  dr hochrein   dx 02-21-2016 at ED   Pseudotumor cerebri 2007   Sleep apnea    Vitamin D deficiency 07/2013   Past Surgical History:  Procedure Laterality Date   BREAST BIOPSY Right 08/12/2019    FIBROADENOMA    COLONOSCOPY     colonscopy  2007   normal   DILATATION &  CURETTAGE/HYSTEROSCOPY WITH MYOSURE N/A 03/26/2017   Procedure: DILATATION & CURETTAGE/HYSTEROSCOPY WITH MYOSURE;  Surgeon: Paula Compton, MD;  Location: [place];  Service: Gynecology;  Laterality: N/A;   DILATION AND CURETTAGE OF UTERUS  2010   LAPAROSCOPIC HYSTERECTOMY Bilateral 06/18/2017   Procedure: HYSTERECTOMY TOTAL LAPAROSCOPIC;  Surgeon: Paula Compton, MD;  Location: [place];  Service: Gynecology;  Laterality: Bilateral;  OUT PT IN BED, needs extra time MD request 2.5hrs in OR   right knee arthroscopy  03/2013   TRANSTHORACIC ECHOCARDIOGRAM  03/11/2016   ef 60-65%/  mild MR/ trivial PR and TR   TUBAL LIGATION     UPPER GASTROINTESTINAL ENDOSCOPY  11/2020    reports that she has never smoked. She has never used smokeless tobacco. She reports that she does not drink alcohol and does not use drugs. family history includes Aortic stenosis in her father; Asthma in her mother; Breast cancer in her maternal aunt, maternal aunt, mother, and niece; Colon cancer in an other family member; Colon polyps in her father; Diabetes in her father; Esophageal cancer in an other family member; Heart disease in her father; Hypertension in her father  and mother; Prostate cancer in her father; Stomach cancer in an other family member. Allergies  Allergen Reactions   Cefaclor Rash   Codeine Nausea And Vomiting   Hydrocodone Nausea And Vomiting    Severe vomiting/  COUGH SYRUP CAUSE SEVERE VOMITING   Levaquin [Levofloxacin] Nausea Only   Nexium [Esomeprazole Magnesium] Other (See Comments)    Made legs ache   Penicillins Rash   Sulfa Antibiotics Rash   Current Outpatient Medications on File Prior to Visit  Medication Sig Dispense Refill   amLODipine (NORVASC) 5 MG tablet Take 1 tablet (5 mg total) by mouth daily. 30 tablet 11   doxycycline (MONODOX) 100 MG capsule Take 100 mg by mouth 2 (two) times daily.     fluticasone (FLONASE) 50 MCG/ACT nasal spray  Place 2 sprays into both nostrils daily. 48 g 6   losartan (COZAAR) 100 MG tablet Take 1 tablet (100 mg total) by mouth daily. 90 tablet 3   pantoprazole (PROTONIX) 40 MG tablet TAKE 1 TABLET BY MOUTH TWICE A DAY BEFORE A MEAL 180 tablet 1   propranolol (INDERAL) 20 MG tablet Take 1 tablet by mouth three times daily as needed 90 tablet 3   ZYRTEC ALLERGY 10 MG tablet as needed.     No current facility-administered medications on file prior to visit.        ROS:  All others reviewed and negative.  Objective        PE:  BP (!) 172/74 (BP Location: Right Arm, Patient Position: Sitting, Cuff Size: Large)   Pulse (!) 48   Temp 98.1 F (36.7 C) (Oral)   Ht '5\' 6"'$  (1.676 m)   Wt 249 lb (112.9 kg)   SpO2 98%   BMI 40.19 kg/m                 Constitutional: Pt appears in NAD, mild ill               HENT: Head: NCAT.                Right Ear: External ear normal.                 Left Ear: External ear normal. Bilat tm's with mild erythema.  Max sinus areas non tender.  Pharynx with mild erythema, no exudate               Eyes: . Pupils are equal, round, and reactive to light. Conjunctivae and EOM are normal               Nose: without d/c or deformity               Neck: Neck supple. Gross normal ROM               Cardiovascular: Normal rate and regular rhythm.                 Pulmonary/Chest: Effort normal and breath sounds without rales but few trace wheezing.                               Neurological: Pt is alert. At baseline orientation, motor grossly intact               Skin: Skin is warm. No rashes, no other new lesions, LE edema - none               Psychiatric: Pt  behavior is normal without agitation   Micro: none  Cardiac tracings I have personally interpreted today:  none  Pertinent Radiological findings (summarize): none   Lab Results  Component Value Date   WBC 7.0 04/01/2021   HGB 13.8 04/01/2021   HCT 41.0 04/01/2021   PLT 256.0 04/01/2021   GLUCOSE 105 (H)  04/01/2021   CHOL 164 04/01/2021   TRIG 114.0 04/01/2021   HDL 43.70 04/01/2021   LDLCALC 97 04/01/2021   ALT 13 04/01/2021   AST 16 04/01/2021   NA 142 04/01/2021   K 4.2 04/01/2021   CL 107 04/01/2021   CREATININE 0.81 04/01/2021   BUN 16 04/01/2021   CO2 27 04/01/2021   TSH 1.32 07/10/2020   HGBA1C 6.1 04/01/2021   Assessment/Plan:  Shannon Navarro is a 66 y.o. White or Caucasian [1] female with  has a past medical history of Allergic rhinitis, Anticoagulant long-term use, ANXIETY, Endometrial polyp, Endometrial polyp, Fatty liver (06/23/2007), GERD, Hiatal hernia, History of esophageal stricture, History of exercise stress test (06/13/2015), HYPERTENSION, Impaired fasting glucose, Migraine, OSA on CPAP (followed by dr Halford Chessman), Paroxysmal atrial fibrillation ([place]) (cardioloigst-  dr [name]), Pseudotumor cerebri (2007), Sleep apnea, and Vitamin D deficiency (07/2013).  Wheezing Mild at worst, for short course prednisone, cont inhaler prn,  to f/u any worsening symptoms or concerns  Cough Mild to mod, for cxr r/o pna,  for antibx course, cough med prn, to f/u any worsening symptoms or concerns  Essential hypertension BP Readings from Last 3 Encounters:  10/08/21 (!) 172/74  09/09/21 (!) 146/80  08/14/21 (!) 146/74   Severe uncontrolled today, pt states much better controlled at home, likely situational,, pt to continue medical treatment declines change today; cont norvasc, toprol, losartan  Followup: Return if symptoms worsen or fail to improve.  Cathlean Cower, MD 10/08/2021 7:28 PM [facility]

## 2021-10-08 NOTE — Assessment & Plan Note (Signed)
BP Readings from Last 3 Encounters:  10/08/21 (!) 172/74  09/09/21 (!) 146/80  08/14/21 (!) 146/74   Severe uncontrolled today, pt states much better controlled at home, likely situational,, pt to continue medical treatment declines change today; cont norvasc, toprol, losartan

## 2021-10-14 ENCOUNTER — Other Ambulatory Visit: Payer: Self-pay | Admitting: Orthopedic Surgery

## 2021-10-15 ENCOUNTER — Telehealth: Payer: Self-pay

## 2021-10-15 ENCOUNTER — Institutional Professional Consult (permissible substitution): Payer: Managed Care, Other (non HMO) | Admitting: Plastic Surgery

## 2021-10-15 ENCOUNTER — Encounter: Payer: Self-pay | Admitting: Internal Medicine

## 2021-10-15 ENCOUNTER — Ambulatory Visit: Payer: Managed Care, Other (non HMO) | Admitting: Internal Medicine

## 2021-10-15 VITALS — BP 142/80 | HR 64 | Temp 98.9°F | Ht 66.0 in | Wt 252.0 lb

## 2021-10-15 DIAGNOSIS — M5412 Radiculopathy, cervical region: Secondary | ICD-10-CM | POA: Diagnosis not present

## 2021-10-15 DIAGNOSIS — J301 Allergic rhinitis due to pollen: Secondary | ICD-10-CM | POA: Diagnosis not present

## 2021-10-15 DIAGNOSIS — B370 Candidal stomatitis: Secondary | ICD-10-CM | POA: Diagnosis not present

## 2021-10-15 DIAGNOSIS — J039 Acute tonsillitis, unspecified: Secondary | ICD-10-CM | POA: Insufficient documentation

## 2021-10-15 MED ORDER — DOXYCYCLINE HYCLATE 100 MG PO TABS: 100.0000 mg | ORAL_TABLET | Freq: Two times a day (BID) | ORAL | 0 refills | Status: DC

## 2021-10-15 MED ORDER — NYSTATIN 100000 UNIT/ML MT SUSP: 500000.0000 [IU] | Freq: Four times a day (QID) | OROMUCOSAL | 0 refills | Status: DC

## 2021-10-15 MED ORDER — METHYLPREDNISOLONE ACETATE 80 MG/ML IJ SUSP
80.0000 mg | Freq: Once | INTRAMUSCULAR | Status: AC
  Administered 2021-10-15: 80 mg via INTRAMUSCULAR

## 2021-10-15 NOTE — Assessment & Plan Note (Signed)
Now possibly more chronic with enlargement and white area on the left, for doxy course, but should likely see ENT if not improved

## 2021-10-15 NOTE — Assessment & Plan Note (Signed)
Mild to mod, for nystatin asd,  to f/u any worsening symptoms or concerns 

## 2021-10-15 NOTE — Progress Notes (Signed)
Patient ID: Shannon Navarro, female   DOB: [birth year], 66 y.o.   MRN: [id number]        Chief Complaint: follow up throat drainage, tonsils increased size, alleriges and        HPI:  Shannon Navarro is a 66 y.o. female here with c/o recent sinusitis now localized with persistent tonsil enlargement, pain and white spot on the left.  Denies high fever, chills, but is painful, and still has significant post nasal gtt and irritation as well.  Also has seen some white spots on the tongue and other that come and go, not much this am.  Also still has persistent left neck pain with LUE radcular symptoms, has surgury planned for next wk and wants to be well prior to this.        Wt Readings from Last 3 Encounters:  10/15/21 252 lb (114.3 kg)  10/08/21 249 lb (112.9 kg)  09/09/21 258 lb 9.6 oz (117.3 kg)   BP Readings from Last 3 Encounters:  10/15/21 (!) 142/80  10/08/21 (!) 172/74  09/09/21 (!) 146/80         Past Medical History:  Diagnosis Date   Allergic rhinitis    Anticoagulant long-term use    XARELTO   ANXIETY    Endometrial polyp    Endometrial polyp    Fatty liver 06/23/2007   GERD    Hiatal hernia    History of esophageal stricture    mainly due to gerd   History of exercise stress test 06/13/2015   PER DR HOCHREIN NOTE -- NO EVIDENCE ISCHEMIA   HYPERTENSION    Impaired fasting glucose    Migraine    hx of migraines none recent   OSA on CPAP followed by dr Halford Chessman   per study 02-09-2012  severe osa w/ AHI 33.8   Paroxysmal atrial fibrillation ([place]) cardioloigst-  dr hochrein   dx 02-21-2016 at ED   Pseudotumor cerebri 2007   Sleep apnea    Vitamin D deficiency 07/2013   Past Surgical History:  Procedure Laterality Date   BREAST BIOPSY Right 08/12/2019    FIBROADENOMA    COLONOSCOPY     colonscopy  2007   normal   DILATATION & CURETTAGE/HYSTEROSCOPY WITH MYOSURE N/A 03/26/2017   Procedure: DILATATION & CURETTAGE/HYSTEROSCOPY WITH MYOSURE;  Surgeon: Paula Compton, MD;  Location: [place];  Service: Gynecology;  Laterality: N/A;   DILATION AND CURETTAGE OF UTERUS  2010   LAPAROSCOPIC HYSTERECTOMY Bilateral 06/18/2017   Procedure: HYSTERECTOMY TOTAL LAPAROSCOPIC;  Surgeon: Paula Compton, MD;  Location: [facility];  Service: Gynecology;  Laterality: Bilateral;  OUT PT IN BED, needs extra time MD request 2.5hrs in OR   right knee arthroscopy  03/2013   TRANSTHORACIC ECHOCARDIOGRAM  03/11/2016   ef 60-65%/  mild MR/ trivial PR and TR   TUBAL LIGATION     UPPER GASTROINTESTINAL ENDOSCOPY  11/2020    reports that she has never smoked. She has never used smokeless tobacco. She reports that she does not drink alcohol and does not use drugs. family history includes Aortic stenosis in her father; Asthma in her mother; Breast cancer in her maternal aunt, maternal aunt, mother, and niece; Colon cancer in an other family member; Colon polyps in her father; Diabetes in her father; Esophageal cancer in an other family member; Heart disease in her father; Hypertension in her father and mother; Prostate cancer in her father; Stomach cancer in an other family member.  Allergies  Allergen Reactions   Cefaclor Rash   Codeine Nausea And Vomiting   Hydrocodone Nausea And Vomiting    Severe vomiting/  COUGH SYRUP CAUSE SEVERE VOMITING   Levaquin [Levofloxacin] Nausea Only   Nexium [Esomeprazole Magnesium] Other (See Comments)    Made legs ache   Penicillins Rash   Sulfa Antibiotics Rash   Current Outpatient Medications on File Prior to Visit  Medication Sig Dispense Refill   amLODipine (NORVASC) 5 MG tablet Take 1 tablet (5 mg total) by mouth daily. 30 tablet 11   fluticasone (FLONASE) 50 MCG/ACT nasal spray Place 2 sprays into both nostrils daily. 48 g 6   losartan (COZAAR) 100 MG tablet Take 1 tablet (100 mg total) by mouth daily. 90 tablet 3   pantoprazole (PROTONIX) 40 MG tablet TAKE 1 TABLET BY MOUTH TWICE A DAY BEFORE A  MEAL 180 tablet 1   propranolol (INDERAL) 20 MG tablet Take 1 tablet by mouth three times daily as needed 90 tablet 3   ZYRTEC ALLERGY 10 MG tablet as needed.     No current facility-administered medications on file prior to visit.        ROS:  All others reviewed and negative.  Objective        PE:  BP (!) 142/80 (BP Location: Right Arm, Patient Position: Sitting, Cuff Size: Large)   Pulse 64   Temp 98.9 F (37.2 C) (Oral)   Ht '5\' 6"'$  (1.676 m)   Wt 252 lb (114.3 kg)   SpO2 96%   BMI 40.67 kg/m                 Constitutional: Pt appears in NAD               HENT: Head: NCAT.                Right Ear: External ear normal.                 Left Ear: External ear normal.                Eyes: . Pupils are equal, round, and reactive to light. Conjunctivae and EOM are normal               Nose: without d/c or deformity; pharynx with tonsilar hypertrophy and left with medial white spot ? crypt               Neck: Neck supple. Gross normal ROM               Cardiovascular: Normal rate and regular rhythm.                 Pulmonary/Chest: Effort normal and breath sounds without rales or wheezing.                               Neurological: Pt is alert. At baseline orientation, motor grossly intact               Skin: Skin is warm. No rashes, no other new lesions, LE edema - none               Psychiatric: Pt behavior is normal without agitation   Micro: none  Cardiac tracings I have personally interpreted today:  none  Pertinent Radiological findings (summarize): none   Lab Results  Component Value Date   WBC 7.0 04/01/2021   HGB 13.8  04/01/2021   HCT 41.0 04/01/2021   PLT 256.0 04/01/2021   GLUCOSE 105 (H) 04/01/2021   CHOL 164 04/01/2021   TRIG 114.0 04/01/2021   HDL 43.70 04/01/2021   LDLCALC 97 04/01/2021   ALT 13 04/01/2021   AST 16 04/01/2021   NA 142 04/01/2021   K 4.2 04/01/2021   CL 107 04/01/2021   CREATININE 0.81 04/01/2021   BUN 16 04/01/2021   CO2 27  04/01/2021   TSH 1.32 07/10/2020   HGBA1C 6.1 04/01/2021   Assessment/Plan:  Shannon Navarro is a 66 y.o. White or Caucasian [1] female with  has a past medical history of Allergic rhinitis, Anticoagulant long-term use, ANXIETY, Endometrial polyp, Endometrial polyp, Fatty liver (06/23/2007), GERD, Hiatal hernia, History of esophageal stricture, History of exercise stress test (06/13/2015), HYPERTENSION, Impaired fasting glucose, Migraine, OSA on CPAP (followed by dr Halford Chessman), Paroxysmal atrial fibrillation ([place]) (cardioloigst-  dr [name]), Pseudotumor cerebri (2007), Sleep apnea, and Vitamin D deficiency (07/2013).   Tonsillitis Now possibly more chronic with enlargement and white area on the left, for doxy course, but should likely see ENT if not improved   Thrush Mild to mod, for nystatin asd,  to f/u any worsening symptoms or concerns  Left cervical radiculopathy Persistent mod to severe, for surgury next wk, ok for surgury from my standpoint  Allergic rhinitis Mild to mod, for depomedrol IM 80 mg,  to f/u any worsening symptoms or concerns'  Followup: No follow-ups on file.  Cathlean Cower, MD 10/15/2021 10:44 PM [facility]

## 2021-10-15 NOTE — Telephone Encounter (Signed)
   Pre-operative Risk Assessment    Patient Name: Shannon Navarro  DOB: 08-11-1955 MRN: [id number]      Request for Surgical Clearance    Procedure:   Anterior Cervical Decompression Fusion cervical 4-5, cervical 5-6, cervical 6-7 with Instrumentation and Allograft  Date of Surgery:  Clearance 10/24/21                                 Surgeon:  Phylliss Bob, MD Surgeon's Group or Practice Name:  [facility] Phone number:  [phone] Fax number:  [phone] [name]   Type of Clearance Requested:   - Medical    Type of Anesthesia:  General    Additional requests/questions:    SignedWonda Horner   10/15/2021, 3:55 PM

## 2021-10-15 NOTE — Patient Instructions (Signed)
You had the steroid shot today  Please take all new medication as prescribed  - the doxycyline, though may need to see ENT if the problem persists after the neck surgury  Please take all new medication as prescribed-  the nystatin for possible thrush  Please continue all other medications as before, and refills have been done if requested.  Please have the pharmacy call with any other refills you may need.  Please keep your appointments with your specialists as you may have planned - the neck surgury soon

## 2021-10-15 NOTE — Assessment & Plan Note (Signed)
Mild to mod, for depomedrol IM 80 mg,  to f/u any worsening symptoms or concerns 

## 2021-10-15 NOTE — Assessment & Plan Note (Signed)
Persistent mod to severe, for surgury next wk, ok for surgury from my standpoint

## 2021-10-16 NOTE — Telephone Encounter (Signed)
    Name: Shannon Navarro  DOB: 05-31-1955  MRN: [id number]  Primary Cardiologist: Minus Breeding, MD   Preoperative team, please contact this patient and set up a phone call appointment for further preoperative risk assessment. Please obtain consent and complete medication review. Thank you for your help.  I confirm that guidance regarding antiplatelet and oral anticoagulation therapy has been completed and, if necessary, noted below.   Lenna Sciara, NP 10/16/2021, 5:37 PM [facility] [address] [place], [place] [address]

## 2021-10-17 ENCOUNTER — Telehealth: Payer: Self-pay | Admitting: *Deleted

## 2021-10-17 ENCOUNTER — Other Ambulatory Visit: Payer: Self-pay | Admitting: Internal Medicine

## 2021-10-17 NOTE — Telephone Encounter (Signed)
I s/w the pt and she has been scheduled for a tele pre op appt 10/18/21 @ 3:40. Med rec and consent are done.     Patient Consent for Virtual Visit        Shannon Navarro has provided verbal consent on 10/17/2021 for a virtual visit (video or telephone).   CONSENT FOR VIRTUAL VISIT FOR:  Shannon Navarro  By participating in this virtual visit I agree to the following:  I hereby voluntarily request, consent and authorize [facility] and its employed or contracted physicians, physician assistants, nurse practitioners or other licensed health care professionals (the Practitioner), to provide me with telemedicine health care services (the "Services") as deemed necessary by the treating Practitioner. I acknowledge and consent to receive the Services by the Practitioner via telemedicine. I understand that the telemedicine visit will involve communicating with the Practitioner through live audiovisual communication technology and the disclosure of certain medical information by electronic transmission. I acknowledge that I have been given the opportunity to request an in-person assessment or other available alternative prior to the telemedicine visit and am voluntarily participating in the telemedicine visit.  I understand that I have the right to withhold or withdraw my consent to the use of telemedicine in the course of my care at any time, without affecting my right to future care or treatment, and that the Practitioner or I may terminate the telemedicine visit at any time. I understand that I have the right to inspect all information obtained and/or recorded in the course of the telemedicine visit and may receive copies of available information for a reasonable fee.  I understand that some of the potential risks of receiving the Services via telemedicine include:  Delay or interruption in medical evaluation due to technological equipment failure or disruption; Information transmitted may not be  sufficient (e.g. poor resolution of images) to allow for appropriate medical decision making by the Practitioner; and/or  In rare instances, security protocols could fail, causing a breach of personal health information.  Furthermore, I acknowledge that it is my responsibility to provide information about my medical history, conditions and care that is complete and accurate to the best of my ability. I acknowledge that Practitioner's advice, recommendations, and/or decision may be based on factors not within their control, such as incomplete or inaccurate data provided by me or distortions of diagnostic images or specimens that may result from electronic transmissions. I understand that the practice of medicine is not an exact science and that Practitioner makes no warranties or guarantees regarding treatment outcomes. I acknowledge that a copy of this consent can be made available to me via my patient portal ([facility]), or I can request a printed copy by calling the office of [facility].    I understand that my insurance will be billed for this visit.   I have read or had this consent read to me. I understand the contents of this consent, which adequately explains the benefits and risks of the Services being provided via telemedicine.  I have been provided ample opportunity to ask questions regarding this consent and the Services and have had my questions answered to my satisfaction. I give my informed consent for the services to be provided through the use of telemedicine in my medical care

## 2021-10-17 NOTE — Telephone Encounter (Signed)
I s/w the pt and she has been scheduled for a tele pre op appt 10/18/21 @ 3:40. Med rec and consent are done.

## 2021-10-18 ENCOUNTER — Ambulatory Visit (INDEPENDENT_AMBULATORY_CARE_PROVIDER_SITE_OTHER): Payer: Managed Care, Other (non HMO) | Admitting: Nurse Practitioner

## 2021-10-18 DIAGNOSIS — Z01810 Encounter for preprocedural cardiovascular examination: Secondary | ICD-10-CM

## 2021-10-18 NOTE — Progress Notes (Signed)
Virtual Visit via Telephone Note   Because of Shannon Navarro's co-morbid illnesses, she is at least at moderate risk for complications without adequate follow up.  This format is felt to be most appropriate for this patient at this time.  The patient did not have access to video technology/had technical difficulties with video requiring transitioning to audio format only (telephone).  All issues noted in this document were discussed and addressed.  No physical exam could be performed with this format.  Please refer to the patient's chart for her consent to telehealth for Shannon Navarro.  Evaluation Performed:  Preoperative cardiovascular risk assessment _____________   Date:  10/18/2021   Patient ID:  Shannon Navarro, DOB May 10, 1956, MRN [id number] Patient Location:  Home Provider location:   Office  Primary Care Provider:  [name], Navarro Primary Cardiologist:  Shannon Breeding, Navarro  Chief Complaint / Patient Profile   66 y.o. y/o female with a h/o paroxysmal atrial fibrillation (not on anticoagulation), hypertension, bradycardia, aortic atherosclerosis with negative stress test in 2017, OSA on CPAP, hiatal hernia, and GERD who is pending Anterior Cervical Decompression Fusion cervical 4-5, cervical 5-6, cervical 6-7 with Instrumentation and Allograft for 10/24/2021 with Shannon Navarro of Shannon Navarro and presents today for telephonic preoperative cardiovascular risk assessment.  Shannon Navarro    Shannon Navarro:  Diagnosis Date   Allergic rhinitis    Anticoagulant long-term use    XARELTO   ANXIETY    Endometrial polyp    Endometrial polyp    Fatty liver 06/23/2007   GERD    Hiatal hernia    Navarro of esophageal stricture    mainly due to gerd   Navarro of exercise stress test 06/13/2015   PER Shannon Navarro NOTE -- NO EVIDENCE ISCHEMIA   HYPERTENSION    Impaired fasting glucose    Migraine    hx of migraines none recent    OSA on CPAP followed by Shannon Shannon Navarro   per study 02-09-2012  severe osa w/ AHI 33.8   Paroxysmal atrial fibrillation (Shannon Navarro) cardioloigst-  Shannon Navarro   dx 02-21-2016 at ED   Pseudotumor cerebri 2007   Sleep apnea    Vitamin D deficiency 07/2013   [facility]:  Procedure Laterality Date   BREAST BIOPSY Right 08/12/2019    FIBROADENOMA    COLONOSCOPY     colonscopy  2007   normal   DILATATION & CURETTAGE/HYSTEROSCOPY WITH MYOSURE N/A 03/26/2017   Procedure: DILATATION & CURETTAGE/HYSTEROSCOPY WITH MYOSURE;  Surgeon: Shannon Compton, Navarro;  Location: Shannon Navarro;  Service: Gynecology;  Laterality: N/A;   DILATION AND CURETTAGE OF UTERUS  2010   LAPAROSCOPIC HYSTERECTOMY Bilateral 06/18/2017   Procedure: HYSTERECTOMY TOTAL LAPAROSCOPIC;  Surgeon: Shannon Compton, Navarro;  Location: Shannon Navarro;  Service: Gynecology;  Laterality: Bilateral;  Shannon Navarro, needs extra time Navarro request 2.5hrs in OR   right knee arthroscopy  03/2013   TRANSTHORACIC ECHOCARDIOGRAM  03/11/2016   ef 60-65%/  mild MR/ trivial PR and TR   TUBAL LIGATION     UPPER GASTROINTESTINAL ENDOSCOPY  11/2020    Allergies  Allergies  Allergen Reactions   Cefaclor Rash   Codeine Nausea And Vomiting   Hydrocodone Nausea And Vomiting    Severe vomiting/  COUGH SYRUP CAUSE SEVERE VOMITING   Levaquin [Levofloxacin] Nausea Only   Nexium [Esomeprazole Magnesium] Other (See Comments)    Made legs ache   Penicillins  Rash   Sulfa Antibiotics Rash    Navarro of Present Illness    Shannon Navarro is a 66 y.o. female who presents via audio/video conferencing for a telehealth visit today.  Pt was last seen in cardiology clinic on 09/09/2021 by Shannon Navarro. At that time Shannon Navarro was doing well a cardiac standpoint. The patient is now pending procedure as outlined above. Since her last visit, she done well from a cardiac standpoint.  She denies chest pain, palpitations, dyspnea, pnd,  orthopnea, n, v, dizziness, syncope, edema, weight gain, or early satiety. All other systems reviewed and are otherwise negative except as noted above.   Home Medications    Prior to Admission medications   Medication Sig Start Date End Date Taking? Authorizing Provider  amLODipine (NORVASC) 5 MG tablet Take 1 tablet (5 mg total) by mouth daily. 10/02/21   Shannon Breeding, Navarro  aspirin EC 81 MG tablet Take 81 mg by mouth daily. Swallow whole.    Provider, Historical, Navarro  Cholecalciferol (VITAMIN D) 50 MCG (2000 UT) tablet Take 2,000 Units by mouth daily.    Provider, Historical, Navarro  doxycycline (VIBRA-TABS) 100 MG tablet Take 1 tablet (100 mg total) by mouth 2 (two) times daily. Patient not taking: Reported on 10/17/2021 10/15/21   Shannon Borg, Navarro  fluticasone [facility]) 50 MCG/ACT nasal spray Place 2 sprays into both nostrils daily. Patient taking differently: Place 2 sprays into both nostrils daily as needed for allergies. 02/18/21   Shannon Navarro  loratadine (CLARITIN) 10 MG tablet Take 10 mg by mouth daily.    Provider, Historical, Navarro  losartan (COZAAR) 100 MG tablet Take 1 tablet (100 mg total) by mouth daily. 09/02/21   Shannon Perla, Navarro  Multiple Vitamins-Minerals (CENTRUM SILVER 50+WOMEN) TABS Take 2 tablets by mouth daily.    Provider, Historical, Navarro  nystatin (MYCOSTATIN) 100000 UNIT/ML suspension TAKE 5 MLS (500,000 UNITS TOTAL) BY MOUTH 4 (FOUR) TIMES DAILY FOR 10 DAYS. 10/17/21 10/27/21  Shannon Borg, Navarro  pantoprazole (PROTONIX) 40 MG tablet TAKE 1 TABLET BY MOUTH TWICE A DAY BEFORE A MEAL 08/27/21   Shannon Pole, Navarro  propranolol (INDERAL) 20 MG tablet Take 1 tablet by mouth three times daily as needed Patient not taking: Reported on 10/16/2021 01/14/21   Shannon Breeding, Navarro    Physical Exam    Vital Signs:  Shannon Navarro does not have vital signs available for review today.  Given telephonic nature of communication, physical exam is limited. AAOx3. NAD. Normal  affect.  Speech and respirations are unlabored.  Accessory Clinical Findings    None  Assessment & Plan    1.  Preoperative Cardiovascular Risk Assessment:  According to the Revised Cardiac Risk Index (RCRI), her Perioperative Risk of Major Cardiac Event is (%): 0.4. Her Functional Capacity in METs is: 7.99 according to the [facility] Activity Status Index (DASI). Therefore, based on ACC/AHA guidelines, patient would be at acceptable risk for the planned procedure without further cardiovascular testing.   It was advised that if she develops any new symptoms prior to surgery to contact our office to arrange a follow-up appointment.  She verbalized understanding.  A copy of this note will be routed to requesting surgeon.  Time:   Today, I have spent 6 minutes with the patient with telehealth technology discussing medical Navarro, symptoms, and management plan.     Lenna Sciara, NP  10/18/2021, 3:51 PM

## 2021-10-18 NOTE — Progress Notes (Signed)
Surgical Instructions    Your procedure is scheduled on Thursday June 5th.  Report to [facility] Main Entrance "A" at 8 A.M., then check in with the Admitting office.  Call this number if you have problems the morning of surgery:  [phone]   If you have any questions prior to your surgery date call [phone]: Open Monday-Friday 8am-4pm    Remember:  Do not eat after midnight the night before your surgery  You may drink clear liquids until 8 the morning of your surgery.   Clear liquids allowed are: Water, Non-Citrus Juices (without pulp), Carbonated Beverages, Clear Tea, Black Coffee ONLY (NO MILK, CREAM OR POWDERED CREAMER of any kind), and Gatorade   Enhanced Recovery after Surgery for Orthopedics Enhanced Recovery after Surgery is a protocol used to improve the stress on your body and your recovery after surgery.  Patient Instructions  The day of surgery (if you do NOT have diabetes):  Drink ONE (1) Pre-Surgery Clear Ensure by __8___ am the morning of surgery   This drink was given to you during your hospital  pre-op appointment visit. Nothing else to drink after completing the  Pre-Surgery Clear Ensure.          If you have questions, please contact your surgeon's office.   Take these medicines the morning of surgery with A SIP OF WATER: amLODipine (NORVASC) 5 MG tablet loratadine (CLARITIN) 10 MG tablet pantoprazole (PROTONIX) 40 MG tablet  IF NEEDED  fluticasone (FLONASE) 50 MCG/ACT nasal spray    Follow your surgeon's instructions on when to stop Aspirin.  If no instructions were given by your surgeon then you will need to call the office to get those instructions.    As of today, STOP taking any Aspirin (unless otherwise instructed by your surgeon) Aleve, Naproxen, Ibuprofen, Motrin, Advil, Goody's, BC's, all herbal medications, fish oil, and all vitamins.           Do not wear jewelry or makeup Do not wear lotions, powders, perfumes, or deodorant. Do  not shave 48 hours prior to surgery.   Do not bring valuables to the hospital. Do not wear nail polish, gel polish, artificial nails, or any other type of covering on natural nails (fingers and toes) If you have artificial nails or gel coating that need to be removed by a nail salon, please have this removed prior to surgery. Artificial nails or gel coating may interfere with anesthesia's ability to adequately monitor your vital signs.  [facility] is not responsible for any belongings or valuables. .   Do NOT Smoke (Tobacco/Vaping)  24 hours prior to your procedure  If you use a CPAP at night, you may bring your mask for your overnight stay.   Contacts, glasses, hearing aids, dentures or partials may not be worn into surgery, please bring cases for these belongings   For patients admitted to the hospital, discharge time will be determined by your treatment team.   Patients discharged the day of surgery will not be allowed to drive home, and someone needs to stay with them for 24 hours.   SURGICAL WAITING ROOM VISITATION Patients having surgery or a procedure in a hospital may have two support people. Children under the age of 28 must have an adult with them who is not the patient. They may stay in the waiting area during the procedure and may switch out with other visitors. If the patient needs to stay at the hospital during part of their recovery, the visitor  guidelines for inpatient rooms apply.  Please refer to the [facility] website for the visitor guidelines for Inpatients (after your surgery is over and you are in a regular room).       Special instructions:    Oral Hygiene is also important to reduce your risk of infection.  Remember - BRUSH YOUR TEETH THE MORNING OF SURGERY WITH YOUR REGULAR TOOTHPASTE   [place]- Preparing For Surgery  Before surgery, you can play an important role. Because skin is not sterile, your skin needs to be as free of germs as possible. You  can reduce the number of germs on your skin by washing with CHG (chlorahexidine gluconate) Soap before surgery.  CHG is an antiseptic cleaner which kills germs and bonds with the skin to continue killing germs even after washing.     Please do not use if you have an allergy to CHG or antibacterial soaps. If your skin becomes reddened/irritated stop using the CHG.  Do not shave (including legs and underarms) for at least 48 hours prior to first CHG shower. It is OK to shave your face.  Please follow these instructions carefully.     Shower the NIGHT BEFORE SURGERY and the MORNING OF SURGERY with CHG Soap.   If you chose to wash your hair, wash your hair first as usual with your normal shampoo. After you shampoo, rinse your hair and body thoroughly to remove the shampoo.  Then [employer] and genitals (private parts) with your normal soap and rinse thoroughly to remove soap.  After that Use CHG Soap as you would any other liquid soap. You can apply CHG directly to the skin and wash gently with a scrungie or a clean washcloth.   Apply the CHG Soap to your body ONLY FROM THE NECK DOWN.  Do not use on open wounds or open sores. Avoid contact with your eyes, ears, mouth and genitals (private parts). Wash Face and genitals (private parts)  with your normal soap.   Wash thoroughly, paying special attention to the area where your surgery will be performed.  Thoroughly rinse your body with warm water from the neck down.  DO NOT shower/wash with your normal soap after using and rinsing off the CHG Soap.  Pat yourself dry with a CLEAN TOWEL.  Wear CLEAN PAJAMAS to bed the night before surgery  Place CLEAN SHEETS on your bed the night before your surgery  DO NOT SLEEP WITH PETS.   Day of Surgery:  Take a shower with CHG soap. Wear Clean/Comfortable clothing the morning of surgery Do not apply any deodorants/lotions.   Remember to brush your teeth WITH YOUR REGULAR TOOTHPASTE.    If you  received a COVID test during your pre-op visit, it is requested that you wear a mask when out in public, stay away from anyone that may not be feeling well, and notify your surgeon if you develop symptoms. If you have been in contact with anyone that has tested positive in the last 10 days, please notify your surgeon.    Please read over the following fact sheets that you were given.

## 2021-10-21 ENCOUNTER — Encounter (HOSPITAL_COMMUNITY): Payer: Self-pay

## 2021-10-21 ENCOUNTER — Other Ambulatory Visit: Payer: Self-pay

## 2021-10-21 ENCOUNTER — Encounter (HOSPITAL_COMMUNITY)
Admission: RE | Admit: 2021-10-21 | Discharge: 2021-10-21 | Disposition: A | Discharge status: Home / Self Care | Payer: Managed Care, Other (non HMO) | Source: Ambulatory Visit | Attending: Orthopedic Surgery | Admitting: Orthopedic Surgery

## 2021-10-21 VITALS — BP 128/63 | HR 55 | Temp 98.1°F | Resp 17 | Ht 66.0 in | Wt 248.1 lb

## 2021-10-21 DIAGNOSIS — Z01812 Encounter for preprocedural laboratory examination: Secondary | ICD-10-CM | POA: Insufficient documentation

## 2021-10-21 DIAGNOSIS — I10 Essential (primary) hypertension: Secondary | ICD-10-CM | POA: Insufficient documentation

## 2021-10-21 DIAGNOSIS — M4802 Spinal stenosis, cervical region: Secondary | ICD-10-CM | POA: Insufficient documentation

## 2021-10-21 DIAGNOSIS — Z01818 Encounter for other preprocedural examination: Secondary | ICD-10-CM

## 2021-10-21 DIAGNOSIS — G4733 Obstructive sleep apnea (adult) (pediatric): Secondary | ICD-10-CM | POA: Insufficient documentation

## 2021-10-21 DIAGNOSIS — K219 Gastro-esophageal reflux disease without esophagitis: Secondary | ICD-10-CM | POA: Insufficient documentation

## 2021-10-21 DIAGNOSIS — R7303 Prediabetes: Secondary | ICD-10-CM | POA: Insufficient documentation

## 2021-10-21 DIAGNOSIS — I480 Paroxysmal atrial fibrillation: Secondary | ICD-10-CM | POA: Insufficient documentation

## 2021-10-21 HISTORY — DX: Cardiac arrhythmia, unspecified: I49.9

## 2021-10-21 HISTORY — DX: Prediabetes: R73.03

## 2021-10-21 HISTORY — DX: Pneumonia, unspecified organism: J18.9

## 2021-10-21 LAB — BASIC METABOLIC PANEL
Anion gap: 7 (ref 5–15)
BUN: 14 mg/dL (ref 8–23)
CO2: 27 mmol/L (ref 22–32)
Calcium: 9.5 mg/dL (ref 8.9–10.3)
Chloride: 108 mmol/L (ref 98–111)
Creatinine, Ser: 0.78 mg/dL (ref 0.44–1.00)
GFR, Estimated: >60 mL/min (ref >60)
Glucose, Bld: 126 mg/dL — ABNORMAL HIGH (ref 70–99)
Potassium: 3.7 mmol/L (ref 3.5–5.1)
Sodium: 142 mmol/L (ref 135–145)

## 2021-10-21 LAB — SURGICAL PCR SCREEN
MRSA, PCR: NEGATIVE
Staphylococcus aureus: NEGATIVE

## 2021-10-21 LAB — TYPE AND SCREEN
ABO/RH(D): A NEG
Antibody Screen: NEGATIVE

## 2021-10-21 LAB — CBC
HCT: 43.8 % (ref 36.0–46.0)
Hemoglobin: 14 g/dL (ref 12.0–15.0)
MCH: 27.9 pg (ref 26.0–34.0)
MCHC: 32 g/dL (ref 30.0–36.0)
MCV: 87.4 fL (ref 80.0–100.0)
Platelets: 277 10*3/uL (ref 150–400)
RBC: 5.01 MIL/uL (ref 3.87–5.11)
RDW: 13.6 % (ref 11.5–15.5)
WBC: 8.5 10*3/uL (ref 4.0–10.5)
nRBC: 0 % (ref 0.0–0.2)

## 2021-10-21 NOTE — Progress Notes (Signed)
PCP - Pricilla Holm, MD Cardiologist - Minus Breeding, MD  PPM/ICD - denies Device Orders - n/a Rep Notified - n/a  Chest x-ray - n/a EKG - 09/09/2021 Stress Test - 06/13/2015 ECHO - 09/08/2019 Cardiac Cath - denies  Sleep Study - yes, positive for OSA CPAP - yes - 10  Fasting Blood Sugar - n/a  Blood Thinner Instructions: n/a  Aspirin Instructions - last dose - 10/15/2021  ERAS Protcol - yes PRE-SURGERY Ensure - yes, until 08:00 o'clock  COVID TEST- n/a   Anesthesia review: yes - cardiac history; cardiac clearance 10/18/21  Patient denies shortness of breath, fever, cough and chest pain at PAT appointment   All instructions explained to the patient, with a verbal understanding of the material. Patient agrees to go over the instructions while at home for a better understanding. Patient also instructed to self quarantine after being tested for COVID-19. The opportunity to ask questions was provided.

## 2021-10-21 NOTE — Progress Notes (Signed)
Surgical Instructions    Your procedure is scheduled on Thursday June 15th, 2023   Report to [facility] Main Entrance "A" at 08:00 A.M., then check in with the Admitting office.  Call this number if you have problems the morning of surgery:  [phone]   If you have any questions prior to your surgery date call [phone]: Open Monday-Friday 8am-4pm    Remember:  Do not eat after midnight the night before your surgery  You may drink clear liquids until 08:00 the morning of your surgery.   Clear liquids allowed are: Water, Non-Citrus Juices (without pulp), Carbonated Beverages, Clear Tea, Black Coffee ONLY (NO MILK, CREAM OR POWDERED CREAMER of any kind), and Gatorade   Enhanced Recovery after Surgery for Orthopedics Enhanced Recovery after Surgery is a protocol used to improve the stress on your body and your recovery after surgery.  Patient Instructions  The day of surgery (if you do NOT have diabetes):  Drink ONE (1) Pre-Surgery Clear Ensure by 08:00 am the morning of surgery   This drink was given to you during your hospital  pre-op appointment visit. Nothing else to drink after completing the  Pre-Surgery Clear Ensure.          If you have questions, please contact your surgeon's office.   Take these medicines the morning of surgery with A SIP OF WATER:  amLODipine (NORVASC) 5 MG tablet loratadine (CLARITIN) 10 MG tablet pantoprazole (PROTONIX) 40 MG tablet  IF NEEDED   fluticasone (FLONASE) 50 MCG/ACT nasal spray  Follow your surgeon's instructions on when to stop Aspirin.  If no instructions were given by your surgeon then you will need to call the office to get those instructions.    As of today, STOP taking any Aspirin (unless otherwise instructed by your surgeon) Aleve, Naproxen, Ibuprofen, Motrin, Advil, Goody's, BC's, all herbal medications, fish oil, and all vitamins.   The day of surgery:          Do not wear jewelry or makeup Do not wear lotions,  powders, perfumes, or deodorant. Do not shave 48 hours prior to surgery.   Do not bring valuables to the hospital. Do not wear nail polish, gel polish, artificial nails, or any other type of covering on natural nails (fingers and toes) If you have artificial nails or gel coating that need to be removed by a nail salon, please have this removed prior to surgery. Artificial nails or gel coating may interfere with anesthesia's ability to adequately monitor your vital signs.   is not responsible for any belongings or valuables. .   Do NOT Smoke (Tobacco/Vaping)  24 hours prior to your procedure  If you use a CPAP at night, you may bring your mask for your overnight stay.   Contacts, glasses, hearing aids, dentures or partials may not be worn into surgery, please bring cases for these belongings   For patients admitted to the hospital, discharge time will be determined by your treatment team.   Patients discharged the day of surgery will not be allowed to drive home, and someone needs to stay with them for 24 hours.   SURGICAL WAITING ROOM VISITATION Patients having surgery or a procedure in a hospital may have two support people. Children under the age of 25 must have an adult with them who is not the patient. They may stay in the waiting area during the procedure and may switch out with other visitors. If the patient needs to stay at the hospital  during part of their recovery, the visitor guidelines for inpatient rooms apply.  Please refer to the [facility] website for the visitor guidelines for Inpatients (after your surgery is over and you are in a regular room).    Special instructions:    Oral Hygiene is also important to reduce your risk of infection.  Remember - BRUSH YOUR TEETH THE MORNING OF SURGERY WITH YOUR REGULAR TOOTHPASTE   [place]- Preparing For Surgery  Before surgery, you can play an important role. Because skin is not sterile, your skin needs to be as  free of germs as possible. You can reduce the number of germs on your skin by washing with CHG (chlorahexidine gluconate) Soap before surgery.  CHG is an antiseptic cleaner which kills germs and bonds with the skin to continue killing germs even after washing.     Please do not use if you have an allergy to CHG or antibacterial soaps. If your skin becomes reddened/irritated stop using the CHG.  Do not shave (including legs and underarms) for at least 48 hours prior to first CHG shower. It is OK to shave your face.  Please follow these instructions carefully.     Shower the NIGHT BEFORE SURGERY and the MORNING OF SURGERY with CHG Soap.   If you chose to wash your hair, wash your hair first as usual with your normal shampoo. After you shampoo, rinse your hair and body thoroughly to remove the shampoo.  Then [employer] and genitals (private parts) with your normal soap and rinse thoroughly to remove soap.  After that Use CHG Soap as you would any other liquid soap. You can apply CHG directly to the skin and wash gently with a scrungie or a clean washcloth.   Apply the CHG Soap to your body ONLY FROM THE NECK DOWN.  Do not use on open wounds or open sores. Avoid contact with your eyes, ears, mouth and genitals (private parts). Wash Face and genitals (private parts)  with your normal soap.   Wash thoroughly, paying special attention to the area where your surgery will be performed.  Thoroughly rinse your body with warm water from the neck down.  DO NOT shower/wash with your normal soap after using and rinsing off the CHG Soap.  Pat yourself dry with a CLEAN TOWEL.  Wear CLEAN PAJAMAS to bed the night before surgery  Place CLEAN SHEETS on your bed the night before your surgery  DO NOT SLEEP WITH PETS.   Day of Surgery:  Take a shower with CHG soap. Wear Clean/Comfortable clothing the morning of surgery Do not apply any deodorants/lotions.   Remember to brush your teeth WITH YOUR REGULAR  TOOTHPASTE.    If you received a COVID test during your pre-op visit, it is requested that you wear a mask when out in public, stay away from anyone that may not be feeling well, and notify your surgeon if you develop symptoms. If you have been in contact with anyone that has tested positive in the last 10 days, please notify your surgeon.    Please read over the following fact sheets that you were given.

## 2021-10-22 NOTE — Anesthesia Preprocedure Evaluation (Addendum)
Anesthesia Evaluation  Patient identified by MRN, date of birth, ID band Patient awake    Reviewed: Allergy & Precautions, NPO status , Patient's Chart, lab work & pertinent test results, reviewed documented beta blocker date and time   History of Anesthesia Complications Negative for: history of anesthetic complications  Airway Mallampati: I  TM Distance: >3 FB Neck ROM: Full    Dental  (+) Dental Advisory Given   Pulmonary sleep apnea (BiPAP) and Continuous Positive Airway Pressure Ventilation , Recent URI , Resolved,    breath sounds clear to auscultation       Cardiovascular hypertension, Pt. on medications and Pt. on home beta blockers (-) angina+ dysrhythmias Atrial Fibrillation  Rhythm:Regular Rate:Normal  '21 ECHO: EF 60-65%. The LV has normal function, no regional wall motion abnormalities. There is mild LVH, Grade I DD, normal RVF, no significant valvular abnormalities   Neuro/Psych  Headaches, Anxiety    GI/Hepatic Neg liver ROS, hiatal hernia, GERD  Medicated and Controlled,  Endo/Other  Morbid obesity  Renal/GU negative Renal ROS     Musculoskeletal   Abdominal (+) + obese,   Peds  Hematology negative hematology ROS (+)   Anesthesia Other Findings   Reproductive/Obstetrics                           Anesthesia Physical Anesthesia Plan  ASA: 3  Anesthesia Plan: General   Post-op Pain Management: Tylenol PO (pre-op)*   Induction: Intravenous  PONV Risk Score and Plan: 3 and Ondansetron, Dexamethasone and Scopolamine patch - Pre-op  Airway Management Planned: Oral ETT and Video Laryngoscope Planned  Additional Equipment: None  Intra-op Plan:   Post-operative Plan: Extubation in OR  Informed Consent: I have reviewed the patients History and Physical, chart, labs and discussed the procedure including the risks, benefits and alternatives for the proposed anesthesia with the  patient or authorized representative who has indicated his/her understanding and acceptance.     Dental advisory given  Plan Discussed with: CRNA and Surgeon  Anesthesia Plan Comments: (PAT note written 10/22/2021 by Myra Gianotti, PA-C. )      Anesthesia Quick Evaluation

## 2021-10-22 NOTE — Progress Notes (Signed)
Anesthesia Chart Review:  Case: [id number] Date/Time: 10/24/21 1045   Procedure: ANTERIOR CERVICAL DECOMPRESSION FUSION CERVICAL 4- CERVICAL 5, CERVICAL 5- CERVICAL 6, CERVICAL 6- CERVICAL 7 WITH INSTRUMENTATION AND ALLOGRAFT   Anesthesia type: General   Pre-op diagnosis: Ongoing left arm pain and weakness, with an MRI notable for prominent spinal stenosis and spinal cord compression involving C4-5, C5-6, and C6-7   Location: [facility] OR ROOM 05 / [place]   Surgeons: Phylliss Bob, MD       DISCUSSION: Patient is a 66 year old female scheduled for the above procedure.  History includes never smoker, HTN, PAF (02/21/16), OSA (uses CPAP), pre-diabetes, GERD, hiatal hernia, fatty liver, pseudotumor cerebri (2007), hysterectomy (06/18/17). BMI is consistent with morbid obesity.    She had a telephonic preoperative cardiac risk evaluation by Diona Browner, NP on 10/18/21. She wrote, "Preoperative Cardiovascular Risk Assessment:   According to the Revised Cardiac Risk Index (RCRI), her Perioperative Risk of Major Cardiac Event is (%): 0.4. Her Functional Capacity in METs is: 7.99 according to the Duke Activity Status Index (DASI). Therefore, based on ACC/AHA guidelines, patient would be at acceptable risk for the planned procedure without further cardiovascular testing..."   She last saw internist Cathlean Cower, MD on 10/15/21 for follow-up recent sinusitis and tonsillar enlargement that had been initially treated with doxycycline and later with 5 days course of prednisone and azithromycin. CXR without PNA. No further wheezing but with persistent left tonsillar enlargement, possibly now chronic. He gave another course of doxycycline, but if persistent would refer her to ENT in the future. He also gave her Nystatin for oral thrush and DepoMedrol injection for allergic rhinitis. He noted upcoming  surgery for left cervical radiculopathy, and wrote, "ok for surgury from my standpoint".  Reported last aspirin 10/15/2021.   Anesthesia team to evaluate on the day of surgery.   VS: BP 128/63   Pulse (!) 55   Temp 36.7 C (Oral)   Resp 17   Ht '5\' 6"'$  (1.676 m)   Wt 112.5 kg   SpO2 99%   BMI 40.04 kg/m    PROVIDERS: [name], MD is PCP  Minus Breeding, MD is cardiologist. Last visit 09/09/21.  She was noted to have new onset A-fib during 02/21/2016 ED visit for palpitations.  She spontaneously converted to sinus rhythm.  She was placed on Xarelto, but this was eventually discontinued because she had no recurrence.  Also known bradycardia without syncope or presyncope.  Continue to monitor.  Continue wrist reduction for aortic atherosclerosis, negative ETT in 2017.  Continue antihypertensives and CPAP.  As needed cardiology follow-up recommended.   LABS: Labs reviewed: Acceptable for surgery. A1c 6.1% on 04/01/21.  (all labs ordered are listed, but only abnormal results are displayed)  Labs Reviewed  BASIC METABOLIC PANEL - Abnormal; Notable for the following components:      Result Value   Glucose, Bld 126 (*)    All other components within normal limits  SURGICAL PCR SCREEN  CBC  TYPE AND SCREEN     IMAGES: CXR 10/08/21: FINDINGS: Normal heart size and mediastinal contours. No acute infiltrate or edema. No effusion or pneumothorax. No acute osseous findings. Degenerative thoracic endplate spurring. IMPRESSION: No evidence of active disease.   MRI C-spine 09/30/21: IMPRESSION: 1. At C3-4 there is a broad-based disc bulge. Left uncovertebral degenerative changes. Moderate left foraminal stenosis. 2. At C4-5 there is a broad-based disc bulge. Bilateral uncovertebral degenerative changes. Severe bilateral foraminal stenosis. Mild spinal stenosis. 3. At  C5-6 there is a broad-based disc osteophyte complex with a broad central disc osteophyte complex flattening the ventral cervical spinal cord. Severe spinal stenosis. 4. At C6-7 there is a broad-based disc bulge with a central  disc protrusion with mass effect on the cervical spinal cord. Moderate spinal stenosis. Moderate right and mild-moderate left foraminal stenosis.   MRI Chest 07/09/21: IMPRESSION: 1. Lipomatous prominence along the posterior aspect of the left chest wall extending from the level of the shoulder to the left flank most consistent with prominent subcutaneous fat. No encapsulated soft tissue mass.   EKG: 09/09/21: SB at 52 bpm   CV: US Carotid 10/03/20: Summary:  - Right Carotid: There is no evidence of stenosis in the right ICA.  - Left Carotid: There is no evidence of stenosis in the left ICA.  - Vertebrals:  Bilateral vertebral arteries demonstrate antegrade flow.  - Subclavians: Normal flow hemodynamics were seen in bilateral subclavian               arteries.    Echo 09/08/19: IMPRESSIONS   1. Left ventricular ejection fraction, by estimation, is 60 to 65%. The  left ventricle has normal function. The left ventricle has no regional  wall motion abnormalities. There is mild left ventricular hypertrophy.  Left ventricular diastolic parameters  are consistent with Grade I diastolic dysfunction (impaired relaxation).  The average left ventricular global longitudinal strain is -21.7 %. The  global longitudinal strain is normal.   2. Right ventricular systolic function is normal. The right ventricular  size is normal. There is normal pulmonary artery systolic pressure.   3. The mitral valve is abnormal. Trivial mitral valve regurgitation.   4. The aortic valve is tricuspid. Aortic valve regurgitation is not  visualized.   5. The inferior vena cava is normal in size with greater than 50%  respiratory variability, suggesting right atrial pressure of 3 mmHg.    ETT 06/13/15: There was no ST segment deviation noted during stress.     Past Medical History:  Diagnosis Date   Allergic rhinitis    Anticoagulant long-term use    XARELTO   ANXIETY    Dysrhythmia    Endometrial polyp     Endometrial polyp    Fatty liver 06/23/2007   GERD    Hiatal hernia    History of esophageal stricture    mainly due to gerd   History of exercise stress test 06/13/2015   PER DR HOCHREIN NOTE -- NO EVIDENCE ISCHEMIA   HYPERTENSION    Impaired fasting glucose    Migraine    hx of migraines none recent   OSA on CPAP followed by dr Halford Chessman   per study 02-09-2012  severe osa w/ AHI 33.8   Paroxysmal atrial fibrillation ([place]) cardioloigst-  dr hochrein   dx 02-21-2016 at ED   Pneumonia    with COVID   Pre-diabetes    Pseudotumor cerebri 2007   Sleep apnea    Vitamin D deficiency 07/2013    Past Surgical History:  Procedure Laterality Date   ABDOMINAL HYSTERECTOMY     BREAST BIOPSY Right 08/12/2019    FIBROADENOMA    COLONOSCOPY     colonscopy  2007   normal   DILATATION & CURETTAGE/HYSTEROSCOPY WITH MYOSURE N/A 03/26/2017   Procedure: DILATATION & CURETTAGE/HYSTEROSCOPY WITH MYOSURE;  Surgeon: Paula Compton, MD;  Location: [place];  Service: Gynecology;  Laterality: N/A;   DILATION AND CURETTAGE OF UTERUS  2010   FRACTURE SURGERY  LAPAROSCOPIC HYSTERECTOMY Bilateral 06/18/2017   Procedure: HYSTERECTOMY TOTAL LAPAROSCOPIC;  Surgeon: Paula Compton, MD;  Location: [facility];  Service: Gynecology;  Laterality: Bilateral;  OUT PT IN BED, needs extra time MD request 2.5hrs in OR   right knee arthroscopy  03/2013   TRANSTHORACIC ECHOCARDIOGRAM  03/11/2016   ef 60-65%/  mild MR/ trivial PR and TR   TUBAL LIGATION     UPPER GASTROINTESTINAL ENDOSCOPY  11/2020    MEDICATIONS:  amLODipine (NORVASC) 5 MG tablet   aspirin EC 81 MG tablet   Cholecalciferol (VITAMIN D) 50 MCG (2000 UT) tablet   doxycycline (VIBRA-TABS) 100 MG tablet   fluticasone (FLONASE) 50 MCG/ACT nasal spray   loratadine (CLARITIN) 10 MG tablet   losartan (COZAAR) 100 MG tablet   Multiple Vitamins-Minerals (CENTRUM SILVER 50+WOMEN) TABS   nystatin (MYCOSTATIN)  100000 UNIT/ML suspension   pantoprazole (PROTONIX) 40 MG tablet   propranolol (INDERAL) 20 MG tablet   No current facility-administered medications for this encounter.    Myra Gianotti, PA-C Surgical Short Stay/Anesthesiology [facility] Phone [phone] [facility] Phone [phone] 10/22/2021 1:18 PM

## 2021-10-24 ENCOUNTER — Encounter (HOSPITAL_COMMUNITY): Payer: Self-pay | Admitting: Orthopedic Surgery

## 2021-10-24 ENCOUNTER — Inpatient Hospital Stay (HOSPITAL_COMMUNITY): Payer: Managed Care, Other (non HMO)

## 2021-10-24 ENCOUNTER — Inpatient Hospital Stay (HOSPITAL_COMMUNITY)
Admission: RE | Admit: 2021-10-24 | Discharge: 2021-10-24 | DRG: 472 | Disposition: A | Discharge status: Home / Self Care | Payer: Managed Care, Other (non HMO) | Attending: Orthopedic Surgery | Admitting: Orthopedic Surgery

## 2021-10-24 ENCOUNTER — Other Ambulatory Visit: Payer: Self-pay

## 2021-10-24 ENCOUNTER — Inpatient Hospital Stay (HOSPITAL_COMMUNITY): Payer: Managed Care, Other (non HMO) | Admitting: Vascular Surgery

## 2021-10-24 ENCOUNTER — Inpatient Hospital Stay (HOSPITAL_COMMUNITY)
Admission: RE | Disposition: A | Discharge status: Home / Self Care | Payer: Self-pay | Source: Home / Self Care | Attending: Orthopedic Surgery

## 2021-10-24 ENCOUNTER — Inpatient Hospital Stay (HOSPITAL_COMMUNITY): Payer: Managed Care, Other (non HMO) | Admitting: Anesthesiology

## 2021-10-24 DIAGNOSIS — Z7901 Long term (current) use of anticoagulants: Secondary | ICD-10-CM | POA: Diagnosis not present

## 2021-10-24 DIAGNOSIS — J309 Allergic rhinitis, unspecified: Secondary | ICD-10-CM | POA: Diagnosis present

## 2021-10-24 DIAGNOSIS — Z833 Family history of diabetes mellitus: Secondary | ICD-10-CM

## 2021-10-24 DIAGNOSIS — Z885 Allergy status to narcotic agent status: Secondary | ICD-10-CM | POA: Diagnosis not present

## 2021-10-24 DIAGNOSIS — Z880 Allergy status to penicillin: Secondary | ICD-10-CM

## 2021-10-24 DIAGNOSIS — I10 Essential (primary) hypertension: Secondary | ICD-10-CM | POA: Diagnosis present

## 2021-10-24 DIAGNOSIS — M5412 Radiculopathy, cervical region: Secondary | ICD-10-CM | POA: Diagnosis present

## 2021-10-24 DIAGNOSIS — J351 Hypertrophy of tonsils: Secondary | ICD-10-CM | POA: Diagnosis present

## 2021-10-24 DIAGNOSIS — Z79899 Other long term (current) drug therapy: Secondary | ICD-10-CM | POA: Diagnosis not present

## 2021-10-24 DIAGNOSIS — G4733 Obstructive sleep apnea (adult) (pediatric): Secondary | ICD-10-CM | POA: Diagnosis present

## 2021-10-24 DIAGNOSIS — R7303 Prediabetes: Secondary | ICD-10-CM | POA: Diagnosis present

## 2021-10-24 DIAGNOSIS — G549 Nerve root and plexus disorder, unspecified: Secondary | ICD-10-CM | POA: Diagnosis present

## 2021-10-24 DIAGNOSIS — Z981 Arthrodesis status: Secondary | ICD-10-CM

## 2021-10-24 DIAGNOSIS — Z01818 Encounter for other preprocedural examination: Secondary | ICD-10-CM

## 2021-10-24 DIAGNOSIS — Z8249 Family history of ischemic heart disease and other diseases of the circulatory system: Secondary | ICD-10-CM | POA: Diagnosis not present

## 2021-10-24 DIAGNOSIS — F419 Anxiety disorder, unspecified: Secondary | ICD-10-CM

## 2021-10-24 DIAGNOSIS — M4802 Spinal stenosis, cervical region: Secondary | ICD-10-CM

## 2021-10-24 DIAGNOSIS — Z888 Allergy status to other drugs, medicaments and biological substances status: Secondary | ICD-10-CM

## 2021-10-24 DIAGNOSIS — K219 Gastro-esophageal reflux disease without esophagitis: Secondary | ICD-10-CM | POA: Diagnosis present

## 2021-10-24 DIAGNOSIS — Z7982 Long term (current) use of aspirin: Secondary | ICD-10-CM

## 2021-10-24 DIAGNOSIS — Z8616 Personal history of COVID-19: Secondary | ICD-10-CM | POA: Diagnosis not present

## 2021-10-24 DIAGNOSIS — Z882 Allergy status to sulfonamides status: Secondary | ICD-10-CM | POA: Diagnosis not present

## 2021-10-24 DIAGNOSIS — K760 Fatty (change of) liver, not elsewhere classified: Secondary | ICD-10-CM | POA: Diagnosis present

## 2021-10-24 DIAGNOSIS — I480 Paroxysmal atrial fibrillation: Secondary | ICD-10-CM | POA: Diagnosis present

## 2021-10-24 DIAGNOSIS — M5020 Other cervical disc displacement, unspecified cervical region: Secondary | ICD-10-CM

## 2021-10-24 DIAGNOSIS — Z881 Allergy status to other antibiotic agents status: Secondary | ICD-10-CM | POA: Diagnosis not present

## 2021-10-24 DIAGNOSIS — Z6839 Body mass index (BMI) 39.0-39.9, adult: Secondary | ICD-10-CM | POA: Diagnosis not present

## 2021-10-24 DIAGNOSIS — G992 Myelopathy in diseases classified elsewhere: Secondary | ICD-10-CM | POA: Diagnosis present

## 2021-10-24 HISTORY — PX: ANTERIOR CERVICAL DECOMPRESSION/DISCECTOMY FUSION 4 LEVELS: SHX5556

## 2021-10-24 HISTORY — DX: Other cervical disc displacement, unspecified cervical region: M50.20

## 2021-10-24 HISTORY — DX: Arthrodesis status: Z98.1

## 2021-10-24 SURGERY — ANTERIOR CERVICAL DECOMPRESSION/DISCECTOMY FUSION 4 LEVELS
Anesthesia: General | Site: Spine Cervical

## 2021-10-24 MED ORDER — METHOCARBAMOL 750 MG PO TABS: 750.0000 mg | ORAL_TABLET | Freq: Four times a day (QID) | ORAL | 0 refills | Status: DC | PRN

## 2021-10-24 MED ORDER — MIDAZOLAM HCL 2 MG/2ML IJ SOLN
INTRAMUSCULAR | Status: AC
  Filled 2021-10-24: qty 2

## 2021-10-24 MED ORDER — OXYCODONE HCL 5 MG PO TABS
ORAL_TABLET | ORAL | Status: AC
  Filled 2021-10-24: qty 1

## 2021-10-24 MED ORDER — LACTATED RINGERS IV SOLN: INTRAVENOUS | Status: DC

## 2021-10-24 MED ORDER — PROMETHAZINE HCL 25 MG/ML IJ SOLN
INTRAMUSCULAR | Status: DC
Start: 2021-10-24 — End: 2021-10-24
  Filled 2021-10-24: qty 1

## 2021-10-24 MED ORDER — DEXAMETHASONE SODIUM PHOSPHATE 10 MG/ML IJ SOLN
INTRAMUSCULAR | Status: AC
  Filled 2021-10-24: qty 1

## 2021-10-24 MED ORDER — LIDOCAINE HCL (CARDIAC) PF 100 MG/5ML IV SOSY
PREFILLED_SYRINGE | INTRAVENOUS | Status: DC | PRN
  Administered 2021-10-24 (×2): 50 mg via INTRAVENOUS

## 2021-10-24 MED ORDER — POVIDONE-IODINE 7.5 % EX SOLN
Freq: Once | CUTANEOUS | Status: DC
  Filled 2021-10-24: qty 118

## 2021-10-24 MED ORDER — VANCOMYCIN HCL 1500 MG/300ML IV SOLN
1500.0000 mg | INTRAVENOUS | Status: AC
  Administered 2021-10-24: 1500 mg via INTRAVENOUS
  Filled 2021-10-24: qty 300

## 2021-10-24 MED ORDER — OXYCODONE HCL 5 MG/5ML PO SOLN: 5.0000 mg | Freq: Once | ORAL | Status: AC | PRN

## 2021-10-24 MED ORDER — LACTATED RINGERS IV SOLN: INTRAVENOUS | Status: DC | PRN

## 2021-10-24 MED ORDER — PROPOFOL 10 MG/ML IV BOLUS
INTRAVENOUS | Status: DC | PRN
  Administered 2021-10-24 (×2): 100 mg via INTRAVENOUS

## 2021-10-24 MED ORDER — LIDOCAINE 2% (20 MG/ML) 5 ML SYRINGE
INTRAMUSCULAR | Status: AC
  Filled 2021-10-24: qty 5

## 2021-10-24 MED ORDER — PHENYLEPHRINE HCL (PRESSORS) 10 MG/ML IV SOLN
INTRAVENOUS | Status: DC | PRN
  Administered 2021-10-24: 80 ug via INTRAVENOUS

## 2021-10-24 MED ORDER — OXYCODONE HCL 5 MG PO TABS
5.0000 mg | ORAL_TABLET | Freq: Once | ORAL | Status: AC | PRN
  Administered 2021-10-24: 5 mg via ORAL

## 2021-10-24 MED ORDER — MIDAZOLAM HCL 2 MG/2ML IJ SOLN: 0.5000 mg | Freq: Once | INTRAMUSCULAR | Status: DC | PRN

## 2021-10-24 MED ORDER — DEXAMETHASONE SODIUM PHOSPHATE 10 MG/ML IJ SOLN
INTRAMUSCULAR | Status: DC | PRN
  Administered 2021-10-24: 10 mg via INTRAVENOUS

## 2021-10-24 MED ORDER — SUGAMMADEX SODIUM 200 MG/2ML IV SOLN
INTRAVENOUS | Status: DC | PRN
  Administered 2021-10-24: 200 mg via INTRAVENOUS

## 2021-10-24 MED ORDER — THROMBIN 20000 UNITS EX KIT
PACK | CUTANEOUS | Status: DC | PRN
  Administered 2021-10-24: 20 mL via TOPICAL

## 2021-10-24 MED ORDER — ONDANSETRON HCL 4 MG PO TABS: 4.0000 mg | ORAL_TABLET | Freq: Three times a day (TID) | ORAL | 0 refills | Status: DC | PRN

## 2021-10-24 MED ORDER — FENTANYL CITRATE (PF) 250 MCG/5ML IJ SOLN
INTRAMUSCULAR | Status: AC
  Filled 2021-10-24: qty 5

## 2021-10-24 MED ORDER — HYDROCODONE-ACETAMINOPHEN 5-325 MG PO TABS: 1.0000 | ORAL_TABLET | Freq: Four times a day (QID) | ORAL | 0 refills | Status: DC | PRN

## 2021-10-24 MED ORDER — PROPOFOL 10 MG/ML IV BOLUS
INTRAVENOUS | Status: AC
  Filled 2021-10-24: qty 20

## 2021-10-24 MED ORDER — ONDANSETRON HCL 4 MG/2ML IJ SOLN
INTRAMUSCULAR | Status: AC
  Filled 2021-10-24: qty 2

## 2021-10-24 MED ORDER — BUPIVACAINE-EPINEPHRINE 0.25% -1:200000 IJ SOLN
INTRAMUSCULAR | Status: DC | PRN
  Administered 2021-10-24: 5 mL

## 2021-10-24 MED ORDER — PHENYLEPHRINE HCL-NACL 20-0.9 MG/250ML-% IV SOLN
INTRAVENOUS | Status: DC | PRN
  Administered 2021-10-24: 25 ug/min via INTRAVENOUS

## 2021-10-24 MED ORDER — HYDROMORPHONE HCL 1 MG/ML IJ SOLN
0.2500 mg | INTRAMUSCULAR | Status: DC | PRN
  Administered 2021-10-24: 0.5 mg via INTRAVENOUS

## 2021-10-24 MED ORDER — ROCURONIUM BROMIDE 10 MG/ML (PF) SYRINGE
PREFILLED_SYRINGE | INTRAVENOUS | Status: AC
  Filled 2021-10-24: qty 20

## 2021-10-24 MED ORDER — ORAL CARE MOUTH RINSE: 15.0000 mL | Freq: Once | OROMUCOSAL | Status: AC

## 2021-10-24 MED ORDER — BUPIVACAINE-EPINEPHRINE (PF) 0.25% -1:200000 IJ SOLN
INTRAMUSCULAR | Status: AC
  Filled 2021-10-24: qty 30

## 2021-10-24 MED ORDER — MEPERIDINE HCL 25 MG/ML IJ SOLN: 6.2500 mg | INTRAMUSCULAR | Status: DC | PRN

## 2021-10-24 MED ORDER — 0.9 % SODIUM CHLORIDE (POUR BTL) OPTIME
TOPICAL | Status: DC | PRN
  Administered 2021-10-24: 1000 mL

## 2021-10-24 MED ORDER — ROCURONIUM 10MG/ML (10ML) SYRINGE FOR MEDFUSION PUMP - OPTIME
INTRAVENOUS | Status: DC | PRN
  Administered 2021-10-24 (×2): 100 mg via INTRAVENOUS

## 2021-10-24 MED ORDER — MIDAZOLAM HCL 2 MG/2ML IJ SOLN
INTRAMUSCULAR | Status: DC | PRN
  Administered 2021-10-24: 2 mg via INTRAVENOUS

## 2021-10-24 MED ORDER — FENTANYL CITRATE (PF) 250 MCG/5ML IJ SOLN
INTRAMUSCULAR | Status: DC | PRN
  Administered 2021-10-24: 50 ug via INTRAVENOUS
  Administered 2021-10-24 (×2): 100 ug via INTRAVENOUS
  Administered 2021-10-24 (×2): 50 ug via INTRAVENOUS

## 2021-10-24 MED ORDER — THROMBIN 20000 UNITS EX SOLR
CUTANEOUS | Status: AC
Start: 2021-10-24 — End: ?
  Filled 2021-10-24: qty 20000

## 2021-10-24 MED ORDER — CHLORHEXIDINE GLUCONATE 0.12 % MT SOLN
15.0000 mL | Freq: Once | OROMUCOSAL | Status: AC
  Administered 2021-10-24: 15 mL via OROMUCOSAL
  Filled 2021-10-24: qty 15

## 2021-10-24 MED ORDER — ONDANSETRON HCL 4 MG/2ML IJ SOLN
INTRAMUSCULAR | Status: DC | PRN
  Administered 2021-10-24: 4 mg via INTRAVENOUS

## 2021-10-24 MED ORDER — PROMETHAZINE HCL 25 MG/ML IJ SOLN
6.2500 mg | INTRAMUSCULAR | Status: DC | PRN
  Administered 2021-10-24: 12.5 mg via INTRAVENOUS

## 2021-10-24 MED ORDER — SCOPOLAMINE 1 MG/3DAYS TD PT72: 1.0000 | MEDICATED_PATCH | TRANSDERMAL | Status: DC

## 2021-10-24 MED ORDER — ACETAMINOPHEN 500 MG PO TABS: 1000.0000 mg | ORAL_TABLET | Freq: Once | ORAL | Status: DC

## 2021-10-24 MED ORDER — HYDROMORPHONE HCL 1 MG/ML IJ SOLN
INTRAMUSCULAR | Status: AC
  Filled 2021-10-24: qty 1

## 2021-10-24 SURGICAL SUPPLY — 79 items
BAG COUNTER SPONGE SURGICOUNT (BAG) ×2 IMPLANT
BENZOIN TINCTURE PRP APPL 2/3 (GAUZE/BANDAGES/DRESSINGS) ×2 IMPLANT
BIT DRILL NEURO 2X3.1 SFT TUCH (MISCELLANEOUS) ×1 IMPLANT
BIT DRILL SRG 14X2.2XFLT CHK (BIT) IMPLANT
BIT DRL SRG 14X2.2XFLT CHK (BIT) ×1
BLADE CLIPPER SURG (BLADE) ×1 IMPLANT
BLADE SURG 15 STRL LF DISP TIS (BLADE) ×1 IMPLANT
BLADE SURG 15 STRL SS (BLADE) ×2
BONE VIVIGEN FORMABLE 1.3CC (Bone Implant) ×4 IMPLANT
BUR MATCHSTICK NEURO 3.0 LAGG (BURR) IMPLANT
CARTRIDGE OIL MAESTRO DRILL (MISCELLANEOUS) ×1 IMPLANT
COVER SURGICAL LIGHT HANDLE (MISCELLANEOUS) ×2 IMPLANT
DIFFUSER DRILL AIR PNEUMATIC (MISCELLANEOUS) ×2 IMPLANT
DRAIN JACKSON RD 7FR 3/32 (WOUND CARE) IMPLANT
DRAPE C-ARM 42X72 X-RAY (DRAPES) ×2 IMPLANT
DRAPE POUCH INSTRU U-SHP 10X18 (DRAPES) ×2 IMPLANT
DRAPE SURG 17X23 STRL (DRAPES) ×4 IMPLANT
DRILL BIT SKYLINE 14MM (BIT) ×2
DRILL NEURO 2X3.1 SOFT TOUCH (MISCELLANEOUS) ×2
DURAPREP 26ML APPLICATOR (WOUND CARE) ×2 IMPLANT
ELECT COATED BLADE 2.86 ST (ELECTRODE) ×2 IMPLANT
ELECT REM PT RETURN 9FT ADLT (ELECTROSURGICAL) ×2
ELECTRODE REM PT RTRN 9FT ADLT (ELECTROSURGICAL) ×1 IMPLANT
EVACUATOR SILICONE 100CC (DRAIN) IMPLANT
GAUZE 4X4 16PLY ~~LOC~~+RFID DBL (SPONGE) ×1 IMPLANT
GAUZE SPONGE 4X4 12PLY STRL (GAUZE/BANDAGES/DRESSINGS) ×2 IMPLANT
GLOVE BIO SURGEON STRL SZ7 (GLOVE) ×2 IMPLANT
GLOVE BIO SURGEON STRL SZ8 (GLOVE) ×2 IMPLANT
GLOVE BIOGEL PI IND STRL 7.0 (GLOVE) ×2 IMPLANT
GLOVE BIOGEL PI IND STRL 8 (GLOVE) ×1 IMPLANT
GLOVE BIOGEL PI INDICATOR 7.0 (GLOVE) ×2
GLOVE BIOGEL PI INDICATOR 8 (GLOVE) ×1
GLOVE SURG ENC MOIS LTX SZ6.5 (GLOVE) ×2 IMPLANT
GOWN STRL REUS W/ TWL LRG LVL3 (GOWN DISPOSABLE) ×1 IMPLANT
GOWN STRL REUS W/ TWL XL LVL3 (GOWN DISPOSABLE) ×1 IMPLANT
GOWN STRL REUS W/TWL LRG LVL3 (GOWN DISPOSABLE) ×2
GOWN STRL REUS W/TWL XL LVL3 (GOWN DISPOSABLE) ×2
GRAFT BNE MATRIX VG FRMBL SM 1 (Bone Implant) IMPLANT
INTERLOCK LRDTC CRVCL VBR 6MM (Bone Implant) IMPLANT
INTERLOCK LRDTC CRVCL VBR SM (Bone Implant) IMPLANT
IV CATH 14GX2 1/4 (CATHETERS) ×2 IMPLANT
KIT BASIN OR (CUSTOM PROCEDURE TRAY) ×2 IMPLANT
KIT TURNOVER KIT B (KITS) ×2 IMPLANT
LORDOTIC CERVICAL VBR 6MM SM (Bone Implant) ×4 IMPLANT
LORDOTIC CERVICAL VBR SM 5MM (Bone Implant) ×2 IMPLANT
MANIFOLD NEPTUNE II (INSTRUMENTS) ×1 IMPLANT
NDL PRECISIONGLIDE 27X1.5 (NEEDLE) ×1 IMPLANT
NDL SPNL 20GX3.5 QUINCKE YW (NEEDLE) ×1 IMPLANT
NEEDLE PRECISIONGLIDE 27X1.5 (NEEDLE) ×2 IMPLANT
NEEDLE SPNL 20GX3.5 QUINCKE YW (NEEDLE) ×2 IMPLANT
NS IRRIG 1000ML POUR BTL (IV SOLUTION) ×2 IMPLANT
OIL CARTRIDGE MAESTRO DRILL (MISCELLANEOUS) ×2
PACK ORTHO CERVICAL (CUSTOM PROCEDURE TRAY) ×2 IMPLANT
PAD ARMBOARD 7.5X6 YLW CONV (MISCELLANEOUS) ×4 IMPLANT
PATTIES SURGICAL .5 X.5 (GAUZE/BANDAGES/DRESSINGS) IMPLANT
PATTIES SURGICAL .5 X1 (DISPOSABLE) ×1 IMPLANT
PIN DISTRACTION 14 (PIN) ×2 IMPLANT
PLATE SKYLINE 3LVL 45MM CERV (Plate) ×1 IMPLANT
POSITIONER HEAD DONUT 9IN (MISCELLANEOUS) ×2 IMPLANT
SCREW SKYLINE VAR OS 14MM (Screw) ×8 IMPLANT
SOL ANTI FOG 6CC (MISCELLANEOUS) IMPLANT
SOLUTION ANTI FOG 6CC (MISCELLANEOUS) ×1
SPIKE FLUID TRANSFER (MISCELLANEOUS) ×2 IMPLANT
SPONGE INTESTINAL PEANUT (DISPOSABLE) ×3 IMPLANT
SPONGE SURGIFOAM ABS GEL 100 (HEMOSTASIS) ×2 IMPLANT
STRIP CLOSURE SKIN 1/2X4 (GAUZE/BANDAGES/DRESSINGS) ×2 IMPLANT
SURGIFLO W/THROMBIN 8M KIT (HEMOSTASIS) IMPLANT
SUT BONE WAX W31G (SUTURE) ×1 IMPLANT
SUT MNCRL AB 4-0 PS2 18 (SUTURE) ×2 IMPLANT
SUT VIC AB 2-0 CT2 18 VCP726D (SUTURE) ×2 IMPLANT
SYR BULB IRRIG 60ML STRL (SYRINGE) ×2 IMPLANT
SYR CONTROL 10ML LL (SYRINGE) ×4 IMPLANT
TAPE CLOTH 4X10 WHT NS (GAUZE/BANDAGES/DRESSINGS) ×2 IMPLANT
TAPE UMBILICAL COTTON 1/8X30 (MISCELLANEOUS) ×2 IMPLANT
TOWEL GREEN STERILE (TOWEL DISPOSABLE) ×2 IMPLANT
TOWEL GREEN STERILE FF (TOWEL DISPOSABLE) ×2 IMPLANT
TRAY FOLEY MTR SLVR 16FR STAT (SET/KITS/TRAYS/PACK) ×1 IMPLANT
WATER STERILE IRR 1000ML POUR (IV SOLUTION) ×1 IMPLANT
YANKAUER SUCT BULB TIP NO VENT (SUCTIONS) ×2 IMPLANT

## 2021-10-24 NOTE — Anesthesia Procedure Notes (Signed)
Procedure Name: Intubation Date/Time: 10/24/2021 10:50 AM  Performed by: Claris Che, CRNAPre-anesthesia Checklist: Patient identified, Emergency Drugs available, Suction available, Patient being monitored and Timeout performed Patient Re-evaluated:Patient Re-evaluated prior to induction Oxygen Delivery Method: Circle system utilized Preoxygenation: Pre-oxygenation with 100% oxygen Induction Type: IV induction and Cricoid Pressure applied Ventilation: Mask ventilation without difficulty Laryngoscope Size: Glidescope and 4 Grade View: Grade II Tube type: Oral Tube size: 7.5 mm Number of attempts: 1 Airway Equipment and Method: Rigid stylet Placement Confirmation: ETT inserted through vocal cords under direct vision, positive ETCO2 and breath sounds checked- equal and bilateral Secured at: 24 cm Tube secured with: Tape Dental Injury: Teeth and Oropharynx as per pre-operative assessment

## 2021-10-24 NOTE — Anesthesia Postprocedure Evaluation (Signed)
Anesthesia Post Note  Patient: Shannon Navarro  Procedure(s) Performed: ANTERIOR CERVICAL DECOMPRESSION FUSION CERVICAL FOUR THROUGH CERVICAL FIVE, CERVICAL FIVE THROUGH CERVICAL SIX, CERVICAL SIX THROUGH CERVICAL SEVEN WITH INSTRUMENTATION AND ALLOGRAFT (Spine Cervical)     Patient location during evaluation: PACU Anesthesia Type: General Level of consciousness: awake and alert, patient cooperative and oriented Pain management: pain level controlled Vital Signs Assessment: post-procedure vital signs reviewed and stable Respiratory status: spontaneous breathing, nonlabored ventilation, respiratory function stable and patient connected to nasal cannula oxygen Cardiovascular status: blood pressure returned to baseline and stable Postop Assessment: no apparent nausea or vomiting Anesthetic complications: no   No notable events documented.  Last Vitals:  Vitals:   10/24/21 1338 10/24/21 1348  BP:    Pulse:  83  Resp:  13  Temp: 36.8 C   SpO2:  94%    Last Pain:  Vitals:   10/24/21 1348  PainSc: 8                  Dsean Vantol,E. Shali Vesey

## 2021-10-24 NOTE — H&P (Signed)
PREOPERATIVE H&P  Chief Complaint: Left arm pain  HPI: Shannon Navarro is a 66 y.o. female who presents with ongoing pain in the left arm  MRI reveals cord compression and NF stenosis spanning C4-C7  Patient has failed multiple forms of conservative care and continues to have pain (see office notes for additional details regarding the patient's full course of treatment)  Past Medical History:  Diagnosis Date   Allergic rhinitis    Anticoagulant long-term use    XARELTO   ANXIETY    Dysrhythmia    Endometrial polyp    Endometrial polyp    Fatty liver 06/23/2007   GERD    Hiatal hernia    History of esophageal stricture    mainly due to gerd   History of exercise stress test 06/13/2015   PER DR HOCHREIN NOTE -- NO EVIDENCE ISCHEMIA   HYPERTENSION    Impaired fasting glucose    Migraine    hx of migraines none recent   OSA on CPAP followed by dr Halford Chessman   per study 02-09-2012  severe osa w/ AHI 33.8   Paroxysmal atrial fibrillation ([place]) cardioloigst-  dr hochrein   dx 02-21-2016 at ED   Pneumonia    with COVID   Pre-diabetes    Pseudotumor cerebri 2007   Sleep apnea    Vitamin D deficiency 07/2013   Past Surgical History:  Procedure Laterality Date   ABDOMINAL HYSTERECTOMY     BREAST BIOPSY Right 08/12/2019    FIBROADENOMA    COLONOSCOPY     colonscopy  2007   normal   DILATATION & CURETTAGE/HYSTEROSCOPY WITH MYOSURE N/A 03/26/2017   Procedure: DILATATION & CURETTAGE/HYSTEROSCOPY WITH MYOSURE;  Surgeon: Paula Compton, MD;  Location: [place];  Service: Gynecology;  Laterality: N/A;   DILATION AND CURETTAGE OF UTERUS  2010   FRACTURE SURGERY     LAPAROSCOPIC HYSTERECTOMY Bilateral 06/18/2017   Procedure: HYSTERECTOMY TOTAL LAPAROSCOPIC;  Surgeon: Paula Compton, MD;  Location: [place];  Service: Gynecology;  Laterality: Bilateral;  OUT PT IN BED, needs extra time MD request 2.5hrs in OR   right knee arthroscopy   03/2013   TRANSTHORACIC ECHOCARDIOGRAM  03/11/2016   ef 60-65%/  mild MR/ trivial PR and TR   TUBAL LIGATION     UPPER GASTROINTESTINAL ENDOSCOPY  11/2020   Social History   Socioeconomic History   Marital status: Married    Spouse name: Not on file   Number of children: 2   Years of education: Not on file   Highest education level: Not on file  Occupational History   Occupation: Retired from [employer]: [place]: from [place], currently works [employer] in [place] Use   Smoking status: Never   Smokeless tobacco: Never   Tobacco comments:    Married lives with spouse + 2 daughters  Vaping Use   Vaping Use: Never used  Substance and Sexual Activity   Alcohol use: No   Drug use: No   Sexual activity: Not on file  Other Topics Concern   Not on file  Social History Narrative   Occupation: Management, social services. Married, lives with husband. Has 2 daughter 57 - 54   Social Determinants of Radio broadcast assistant Strain: Not on [employer] Insecurity: Not on file  Transportation Needs: Not on file  Physical Activity: Not on file  Stress: Not on file  Social Connections: Not on file   Family History  Problem Relation Age of Onset   Breast cancer Mother        11   Asthma Mother    Hypertension Mother    Prostate cancer Father    Aortic stenosis Father    Hypertension Father    Colon polyps Father    Heart disease Father    Diabetes Father    Breast cancer Maternal Aunt    Breast cancer Maternal Aunt    Esophageal cancer Other    Stomach cancer Other    Colon cancer Other    Breast cancer Niece        15s   Rectal cancer Neg Hx    Allergies  Allergen Reactions   Cefaclor Rash   Codeine Nausea And Vomiting   Hydrocodone Nausea And Vomiting    Severe vomiting/  COUGH SYRUP CAUSE SEVERE VOMITING   Levaquin [Levofloxacin] Nausea Only   Nexium [Esomeprazole Magnesium] Other (See Comments)    Made legs  ache   Penicillins Rash   Sulfa Antibiotics Rash   Prior to Admission medications   Medication Sig Start Date End Date Taking? Authorizing Provider  amLODipine (NORVASC) 5 MG tablet Take 1 tablet (5 mg total) by mouth daily. 10/02/21  Yes Minus Breeding, MD  Cholecalciferol (VITAMIN D) 50 MCG (2000 UT) tablet Take 2,000 Units by mouth daily.   Yes [provider]  doxycycline (VIBRA-TABS) 100 MG tablet Take 1 tablet (100 mg total) by mouth 2 (two) times daily. Patient not taking: Reported on 10/17/2021 10/15/21  Yes Biagio Borg, MD  fluticasone [facility]) 50 MCG/ACT nasal spray Place 2 sprays into both nostrils daily. Patient taking differently: Place 2 sprays into both nostrils daily as needed for allergies. 02/18/21  Yes [name], MD  loratadine (CLARITIN) 10 MG tablet Take 10 mg by mouth daily.   Yes [provider]  losartan (COZAAR) 100 MG tablet Take 1 tablet (100 mg total) by mouth daily. 09/02/21  Yes Lelon Perla, MD  Multiple Vitamins-Minerals (CENTRUM SILVER 50+WOMEN) TABS Take 2 tablets by mouth daily.   Yes [provider]  pantoprazole (PROTONIX) 40 MG tablet TAKE 1 TABLET BY MOUTH TWICE A DAY BEFORE A MEAL 08/27/21  Yes Nandigam, Venia Minks, MD  aspirin EC 81 MG tablet Take 81 mg by mouth daily. Swallow whole.    [provider]  nystatin (MYCOSTATIN) 100000 UNIT/ML suspension TAKE 5 MLS (500,000 UNITS TOTAL) BY MOUTH 4 (FOUR) TIMES DAILY FOR 10 DAYS. 10/17/21 10/27/21  Biagio Borg, MD  propranolol (INDERAL) 20 MG tablet Take 1 tablet by mouth three times daily as needed Patient not taking: Reported on 10/16/2021 01/14/21   Minus Breeding, MD     All other systems have been reviewed and were otherwise negative with the exception of those mentioned in the HPI and as above.  Physical Exam: There were no vitals filed for this visit.  There is no height or weight on file to calculate BMI.  General: Alert, no acute  distress Cardiovascular: No pedal edema Respiratory: No cyanosis, no use of accessory musculature Skin: No lesions in the area of chief complaint Neurologic: Sensation intact distally Psychiatric: Patient is competent for consent with normal mood and affect Lymphatic: No axillary or cervical lymphadenopathy  MUSCULOSKELETAL: + spurling, TTP cervical PSM, See office not for full exam   Assessment/Plan: Ongoing left arm pain and weakness, with an MRI notable  for prominent spinal stenosis and spinal cord compression involving C4-5, C5-6, and C6-7 Plan for Procedure(s): ANTERIOR CERVICAL DECOMPRESSION FUSION CERVICAL 4- CERVICAL 5, CERVICAL 5- CERVICAL 6, CERVICAL 6- CERVICAL 7 WITH INSTRUMENTATION AND ALLOGRAFT   Norva Karvonen, MD 10/24/2021 6:40 AM

## 2021-10-24 NOTE — Transfer of Care (Signed)
Immediate Anesthesia Transfer of Care Note  Patient: Shannon Navarro  Procedure(s) Performed: ANTERIOR CERVICAL DECOMPRESSION FUSION CERVICAL FOUR THROUGH CERVICAL FIVE, CERVICAL FIVE THROUGH CERVICAL SIX, CERVICAL SIX THROUGH CERVICAL SEVEN WITH INSTRUMENTATION AND ALLOGRAFT (Spine Cervical)  Patient Location: PACU  Anesthesia Type:General  Level of Consciousness: awake, drowsy, patient cooperative and responds to stimulation  Airway & Oxygen Therapy: Patient Spontanous Breathing and Patient connected to nasal cannula oxygen  Post-op Assessment: Report given to RN, Post -op Vital signs reviewed and stable and Patient moving all extremities X 4  Post vital signs: Reviewed and stable  Last Vitals:  Vitals Value Taken Time  BP 131/69 10/24/21 1338  Temp    Pulse 79 10/24/21 1339  Resp 10 10/24/21 1339  SpO2 94 % 10/24/21 1339  Vitals shown include unvalidated device data.  Last Pain:  Vitals:   10/24/21 0816  PainSc: 0-No pain         Complications: No notable events documented.

## 2021-10-24 NOTE — Op Note (Signed)
PATIENT NAME: Shannon Navarro RECORD NO.:   [id number]    DATE OF BIRTH: 06/16/1955   DATE OF PROCEDURE: 10/24/2021                               OPERATIVE REPORT     PREOPERATIVE DIAGNOSES: 1. Left-sidedcervical radiculopathy. 2. Spinal stenosis spanning C4-C7.   POSTOPERATIVE DIAGNOSES: 1. Left-sidedcervical radiculopathy. 2. Spinal stenosis spanning C4-C7.   PROCEDURE: 1. Anterior cervical decompression and fusion C4/5, C5/6, C6/7. 2. Placement of anterior instrumentation, C4-C7. 3. Insertion of interbody device x 3 (Titan intervertebral spacers). 4. Intraoperative use of fluoroscopy. 5. Use of morselized allograft - ViviGen.   SURGEON:  Phylliss Bob, MD   ASSISTANT:  Pricilla Holm, PA-C.   ANESTHESIA:  General endotracheal anesthesia.   COMPLICATIONS:  None.   DISPOSITION:  Stable.   ESTIMATED BLOOD LOSS:  Minimal.   INDICATIONS FOR SURGERY:  Briefly, Shannon Navarro is a pleasant 66 y.o. year-old female, who did present to me with severe pain in the neck and left arm.  The patient's MRI did reveal the findings noted above.  Given the patient's ongoing rather debilitating pain and lack of improvement with appropriate treatment measures, we did discuss proceeding with the procedure noted above.  The patient was fully aware of the risks and limitations of surgery as outlined in my preoperative note.   OPERATIVE DETAILS:  On 10/24/2021, the patient was brought to surgery and general endotracheal anesthesia was administered.  The patient was placed supine on the hospital bed. The neck was gently extended.  All bony prominences were meticulously padded.  The neck was prepped and draped in the usual sterile fashion.  At this point, I did make a left-sided transverse incision.  The platysma was incised.  A Smith-Robinson approach was used and the anterior spine was identified. A self-retaining retractor was placed.  I then subperiosteally exposed the vertebral  bodies from C4-C7.  Caspar pins were then placed into the C6 and C7 vertebral bodies and distraction was applied.  A thorough and complete C6-7 intervertebral diskectomy was performed.  The posterior longitudinal ligament was identified and entered using a nerve hook.  I then used #1 followed by #2 Kerrison to perform a thorough and complete intervertebral diskectomy.  The spinal canal was thoroughly decompressed, as was the right and left neuroforamen.  The endplates were then prepared and the appropriate-sized intervertebral spacer was then packed with ViviGen and tamped into position in the usual fashion.  The lower Caspar pin was then removed and placed into the C5 vertebral body and once again, distraction was applied across the C5-6 intervertebral space.  I then again performed a thorough and complete diskectomy, thoroughly decompressing the spinal canal and bilateral neuroforamena.  After preparing the endplates, the appropriate-sized intervertebral spacer was packed with ViviGen and tamped into position.  The lower Caspar pin was then removed and placed into the C4 vertebral body and once again, distraction was applied across the C4-5 intervertebral space.  I then again performed a thorough and complete diskectomy, thoroughly decompressing the spinal canal and bilateral neuroforamena.  After preparing the endplates, the appropriate-sized intervertebral spacer was packed with ViviGen and tamped into position.  The Caspar pins then were removed and bone wax was placed in their place.  The appropriate-sized anterior cervical plate was placed over the anterior spine.  14 mm variable angle screws were placed, 2 in each vertebral body  from C4-C7 for a total of 8 vertebral body screws.  The screws were then locked to the plate using the Cam locking mechanism.  I was very pleased with the final fluoroscopic images.  The wound was then irrigated.  The wound was then explored for any undue  bleeding and there was no bleeding noted. The wound was then closed in layers using 2-0 Vicryl, followed by 4-0 Monocryl.  Benzoin and Steri-Strips were applied, followed by sterile dressing.  All instrument counts were correct at the termination of the procedure.   Of note, Pricilla Holm, PA-C, was my assistant throughout surgery, and did aid in retraction, suctioning, placement of the hardware, and closure from start to finish.     Phylliss Bob, MD

## 2021-10-25 ENCOUNTER — Encounter: Payer: Self-pay | Admitting: *Deleted

## 2021-10-25 ENCOUNTER — Telehealth: Payer: Self-pay | Admitting: *Deleted

## 2021-10-25 ENCOUNTER — Encounter (HOSPITAL_COMMUNITY): Payer: Self-pay | Admitting: Orthopedic Surgery

## 2021-10-25 MED FILL — Thrombin For Soln 20000 Unit: CUTANEOUS | Qty: 1 | Status: AC

## 2021-10-25 NOTE — Telephone Encounter (Signed)
This encounter was created in error - please disregard.

## 2021-10-25 NOTE — Telephone Encounter (Signed)
Transition Care Management Unsuccessful Follow-up Telephone Call  Date of discharge and from where:  10/24/21 FROM [place] PERIOPERATIVE AREA  Attempts:  1st Attempt  Reason for unsuccessful TCM follow-up call:  Voice mail full   Hubert Azure RN, MSN RN Care Management Coordinator  [phone] [email or web address]

## 2021-11-08 ENCOUNTER — Telehealth: Payer: Self-pay | Admitting: Internal Medicine

## 2021-11-08 NOTE — Telephone Encounter (Signed)
Patient called in stating that she is being billed for an unpaid copay for visit on 5/30. I don't see where a copay was collected that day but she states that her banking statement backs her up. Do you know what she needs to provide to get the charge posted to her account?

## 2021-11-26 DIAGNOSIS — M1711 Unilateral primary osteoarthritis, right knee: Secondary | ICD-10-CM | POA: Diagnosis not present

## 2021-11-26 DIAGNOSIS — M7061 Trochanteric bursitis, right hip: Secondary | ICD-10-CM | POA: Diagnosis not present

## 2021-12-03 ENCOUNTER — Other Ambulatory Visit: Payer: Self-pay | Admitting: Physician Assistant

## 2021-12-03 DIAGNOSIS — J351 Hypertrophy of tonsils: Secondary | ICD-10-CM

## 2021-12-09 DIAGNOSIS — Z9889 Other specified postprocedural states: Secondary | ICD-10-CM | POA: Diagnosis not present

## 2021-12-11 NOTE — Discharge Summary (Signed)
Patient ID: HONG TIMM MRN: [id number] DOB/AGE: [birth year] 65 y.o.  Admit date: 10/24/2021 Discharge date: 10/24/2021  Admission Diagnoses:  Principal Problem:   Radiculomyelopathy   Discharge Diagnoses:  Same  Past Medical History:  Diagnosis Date   Allergic rhinitis    Anticoagulant long-term use    XARELTO   ANXIETY    Dysrhythmia    Endometrial polyp    Endometrial polyp    Fatty liver 06/23/2007   GERD    Hiatal hernia    History of esophageal stricture    mainly due to gerd   History of exercise stress test 06/13/2015   PER DR HOCHREIN NOTE -- NO EVIDENCE ISCHEMIA   HYPERTENSION    Impaired fasting glucose    Migraine    hx of migraines none recent   OSA on CPAP followed by dr Halford Chessman   per study 02-09-2012  severe osa w/ AHI 33.8   Paroxysmal atrial fibrillation ([place]) cardioloigst-  dr hochrein   dx 02-21-2016 at ED   Pneumonia    with COVID   Pre-diabetes    Pseudotumor cerebri 2007   Sleep apnea    Vitamin D deficiency 07/2013    Surgeries: Procedure(s): ANTERIOR CERVICAL DECOMPRESSION FUSION CERVICAL FOUR THROUGH CERVICAL FIVE, CERVICAL FIVE THROUGH CERVICAL SIX, CERVICAL SIX THROUGH CERVICAL SEVEN WITH INSTRUMENTATION AND ALLOGRAFT on 10/24/2021   Consultants: none  Discharged Condition: Improved  Hospital Course: Shannon Navarro is an 66 y.o. female who was admitted 10/24/2021 for operative treatment of Radiculomyelopathy. Patient has severe unremitting pain that affects sleep, daily activities, and work/hobbies. After pre-op clearance the patient was taken to the operating room on 10/24/2021 and underwent  Procedure(s): ANTERIOR CERVICAL DECOMPRESSION FUSION CERVICAL FOUR THROUGH CERVICAL FIVE, CERVICAL FIVE THROUGH CERVICAL SIX, CERVICAL SIX THROUGH CERVICAL SEVEN WITH INSTRUMENTATION AND ALLOGRAFT.    Patient was given perioperative antibiotics:  Anti-infectives (From admission, onward)    Start     Dose/Rate Route Frequency Ordered  Stop   10/24/21 0815  vancomycin (VANCOREADY) IVPB 1500 mg/300 mL        1,500 mg 150 mL/hr over 120 Minutes Intravenous On call to O.R. 10/24/21 0807 10/24/21 1119        Patient was given sequential compression devices, early ambulation to prevent DVT.  Patient benefited maximally from hospital stay and there were no complications.    Recent vital signs: BP (!) 122/57 (BP Location: Left Arm)   Pulse 60   Temp 98.1 F (36.7 C)   Resp 14   Ht '5\' 6"'$  (1.676 m)   Wt 110.2 kg   SpO2 99%   BMI 39.22 kg/m    Discharge Medications:   Allergies as of 10/24/2021       Reactions   Cefaclor Rash   Codeine Nausea And Vomiting   Hydrocodone Nausea And Vomiting   Severe vomiting/  COUGH SYRUP CAUSE SEVERE VOMITING   Levaquin [levofloxacin] Nausea Only   Nexium [esomeprazole Magnesium] Other (See Comments)   Made legs ache   Penicillins Rash   Sulfa Antibiotics Rash        Medication List     STOP taking these medications    aspirin EC 81 MG tablet       TAKE these medications    amLODipine 5 MG tablet Commonly known as: NORVASC Take 1 tablet (5 mg total) by mouth daily.   Centrum Silver 50+Women Tabs Take 2 tablets by mouth daily.   doxycycline 100 MG tablet Commonly known  as: VIBRA-TABS Take 1 tablet (100 mg total) by mouth 2 (two) times daily.   fluticasone 50 MCG/ACT nasal spray Commonly known as: FLONASE Place 2 sprays into both nostrils daily. What changed:  when to take this reasons to take this   HYDROcodone-acetaminophen 5-325 MG tablet Commonly known as: NORCO/VICODIN Take 1 tablet by mouth every 6 (six) hours as needed for moderate pain or severe pain.   loratadine 10 MG tablet Commonly known as: CLARITIN Take 10 mg by mouth daily.   losartan 100 MG tablet Commonly known as: COZAAR Take 1 tablet (100 mg total) by mouth daily.   methocarbamol 750 MG tablet Commonly known as: ROBAXIN Take 1 tablet (750 mg total) by mouth every 6 (six) hours  as needed for muscle spasms.   ondansetron 4 MG tablet Commonly known as: Zofran Take 1 tablet (4 mg total) by mouth every 8 (eight) hours as needed for nausea or vomiting.   pantoprazole 40 MG tablet Commonly known as: PROTONIX TAKE 1 TABLET BY MOUTH TWICE A DAY BEFORE A MEAL   propranolol 20 MG tablet Commonly known as: INDERAL Take 1 tablet by mouth three times daily as needed   Vitamin D 50 MCG (2000 UT) tablet Take 2,000 Units by mouth daily.       ASK your doctor about these medications    nystatin 100000 UNIT/ML suspension Commonly known as: MYCOSTATIN TAKE 5 MLS (500,000 UNITS TOTAL) BY MOUTH 4 (FOUR) TIMES DAILY FOR 10 DAYS. Ask about: Should I take this medication?        Diagnostic Studies: No results found.  Disposition: Discharge disposition: 01-Home or Self Care       Discharge Instructions     Discharge patient   Complete by: As directed    Discharge disposition: 01-Home or Self Care   Discharge patient date: 10/24/2021       -Scripts for pain sent to pharmacy electronically  -D/C instructions sheet printed and in chart -D/C today  -F/U in office 2 weeks   Signed: Justice Britain 12/11/2021, 12:38 PM

## 2021-12-16 DIAGNOSIS — M6281 Muscle weakness (generalized): Secondary | ICD-10-CM | POA: Diagnosis not present

## 2021-12-16 DIAGNOSIS — M4322 Fusion of spine, cervical region: Secondary | ICD-10-CM | POA: Diagnosis not present

## 2021-12-23 DIAGNOSIS — M6281 Muscle weakness (generalized): Secondary | ICD-10-CM | POA: Diagnosis not present

## 2021-12-23 DIAGNOSIS — M4322 Fusion of spine, cervical region: Secondary | ICD-10-CM | POA: Diagnosis not present

## 2021-12-26 ENCOUNTER — Encounter: Payer: Self-pay | Admitting: Internal Medicine

## 2021-12-26 ENCOUNTER — Ambulatory Visit: Payer: BC Managed Care – PPO | Admitting: Internal Medicine

## 2021-12-26 VITALS — BP 134/76 | HR 52 | Temp 98.3°F | Ht 66.0 in | Wt 236.0 lb

## 2021-12-26 DIAGNOSIS — E559 Vitamin D deficiency, unspecified: Secondary | ICD-10-CM | POA: Diagnosis not present

## 2021-12-26 DIAGNOSIS — R5383 Other fatigue: Secondary | ICD-10-CM

## 2021-12-26 DIAGNOSIS — E538 Deficiency of other specified B group vitamins: Secondary | ICD-10-CM | POA: Diagnosis not present

## 2021-12-26 DIAGNOSIS — M4322 Fusion of spine, cervical region: Secondary | ICD-10-CM | POA: Diagnosis not present

## 2021-12-26 DIAGNOSIS — R7303 Prediabetes: Secondary | ICD-10-CM | POA: Diagnosis not present

## 2021-12-26 DIAGNOSIS — J039 Acute tonsillitis, unspecified: Secondary | ICD-10-CM

## 2021-12-26 DIAGNOSIS — M6281 Muscle weakness (generalized): Secondary | ICD-10-CM | POA: Diagnosis not present

## 2021-12-26 DIAGNOSIS — F419 Anxiety disorder, unspecified: Secondary | ICD-10-CM | POA: Diagnosis not present

## 2021-12-26 LAB — URINALYSIS, ROUTINE W REFLEX MICROSCOPIC
Bilirubin Urine: NEGATIVE
Hgb urine dipstick: NEGATIVE
Ketones, ur: NEGATIVE
Leukocytes,Ua: NEGATIVE
Nitrite: NEGATIVE
Specific Gravity, Urine: 1.01 (ref 1.000–1.030)
Total Protein, Urine: NEGATIVE
Urine Glucose: NEGATIVE
Urobilinogen, UA: 0.2 (ref 0.0–1.0)
pH: 6 (ref 5.0–8.0)

## 2021-12-26 LAB — BASIC METABOLIC PANEL
BUN: 16 mg/dL (ref 6–23)
CO2: 30 mEq/L (ref 19–32)
Calcium: 9.5 mg/dL (ref 8.4–10.5)
Chloride: 107 mEq/L (ref 96–112)
Creatinine, Ser: 0.86 mg/dL (ref 0.40–1.20)
GFR: 71.17 mL/min (ref >60.00)
Glucose, Bld: 95 mg/dL (ref 70–99)
Potassium: 3.8 mEq/L (ref 3.5–5.1)
Sodium: 145 mEq/L (ref 135–145)

## 2021-12-26 LAB — CBC WITH DIFFERENTIAL/PLATELET
Basophils Absolute: 0.1 10*3/uL (ref 0.0–0.1)
Basophils Relative: 0.7 % (ref 0.0–3.0)
Eosinophils Absolute: 0.2 10*3/uL (ref 0.0–0.7)
Eosinophils Relative: 2.6 % (ref 0.0–5.0)
HCT: 42.6 % (ref 36.0–46.0)
Hemoglobin: 14.2 g/dL (ref 12.0–15.0)
Lymphocytes Relative: 16.4 % (ref 12.0–46.0)
Lymphs Abs: 1.2 10*3/uL (ref 0.7–4.0)
MCHC: 33.2 g/dL (ref 30.0–36.0)
MCV: 86.6 fl (ref 78.0–100.0)
Monocytes Absolute: 0.8 10*3/uL (ref 0.1–1.0)
Monocytes Relative: 11.1 % (ref 3.0–12.0)
Neutro Abs: 5.2 10*3/uL (ref 1.4–7.7)
Neutrophils Relative %: 69.2 % (ref 43.0–77.0)
Platelets: 255 10*3/uL (ref 150.0–400.0)
RBC: 4.92 Mil/uL (ref 3.87–5.11)
RDW: 14.5 % (ref 11.5–15.5)
WBC: 7.5 10*3/uL (ref 4.0–10.5)

## 2021-12-26 LAB — TSH: TSH: 0.85 u[IU]/mL (ref 0.35–5.50)

## 2021-12-26 LAB — HEPATIC FUNCTION PANEL
ALT: 17 U/L (ref 0–35)
AST: 15 U/L (ref 0–37)
Albumin: 4 g/dL (ref 3.5–5.2)
Alkaline Phosphatase: 79 U/L (ref 39–117)
Bilirubin, Direct: 0.3 mg/dL (ref 0.0–0.3)
Total Bilirubin: 1 mg/dL (ref 0.2–1.2)
Total Protein: 7.1 g/dL (ref 6.0–8.3)

## 2021-12-26 LAB — HEMOGLOBIN A1C: Hgb A1c MFr Bld: 6 % (ref 4.6–6.5)

## 2021-12-26 LAB — VITAMIN D 25 HYDROXY (VIT D DEFICIENCY, FRACTURES): VITD: 30.02 ng/mL (ref 30.00–100.00)

## 2021-12-26 LAB — MAGNESIUM: Magnesium: 2 mg/dL (ref 1.5–2.5)

## 2021-12-26 LAB — VITAMIN B12: Vitamin B-12: 419 pg/mL (ref 211–911)

## 2021-12-26 MED ORDER — VENLAFAXINE HCL ER 75 MG PO CP24: 75.0000 mg | ORAL_CAPSULE | Freq: Every day | ORAL | 3 refills | Status: DC

## 2021-12-26 NOTE — Assessment & Plan Note (Signed)
Persistent with ? Suggestion possible malignant per pt per ENT; has f/u exam for worsening right ear pain tomorrow, maybe can get surgury scheduled sooner?

## 2021-12-26 NOTE — Assessment & Plan Note (Signed)
Last vitamin D Lab Results  Component Value Date   VD25OH 30.02 12/26/2021   Low, reminded to start oral replacement

## 2021-12-26 NOTE — Progress Notes (Signed)
Patient ID: Shannon Navarro, female   DOB: 09/22/1955, 66 y.o.   MRN: [id number]        Chief Complaint: follow up anxiety, general weakness wondering about have labs done       HPI:  Shannon Navarro is a 66 y.o. female here with c/o Father died 06/10/24husband fell down with PD, and having surgury ankle frature tomorrow, she handles the father estate, and she has her own surgury soon for ? Malignancy tonsillar tissue.  Saw ENT July 25, then for tonsil surgury scheduled soon sept 1.  Does have f/u appt tomorrow with worsening right ear pain as well with ENT.  Denies worsening depressive symptoms, suicidal ideation, or panic; has ongoing anxiety situationally worse   Does not feel she needs counseling.  Had effexr years ago for hot flashes. Declines SSRI but asks to restart effexor.  Pt denies chest pain, increased sob or doe, wheezing, orthopnea, PND, increased LE swelling, palpitations, dizziness or syncope. Pt denies polydipsia, polyuria, or new focal neuro s/s.  Pt denies fever, night sweats, loss of appetite, or other constitutional symptoms, except has lost considerable wt for unclear reason.  Denies dysphagia  Wt Readings from Last 3 Encounters:  12/26/21 236 lb (107 kg)  10/24/21 243 lb (110.2 kg)  10/21/21 248 lb 1.6 oz (112.5 kg)   BP Readings from Last 3 Encounters:  12/26/21 134/76  10/24/21 (!) 122/57  10/21/21 128/63         Past Medical History:  Diagnosis Date   Allergic rhinitis    Anticoagulant long-term use    XARELTO   ANXIETY    Dysrhythmia    Endometrial polyp    Endometrial polyp    Fatty liver 06/23/2007   GERD    Hiatal hernia    History of esophageal stricture    mainly due to gerd   History of exercise stress test 06/13/2015   PER Shannon Navarro NOTE -- NO EVIDENCE ISCHEMIA   HYPERTENSION    Impaired fasting glucose    Migraine    hx of migraines none recent   OSA on CPAP followed by Shannon Halford Chessman   per study 02-09-2012  severe osa w/ AHI 33.8   Paroxysmal  atrial fibrillation (Shannon Navarro) cardioloigst-  Shannon Navarro   dx 02-21-2016 at ED   Pneumonia    with COVID   Pre-diabetes    Pseudotumor cerebri 2007   Sleep apnea    Vitamin D deficiency 07/2013   Past Surgical History:  Procedure Laterality Date   ABDOMINAL HYSTERECTOMY     ANTERIOR CERVICAL DECOMPRESSION/DISCECTOMY FUSION 4 LEVELS N/A 10/24/2021   Procedure: ANTERIOR CERVICAL DECOMPRESSION FUSION CERVICAL FOUR THROUGH CERVICAL FIVE, CERVICAL FIVE THROUGH CERVICAL SIX, CERVICAL SIX THROUGH CERVICAL SEVEN WITH INSTRUMENTATION AND ALLOGRAFT;  Surgeon: Phylliss Bob, Navarro;  Location: [facility];  Service: Orthopedics;  Laterality: N/A;   BREAST BIOPSY Right 08/12/2019    FIBROADENOMA    COLONOSCOPY     colonscopy  2007   normal   DILATATION & CURETTAGE/HYSTEROSCOPY WITH MYOSURE N/A 03/26/2017   Procedure: DILATATION & CURETTAGE/HYSTEROSCOPY WITH MYOSURE;  Surgeon: Shannon Compton, Navarro;  Location: [place] [facility];  Service: Gynecology;  Laterality: N/A;   DILATION AND CURETTAGE OF UTERUS  2010   FRACTURE SURGERY     LAPAROSCOPIC HYSTERECTOMY Bilateral 06/18/2017   Procedure: HYSTERECTOMY TOTAL LAPAROSCOPIC;  Surgeon: Shannon Compton, Navarro;  Location: [place];  Service: Gynecology;  Laterality: Bilateral;  OUT PT IN BED, needs extra  time Navarro request 2.5hrs in OR   right knee arthroscopy  03/2013   TRANSTHORACIC ECHOCARDIOGRAM  03/11/2016   ef 60-65%/  mild MR/ trivial PR and TR   TUBAL LIGATION     UPPER GASTROINTESTINAL ENDOSCOPY  11/2020    reports that she has never smoked. She has never used smokeless tobacco. She reports that she does not drink alcohol and does not use drugs. family history includes Aortic stenosis in her father; Asthma in her mother; Breast cancer in her maternal aunt, maternal aunt, mother, and niece; Colon cancer in an other family member; Colon polyps in her father; Diabetes in her father; Esophageal cancer in an other family member; Heart  disease in her father; Hypertension in her father and mother; Prostate cancer in her father; Stomach cancer in an other family member. Allergies  Allergen Reactions   Cefaclor Rash   Codeine Nausea And Vomiting   Hydrocodone Nausea And Vomiting    Severe vomiting/  COUGH SYRUP CAUSE SEVERE VOMITING   Levaquin [Levofloxacin] Nausea Only   Nexium [Esomeprazole Magnesium] Other (See Comments)    Made legs ache   Penicillins Rash   Sulfa Antibiotics Rash   Current Outpatient Medications on File Prior to Visit  Medication Sig Dispense Refill   amLODipine (NORVASC) 5 MG tablet Take 1 tablet (5 mg total) by mouth daily. 30 tablet 11   Cholecalciferol (VITAMIN D) 50 MCG (2000 UT) tablet Take 2,000 Units by mouth daily.     fluticasone (FLONASE) 50 MCG/ACT nasal spray Place 2 sprays into both nostrils daily. (Patient taking differently: Place 2 sprays into both nostrils daily as needed for allergies.) 48 g 6   loratadine (CLARITIN) 10 MG tablet Take 10 mg by mouth daily.     losartan (COZAAR) 100 MG tablet Take 1 tablet (100 mg total) by mouth daily. 90 tablet 3   Multiple Vitamins-Minerals (CENTRUM SILVER 50+WOMEN) TABS Take 2 tablets by mouth daily.     pantoprazole (PROTONIX) 40 MG tablet TAKE 1 TABLET BY MOUTH TWICE A DAY BEFORE A MEAL 180 tablet 1   propranolol (INDERAL) 20 MG tablet Take 1 tablet by mouth three times daily as needed 90 tablet 3   No current facility-administered medications on file prior to visit.        ROS:  All others reviewed and negative.  Objective        PE:  BP 134/76 (BP Location: Right Arm, Patient Position: Sitting, Cuff Size: Large)   Pulse (!) 52   Temp 98.3 F (36.8 C) (Oral)   Ht '5\' 6"'$  (1.676 m)   Wt 236 lb (107 kg)   SpO2 99%   BMI 38.09 kg/m                 Constitutional: Pt appears in NAD               HENT: Head: NCAT.                Right Ear: External ear normal.                 Left Ear: External ear normal.                Eyes: .  Pupils are equal, round, and reactive to light. Conjunctivae and EOM are normal               Nose: without d/c or deformity  Neck: Neck supple. Gross normal ROM, but with ? Borderline sized firm LA mid left preSCM               Cardiovascular: Normal rate and regular rhythm.                 Pulmonary/Chest: Effort normal and breath sounds without rales or wheezing.                Abd:  Soft, NT, ND, + BS, no organomegaly               Neurological: Pt is alert. At baseline orientation, motor grossly intact               Skin: Skin is warm. No rashes, no other new lesions, LE edema - none               Psychiatric: Pt behavior is normal without agitation   Micro: none  Cardiac tracings I have personally interpreted today:  none  Pertinent Radiological findings (summarize): none   Lab Results  Component Value Date   WBC 7.5 12/26/2021   HGB 14.2 12/26/2021   HCT 42.6 12/26/2021   PLT 255.0 12/26/2021   GLUCOSE 95 12/26/2021   CHOL 164 04/01/2021   TRIG 114.0 04/01/2021   HDL 43.70 04/01/2021   LDLCALC 97 04/01/2021   ALT 17 12/26/2021   AST 15 12/26/2021   NA 145 12/26/2021   K 3.8 12/26/2021   CL 107 12/26/2021   CREATININE 0.86 12/26/2021   BUN 16 12/26/2021   CO2 30 12/26/2021   TSH 0.85 12/26/2021   HGBA1C 6.0 12/26/2021   Assessment/Plan:  OMEGA DURANTE is a 66 y.o. White or Caucasian [1] female with  has a past medical history of Allergic rhinitis, Anticoagulant long-term use, ANXIETY, Dysrhythmia, Endometrial polyp, Endometrial polyp, Fatty liver (06/23/2007), GERD, Hiatal hernia, History of esophageal stricture, History of exercise stress test (06/13/2015), HYPERTENSION, Impaired fasting glucose, Migraine, OSA on CPAP (followed by Shannon Halford Chessman), Paroxysmal atrial fibrillation ([place]) (cardioloigst-  Shannon [name]), Pneumonia, Pre-diabetes, Pseudotumor cerebri (2007), Sleep apnea, and Vitamin D deficiency (07/2013).  Anxiety situationally worse with multiple  stressors, has tolerated effexor and effective in apst, ok for effexor xr 75 qd restart,  to f/u any worsening symptoms or concerns  Prediabetes Lab Results  Component Value Date   HGBA1C 6.0 12/26/2021   Stable, pt to continue current medical treatment  - diet, wt control, excercise   Vitamin D deficiency Last vitamin D Lab Results  Component Value Date   VD25OH 30.02 12/26/2021   Low, reminded to start oral replacement   Fatigue ? Malignancy related vs other, denies signficiant depression, but has worsening anxiety and wt loss, also for lab today including cbc  Tonsillitis Persistent with ? Suggestion possible malignant per pt per ENT; has f/u exam for worsening right ear pain tomorrow, maybe can get surgury scheduled sooner?  Followup: Return if symptoms worsen or fail to improve.  Cathlean Cower, Navarro 12/26/2021 9:49 PM [facility]

## 2021-12-26 NOTE — Patient Instructions (Addendum)
Please take all new medication as prescribed - the effexor  Please continue all other medications as before, and refills have been done if requested.  Please have the pharmacy call with any other refills you may need.  Please keep your appointments with your specialists as you may have planned - ENT  Please go to the LAB at the blood drawing area for the tests to be done  You will be contacted by phone if any changes need to be made immediately.  Otherwise, you will receive a letter about your results with an explanation, but please check with MyChart first.  Please remember to sign up for MyChart if you have not done so, as this will be important to you in the future with finding out test results, communicating by private email, and scheduling acute appointments online when needed.

## 2021-12-26 NOTE — Assessment & Plan Note (Signed)
Lab Results  Component Value Date   HGBA1C 6.0 12/26/2021   Stable, pt to continue current medical treatment  - diet, wt control, excercise

## 2021-12-26 NOTE — Assessment & Plan Note (Signed)
situationally worse with multiple stressors, has tolerated effexor and effective in apst, ok for effexor xr 75 qd restart,  to f/u any worsening symptoms or concerns

## 2021-12-26 NOTE — Assessment & Plan Note (Signed)
?   Malignancy related vs other, denies signficiant depression, but has worsening anxiety and wt loss, also for lab today including cbc

## 2021-12-27 DIAGNOSIS — H9011 Conductive hearing loss, unilateral, right ear, with unrestricted hearing on the contralateral side: Secondary | ICD-10-CM | POA: Diagnosis not present

## 2021-12-27 DIAGNOSIS — H6121 Impacted cerumen, right ear: Secondary | ICD-10-CM | POA: Diagnosis not present

## 2021-12-31 NOTE — H&P (Signed)
HPI:   Chief Complaint  Patient presents with   Tonsilitis  Patient states left tonsil is enlarged, nasty taste in mouth on left side.Had spinal fusion done on June 5.   Shannon Navarro is a pleasant 66 y.o. female who presents as a new patient for tonsil concerns. Feels her left tonsil is larger than the right since May when she had a viral upper respiratory infection. States her tonsils have always been enlarged but now she is feeling like something is there on the left. Medical therapy included 3 rounds of antibiotics without change. No problems with tonsil stones. She also reports a foul odor from her throat. Her swallowing is fine. She underwent anterior approach cervical spine surgery in early June; is getting her soft neck brace off tomorrow.   Denies otalgia, otorrhea, ear pruritus, hearing loss, tinnitus, dizziness, nasal obstruction, rhinorrhea, facial pressure/pain, sore throat, dysphagia, odynophagia or neck swelling.  PMH of obstructive sleep apnea (moderate); uses nasal pillow mask.   No PMH of heart attack, stroke, diabetes, asthma, bleeding disorder or adverse reaction to anesthesia.   Non-smoker. No alcohol.   PMH/Meds/All/SocHx/FamHx/ROS:   Past Medical History:  Diagnosis Date   GERD (gastroesophageal reflux disease)   Hypertension   Sleep apnea   Past Surgical History:  Procedure Laterality Date   TUBAL LIGATION   No family history of bleeding disorders, wound healing problems or difficulty with anesthesia.   Social History   Socioeconomic History   Marital status: Married  Spouse name: Not on file   Number of children: Not on file   Years of education: Not on file   Highest education level: Not on file  Occupational History   Not on file  Tobacco Use   Smoking status: Never   Smokeless tobacco: Never  Vaping Use   Vaping Use: Never used  Substance and Sexual Activity   Alcohol use: Never   Drug use: Never   Sexual activity: Not on file  Other  Topics Concern   Not on file  Social History Narrative   Not on file   Social Determinants of Health   Financial Resource Strain: Not on file  Food Insecurity: Not on file  Transportation Needs: Not on file  Physical Activity: Not on file  Stress: Not on file  Social Connections: Not on file  Housing Stability: Not on file   Current Outpatient Medications:   amLODIPine (NORVASC) 5 MG tablet, amlodipine 5 mg tablet, Disp: , Rfl:   loratadine (CLARITIN) 10 mg tablet, Take 1 tablet (10 mg total) by mouth daily., Disp: , Rfl:   losartan (COZAAR) 100 MG tablet, Take 1 tablet (100 mg total) by mouth daily., Disp: , Rfl:   HYDROcodone-acetaminophen (NORCO) 5-325 mg per tablet, , Disp: , Rfl:   pantoprazole (PROTONIX) 40 MG tablet, TAKE 1 TABLET BY MOUTH TWICE A DAY BEFORE A MEAL, Disp: , Rfl:   ranitidine (ZANTAC) 300 MG tablet, , Disp: , Rfl:   VENLAFAXINE HCL (EFFEXOR ORAL), Take by mouth., Disp: , Rfl:   XARELTO 20 mg tablet *ANTICOAGULANT*, , Disp: , Rfl:   A complete ROS was performed with pertinent positives/negatives noted in the HPI. The remainder of the ROS are negative.   Physical Exam:   BP 150/72  Pulse 49  Temp 97.6 F (36.4 C) (Oral)  Resp 16  Ht 1.676 m ('5\' 6"'$ )  Wt 108.5 kg (239 lb 3.2 oz)  BMI 38.61 kg/m   General Awake, at baseline alertness during examination.  Eyes  No scleral icterus or conjunctival hemorrhage. Globe position appears normal. EOMI.  Right Ear EAC patent, TM intact w/o inflammation. Middle ear well aerated.  Left Ear EAC patent, TM intact w/o inflammation. Middle ear well aerated.  Nose Patent, no polyps or masses seen on anterior rhinoscopy.  Oral cavity No mucosal lesions or tumors seen. Tongue midline.  Oropharynx Right tonsil 2+. Left tonsil 3+, somewhat firm at the superior pole.  Larynx (indirect mirror exam) Base of tongue normal and symmetric. Epiglottis normal. Vocal cords with good mobility; no mass, nodule or polyp.  Neck No  abnormal cervical lymphadenopathy. No thyromegaly. No thyroid masses palpated. Well-healing transverse scar, left of midline.  Cardio-vascular No cyanosis.  Pulmonary No audible stridor. Breathing easily with no labor.  Neuro Symmetric facial movement.  Psychiatry Appropriate affect and mood for clinic visit.   Independent Review of Additional Tests or Records:  Medical records.   Procedures:  None  Impression & Plans:  Shannon Navarro is a 66 y.o. female with left tonsillary asymmetry. Given history and exam, recommend CT neck with contrast for further evaluation. Dr. Constance Holster discussed with patient the possibility for tonsillectomy depending on imaging results. We will contact her with results.   Follow up accordingly with ENT, patient agrees with the plan.

## 2022-01-06 DIAGNOSIS — M6281 Muscle weakness (generalized): Secondary | ICD-10-CM | POA: Diagnosis not present

## 2022-01-06 DIAGNOSIS — M4322 Fusion of spine, cervical region: Secondary | ICD-10-CM | POA: Diagnosis not present

## 2022-01-09 ENCOUNTER — Other Ambulatory Visit: Payer: Self-pay

## 2022-01-09 ENCOUNTER — Encounter (HOSPITAL_COMMUNITY): Payer: Self-pay | Admitting: Otolaryngology

## 2022-01-09 NOTE — Anesthesia Preprocedure Evaluation (Addendum)
Anesthesia Evaluation  Patient identified by MRN, date of birth, ID band Patient awake    Reviewed: Allergy & Precautions, NPO status , Patient's Chart, lab work & pertinent test results  History of Anesthesia Complications (+) PONV and history of anesthetic complications  Airway Mallampati: III  TM Distance: >3 FB Neck ROM: Full    Dental  (+) Teeth Intact, Dental Advisory Given   Pulmonary sleep apnea and Continuous Positive Airway Pressure Ventilation    Pulmonary exam normal breath sounds clear to auscultation       Cardiovascular hypertension (161/64 in preop), Pt. on medications Normal cardiovascular exam+ dysrhythmias Atrial Fibrillation  Rhythm:Regular Rate:Normal     Neuro/Psych  Headaches PSYCHIATRIC DISORDERS Anxiety        GI/Hepatic Neg liver ROS,GERD  Controlled and Medicated,,  Endo/Other  diabetes (pre-diabetic)  Obesity BMI 37  Renal/GU negative Renal ROS  negative genitourinary   Musculoskeletal negative musculoskeletal ROS (+)    Abdominal  (+) + obese  Peds  Hematology negative hematology ROS (+)   Anesthesia Other Findings   Reproductive/Obstetrics negative OB ROS                             Anesthesia Physical Anesthesia Plan  ASA: 3  Anesthesia Plan: General   Post-op Pain Management: Tylenol PO (pre-op)*   Induction: Intravenous  PONV Risk Score and Plan: 4 or greater and Ondansetron, Dexamethasone, Midazolam, Scopolamine patch - Pre-op and Treatment may vary due to age or medical condition  Airway Management Planned: Oral ETT  Additional Equipment: None  Intra-op Plan:   Post-operative Plan: Extubation in OR  Informed Consent: I have reviewed the patients History and Physical, chart, labs and discussed the procedure including the risks, benefits and alternatives for the proposed anesthesia with the patient or authorized representative who has  indicated his/her understanding and acceptance.     Dental advisory given  Plan Discussed with: CRNA  Anesthesia Plan Comments: ( )        Anesthesia Quick Evaluation

## 2022-01-09 NOTE — Progress Notes (Addendum)
Anesthesia Review:  PCP: DR  Cathlean Cower - [facility] 12/26/2021.  Cardiologist : DR Lilli Few Monge- 10/18/21- [facility]  Chest x-ray : EKG : 09/09/21  Echo : 09/08/19  Stress test: 06/13/2015  Cardiac Cath :  Activity level:  Sleep Study/ CPAP : Fasting Blood Sugar :      / Checks Blood Sugar -- times a day:   Blood Thinner/ Instructions /Last Dose: ASA / Instructions/ Last Dose :   Labs- 12/26/21=- hgba1c- 6.0  Prediabetes- does not check glucose at home  CBC /Diff and CMp done 12/26/21  Preop phone call completed.  Reviewed med hx and preop instructions . PT voiced understanding.   PT with hx of bradycardia and hx of PAF x 2 per pt.   PT aware to bring cpap mask and tubing DOS.  PT aware NPO after midnite with meds with sip of water am of surgery.   Secure chart in regards to pt status sent to [facility].

## 2022-01-10 ENCOUNTER — Observation Stay (HOSPITAL_COMMUNITY)
Admission: RE | Admit: 2022-01-10 | Discharge: 2022-01-10 | Disposition: A | Discharge status: Home / Self Care | Payer: BC Managed Care – PPO | Attending: Otolaryngology | Admitting: Otolaryngology

## 2022-01-10 ENCOUNTER — Observation Stay (HOSPITAL_COMMUNITY): Payer: BC Managed Care – PPO | Admitting: Physician Assistant

## 2022-01-10 ENCOUNTER — Encounter (HOSPITAL_COMMUNITY)
Admission: RE | Disposition: A | Discharge status: Home / Self Care | Payer: Self-pay | Source: Home / Self Care | Attending: Otolaryngology

## 2022-01-10 ENCOUNTER — Other Ambulatory Visit: Payer: Self-pay

## 2022-01-10 ENCOUNTER — Encounter (HOSPITAL_COMMUNITY): Payer: Self-pay | Admitting: Otolaryngology

## 2022-01-10 DIAGNOSIS — Z7901 Long term (current) use of anticoagulants: Secondary | ICD-10-CM | POA: Insufficient documentation

## 2022-01-10 DIAGNOSIS — Z9989 Dependence on other enabling machines and devices: Secondary | ICD-10-CM | POA: Diagnosis not present

## 2022-01-10 DIAGNOSIS — G4733 Obstructive sleep apnea (adult) (pediatric): Secondary | ICD-10-CM | POA: Diagnosis not present

## 2022-01-10 DIAGNOSIS — J351 Hypertrophy of tonsils: Secondary | ICD-10-CM | POA: Diagnosis not present

## 2022-01-10 DIAGNOSIS — I10 Essential (primary) hypertension: Secondary | ICD-10-CM | POA: Insufficient documentation

## 2022-01-10 DIAGNOSIS — A429 Actinomycosis, unspecified: Secondary | ICD-10-CM | POA: Diagnosis not present

## 2022-01-10 DIAGNOSIS — Z79899 Other long term (current) drug therapy: Secondary | ICD-10-CM | POA: Diagnosis not present

## 2022-01-10 HISTORY — DX: Nausea with vomiting, unspecified: Z98.890

## 2022-01-10 HISTORY — PX: TONSILLECTOMY: SHX5217

## 2022-01-10 HISTORY — DX: Nausea with vomiting, unspecified: R11.2

## 2022-01-10 SURGERY — TONSILLECTOMY
Anesthesia: General | Laterality: Bilateral

## 2022-01-10 MED ORDER — PROPOFOL 10 MG/ML IV BOLUS
INTRAVENOUS | Status: DC | PRN
  Administered 2022-01-10: 130 mg via INTRAVENOUS

## 2022-01-10 MED ORDER — ORAL CARE MOUTH RINSE: 15.0000 mL | Freq: Once | OROMUCOSAL | Status: AC

## 2022-01-10 MED ORDER — OXYCODONE HCL 5 MG PO TABS
5.0000 mg | ORAL_TABLET | Freq: Once | ORAL | Status: AC | PRN
  Administered 2022-01-10: 5 mg via ORAL

## 2022-01-10 MED ORDER — ACETAMINOPHEN 500 MG PO TABS
1000.0000 mg | ORAL_TABLET | Freq: Once | ORAL | Status: AC
  Administered 2022-01-10: 1000 mg via ORAL
  Filled 2022-01-10: qty 2

## 2022-01-10 MED ORDER — HYDROCODONE-ACETAMINOPHEN 7.5-325 MG/15ML PO SOLN: 15.0000 mL | Freq: Four times a day (QID) | ORAL | 0 refills | Status: DC | PRN

## 2022-01-10 MED ORDER — ONDANSETRON 4 MG PO TBDP: 4.0000 mg | ORAL_TABLET | Freq: Three times a day (TID) | ORAL | 0 refills | Status: DC | PRN

## 2022-01-10 MED ORDER — CHLORHEXIDINE GLUCONATE 0.12 % MT SOLN
15.0000 mL | Freq: Once | OROMUCOSAL | Status: AC
  Administered 2022-01-10: 15 mL via OROMUCOSAL
  Filled 2022-01-10: qty 15

## 2022-01-10 MED ORDER — PROPOFOL 10 MG/ML IV BOLUS
INTRAVENOUS | Status: AC
  Filled 2022-01-10: qty 20

## 2022-01-10 MED ORDER — LIDOCAINE 2% (20 MG/ML) 5 ML SYRINGE
INTRAMUSCULAR | Status: DC | PRN
  Administered 2022-01-10: 100 mg via INTRAVENOUS

## 2022-01-10 MED ORDER — LACTATED RINGERS IV SOLN
INTRAVENOUS | Status: DC
Start: 2022-01-10 — End: 2022-01-10

## 2022-01-10 MED ORDER — AMISULPRIDE (ANTIEMETIC) 5 MG/2ML IV SOLN: 10.0000 mg | Freq: Once | INTRAVENOUS | Status: DC | PRN

## 2022-01-10 MED ORDER — 0.9 % SODIUM CHLORIDE (POUR BTL) OPTIME
TOPICAL | Status: DC | PRN
  Administered 2022-01-10: 1000 mL

## 2022-01-10 MED ORDER — FENTANYL CITRATE (PF) 250 MCG/5ML IJ SOLN
INTRAMUSCULAR | Status: AC
  Filled 2022-01-10: qty 5

## 2022-01-10 MED ORDER — SCOPOLAMINE 1 MG/3DAYS TD PT72
1.0000 | MEDICATED_PATCH | TRANSDERMAL | Status: DC
  Administered 2022-01-10: 1.5 mg via TRANSDERMAL
  Filled 2022-01-10: qty 1

## 2022-01-10 MED ORDER — ROCURONIUM BROMIDE 10 MG/ML (PF) SYRINGE
PREFILLED_SYRINGE | INTRAVENOUS | Status: DC | PRN
  Administered 2022-01-10: 40 mg via INTRAVENOUS

## 2022-01-10 MED ORDER — FENTANYL CITRATE (PF) 250 MCG/5ML IJ SOLN
INTRAMUSCULAR | Status: DC | PRN
  Administered 2022-01-10: 100 ug via INTRAVENOUS

## 2022-01-10 MED ORDER — MIDAZOLAM HCL 2 MG/2ML IJ SOLN
INTRAMUSCULAR | Status: DC | PRN
  Administered 2022-01-10: 2 mg via INTRAVENOUS

## 2022-01-10 MED ORDER — MIDAZOLAM HCL 2 MG/2ML IJ SOLN
INTRAMUSCULAR | Status: AC
  Filled 2022-01-10: qty 2

## 2022-01-10 MED ORDER — ONDANSETRON HCL 4 MG/2ML IJ SOLN: 4.0000 mg | Freq: Once | INTRAMUSCULAR | Status: DC | PRN

## 2022-01-10 MED ORDER — DEXAMETHASONE SODIUM PHOSPHATE 10 MG/ML IJ SOLN
INTRAMUSCULAR | Status: DC | PRN
  Administered 2022-01-10: 10 mg via INTRAVENOUS

## 2022-01-10 MED ORDER — SUGAMMADEX SODIUM 200 MG/2ML IV SOLN
INTRAVENOUS | Status: DC | PRN
  Administered 2022-01-10: 200 mg via INTRAVENOUS

## 2022-01-10 MED ORDER — OXYCODONE HCL 5 MG/5ML PO SOLN: 5.0000 mg | Freq: Once | ORAL | Status: AC | PRN

## 2022-01-10 MED ORDER — OXYCODONE HCL 5 MG PO TABS
ORAL_TABLET | ORAL | Status: AC
  Filled 2022-01-10: qty 1

## 2022-01-10 MED ORDER — ONDANSETRON HCL 4 MG/2ML IJ SOLN
INTRAMUSCULAR | Status: DC | PRN
  Administered 2022-01-10: 4 mg via INTRAVENOUS

## 2022-01-10 MED ORDER — HYDROMORPHONE HCL 1 MG/ML IJ SOLN: 0.2500 mg | INTRAMUSCULAR | Status: DC | PRN

## 2022-01-10 SURGICAL SUPPLY — 25 items
BAG COUNTER SPONGE SURGICOUNT (BAG) ×1 IMPLANT
CANISTER SUCT 3000ML PPV (MISCELLANEOUS) ×1 IMPLANT
CATH ROBINSON RED A/P 10FR (CATHETERS) ×1 IMPLANT
CLEANER TIP ELECTROSURG 2X2 (MISCELLANEOUS) ×1 IMPLANT
COAGULATOR SUCT SWTCH 10FR 6 (ELECTROSURGICAL) ×1 IMPLANT
ELECT COATED BLADE 2.86 ST (ELECTRODE) ×1 IMPLANT
ELECT REM PT RETURN 9FT ADLT (ELECTROSURGICAL) ×1
ELECTRODE REM PT RTRN 9FT ADLT (ELECTROSURGICAL) IMPLANT
GAUZE 4X4 16PLY ~~LOC~~+RFID DBL (SPONGE) ×1 IMPLANT
GLOVE ECLIPSE 7.5 STRL STRAW (GLOVE) ×1 IMPLANT
GOWN STRL REUS W/ TWL LRG LVL3 (GOWN DISPOSABLE) ×2 IMPLANT
GOWN STRL REUS W/TWL LRG LVL3 (GOWN DISPOSABLE) ×2
KIT BASIN OR (CUSTOM PROCEDURE TRAY) ×1 IMPLANT
KIT TURNOVER KIT B (KITS) ×1 IMPLANT
NS IRRIG 1000ML POUR BTL (IV SOLUTION) ×1 IMPLANT
PACK BASIC III (CUSTOM PROCEDURE TRAY) ×1
PACK SRG BSC III STRL LF ECLPS (CUSTOM PROCEDURE TRAY) ×1 IMPLANT
PAD ARMBOARD 7.5X6 YLW CONV (MISCELLANEOUS) ×2 IMPLANT
PENCIL FOOT CONTROL (ELECTRODE) ×1 IMPLANT
SPECIMEN JAR SMALL (MISCELLANEOUS) ×2 IMPLANT
SYR BULB EAR ULCER 3OZ GRN STR (SYRINGE) ×1 IMPLANT
TOWEL GREEN STERILE FF (TOWEL DISPOSABLE) ×2 IMPLANT
TUBE CONNECTING 12X1/4 (SUCTIONS) ×1 IMPLANT
TUBE SALEM SUMP 16 FR W/ARV (TUBING) IMPLANT
WATER STERILE IRR 1000ML POUR (IV SOLUTION) ×1 IMPLANT

## 2022-01-10 NOTE — Discharge Instructions (Signed)
For pain:  Use 600 mg ibuprofen every 6 hours. In addition, may use 1000 mg of Tylenol every 6 hours or the prescription liquid Tylenol with hydrocodone every 6 hours.  The 2 medicines can be given at the same time every 6 hours or alternating every 3 hours.

## 2022-01-10 NOTE — Op Note (Signed)
01/10/2022  9:13 AM  PATIENT:  Shannon Navarro  66 y.o. female  PRE-OPERATIVE DIAGNOSIS:  Tonsillar hypertrophy-asymmetry  POST-OPERATIVE DIAGNOSIS:  Tonsillar hypertrophy-asymmetry  PROCEDURE:  Procedure(s): TONSILLECTOMY  SURGEON:  Surgeon(s): Izora Gala, MD  ANESTHESIA:   General  COUNTS: Correct   DICTATION: The patient was taken to the operating room and placed on the operating table in the supine position. Following induction of general endotracheal anesthesia, the table was turned and the patient was draped in a standard fashion. A Crowe-Davis mouthgag was inserted into the oral cavity and used to retract the tongue and mandible, then attached to the Mayo stand.  The tonsillectomy was then performed using electrocautery dissection, carefully dissecting the avascular plane between the capsule and constrictor muscles. Cautery was used for completion of hemostasis. The tonsils were asymmetrically enlarged, left side larger than the right with a 1 cm indurated area in the substance of the tonsil on the left side inferiorly.  They were separately identified and sent for pathologic evaluation.   The pharynx was irrigated with saline and suctioned. An oral gastric tube was used to aspirate the contents of the stomach. The patient was then awakened from anesthesia and transferred to PACU in stable condition.   PATIENT DISPOSITION:  To PACU, stable

## 2022-01-10 NOTE — Anesthesia Procedure Notes (Signed)
Procedure Name: Intubation Date/Time: 01/10/2022 9:01 AM  Performed by: Darletta Moll, CRNAPre-anesthesia Checklist: Patient identified, Emergency Drugs available, Suction available and Patient being monitored Patient Re-evaluated:Patient Re-evaluated prior to induction Oxygen Delivery Method: Circle system utilized Preoxygenation: Pre-oxygenation with 100% oxygen Induction Type: IV induction Ventilation: Mask ventilation without difficulty Laryngoscope Size: Glidescope and 3 Grade View: Grade I Tube type: Oral Tube size: 7.0 mm Number of attempts: 1 Airway Equipment and Method: Video-laryngoscopy and Rigid stylet Placement Confirmation: ETT inserted through vocal cords under direct vision, positive ETCO2 and breath sounds checked- equal and bilateral Secured at: 21 cm Tube secured with: Tape Dental Injury: Teeth and Oropharynx as per pre-operative assessment

## 2022-01-10 NOTE — Anesthesia Postprocedure Evaluation (Signed)
Anesthesia Post Note  Patient: Shannon Navarro  Procedure(s) Performed: TONSILLECTOMY (Bilateral)     Patient location during evaluation: PACU Anesthesia Type: General Level of consciousness: awake and alert, oriented and patient cooperative Pain management: pain level controlled Vital Signs Assessment: post-procedure vital signs reviewed and stable Respiratory status: spontaneous breathing, nonlabored ventilation and respiratory function stable Cardiovascular status: blood pressure returned to baseline and stable Postop Assessment: no apparent nausea or vomiting Anesthetic complications: no   No notable events documented.  Last Vitals:  Vitals:   01/10/22 0945 01/10/22 1000  BP: 132/79 (!) 142/64  Pulse: (!) 43 (!) 44  Resp: 14 13  Temp:    SpO2: 99% 98%    Last Pain:  Vitals:   01/10/22 1000  TempSrc:   PainSc: 0-No pain                 Pervis Hocking

## 2022-01-10 NOTE — Transfer of Care (Signed)
Immediate Anesthesia Transfer of Care Note  Patient: Shannon Navarro  Procedure(s) Performed: TONSILLECTOMY (Bilateral)  Patient Location: PACU  Anesthesia Type:General  Level of Consciousness: drowsy and patient cooperative  Airway & Oxygen Therapy: Patient Spontanous Breathing  Post-op Assessment: Report given to RN, Post -op Vital signs reviewed and stable and Patient moving all extremities X 4  Post vital signs: Reviewed and stable  Last Vitals:  Vitals Value Taken Time  BP 127/104 01/10/22 0922  Temp    Pulse 52 01/10/22 0924  Resp 13 01/10/22 0924  SpO2 100 % 01/10/22 0924  Vitals shown include unvalidated device data.  Last Pain:  Vitals:   01/10/22 0640  TempSrc:   PainSc: 0-No pain         Complications: No notable events documented.

## 2022-01-10 NOTE — Interval H&P Note (Signed)
History and Physical Interval Note:  01/10/2022 8:06 AM  Shannon Navarro  has presented today for surgery, with the diagnosis of Tonsillar hypertrophy.  The various methods of treatment have been discussed with the patient and family. After consideration of risks, benefits and other options for treatment, the patient has consented to  Procedure(s): TONSILLECTOMY (Bilateral) as a surgical intervention.  The patient's history has been reviewed, patient examined, no change in status, stable for surgery.  I have reviewed the patient's chart and labs.  Questions were answered to the patient's satisfaction.     Izora Gala

## 2022-01-11 ENCOUNTER — Encounter (HOSPITAL_COMMUNITY): Payer: Self-pay | Admitting: Otolaryngology

## 2022-01-14 LAB — SURGICAL PATHOLOGY

## 2022-01-15 NOTE — Discharge Summary (Signed)
Physician Discharge Summary  Patient ID: Shannon Navarro MRN: [id number] DOB/AGE: July 14, 1955 66 y.o.  Admit date: 01/10/2022 Discharge date: 01/15/2022  Admission Diagnoses:tonsil asymmetry  Discharge Diagnoses:  Principal Problem:   Tonsillar enlargement   Discharged Condition: good  Hospital Course: no complications  Consults: none  Significant Diagnostic Studies: none  Treatments: surgery: tonsillectomy  Discharge Exam: Blood pressure 136/64, pulse (!) 48, temperature 97.7 F (36.5 C), resp. rate 15, height '5\' 6"'$  (1.676 m), weight 104.3 kg, SpO2 97 %. PHYSICAL EXAM: Awake and alert, no bleeding, taking po well.  Disposition: Discharge disposition: 01-Home or Self Care       Discharge Instructions     Diet - low sodium heart healthy   Complete by: As directed    Increase activity slowly   Complete by: As directed       Allergies as of 01/10/2022       Reactions   Cefaclor Rash   Codeine Nausea And Vomiting   Hydrocodone Nausea And Vomiting   Severe vomiting/  COUGH SYRUP CAUSE SEVERE VOMITING   Levaquin [levofloxacin] Nausea Only   Nexium [esomeprazole Magnesium] Other (See Comments)   Made legs ache   Penicillins Rash   Sulfa Antibiotics Rash        Medication List     TAKE these medications    amLODipine 5 MG tablet Commonly known as: NORVASC Take 1 tablet (5 mg total) by mouth daily.   cetirizine 10 MG tablet Commonly known as: ZYRTEC Take 10 mg by mouth daily.   fluticasone 50 MCG/ACT nasal spray Commonly known as: FLONASE Place 2 sprays into both nostrils daily. What changed:  when to take this reasons to take this   HYDROcodone-acetaminophen 7.5-325 mg/15 ml solution Commonly known as: HYCET Take 15 mLs by mouth every 6 (six) hours as needed for moderate pain.   losartan 100 MG tablet Commonly known as: COZAAR Take 1 tablet (100 mg total) by mouth daily.   ondansetron 4 MG disintegrating tablet Commonly known as:  ZOFRAN-ODT Take 1 tablet (4 mg total) by mouth every 8 (eight) hours as needed for nausea or vomiting.   pantoprazole 40 MG tablet Commonly known as: PROTONIX TAKE 1 TABLET BY MOUTH TWICE A DAY BEFORE A MEAL   propranolol 20 MG tablet Commonly known as: INDERAL Take 1 tablet by mouth three times daily as needed   venlafaxine XR 75 MG 24 hr capsule Commonly known as: Effexor XR Take 1 capsule (75 mg total) by mouth daily with breakfast.   Vitamin D 125 MCG (5000 UT) Caps Take 5,000 Units by mouth daily.         Signed: Izora Gala 01/15/2022, 8:57 AM

## 2022-02-03 DIAGNOSIS — M6281 Muscle weakness (generalized): Secondary | ICD-10-CM | POA: Diagnosis not present

## 2022-02-03 DIAGNOSIS — M4322 Fusion of spine, cervical region: Secondary | ICD-10-CM | POA: Diagnosis not present

## 2022-02-03 DIAGNOSIS — M542 Cervicalgia: Secondary | ICD-10-CM | POA: Diagnosis not present

## 2022-02-06 DIAGNOSIS — M4322 Fusion of spine, cervical region: Secondary | ICD-10-CM | POA: Diagnosis not present

## 2022-02-06 DIAGNOSIS — M6281 Muscle weakness (generalized): Secondary | ICD-10-CM | POA: Diagnosis not present

## 2022-02-10 DIAGNOSIS — M4322 Fusion of spine, cervical region: Secondary | ICD-10-CM | POA: Diagnosis not present

## 2022-02-10 DIAGNOSIS — M6281 Muscle weakness (generalized): Secondary | ICD-10-CM | POA: Diagnosis not present

## 2022-02-17 ENCOUNTER — Encounter: Payer: Self-pay | Admitting: Physician Assistant

## 2022-02-17 ENCOUNTER — Ambulatory Visit (INDEPENDENT_AMBULATORY_CARE_PROVIDER_SITE_OTHER): Payer: Medicare Other | Admitting: Physician Assistant

## 2022-02-17 ENCOUNTER — Other Ambulatory Visit (INDEPENDENT_AMBULATORY_CARE_PROVIDER_SITE_OTHER): Payer: Medicare Other

## 2022-02-17 VITALS — BP 140/62 | HR 53 | Ht 66.0 in | Wt 232.4 lb

## 2022-02-17 DIAGNOSIS — R1013 Epigastric pain: Secondary | ICD-10-CM

## 2022-02-17 DIAGNOSIS — Z86010 Personal history of colonic polyps: Secondary | ICD-10-CM | POA: Diagnosis not present

## 2022-02-17 DIAGNOSIS — K5904 Chronic idiopathic constipation: Secondary | ICD-10-CM | POA: Diagnosis not present

## 2022-02-17 DIAGNOSIS — K739 Chronic hepatitis, unspecified: Secondary | ICD-10-CM

## 2022-02-17 DIAGNOSIS — K219 Gastro-esophageal reflux disease without esophagitis: Secondary | ICD-10-CM

## 2022-02-17 DIAGNOSIS — R7989 Other specified abnormal findings of blood chemistry: Secondary | ICD-10-CM

## 2022-02-17 LAB — COMPREHENSIVE METABOLIC PANEL
ALT: 109 U/L — ABNORMAL HIGH (ref 0–35)
AST: 24 U/L (ref 0–37)
Albumin: 4 g/dL (ref 3.5–5.2)
Alkaline Phosphatase: 102 U/L (ref 39–117)
BUN: 14 mg/dL (ref 6–23)
CO2: 27 mEq/L (ref 19–32)
Calcium: 9.5 mg/dL (ref 8.4–10.5)
Chloride: 107 mEq/L (ref 96–112)
Creatinine, Ser: 0.71 mg/dL (ref 0.40–1.20)
GFR: 89.49 mL/min (ref >60.00)
Glucose, Bld: 98 mg/dL (ref 70–99)
Potassium: 3.7 mEq/L (ref 3.5–5.1)
Sodium: 143 mEq/L (ref 135–145)
Total Bilirubin: 0.9 mg/dL (ref 0.2–1.2)
Total Protein: 7.4 g/dL (ref 6.0–8.3)

## 2022-02-17 LAB — CBC WITH DIFFERENTIAL/PLATELET
Basophils Absolute: 0.1 10*3/uL (ref 0.0–0.1)
Basophils Relative: 0.7 % (ref 0.0–3.0)
Eosinophils Absolute: 0.2 10*3/uL (ref 0.0–0.7)
Eosinophils Relative: 3.3 % (ref 0.0–5.0)
HCT: 41.2 % (ref 36.0–46.0)
Hemoglobin: 13.7 g/dL (ref 12.0–15.0)
Lymphocytes Relative: 17.4 % (ref 12.0–46.0)
Lymphs Abs: 1.2 10*3/uL (ref 0.7–4.0)
MCHC: 33.3 g/dL (ref 30.0–36.0)
MCV: 85.6 fl (ref 78.0–100.0)
Monocytes Absolute: 0.6 10*3/uL (ref 0.1–1.0)
Monocytes Relative: 8 % (ref 3.0–12.0)
Neutro Abs: 5 10*3/uL (ref 1.4–7.7)
Neutrophils Relative %: 70.6 % (ref 43.0–77.0)
Platelets: 251 10*3/uL (ref 150.0–400.0)
RBC: 4.81 Mil/uL (ref 3.87–5.11)
RDW: 13.8 % (ref 11.5–15.5)
WBC: 7.1 10*3/uL (ref 4.0–10.5)

## 2022-02-17 LAB — LIPASE: Lipase: 32 U/L (ref 11.0–59.0)

## 2022-02-17 MED ORDER — FDGARD 25-20.75 MG PO CAPS: ORAL_CAPSULE | ORAL | 0 refills | Status: DC

## 2022-02-17 NOTE — Progress Notes (Signed)
02/17/2022 Shannon Navarro [id number] [birth year]  Referring provider: [name], * Primary GI doctor: [name]  ASSESSMENT AND PLAN:   Dyspepsia with history of GERD 12/03/2020 endoscopy for dysphagia showed benign-appearing esophageal stenosis dilated, normal stomach, normal duodenum. Has had 30 lbs weight loss intentional, 3 episodes of AB pain last 20-30 mins after food- check RUQ [place] consider HIDA if negative Check lipase Lifestyle changes of GERD discussed, avoid NSAIDS, ETOH Take PPI daily for at least 6-8 weeks, not just as needed.  Check h pylori  AB bloating with constipation Check for celiac.  Will treat constipation with fiber and miralax Given FODMAP information and discussed diet, possible component of IBS If symptoms don't improve can consider SIBO test for methane with recent ABX use  History of colonic polyps 06/25/2020 colonoscopy 5 mm benign polyp descending colon diverticula sigmoid and descending colon nonbleeding internal hemorrhoids medium size recall 10 years   History of Present Illness:  66 y.o. female  with a past medical history of GERD, hiatal hernia, status post dilatation, fatty liver, unremarkable stress test 2017, OSA on CPAP, A-fib follows with Dr. Percival Shannon Navarro, history of pseudotumor cerebri and others listed below, returns to clinic today for evaluation of abdominal bloating, epigastric pain, constiapation.  06/25/2020 endoscopy benign-appearing intrinsic moderate stenosis 34 to 35 cm dilated 18 mm mild hiatal hernia, otherwise unremarkable 06/25/2020 colonoscopy 5 mm benign polyp descending colon diverticula sigmoid and descending colon nonbleeding internal hemorrhoids medium size recall 10 years 09/14/20 office visit with [name] for dysphagia with subsequent endoscopy 12/03/2020 endoscopy for dysphagia showed benign-appearing esophageal stenosis dilated, normal stomach, normal duodenum.  Her dad passed May 11, had to have  cervical fusion June 15th.  She states for several days after the surgery she has had abnormal stomach issues since that time.  She also had tonsillectomy in Sept.  She had sore throat, since May, swelling went away but had swelling and white spot, took 4 different ABX, had asymmetric tonsils on ENT so had tonsillectomy, no cancer.  States she has had worsening anxiety with all of this.   She was not good at taking protonix daily, so started to take it daily the last week that may have helped symptoms some   She states she has never had episodes like this, will have epigastric squeezing AB pain that radiates around to her back. She had one last week and 2 the week prior.  Always happens after food, worse lying down, no associated symptoms like nausea, vomiting. Last time she did have BM and this helped.  She has had some constipation lately, has gotten benefiber but not started. She states if she gets hungry, she will have more nausea.  She will have a ton of belching, burping and some discomfort after eating.  No family history of GB issues.  Rare swallowing issues, comes and goes.  She has lost some weight, trying to lose weight, lost about 30 lbs, has gained about 5 lbs.  No fever, chills.   She  reports that she has never smoked. She has never used smokeless tobacco. She reports that she does not drink alcohol and does not use drugs. Her family history includes Aortic stenosis in her father; Asthma in her mother; Breast cancer in her maternal aunt, maternal aunt, mother, and niece; Colon cancer in an other family member; Colon polyps in her father; Diabetes in her father; Esophageal cancer in an other family member; Heart disease in her father; Hypertension in  her father and mother; Prostate cancer in her father; Stomach cancer in an other family member.   Current Medications:    Current Outpatient Medications (Cardiovascular):    amLODipine (NORVASC) 5 MG tablet, Take 1 tablet (5 mg  total) by mouth daily.   losartan (COZAAR) 100 MG tablet, Take 1 tablet (100 mg total) by mouth daily.   propranolol (INDERAL) 20 MG tablet, Take 1 tablet by mouth three times daily as needed  Current Outpatient Medications (Respiratory):    cetirizine (ZYRTEC) 10 MG tablet, Take 10 mg by mouth daily.   fluticasone (FLONASE) 50 MCG/ACT nasal spray, Place 2 sprays into both nostrils daily. (Patient taking differently: Place 2 sprays into both nostrils daily as needed for allergies.)  Current Outpatient Medications (Analgesics):    HYDROcodone-acetaminophen (HYCET) 7.5-325 mg/15 ml solution, Take 15 mLs by mouth every 6 (six) hours as needed for moderate pain. (Patient not taking: Reported on 02/17/2022)   Current Outpatient Medications (Other):    Cholecalciferol (VITAMIN D) 125 MCG (5000 UT) CAPS, Take 5,000 Units by mouth daily.   pantoprazole (PROTONIX) 40 MG tablet, TAKE 1 TABLET BY MOUTH TWICE A DAY BEFORE A MEAL   ondansetron (ZOFRAN-ODT) 4 MG disintegrating tablet, Take 1 tablet (4 mg total) by mouth every 8 (eight) hours as needed for nausea or vomiting. (Patient not taking: Reported on 02/17/2022)   venlafaxine XR (EFFEXOR XR) 75 MG 24 hr capsule, Take 1 capsule (75 mg total) by mouth daily with breakfast. (Patient not taking: Reported on 01/06/2022)  Surgical History:  She  has a past surgical history that includes Dilation and curettage of uterus (2010); Colonoscopy; Upper gastrointestinal endoscopy (11/2020); transthoracic echocardiogram (03/11/2016); Tubal ligation; colonscopy (2007); right knee arthroscopy (03/2013); Dilatation & curettage/hysteroscopy with myosure (N/A, 03/26/2017); Laparoscopic hysterectomy (Bilateral, 06/18/2017); Breast biopsy (Right, 08/12/2019); Fracture surgery; Abdominal hysterectomy; Anterior cervical decompression/discectomy fusion 4 level (N/A, 10/24/2021); torn meniscus surgery on right knee ; and Tonsillectomy (Bilateral, 01/10/2022).  Current Medications,  Allergies, Past Medical History, Past Surgical History, Family History and Social History were reviewed in [employer] record.  Physical Exam: BP (!) 140/62   Pulse (!) 53   Ht '5\' 6"'$  (1.676 m)   Wt 232 lb 6.4 oz (105.4 kg)   SpO2 97%   BMI 37.51 kg/m  General:   Pleasant, well developed female in no acute distress Heart : Regular rate and rhythm; no murmurs Pulm: Clear anteriorly; no wheezing Abdomen:  Soft, Obese AB, Active bowel sounds. mild tenderness in the epigastrium. Without guarding and Without rebound, No organomegaly appreciated. Rectal: Not evaluated Extremities:  without  edema. Neurologic:  Alert and  oriented x4;  No focal deficits.  Psych:  Cooperative. Normal mood and affect.   Vladimir Crofts, PA-C 02/17/22

## 2022-02-17 NOTE — Patient Instructions (Signed)
Your provider has requested that you go to the basement level for lab work before leaving today. Press "B" on the elevator. The lab is located at the first door on the left as you exit the elevator.  ________________________________________________________________________  Shannon Navarro have been scheduled for an abdominal ultrasound at [facility] (1st floor of hospital) on Friday, 10-13 at 9:30 am. Please arrive 30 minutes prior to your appointment for registration. Make certain not to have anything to eat or drink 6 hours prior to your appointment. Should you need to reschedule your appointment, please contact radiology at [phone]. This test typically takes about 30 minutes to perform.  ________________________________________________________________________   Please take your proton pump inhibitor medication, protonix daily for 1-2 months  Please take this medication 30 minutes to 1 hour before meals- this makes it more effective.  Avoid spicy and acidic foods Avoid fatty foods Limit your intake of coffee, tea, alcohol, and carbonated drinks Work to maintain a healthy weight Keep the head of the bed elevated at least 3 inches with blocks or a wedge pillow if you are having any nighttime symptoms Stay upright for 2 hours after eating Avoid meals and snacks three to four hours before bedtime  We have given you samples of the following medication to take: Will do trial of FDgard daily- samples given- can get over the counter.   We may want to evaluate you for small intestinal bacterial overgrowth, this can cause increase gas, bloating, loose stools or constipation.  There is a test for this we can do or sometimes we will treat a patient with an antibiotic to see if it helps.    FIBER SUPPLEMENT You can do metamucil or fibercon once or twice a day but if this causes gas/bloating please switch to Benefiber or Citracel.  Fiber is good for constipation/diarrhea/irritable bowel syndrome.   It can also help with weight loss and can help lower your bad cholesterol (LDL).  Please do 1 TBSP in the morning in water, coffee, or tea.  It can take up to a month before you can see a difference with your bowel movements.  It is cheapest from [employer], [employer], [employer].   Toileting tips to help with your constipation - Drink at least 64-80 ounces of water/liquid per day. - Establish a time to try to move your bowels every day.  For many people, this is after a cup of coffee or after a meal such as breakfast. - Sit all of the way back on the toilet keeping your back fairly straight and while sitting up, try to rest the tops of your forearms on your upper thighs.   - Raising your feet with a step stool/squatty potty can be helpful to improve the angle that allows your stool to pass through the rectum. - Relax the rectum feeling it bulge toward the toilet water.  If you feel your rectum raising toward your body, you are contracting rather than relaxing. - Breathe in and slowly exhale. "Belly breath" by expanding your belly towards your belly button. Keep belly expanded as you gently direct pressure down and back to the anus.  A low pitched GRRR sound can assist with increasing intra-abdominal pressure.  - Repeat 3-4 times. If unsuccessful, contract the pelvic floor to restore normal tone and get off the toilet.  Avoid excessive straining. - To reduce excessive wiping by teaching your anus to normally contract, place hands on outer aspect of knees and resist knee movement outward.  Hold 5-10  second then place hands just inside of knees and resist inward movement of knees.  Hold 5 seconds.  Repeat a few times each way.     FODMAP stands for fermentable oligo-, di-, mono-saccharides and polyols (1). These are the scientific terms used to classify groups of carbs that are notorious for triggering digestive symptoms like bloating, gas and stomach pain.       Diverticulosis Diverticulosis is a  condition that develops when small pouches (diverticula) form in the wall of the large intestine (colon). The colon is where water is absorbed and stool (feces) is formed. The pouches form when the inside layer of the colon pushes through weak spots in the outer layers of the colon. You may have a few pouches or many of them. The pouches usually do not cause problems unless they become inflamed or infected. When this happens, the condition is called diverticulitis- this is left lower quadrant pain, diarrhea, fever, chills, nausea or vomiting.  If this occurs please call the office or go to the hospital. Sometimes these patches without inflammation can also have painless bleeding associated with them, if this happens please call the office or go to the hospital. Preventing constipation and increasing fiber can help reduce diverticula and prevent complications. Even if you feel you have a high-fiber diet, suggest getting on Benefiber or Cirtracel 2 times daily.  Thank you for entrusting me with your care and for choosing [facility], Vicie Mutters, P.A.-C

## 2022-02-18 LAB — TISSUE TRANSGLUTAMINASE, IGA: (tTG) Ab, IgA: <1 U/mL

## 2022-02-18 LAB — IGA: Immunoglobulin A: 284 mg/dL (ref 70–320)

## 2022-02-21 ENCOUNTER — Ambulatory Visit (HOSPITAL_COMMUNITY)
Admission: RE | Admit: 2022-02-21 | Discharge: 2022-02-21 | Disposition: A | Discharge status: Home / Self Care | Payer: Medicare Other | Source: Ambulatory Visit | Attending: Physician Assistant | Admitting: Physician Assistant

## 2022-02-21 DIAGNOSIS — R7989 Other specified abnormal findings of blood chemistry: Secondary | ICD-10-CM | POA: Diagnosis present

## 2022-02-21 DIAGNOSIS — R1013 Epigastric pain: Secondary | ICD-10-CM | POA: Insufficient documentation

## 2022-02-24 ENCOUNTER — Other Ambulatory Visit: Payer: Self-pay

## 2022-02-24 ENCOUNTER — Other Ambulatory Visit: Payer: Medicare Other

## 2022-02-24 DIAGNOSIS — R9389 Abnormal findings on diagnostic imaging of other specified body structures: Secondary | ICD-10-CM

## 2022-02-24 DIAGNOSIS — R7989 Other specified abnormal findings of blood chemistry: Secondary | ICD-10-CM

## 2022-02-24 DIAGNOSIS — R1013 Epigastric pain: Secondary | ICD-10-CM

## 2022-02-25 ENCOUNTER — Other Ambulatory Visit (INDEPENDENT_AMBULATORY_CARE_PROVIDER_SITE_OTHER): Payer: Medicare Other

## 2022-02-25 DIAGNOSIS — R7989 Other specified abnormal findings of blood chemistry: Secondary | ICD-10-CM

## 2022-02-25 DIAGNOSIS — K739 Chronic hepatitis, unspecified: Secondary | ICD-10-CM | POA: Diagnosis not present

## 2022-02-25 LAB — PROTIME-INR
INR: 1.2 ratio — ABNORMAL HIGH (ref 0.8–1.0)
Prothrombin Time: 12.7 s (ref 9.6–13.1)

## 2022-02-25 LAB — HEPATIC FUNCTION PANEL
ALT: 20 U/L (ref 0–35)
AST: 14 U/L (ref 0–37)
Albumin: 4.1 g/dL (ref 3.5–5.2)
Alkaline Phosphatase: 88 U/L (ref 39–117)
Bilirubin, Direct: 0.2 mg/dL (ref 0.0–0.3)
Total Bilirubin: 1 mg/dL (ref 0.2–1.2)
Total Protein: 7.2 g/dL (ref 6.0–8.3)

## 2022-02-25 LAB — HELICOBACTER PYLORI  SPECIAL ANTIGEN
MICRO NUMBER:: 14055510
SPECIMEN QUALITY: ADEQUATE

## 2022-02-28 LAB — ANTI-NUCLEAR AB-TITER (ANA TITER): ANA Titer 1: 1:40 {titer} — ABNORMAL HIGH

## 2022-02-28 LAB — HEPATITIS C ANTIBODY: Hepatitis C Ab: NONREACTIVE

## 2022-02-28 LAB — HEPATITIS A ANTIBODY, TOTAL: Hepatitis A AB,Total: NONREACTIVE

## 2022-02-28 LAB — ANTI-SMOOTH MUSCLE ANTIBODY, IGG: Actin (Smooth Muscle) Antibody (IGG): <20 U (ref <20)

## 2022-02-28 LAB — IGG: IgG (Immunoglobin G), Serum: 1293 mg/dL (ref 600–1540)

## 2022-02-28 LAB — HEPATITIS B SURFACE ANTIGEN: Hepatitis B Surface Ag: NONREACTIVE

## 2022-02-28 LAB — ANA: Anti Nuclear Antibody (ANA): POSITIVE — AB

## 2022-02-28 LAB — HEPATITIS B CORE ANTIBODY, TOTAL: Hep B Core Total Ab: NONREACTIVE

## 2022-02-28 LAB — MITOCHONDRIAL ANTIBODIES: Mitochondrial M2 Ab, IgG: <=20 U (ref <=20.0)

## 2022-03-07 ENCOUNTER — Encounter (HOSPITAL_COMMUNITY)
Admission: RE | Admit: 2022-03-07 | Discharge: 2022-03-07 | Disposition: A | Discharge status: Home / Self Care | Payer: Medicare Other | Source: Ambulatory Visit | Attending: Physician Assistant | Admitting: Physician Assistant

## 2022-03-07 DIAGNOSIS — R9389 Abnormal findings on diagnostic imaging of other specified body structures: Secondary | ICD-10-CM | POA: Insufficient documentation

## 2022-03-07 DIAGNOSIS — R7989 Other specified abnormal findings of blood chemistry: Secondary | ICD-10-CM

## 2022-03-07 MED ORDER — TECHNETIUM TC 99M MEBROFENIN IV KIT
5.3000 | PACK | Freq: Once | INTRAVENOUS | Status: AC
  Administered 2022-03-07: 5.3 via INTRAVENOUS

## 2022-03-10 ENCOUNTER — Ambulatory Visit: Payer: Medicare Other | Admitting: Internal Medicine

## 2022-03-11 ENCOUNTER — Telehealth: Payer: Self-pay | Admitting: Physician Assistant

## 2022-03-11 NOTE — Telephone Encounter (Signed)
Refer to imaging result note 03/07/22.

## 2022-03-13 ENCOUNTER — Ambulatory Visit (INDEPENDENT_AMBULATORY_CARE_PROVIDER_SITE_OTHER): Payer: Medicare Other | Admitting: Internal Medicine

## 2022-03-13 ENCOUNTER — Encounter: Payer: Self-pay | Admitting: Internal Medicine

## 2022-03-13 VITALS — BP 128/60 | HR 92 | Temp 98.0°F | Ht 66.0 in | Wt 226.5 lb

## 2022-03-13 DIAGNOSIS — I10 Essential (primary) hypertension: Secondary | ICD-10-CM | POA: Diagnosis not present

## 2022-03-13 DIAGNOSIS — E559 Vitamin D deficiency, unspecified: Secondary | ICD-10-CM

## 2022-03-13 DIAGNOSIS — E049 Nontoxic goiter, unspecified: Secondary | ICD-10-CM

## 2022-03-13 DIAGNOSIS — R7989 Other specified abnormal findings of blood chemistry: Secondary | ICD-10-CM | POA: Diagnosis not present

## 2022-03-13 DIAGNOSIS — Z23 Encounter for immunization: Secondary | ICD-10-CM

## 2022-03-13 LAB — MAGNESIUM: Magnesium: 2 mg/dL (ref 1.5–2.5)

## 2022-03-13 LAB — COMPREHENSIVE METABOLIC PANEL
ALT: 49 U/L — ABNORMAL HIGH (ref 0–35)
AST: 65 U/L — ABNORMAL HIGH (ref 0–37)
Albumin: 3.9 g/dL (ref 3.5–5.2)
Alkaline Phosphatase: 82 U/L (ref 39–117)
BUN: 17 mg/dL (ref 6–23)
CO2: 29 mEq/L (ref 19–32)
Calcium: 9.4 mg/dL (ref 8.4–10.5)
Chloride: 107 mEq/L (ref 96–112)
Creatinine, Ser: 0.67 mg/dL (ref 0.40–1.20)
GFR: 91.95 mL/min (ref >60.00)
Glucose, Bld: 110 mg/dL — ABNORMAL HIGH (ref 70–99)
Potassium: 4.1 mEq/L (ref 3.5–5.1)
Sodium: 143 mEq/L (ref 135–145)
Total Bilirubin: 0.9 mg/dL (ref 0.2–1.2)
Total Protein: 7.1 g/dL (ref 6.0–8.3)

## 2022-03-13 LAB — VITAMIN D 25 HYDROXY (VIT D DEFICIENCY, FRACTURES): VITD: 60.19 ng/mL (ref 30.00–100.00)

## 2022-03-13 NOTE — Progress Notes (Signed)
   Subjective:   Patient ID: Shannon Navarro, female    DOB: Aug 13, 1955, 66 y.o.   MRN: [id number]  HPI The patient is a 66 YO female coming in for thyroid enlargement and feeling a nodule close to there. Recent tonsillectomy and possible gallbladder surgery in the near future.   Review of Systems  Constitutional: Negative.   HENT:  Positive for trouble swallowing.   Eyes: Negative.   Respiratory:  Negative for cough, chest tightness and shortness of breath.   Cardiovascular:  Negative for chest pain, palpitations and leg swelling.  Gastrointestinal:  Negative for abdominal distention, abdominal pain, constipation, diarrhea, nausea and vomiting.  Musculoskeletal: Negative.   Skin: Negative.   Neurological: Negative.   Psychiatric/Behavioral: Negative.      Objective:  Physical Exam Constitutional:      Appearance: She is well-developed.  HENT:     Head: Normocephalic and atraumatic.  Neck:     Comments: thyromegaly Cardiovascular:     Rate and Rhythm: Normal rate and regular rhythm.  Pulmonary:     Effort: Pulmonary effort is normal. No respiratory distress.     Breath sounds: Normal breath sounds. No wheezing or rales.  Abdominal:     General: Bowel sounds are normal. There is no distension.     Palpations: Abdomen is soft.     Tenderness: There is no abdominal tenderness. There is no rebound.  Musculoskeletal:     Cervical back: Normal range of motion.  Skin:    General: Skin is warm and dry.  Neurological:     Mental Status: She is alert and oriented to person, place, and time.     Coordination: Coordination normal.     Vitals:   03/13/22 0826  BP: 128/60  Pulse: 92  Temp: 98 F (36.7 C)  TempSrc: Oral  SpO2: 99%  Weight: 226 lb 8 oz (102.7 kg)  Height: '5\' 6"'$  (1.676 m)    Assessment & Plan:  Prevnar 20 given at visit

## 2022-03-13 NOTE — Patient Instructions (Addendum)
We will get an ultrasound of the thyroid to check.  We will have you stop the amlodipine.   We will recheck the labs.

## 2022-03-14 ENCOUNTER — Encounter: Payer: Self-pay | Admitting: Internal Medicine

## 2022-03-14 DIAGNOSIS — R7989 Other specified abnormal findings of blood chemistry: Secondary | ICD-10-CM | POA: Insufficient documentation

## 2022-03-14 DIAGNOSIS — E041 Nontoxic single thyroid nodule: Secondary | ICD-10-CM | POA: Insufficient documentation

## 2022-03-14 DIAGNOSIS — E049 Nontoxic goiter, unspecified: Secondary | ICD-10-CM | POA: Insufficient documentation

## 2022-03-14 NOTE — Assessment & Plan Note (Signed)
Checking US thyroid to check if there are nodules and size.

## 2022-03-14 NOTE — Assessment & Plan Note (Signed)
BP running low at home due to weight loss. Will stop amlodipine 5 mg daily and reassess at follow up visit.

## 2022-03-14 NOTE — Assessment & Plan Note (Addendum)
Checking CMP due to elevated levels there is concern biliary spasm is contributing.

## 2022-03-14 NOTE — Assessment & Plan Note (Signed)
Checking vitamin D level as not checked in some time. Taking otc adjust as needed.

## 2022-03-18 ENCOUNTER — Ambulatory Visit
Admission: RE | Admit: 2022-03-18 | Discharge: 2022-03-18 | Disposition: A | Discharge status: Home / Self Care | Payer: Medicare Other | Source: Ambulatory Visit | Attending: Internal Medicine | Admitting: Internal Medicine

## 2022-03-18 DIAGNOSIS — E049 Nontoxic goiter, unspecified: Secondary | ICD-10-CM

## 2022-03-19 ENCOUNTER — Ambulatory Visit (INDEPENDENT_AMBULATORY_CARE_PROVIDER_SITE_OTHER): Payer: Medicare Other

## 2022-03-19 DIAGNOSIS — Z23 Encounter for immunization: Secondary | ICD-10-CM | POA: Diagnosis not present

## 2022-03-19 NOTE — Progress Notes (Signed)
After obtaining consent, and per orders of Dr. Sharlet Salina, injection of High Dose Flu shot given in the left deltoid by Marrian Salvage. Patient instructed  to report any adverse reaction to me immediately.

## 2022-03-21 ENCOUNTER — Other Ambulatory Visit: Payer: Self-pay

## 2022-03-21 ENCOUNTER — Telehealth: Payer: Self-pay

## 2022-03-21 NOTE — Telephone Encounter (Signed)
Hep A & B first injection scheduled with patient for 03/31/22 @ 3:30 pm. Reminder placed for 6 month Hep A injection & patient will schedule second Hep B at her appointment. Orders placed.

## 2022-03-25 ENCOUNTER — Other Ambulatory Visit: Payer: Self-pay

## 2022-03-25 ENCOUNTER — Emergency Department (HOSPITAL_COMMUNITY): Payer: Medicare Other

## 2022-03-25 ENCOUNTER — Emergency Department (HOSPITAL_COMMUNITY)
Admission: EM | Admit: 2022-03-25 | Discharge: 2022-03-25 | Disposition: A | Discharge status: Home / Self Care | Payer: Medicare Other | Attending: Emergency Medicine | Admitting: Emergency Medicine

## 2022-03-25 ENCOUNTER — Encounter (HOSPITAL_COMMUNITY): Payer: Self-pay | Admitting: Emergency Medicine

## 2022-03-25 DIAGNOSIS — R1013 Epigastric pain: Secondary | ICD-10-CM | POA: Diagnosis not present

## 2022-03-25 DIAGNOSIS — R110 Nausea: Secondary | ICD-10-CM | POA: Insufficient documentation

## 2022-03-25 DIAGNOSIS — R001 Bradycardia, unspecified: Secondary | ICD-10-CM | POA: Insufficient documentation

## 2022-03-25 DIAGNOSIS — R42 Dizziness and giddiness: Secondary | ICD-10-CM

## 2022-03-25 DIAGNOSIS — R7989 Other specified abnormal findings of blood chemistry: Secondary | ICD-10-CM

## 2022-03-25 DIAGNOSIS — R7401 Elevation of levels of liver transaminase levels: Secondary | ICD-10-CM | POA: Diagnosis not present

## 2022-03-25 DIAGNOSIS — Z79899 Other long term (current) drug therapy: Secondary | ICD-10-CM | POA: Insufficient documentation

## 2022-03-25 DIAGNOSIS — I10 Essential (primary) hypertension: Secondary | ICD-10-CM | POA: Insufficient documentation

## 2022-03-25 LAB — TSH: TSH: 3.564 u[IU]/mL (ref 0.350–4.500)

## 2022-03-25 LAB — CBC WITH DIFFERENTIAL/PLATELET
Abs Immature Granulocytes: 0.03 10*3/uL (ref 0.00–0.07)
Basophils Absolute: 0 10*3/uL (ref 0.0–0.1)
Basophils Relative: 1 %
Eosinophils Absolute: 0.2 10*3/uL (ref 0.0–0.5)
Eosinophils Relative: 2 %
HCT: 40.4 % (ref 36.0–46.0)
Hemoglobin: 13 g/dL (ref 12.0–15.0)
Immature Granulocytes: 0 %
Lymphocytes Relative: 16 %
Lymphs Abs: 1.3 10*3/uL (ref 0.7–4.0)
MCH: 29 pg (ref 26.0–34.0)
MCHC: 32.2 g/dL (ref 30.0–36.0)
MCV: 90 fL (ref 80.0–100.0)
Monocytes Absolute: 0.5 10*3/uL (ref 0.1–1.0)
Monocytes Relative: 6 %
Neutro Abs: 6.2 10*3/uL (ref 1.7–7.7)
Neutrophils Relative %: 75 %
Platelets: 229 10*3/uL (ref 150–400)
RBC: 4.49 MIL/uL (ref 3.87–5.11)
RDW: 13 % (ref 11.5–15.5)
WBC: 8.3 10*3/uL (ref 4.0–10.5)
nRBC: 0 % (ref 0.0–0.2)

## 2022-03-25 LAB — COMPREHENSIVE METABOLIC PANEL
ALT: 71 U/L — ABNORMAL HIGH (ref 0–44)
AST: 112 U/L — ABNORMAL HIGH (ref 15–41)
Albumin: 3.2 g/dL — ABNORMAL LOW (ref 3.5–5.0)
Alkaline Phosphatase: 85 U/L (ref 38–126)
Anion gap: 13 (ref 5–15)
BUN: 16 mg/dL (ref 8–23)
CO2: 21 mmol/L — ABNORMAL LOW (ref 22–32)
Calcium: 8.9 mg/dL (ref 8.9–10.3)
Chloride: 108 mmol/L (ref 98–111)
Creatinine, Ser: 0.88 mg/dL (ref 0.44–1.00)
GFR, Estimated: >60 mL/min (ref >60)
Glucose, Bld: 157 mg/dL — ABNORMAL HIGH (ref 70–99)
Potassium: 3.9 mmol/L (ref 3.5–5.1)
Sodium: 142 mmol/L (ref 135–145)
Total Bilirubin: 1 mg/dL (ref 0.3–1.2)
Total Protein: 6 g/dL — ABNORMAL LOW (ref 6.5–8.1)

## 2022-03-25 LAB — TROPONIN I (HIGH SENSITIVITY)
Troponin I (High Sensitivity): 3 ng/L (ref <18)
Troponin I (High Sensitivity): 4 ng/L (ref <18)

## 2022-03-25 LAB — T4, FREE: Free T4: 1.04 ng/dL (ref 0.61–1.12)

## 2022-03-25 LAB — LIPASE, BLOOD: Lipase: 36 U/L (ref 11–51)

## 2022-03-25 MED ORDER — SODIUM CHLORIDE 0.9 % IV BOLUS
1000.0000 mL | Freq: Once | INTRAVENOUS | Status: AC
  Administered 2022-03-25: 1000 mL via INTRAVENOUS

## 2022-03-25 NOTE — Discharge Instructions (Signed)
Please be sure to follow-up with your surgeon as scheduled this Friday.  Return here for concerning changes in your condition.

## 2022-03-25 NOTE — ED Triage Notes (Signed)
Pt bib gcems from doctors office for sudden onset of dizziness. No trauma or fall. EMS found pt to be bradycardiac in the 30's and hypotensive at 90/46. 513m NS and 1 mg atropine given. Pt a&ox4 during event.   BP 120/64, HR 60

## 2022-03-25 NOTE — ED Notes (Signed)
Patient verbalizes understanding of discharge instructions. Opportunity for questioning and answers were provided. Armband removed by staff, pt discharged from ED.  

## 2022-03-25 NOTE — ED Provider Notes (Signed)
[facility] EMERGENCY DEPARTMENT Provider Note   CSN: [id number] Arrival date & time: 03/25/22  1621     History  Chief Complaint  Patient presents with   Dizziness   Bradycardia    Shannon Navarro is a 66 y.o. female with a past medical history of HTN, who presents emergency department brought in by EMS with concerns for sudden onset of dizziness onset prior to arrival.  Denies recent fall, trauma.  Patient was found to be bradycardic in the 28-31 with EMS prior to arrival.  Patient blood pressure was soft at 90/46.  At that time patient was having dizziness, lightheadedness, belching, nausea.  Patient notes 1 episode of chest pain to the right side.  Patient notes that she typically has these "gallbladder attacks" that she has been evaluated by her surgeon on 03/28/2022 for her symptoms.  Patient was given 1 mg of atropine and 500 mL of normal saline with resolution of her symptoms.  Denies taking beta-blockers.  Notes that she felt as if she was going to pass out.  Notes that her chest pain is resolved. Denies shortness of breath, vomiting, dizziness.  The history is provided by the patient. No language interpreter was used.       Home Medications Prior to Admission medications   Medication Sig Start Date End Date Taking? Authorizing Provider  cetirizine (ZYRTEC) 10 MG tablet Take 10 mg by mouth daily.   Yes [provider]  Cholecalciferol (VITAMIN D) 125 MCG (5000 UT) CAPS Take 5,000 Units by mouth daily.   Yes [provider]  doxycycline (VIBRA-TABS) 100 MG tablet Take 100 mg by mouth 3 (three) times daily. 03/17/22  Yes [provider]  fluticasone (FLONASE) 50 MCG/ACT nasal spray Place 2 sprays into both nostrils daily. Patient taking differently: Place 2 sprays into both nostrils daily as needed for allergies. 02/18/21  Yes [name], MD  losartan (COZAAR) 100 MG tablet Take 1 tablet (100 mg total) by mouth daily.  09/02/21  Yes Lelon Perla, MD  pantoprazole (PROTONIX) 40 MG tablet TAKE 1 TABLET BY MOUTH TWICE A DAY BEFORE A MEAL Patient taking differently: Take 40 mg by mouth 2 (two) times daily. 08/27/21  Yes Nandigam, Venia Minks, MD  propranolol (INDERAL) 20 MG tablet Take 1 tablet by mouth three times daily as needed Patient taking differently: Take 20 mg by mouth daily as needed (If HR >120). 01/14/21  Yes Hochrein, Jeneen Rinks, MD  Caraway Oil-Levomenthol (FDGARD) 25-20.75 MG CAPS Use as directed Patient not taking: Reported on 03/25/2022 02/17/22   Vladimir Crofts, PA-C  HYDROcodone-acetaminophen (HYCET) 7.5-325 mg/15 ml solution Take 15 mLs by mouth every 6 (six) hours as needed for moderate pain. Patient not taking: Reported on 03/25/2022 01/10/22   Izora Gala, MD  ondansetron (ZOFRAN-ODT) 4 MG disintegrating tablet Take 1 tablet (4 mg total) by mouth every 8 (eight) hours as needed for nausea or vomiting. Patient not taking: Reported on 03/25/2022 01/10/22   Izora Gala, MD  venlafaxine XR (EFFEXOR XR) 75 MG 24 hr capsule Take 1 capsule (75 mg total) by mouth daily with breakfast. Patient not taking: Reported on 03/25/2022 12/26/21   Biagio Borg, MD      Allergies    Cefaclor, Codeine, Hydrocodone, Levaquin [levofloxacin], Nexium [esomeprazole magnesium], Penicillins, and Sulfa antibiotics    Review of Systems   Review of Systems  Neurological:  Positive for dizziness.  All other systems reviewed and are negative.   Physical  Exam Updated Vital Signs BP 125/60   Pulse (!) 55   Temp 97.6 F (36.4 C) (Oral)   Resp 14   Ht '5\' 6"'$  (1.676 m)   Wt 98.9 kg   SpO2 99%   BMI 35.19 kg/m  Physical Exam Vitals and nursing note reviewed.  Constitutional:      General: She is not in acute distress.    Appearance: She is not diaphoretic.  HENT:     Head: Normocephalic and atraumatic.     Mouth/Throat:     Pharynx: No oropharyngeal exudate.  Eyes:     General: No scleral icterus.     Conjunctiva/sclera: Conjunctivae normal.  Cardiovascular:     Rate and Rhythm: Normal rate and regular rhythm.     Pulses: Normal pulses.     Heart sounds: Normal heart sounds.  Pulmonary:     Effort: Pulmonary effort is normal. No respiratory distress.     Breath sounds: Normal breath sounds. No wheezing.  Abdominal:     General: Bowel sounds are normal.     Palpations: Abdomen is soft. There is no mass.     Tenderness: There is no abdominal tenderness. There is no guarding or rebound.  Musculoskeletal:        General: Normal range of motion.     Cervical back: Normal range of motion and neck supple.  Skin:    General: Skin is warm and dry.  Neurological:     Mental Status: She is alert.  Psychiatric:        Behavior: Behavior normal.     ED Results / Procedures / Treatments   Labs (all labs ordered are listed, but only abnormal results are displayed) Labs Reviewed  COMPREHENSIVE METABOLIC PANEL - Abnormal; Notable for the following components:      Result Value   CO2 21 (*)    Glucose, Bld 157 (*)    Total Protein 6.0 (*)    Albumin 3.2 (*)    AST 112 (*)    ALT 71 (*)    All other components within normal limits  CBC WITH DIFFERENTIAL/PLATELET  TSH  T4, FREE  LIPASE, BLOOD  URINALYSIS, ROUTINE W REFLEX MICROSCOPIC  TROPONIN I (HIGH SENSITIVITY)  TROPONIN I (HIGH SENSITIVITY)    EKG EKG Interpretation  Date/Time:  Tuesday March 25 2022 16:33:38 EST Ventricular Rate:  63 PR Interval:    QRS Duration: 111 QT Interval:  462 QTC Calculation: 473 R Axis:   23 Text Interpretation: likely sinus rhythym, but w substantial artefact Poor data quality Confirmed by Carmin Muskrat [phone]) on 03/25/2022 6:30:27 PM  Radiology No results found.  Procedures Procedures    Medications Ordered in ED Medications  sodium chloride 0.9 % bolus 1,000 mL (1,000 mLs Intravenous New Bag/Given 03/25/22 1746)    ED Course/ Medical Decision Making/ A&P Clinical Course as  of 03/25/22 1854  Tue Mar 25, 2022  1706 Consult with Cardiologist, Dr. Stanford Breed who recommends evaluation/workup for possible vasovagal episode.  [SB]  1829 Discussed with patient labs and treatment plan.  Answered all available questions.  Patient agreeable with treatment plan at this time.  Patient notes that her symptoms are feeling better at this time. [SB]    Clinical Course User Index [SB] Sammantha Mehlhaff A, PA-C                           Medical Decision Making Amount and/or Complexity of Data Reviewed Labs: ordered.  Radiology: ordered. ECG/medicine tests: ordered.   Pt presents with concerns for vasovagal episode onset PTA. No chest pain or shortness of breath currently. Pt afebrile, not tachycardic or hypoxic. On exam, patient with no acute cardiovascular, respiratory, abdominal exam findings. Differential diagnosis includes cholelithiasis, biliary colic, cholecystitis, ACS, vasovagal episode, pancreatitis.   Co morbidities that complicate the patient evaluation: HTN Pre-DM  Labs:  I ordered, and personally interpreted labs.  The pertinent results include:   Lipase negative CBC without leukocytosis CMP with elevation of AST and ALT compared to previous labs 12 days ago, total bilirubin unremarkable TSH unremarkable T4 unremarkable Initial troponin at 3, delta troponin ordered and results pending at time of signout.  Imaging: I ordered imaging studies including right upper quadrant ultrasound ordered with results pending at time of signout.   Medications:  I ordered medication including IVF for symptom management Reevaluation of the patient after these medicines and interventions, I reevaluated the patient and found that they have improved I have reviewed the patients home medicines and have made adjustments as needed   Consultations: I requested consultation with the Cardiologist, Dr. Stanford Breed and discussed lab and imaging findings as well as pertinent plan - they  recommend: evaluation/workup for possible vasovagal episode.   Patient case discussed with Dr. Vanita Panda, at sign-out. Plan at sign-out is pending RUQ [place], likely Discharge home if no concerning findings on [place], however, plans may change as per oncoming team. Patient care transferred at sign out.     This chart was dictated using voice recognition software, Dragon. Despite the best efforts of this provider to proofread and correct errors, errors may still occur which can change documentation meaning.   Final Clinical Impression(s) / ED Diagnoses Final diagnoses:  Dizziness  Epigastric abdominal pain  Elevated LFTs    Rx / DC Orders ED Discharge Orders     None         Joslynn Jamroz A, PA-C 03/25/22 Trinna Balloon, MD 03/25/22 2116

## 2022-03-25 NOTE — ED Notes (Signed)
Patient transported to Ultrasound 

## 2022-03-26 ENCOUNTER — Encounter: Payer: Self-pay | Admitting: Cardiology

## 2022-03-26 ENCOUNTER — Ambulatory Visit (INDEPENDENT_AMBULATORY_CARE_PROVIDER_SITE_OTHER): Payer: Medicare Other

## 2022-03-26 ENCOUNTER — Ambulatory Visit: Payer: Medicare Other | Attending: Cardiology | Admitting: Cardiology

## 2022-03-26 VITALS — BP 138/70 | HR 47 | Ht 66.0 in | Wt 226.8 lb

## 2022-03-26 DIAGNOSIS — I10 Essential (primary) hypertension: Secondary | ICD-10-CM | POA: Insufficient documentation

## 2022-03-26 DIAGNOSIS — R001 Bradycardia, unspecified: Secondary | ICD-10-CM | POA: Insufficient documentation

## 2022-03-26 DIAGNOSIS — R42 Dizziness and giddiness: Secondary | ICD-10-CM | POA: Insufficient documentation

## 2022-03-26 DIAGNOSIS — I480 Paroxysmal atrial fibrillation: Secondary | ICD-10-CM | POA: Diagnosis not present

## 2022-03-26 NOTE — Patient Instructions (Addendum)
Medication Instructions:  No changes  *If you need a refill on your cardiac medications before your next appointment, please call your pharmacy*   Lab Work:  Not needed   Testing/Procedures:  Will be mailed to you in 3 to 7 days  Your physician has recommended that you wear a holter monitor 3 days ZIO. Holter monitors are medical devices that record the heart's electrical activity. Doctors most often use these monitors to diagnose arrhythmias. Arrhythmias are problems with the speed or rhythm of the heartbeat. The monitor is a small, portable device. You can wear one while you do your normal daily activities. This is usually used to diagnose what is causing palpitations/syncope (passing out).    Follow-Up: At [facility], you and your health needs are our priority.  As part of our continuing mission to provide you with exceptional heart care, we have created designated Provider Care Teams.  These Care Teams include your primary Cardiologist (physician) and Advanced Practice Providers (APPs -  Physician Assistants and Nurse Practitioners) who all work together to provide you with the care you need, when you need it.     Your next appointment:   3 month(s)  The format for your next appointment:   In Person  Provider:    Girtha Hake PA Sarajane Jews PA    Other Instructions  [place] Monitor Instructions  Your physician has requested you wear a ZIO patch monitor for 3  days.  This is a single patch monitor. [employer] supplies one patch monitor per enrollment. Additional stickers are not available. Please do not apply patch if you will be having a Nuclear Stress Test,  Echocardiogram, Cardiac CT, MRI, or Chest Xray during the period you would be wearing the  monitor. The patch cannot be worn during these tests. You cannot remove and re-apply the  ZIO XT patch monitor.  Your ZIO patch monitor will be mailed 3 day USPS to your address on file. It may take 3-5 days  to  receive your monitor after you have been enrolled.  Once you have received your monitor, please review the enclosed instructions. Your monitor  has already been registered assigning a specific monitor serial # to you.  Billing and Patient Assistance Program Information  We have supplied [employer] with any of your insurance information on file for billing purposes. [employer] offers a sliding scale Patient Assistance Program for patients that do not have  insurance, or whose insurance does not completely cover the cost of the ZIO monitor.  You must apply for the Patient Assistance Program to qualify for this discounted rate.  To apply, please call [employer] at [phone], select option 4, select option 2, ask to apply for  Patient Assistance Program. Theodore Demark will ask your household income, and how many people  are in your household. They will quote your out-of-pocket cost based on that information.  [employer] will also be able to set up a 56-month interest-free payment plan if needed.  Applying the monitor   Shave hair from upper left chest.  Hold abrader disc by orange tab. Rub abrader in 40 strokes over the upper left chest as  indicated in your monitor instructions.  Clean area with 4 enclosed alcohol pads. Let dry.  Apply patch as indicated in monitor instructions. Patch will be placed under collarbone on left  side of chest with arrow pointing upward.  Rub patch adhesive wings for 2 minutes. Remove white label marked "1". Remove the white  label marked "2".  Rub patch adhesive wings for 2 additional minutes.  While looking in a mirror, press and release button in center of patch. A small green light will  flash 3-4 times. This will be your only indicator that the monitor has been turned on.  Do not shower for the first 24 hours. You may shower after the first 24 hours.  Press the button if you feel a symptom. You will hear a small click. Record Date, Time and  Symptom in the Patient  Logbook.  When you are ready to remove the patch, follow instructions on the last 2 pages of Patient  Logbook. Stick patch monitor onto the last page of Patient Logbook.  Place Patient Logbook in the blue and white box. Use locking tab on box and tape box closed  securely. The blue and white box has prepaid postage on it. Please place it in the mailbox as  soon as possible. Your physician should have your test results approximately 7 days after the  monitor has been mailed back to [facility]  Call [place] at [phone] if you have questions regarding  your ZIO XT patch monitor. Call them immediately if you see an orange light blinking on your  monitor.  If your monitor falls off in less than 4 days, contact our Monitor department at [phone].  If your monitor becomes loose or falls off after 4 days call [employer] at [phone] for  suggestions on securing your monitor

## 2022-03-26 NOTE — Progress Notes (Unsigned)
Enrolled patient for a 3 day Zio XT monitor to be mailed to patients home  

## 2022-03-26 NOTE — Progress Notes (Signed)
Cardiology Office Note   Date:  03/26/2022   ID:  Shannon Navarro, DOB 1956/03/24, MRN [id number]  PCP:  [name], MD  Cardiologist:   Minus Breeding, MD   Chief Complaint  Patient presents with   Dizziness      History of Present Illness: Shannon Navarro is a 66 y.o. female who I saw her previously for cardiomegaly on the chest x-ray.  She was also found on a chest x-ray to have aortic atherosclerosis.  She has had a history of bradycardia.   She had an outpatient exercise tolerance test was ordered and completed on 06/13/2015, there was no evidence of ischemia, she was able to achieve 85% maximal heart rate after 6 minutes, no ST segment deviation was noted. The patient presented to the emergency room on 02/21/2016 with palpitation that woke her up from sleep and found to have new onset of atrial fibrillation. Patient spontaneously converted. Given her elevated CHA2DS2-Vasc score 2 (female and HTN), she was placed on Xarelto.    Eventually I took her off of this because she has had no recurrent fibrillation.  Since I last saw her she was in the emergency room yesterday for dizziness.  She was added to my schedule today for this.  I reviewed these records for this visit.  Cardiac enzymes were unremarkable.  EKG was unremarkable.  When she presented she was bradycardic and hypotensive after calling EMS.  She was given atropine and normal saline.  In the emergency room her blood pressure was normal.  EKG was unremarkable.  He thought possibly a vasovagal episode related to biliary cholelithiasis.  Ultrasound of the abdomen yesterday did show stones but no evidence of obstruction or acute findings.    She has been having these gallbladder "attacks which are epigastric discomfort.  She had 1 yesterday before she went to orthopedics.  When she went to orthopedics for therapy on her neck she was getting massage when she said she felt poorly.  She stood up afterwards and went  towards the lobby when she had near syncope.  When EMS was called her heart rate was 30 by her report blood pressure was 60/40.  Events happened as above.  She has not had this prior to that.  She has not had this prior to that.  She has not had any syncope.  She says she always has a slow heart rate.  In fact I looked at her watch and she does have heart rates as low as 38.  She noticed that this morning and she felt a little sluggish but she was not presyncopal.  She was somewhat fatigued.  Looking at her recordings on her phone she seems to have chronotropic competence.  However, she does have a resting bradycardia.  She is otherwise not been having any chest discomfort other than the mid epigastric discomfort that she has has been ascribed to her gallbladder.  She is not having any midsternal discomfort.  She is not having any shortness of breath, PND or orthopnea.  Of note she does say that she has paroxysmal atrial fibrillation and she had 2 days worth of this at the time of her tonsillectomy recently.  However, she is aware when this happens and she has not had recurrence.  Past Medical History:  Diagnosis Date   Allergic rhinitis    Anticoagulant long-term use    XARELTO   ANXIETY    Dysrhythmia    hx of atrial  fib x 2 per pt   Endometrial polyp    Endometrial polyp    Fatty liver 06/23/2007   GERD    Herniated disc, 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   History of esophageal stricture    mainly due to gerd   History of exercise stress test 06/13/2015   PER DR Brendaly Townsel NOTE -- NO EVIDENCE ISCHEMIA   HYPERTENSION    Impaired fasting glucose    Migraine    hx of migraines none recent   OSA on CPAP followed by dr Halford Chessman   per study 02-09-2012  severe osa w/ AHI 33.8   Paroxysmal atrial fibrillation ([place]) cardioloigst-  dr Cordale Manera   dx 02-21-2016 at ED   Pneumonia    with COVID   PONV (postoperative nausea and vomiting)    after surgery in 10/2021- had nausea after surgery related to preop drink    Pre-diabetes    Pseudotumor cerebri 2007   S/P cervical spinal fusion 10/24/2021   Sleep apnea    Vitamin D deficiency 07/2013    Past Surgical History:  Procedure Laterality Date   ABDOMINAL HYSTERECTOMY     ANTERIOR CERVICAL DECOMPRESSION/DISCECTOMY FUSION 4 LEVELS N/A 10/24/2021   Procedure: ANTERIOR CERVICAL DECOMPRESSION FUSION CERVICAL FOUR THROUGH CERVICAL FIVE, CERVICAL FIVE THROUGH CERVICAL SIX, CERVICAL SIX THROUGH CERVICAL SEVEN WITH INSTRUMENTATION AND ALLOGRAFT;  Surgeon: Phylliss Bob, MD;  Location: [facility];  Service: Orthopedics;  Laterality: N/A;   BREAST BIOPSY Right 08/12/2019    FIBROADENOMA    COLONOSCOPY     colonscopy  2007   normal   DILATATION & CURETTAGE/HYSTEROSCOPY WITH MYOSURE N/A 03/26/2017   Procedure: DILATATION & CURETTAGE/HYSTEROSCOPY WITH MYOSURE;  Surgeon: Paula Compton, MD;  Location: [facility];  Service: Gynecology;  Laterality: N/A;   DILATION AND CURETTAGE OF UTERUS  2010   FRACTURE SURGERY     LAPAROSCOPIC HYSTERECTOMY Bilateral 06/18/2017   Procedure: HYSTERECTOMY TOTAL LAPAROSCOPIC;  Surgeon: Paula Compton, MD;  Location: [facility];  Service: Gynecology;  Laterality: Bilateral;  OUT PT IN BED, needs extra time MD request 2.5hrs in OR   right knee arthroscopy  03/2013   TONSILLECTOMY Bilateral 01/10/2022   Procedure: TONSILLECTOMY;  Surgeon: Izora Gala, MD;  Location: [facility];  Service: ENT;  Laterality: Bilateral;   torn meniscus surgery on right knee      TRANSTHORACIC ECHOCARDIOGRAM  03/11/2016   ef 60-65%/  mild MR/ trivial PR and TR   TUBAL LIGATION     UPPER GASTROINTESTINAL ENDOSCOPY  11/2020     Current Outpatient Medications  Medication Sig Dispense Refill   Caraway Oil-Levomenthol (FDGARD) 25-20.75 MG CAPS Use as directed 16 capsule 0   cetirizine (ZYRTEC) 10 MG tablet Take 10 mg by mouth daily.     Cholecalciferol (VITAMIN D) 125 MCG (5000 UT) CAPS Take 5,000 Units by mouth daily.      doxycycline (VIBRA-TABS) 100 MG tablet Take 100 mg by mouth 3 (three) times daily.     fluticasone (FLONASE) 50 MCG/ACT nasal spray Place 2 sprays into both nostrils daily. (Patient taking differently: Place 2 sprays into both nostrils daily as needed for allergies.) 48 g 6   losartan (COZAAR) 100 MG tablet Take 1 tablet (100 mg total) by mouth daily. 90 tablet 3   ondansetron (ZOFRAN-ODT) 4 MG disintegrating tablet Take 1 tablet (4 mg total) by mouth every 8 (eight) hours as needed for nausea or vomiting. 20 tablet 0   pantoprazole (PROTONIX) 40 MG tablet TAKE 1 TABLET BY MOUTH  TWICE A DAY BEFORE A MEAL (Patient taking differently: Take 40 mg by mouth 2 (two) times daily.) 180 tablet 1   propranolol (INDERAL) 20 MG tablet Take 1 tablet by mouth three times daily as needed (Patient taking differently: Take 20 mg by mouth daily as needed (If HR >120).) 90 tablet 3   HYDROcodone-acetaminophen (HYCET) 7.5-325 mg/15 ml solution Take 15 mLs by mouth every 6 (six) hours as needed for moderate pain. (Patient not taking: Reported on 03/26/2022) 420 mL 0   venlafaxine XR (EFFEXOR XR) 75 MG 24 hr capsule Take 1 capsule (75 mg total) by mouth daily with breakfast. (Patient not taking: Reported on 03/26/2022) 90 capsule 3   No current facility-administered medications for this visit.    Allergies:   Cefaclor, Codeine, Hydrocodone, Levaquin [levofloxacin], Nexium [esomeprazole magnesium], Penicillins, and Sulfa antibiotics    ROS:  Please see the history of present illness.   Otherwise, review of systems are positive for none.   All other systems are reviewed and negative.    PHYSICAL EXAM: VS:  BP 138/70 (BP Location: Left Arm, Patient Position: Sitting, Cuff Size: Large)   Pulse (!) 47   Ht '5\' 6"'$  (1.676 m)   Wt 226 lb 12.8 oz (102.9 kg)   SpO2 98%   BMI 36.61 kg/m  , BMI Body mass index is 36.61 kg/m. GENERAL:  Well appearing NECK:  No jugular venous distention, waveform within normal limits,  carotid upstroke brisk and symmetric, no bruits, no thyromegaly LUNGS:  Clear to auscultation bilaterally CHEST:  Unremarkable HEART:  PMI not displaced or sustained,S1 and S2 within normal limits, no S3, no S4, no clicks, no rubs, no murmurs ABD:  Flat, positive bowel sounds normal in frequency in pitch, no bruits, no rebound, no guarding, no midline pulsatile mass, no hepatomegaly, no splenomegaly EXT:  2 plus pulses throughout, no edema, no cyanosis no clubbing   The EKG:  EKG is ordered today. The ekg ordered today demonstrates normal sinus rhythm, rate 47, axis within normal limits, intervals within normal limits, no acute ST-T wave changes.   Recent Labs: 03/13/2022: Magnesium 2.0 03/25/2022: ALT 71; BUN 16; Creatinine, Ser 0.88; Hemoglobin 13.0; Platelets 229; Potassium 3.9; Sodium 142; TSH 3.564    Lipid Panel    Component Value Date/Time   CHOL 164 04/01/2021 1555   TRIG 114.0 04/01/2021 1555   HDL 43.70 04/01/2021 1555   CHOLHDL 4 04/01/2021 1555   VLDL 22.8 04/01/2021 1555   LDLCALC 97 04/01/2021 1555      Wt Readings from Last 3 Encounters:  03/26/22 226 lb 12.8 oz (102.9 kg)  03/25/22 218 lb (98.9 kg)  03/13/22 226 lb 8 oz (102.7 kg)      Other studies Reviewed: Additional studies/ records that were reviewed today include:ED records Review of the above records demonstrates:  Please see elsewhere in the note.     ASSESSMENT AND PLAN:  DIZZINESS: I suspect this was vagal episode on top of her resting bradycardia.  I am going to have her wear a 3-day monitor.  PAF:  Ms. JONTAE SONIER has a CHA2DS2 - VASc score of 2.    HTN:  The blood pressure is well controlled typically.  She is otherwise not having orthostatic symptoms.  BRADYCARDIA:   This will be assessed as above.  She has no syncope or presyncope.  No change in therapy.    OBSTRUCTIVE SLEEP APNEA:    She is using her CPAP and thinks it works well.  AORTIC ATHEROSCLEROSIS:   She had a negative  stress test 2017.  She had no anginal symptoms.  No change in therapy.  PREOP: Preop clearance will be based on the results of the monitor.  Current medicines are reviewed at length with the patient today.  The patient does not have concerns regarding medicines.  The following changes have been made:   None   Labs/ tests ordered today include:   Orders Placed This Encounter  Procedures   LONG TERM MONITOR (3-14 DAYS)     Disposition:   FU with APP in 3 months.  Signed, Minus Breeding, MD  03/26/2022 3:16 PM    [facility]

## 2022-03-28 ENCOUNTER — Ambulatory Visit: Payer: Self-pay | Admitting: Surgery

## 2022-03-28 ENCOUNTER — Telehealth: Payer: Self-pay

## 2022-03-28 DIAGNOSIS — K828 Other specified diseases of gallbladder: Secondary | ICD-10-CM | POA: Insufficient documentation

## 2022-03-28 NOTE — Telephone Encounter (Signed)
Transition Care Management Unsuccessful Follow-up Telephone Call  Date of discharge and from where:  03/25/2022, [facility]  Attempts:  1st Attempt  Reason for unsuccessful TCM follow-up call:  Left voice message

## 2022-03-30 DIAGNOSIS — R001 Bradycardia, unspecified: Secondary | ICD-10-CM | POA: Diagnosis not present

## 2022-03-30 DIAGNOSIS — R42 Dizziness and giddiness: Secondary | ICD-10-CM

## 2022-03-30 DIAGNOSIS — I480 Paroxysmal atrial fibrillation: Secondary | ICD-10-CM

## 2022-03-31 ENCOUNTER — Ambulatory Visit: Payer: Medicare Other

## 2022-04-01 ENCOUNTER — Telehealth: Payer: Self-pay

## 2022-04-01 NOTE — Telephone Encounter (Signed)
   Pre-operative Risk Assessment    Patient Name: Shannon Navarro  DOB: 10/01/1955 MRN: [id number]      Request for Surgical Clearance    Procedure:   Cholecystectomy  Date of Surgery:  Clearance TBD                                 Surgeon:  Armandina Gemma, MD Surgeon's Group or Practice Name:  [facility] Phone number:  [phone] Fax number:  [phone] Geryl Rankins   Type of Clearance Requested:   - Medical    Type of Anesthesia:  General    Additional requests/questions:  Please advise surgeon/provider what medications should be held.  Signed, Elsie Lincoln Redith Drach   04/01/2022, 3:32 PM

## 2022-04-01 NOTE — Telephone Encounter (Signed)
Patient with diagnosis of A Fib but not on any anticoagulation. Sending back to pre op pool  Procedure: Cholecystectomy  Date of procedure: TBD

## 2022-04-07 NOTE — Telephone Encounter (Signed)
   Patient Name: Shannon Navarro  DOB: 26-Aug-1955 MRN: [id number]  Primary Cardiologist: Minus Breeding, MD  Chart reviewed as part of pre-operative protocol coverage. Patient was already seen 03/26/22 by Dr. [name] for pre-op evaluation at which time Dr. [name] ordered 3 day monitor which is in progress at this time. Has CHADSVASC listed of 2, not on anticoagulation. Per office protocol, the provider who saw the patient should forward their finalized clearance decision to requesting party below upon completion of testing. I will route this message to Dr. [name] so they are aware of where to fax final recommendation to.  Will route this message to surgeon as FYI. Will remove this message from the pre-op box as separate preop APP input is not needed.  Charlie Pitter, PA-C 04/07/2022, 3:29 PM

## 2022-04-08 NOTE — Telephone Encounter (Signed)
Left message for patient we are waiting for monitor results.

## 2022-04-09 ENCOUNTER — Other Ambulatory Visit: Payer: Self-pay

## 2022-04-09 ENCOUNTER — Telehealth: Payer: Self-pay

## 2022-04-09 ENCOUNTER — Emergency Department (HOSPITAL_COMMUNITY): Payer: Medicare Other

## 2022-04-09 ENCOUNTER — Inpatient Hospital Stay (HOSPITAL_COMMUNITY): Payer: Medicare Other

## 2022-04-09 ENCOUNTER — Encounter (HOSPITAL_COMMUNITY): Payer: Self-pay | Admitting: Internal Medicine

## 2022-04-09 ENCOUNTER — Inpatient Hospital Stay (HOSPITAL_COMMUNITY)
Admission: EM | Admit: 2022-04-09 | Discharge: 2022-04-13 | DRG: 418 | Disposition: A | Discharge status: Home / Self Care | Payer: Medicare Other | Attending: Internal Medicine | Admitting: Internal Medicine

## 2022-04-09 DIAGNOSIS — I700 Atherosclerosis of aorta: Secondary | ICD-10-CM | POA: Diagnosis present

## 2022-04-09 DIAGNOSIS — R001 Bradycardia, unspecified: Secondary | ICD-10-CM | POA: Diagnosis present

## 2022-04-09 DIAGNOSIS — N179 Acute kidney failure, unspecified: Secondary | ICD-10-CM | POA: Diagnosis not present

## 2022-04-09 DIAGNOSIS — J310 Chronic rhinitis: Secondary | ICD-10-CM | POA: Diagnosis present

## 2022-04-09 DIAGNOSIS — K8062 Calculus of gallbladder and bile duct with acute cholecystitis without obstruction: Principal | ICD-10-CM | POA: Diagnosis present

## 2022-04-09 DIAGNOSIS — Z8701 Personal history of pneumonia (recurrent): Secondary | ICD-10-CM

## 2022-04-09 DIAGNOSIS — K810 Acute cholecystitis: Secondary | ICD-10-CM | POA: Diagnosis present

## 2022-04-09 DIAGNOSIS — Z01810 Encounter for preprocedural cardiovascular examination: Secondary | ICD-10-CM

## 2022-04-09 DIAGNOSIS — Z800 Family history of malignant neoplasm of digestive organs: Secondary | ICD-10-CM

## 2022-04-09 DIAGNOSIS — K449 Diaphragmatic hernia without obstruction or gangrene: Secondary | ICD-10-CM | POA: Diagnosis present

## 2022-04-09 DIAGNOSIS — Z981 Arthrodesis status: Secondary | ICD-10-CM

## 2022-04-09 DIAGNOSIS — Z9989 Dependence on other enabling machines and devices: Secondary | ICD-10-CM

## 2022-04-09 DIAGNOSIS — Z86010 Personal history of colonic polyps: Secondary | ICD-10-CM

## 2022-04-09 DIAGNOSIS — K5909 Other constipation: Secondary | ICD-10-CM | POA: Diagnosis present

## 2022-04-09 DIAGNOSIS — Z8616 Personal history of COVID-19: Secondary | ICD-10-CM | POA: Diagnosis not present

## 2022-04-09 DIAGNOSIS — E669 Obesity, unspecified: Secondary | ICD-10-CM | POA: Diagnosis present

## 2022-04-09 DIAGNOSIS — K760 Fatty (change of) liver, not elsewhere classified: Secondary | ICD-10-CM | POA: Diagnosis present

## 2022-04-09 DIAGNOSIS — Z833 Family history of diabetes mellitus: Secondary | ICD-10-CM

## 2022-04-09 DIAGNOSIS — K8050 Calculus of bile duct without cholangitis or cholecystitis without obstruction: Secondary | ICD-10-CM | POA: Diagnosis not present

## 2022-04-09 DIAGNOSIS — I10 Essential (primary) hypertension: Secondary | ICD-10-CM | POA: Diagnosis present

## 2022-04-09 DIAGNOSIS — R1314 Dysphagia, pharyngoesophageal phase: Secondary | ICD-10-CM | POA: Diagnosis present

## 2022-04-09 DIAGNOSIS — N1411 Contrast-induced nephropathy: Secondary | ICD-10-CM | POA: Diagnosis present

## 2022-04-09 DIAGNOSIS — F419 Anxiety disorder, unspecified: Secondary | ICD-10-CM | POA: Diagnosis present

## 2022-04-09 DIAGNOSIS — Z881 Allergy status to other antibiotic agents status: Secondary | ICD-10-CM

## 2022-04-09 DIAGNOSIS — K819 Cholecystitis, unspecified: Secondary | ICD-10-CM | POA: Diagnosis not present

## 2022-04-09 DIAGNOSIS — K222 Esophageal obstruction: Secondary | ICD-10-CM | POA: Diagnosis not present

## 2022-04-09 DIAGNOSIS — Z6832 Body mass index (BMI) 32.0-32.9, adult: Secondary | ICD-10-CM | POA: Diagnosis not present

## 2022-04-09 DIAGNOSIS — G932 Benign intracranial hypertension: Secondary | ICD-10-CM | POA: Diagnosis present

## 2022-04-09 DIAGNOSIS — G4733 Obstructive sleep apnea (adult) (pediatric): Secondary | ICD-10-CM | POA: Diagnosis present

## 2022-04-09 DIAGNOSIS — Z882 Allergy status to sulfonamides status: Secondary | ICD-10-CM

## 2022-04-09 DIAGNOSIS — Z0189 Encounter for other specified special examinations: Secondary | ICD-10-CM

## 2022-04-09 DIAGNOSIS — I471 Supraventricular tachycardia, unspecified: Secondary | ICD-10-CM | POA: Diagnosis present

## 2022-04-09 DIAGNOSIS — Z888 Allergy status to other drugs, medicaments and biological substances status: Secondary | ICD-10-CM

## 2022-04-09 DIAGNOSIS — R7303 Prediabetes: Secondary | ICD-10-CM | POA: Diagnosis present

## 2022-04-09 DIAGNOSIS — Z880 Allergy status to penicillin: Secondary | ICD-10-CM

## 2022-04-09 DIAGNOSIS — Z8249 Family history of ischemic heart disease and other diseases of the circulatory system: Secondary | ICD-10-CM

## 2022-04-09 DIAGNOSIS — R7989 Other specified abnormal findings of blood chemistry: Secondary | ICD-10-CM | POA: Diagnosis present

## 2022-04-09 DIAGNOSIS — I480 Paroxysmal atrial fibrillation: Secondary | ICD-10-CM | POA: Diagnosis present

## 2022-04-09 DIAGNOSIS — K219 Gastro-esophageal reflux disease without esophagitis: Secondary | ICD-10-CM | POA: Diagnosis present

## 2022-04-09 DIAGNOSIS — E559 Vitamin D deficiency, unspecified: Secondary | ICD-10-CM | POA: Diagnosis present

## 2022-04-09 DIAGNOSIS — Z79899 Other long term (current) drug therapy: Secondary | ICD-10-CM

## 2022-04-09 DIAGNOSIS — Z803 Family history of malignant neoplasm of breast: Secondary | ICD-10-CM

## 2022-04-09 DIAGNOSIS — Z83719 Family history of colon polyps, unspecified: Secondary | ICD-10-CM

## 2022-04-09 DIAGNOSIS — Z90710 Acquired absence of both cervix and uterus: Secondary | ICD-10-CM

## 2022-04-09 DIAGNOSIS — I4891 Unspecified atrial fibrillation: Secondary | ICD-10-CM | POA: Diagnosis not present

## 2022-04-09 DIAGNOSIS — Z8042 Family history of malignant neoplasm of prostate: Secondary | ICD-10-CM

## 2022-04-09 DIAGNOSIS — Z7901 Long term (current) use of anticoagulants: Secondary | ICD-10-CM

## 2022-04-09 DIAGNOSIS — K5730 Diverticulosis of large intestine without perforation or abscess without bleeding: Secondary | ICD-10-CM | POA: Diagnosis present

## 2022-04-09 DIAGNOSIS — Z885 Allergy status to narcotic agent status: Secondary | ICD-10-CM

## 2022-04-09 LAB — ECHOCARDIOGRAM COMPLETE
AR max vel: 2.59 cm2
AV Area VTI: 2.59 cm2
AV Area mean vel: 2.46 cm2
AV Mean grad: 9 mmHg
AV Peak grad: 15.2 mmHg
Ao pk vel: 1.95 m/s
Area-P 1/2: 3.72 cm2
MV VTI: 2.76 cm2
S' Lateral: 3.2 cm

## 2022-04-09 LAB — COMPREHENSIVE METABOLIC PANEL
ALT: 137 U/L — ABNORMAL HIGH (ref 0–44)
AST: 200 U/L — ABNORMAL HIGH (ref 15–41)
Albumin: 3.8 g/dL (ref 3.5–5.0)
Alkaline Phosphatase: 119 U/L (ref 38–126)
Anion gap: 5 (ref 5–15)
BUN: 17 mg/dL (ref 8–23)
CO2: 26 mmol/L (ref 22–32)
Calcium: 9.3 mg/dL (ref 8.9–10.3)
Chloride: 108 mmol/L (ref 98–111)
Creatinine, Ser: 0.75 mg/dL (ref 0.44–1.00)
GFR, Estimated: >60 mL/min (ref >60)
Glucose, Bld: 152 mg/dL — ABNORMAL HIGH (ref 70–99)
Potassium: 3.7 mmol/L (ref 3.5–5.1)
Sodium: 139 mmol/L (ref 135–145)
Total Bilirubin: 2.3 mg/dL — ABNORMAL HIGH (ref 0.3–1.2)
Total Protein: 7.2 g/dL (ref 6.5–8.1)

## 2022-04-09 LAB — URINALYSIS, ROUTINE W REFLEX MICROSCOPIC
Bilirubin Urine: NEGATIVE
Glucose, UA: NEGATIVE mg/dL
Hgb urine dipstick: NEGATIVE
Ketones, ur: 5 mg/dL — AB
Leukocytes,Ua: NEGATIVE
Nitrite: NEGATIVE
Protein, ur: NEGATIVE mg/dL
Specific Gravity, Urine: 1.029 (ref 1.005–1.030)
pH: 5 (ref 5.0–8.0)

## 2022-04-09 LAB — CBC
HCT: 41.6 % (ref 36.0–46.0)
Hemoglobin: 13.5 g/dL (ref 12.0–15.0)
MCH: 28.5 pg (ref 26.0–34.0)
MCHC: 32.5 g/dL (ref 30.0–36.0)
MCV: 87.9 fL (ref 80.0–100.0)
Platelets: 231 10*3/uL (ref 150–400)
RBC: 4.73 MIL/uL (ref 3.87–5.11)
RDW: 13 % (ref 11.5–15.5)
WBC: 8.2 10*3/uL (ref 4.0–10.5)
nRBC: 0 % (ref 0.0–0.2)

## 2022-04-09 LAB — LIPASE, BLOOD: Lipase: 41 U/L (ref 11–51)

## 2022-04-09 LAB — CBG MONITORING, ED: Glucose-Capillary: 102 mg/dL — ABNORMAL HIGH (ref 70–99)

## 2022-04-09 MED ORDER — CIPROFLOXACIN IN D5W 400 MG/200ML IV SOLN
400.0000 mg | Freq: Two times a day (BID) | INTRAVENOUS | Status: DC
  Administered 2022-04-09 – 2022-04-10 (×2): 400 mg via INTRAVENOUS
  Filled 2022-04-09 (×2): qty 200

## 2022-04-09 MED ORDER — ACETAMINOPHEN 325 MG PO TABS: 650.0000 mg | ORAL_TABLET | Freq: Four times a day (QID) | ORAL | Status: DC | PRN

## 2022-04-09 MED ORDER — FAMOTIDINE IN NACL 20-0.9 MG/50ML-% IV SOLN
20.0000 mg | Freq: Two times a day (BID) | INTRAVENOUS | Status: DC
  Administered 2022-04-09 – 2022-04-11 (×6): 20 mg via INTRAVENOUS
  Filled 2022-04-09 (×6): qty 50

## 2022-04-09 MED ORDER — PROCHLORPERAZINE EDISYLATE 10 MG/2ML IJ SOLN
10.0000 mg | Freq: Four times a day (QID) | INTRAMUSCULAR | Status: DC | PRN
  Administered 2022-04-09: 10 mg via INTRAVENOUS
  Filled 2022-04-09: qty 2

## 2022-04-09 MED ORDER — LORAZEPAM 2 MG/ML IJ SOLN
1.0000 mg | Freq: Once | INTRAMUSCULAR | Status: AC | PRN
  Administered 2022-04-09: 1 mg via INTRAVENOUS
  Filled 2022-04-09: qty 1

## 2022-04-09 MED ORDER — ONDANSETRON HCL 4 MG/2ML IJ SOLN
4.0000 mg | Freq: Once | INTRAMUSCULAR | Status: AC
  Administered 2022-04-09: 4 mg via INTRAVENOUS
  Filled 2022-04-09: qty 2

## 2022-04-09 MED ORDER — HYDROMORPHONE HCL 1 MG/ML IJ SOLN
1.0000 mg | Freq: Once | INTRAMUSCULAR | Status: AC
  Administered 2022-04-09: 1 mg via INTRAVENOUS
  Filled 2022-04-09: qty 1

## 2022-04-09 MED ORDER — METOCLOPRAMIDE HCL 5 MG/ML IJ SOLN
10.0000 mg | Freq: Once | INTRAMUSCULAR | Status: AC
  Administered 2022-04-09: 10 mg via INTRAVENOUS
  Filled 2022-04-09: qty 2

## 2022-04-09 MED ORDER — ACETAMINOPHEN 650 MG RE SUPP: 650.0000 mg | Freq: Four times a day (QID) | RECTAL | Status: DC | PRN

## 2022-04-09 MED ORDER — HYDROMORPHONE HCL 1 MG/ML IJ SOLN
1.0000 mg | INTRAMUSCULAR | Status: DC | PRN
  Filled 2022-04-09: qty 1

## 2022-04-09 MED ORDER — GADOBUTROL 1 MMOL/ML IV SOLN
10.0000 mL | Freq: Once | INTRAVENOUS | Status: AC | PRN
  Administered 2022-04-09: 10 mL via INTRAVENOUS

## 2022-04-09 MED ORDER — METRONIDAZOLE 500 MG/100ML IV SOLN
500.0000 mg | Freq: Two times a day (BID) | INTRAVENOUS | Status: DC
  Administered 2022-04-09 – 2022-04-10 (×3): 500 mg via INTRAVENOUS
  Filled 2022-04-09 (×3): qty 100

## 2022-04-09 MED ORDER — CIPROFLOXACIN IN D5W 400 MG/200ML IV SOLN
400.0000 mg | Freq: Once | INTRAVENOUS | Status: AC
  Administered 2022-04-09: 400 mg via INTRAVENOUS
  Filled 2022-04-09: qty 200

## 2022-04-09 MED ORDER — ORAL CARE MOUTH RINSE: 15.0000 mL | OROMUCOSAL | Status: DC | PRN

## 2022-04-09 NOTE — Consult Note (Addendum)
Cardiology Consultation   Patient ID: Shannon Navarro MRN: [id number]; DOB: July 19, 1955  Admit date: 04/09/2022 Date of Consult: 04/09/2022  PCP:  Shannon Koch, MD   [facility] Cardiologist:  Shannon Breeding, MD        Patient Profile:   Shannon Navarro is a 66 y.o. female with a hx of OSA on CPAP, hypertension, aortic atherosclerosis, history of asymptomatic bradycardia and PAF who is being seen 04/09/2022 for preoperative clearance prior to possible gallbladder surgery at the request of Shannon. Olevia Navarro.  History of Present Illness:   Shannon Navarro is a 66 year old female with past medical history of OSA on CPAP, hypertension, aortic atherosclerosis, history of asymptomatic bradycardia and PAF.  She had a ETT in February 2017 that showed no evidence of ischemia, she was able to achieve 85% maximal heart rate after 6-minute.  She was diagnosed with new onset of atrial fibrillation in October 2017 after waking up with palpitation.  She was initially placed on Xarelto, however later Xarelto was discontinued due to lack of recurrence.  Last echocardiogram obtained on 09/08/2019 showed EF 60 to 65%, mild LVH, grade 1 DD, trivial MR.  More recently, she went to the emergency room on 03/25/2022 after presented with a near syncope at orthopedic doctor's office.  EMS found the patient to be bradycardic in the 30s and hypotensive with blood pressure of 90/46.  She was given 500 mL of normal saline and 1 mg of atropine.  By the time, she arrived in the emergency room, her blood pressure has improved.  She was found to have a vagal episode on top of her resting bradycardia.  Ultrasound of abdomen showed gallstones.  Patient was seen by Shannon Navarro on the following day on 03/26/2022 who recommended a 3-day heart monitor.  She also informed Shannon Navarro that she was having gallbladder attacks, Shannon Navarro recommended a heart monitor prior to final clearance.  Heart monitor  demonstrated minimal heart rate 32 bpm around 2 AM, maximal heart rate of 148, average heart rate 47 bpm.  She had a 5 episode of SVT, fastest interval lasting 8 beats with a heart rate of 148 bpm, longest episode 13 beats with average heart rate of 102 bpm.  No significant PVC burden.  Patient was in her usual state of health until roughly 7 PM last night when she started having increasing abdominal pain.  She described abdominal pain as a epigastric pain that radiated bilaterally across the upper abdomen.  She typically notices this pain after eating.  She eventually sought medical attention this morning at [facility] ED.  Renal function and electrolyte were normal, however AST was elevated to 200, ALT 137.  White blood cell count and hemoglobin normal.  Abdominal right upper quadrant ultrasound showed cholelithiasis with trace volume of pericholecystic fluid, negative Murphy sign on examination, study was equivocal, there was dilatation of the common bile duct measuring at 8.5 mm.  Abdominal MRI with MRCP showed cholelithiasis, mild pericholecystic fluid suspicious for acute cholecystitis.  No evidence of choledocholithiasis.  Moderate-sized hiatal hernia.  Patient was admitted to hospitalist service, general surgery service was consulted.  Cardiology service was consulted for preop clearance.  Talking with the patient, she has not been feeling very good for the past 4 weeks.  She was having frequent episode of epigastric pain with food.  Prior to 4 weeks ago, she was a lot more active and was able to walk some around 9000-10,000 steps each day.  She denies ever having any chest pain or worsening dyspnea.    Past Medical History:  Diagnosis Date   Allergic rhinitis    Anticoagulant long-term use    XARELTO   ANXIETY    Dysrhythmia    hx of atrial fib x 2 per pt   Endometrial polyp    Endometrial polyp    Fatty liver 06/23/2007   GERD    Herniated disc, 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   History of  esophageal stricture    mainly due to gerd   History of exercise stress test 06/13/2015   PER Shannon Navarro NOTE -- NO EVIDENCE ISCHEMIA   HYPERTENSION    Impaired fasting glucose    Migraine    hx of migraines none recent   OSA on CPAP followed by Shannon Halford [place]   per study 02-09-2012  severe osa w/ AHI 33.8   Paroxysmal atrial fibrillation ([place]) cardioloigst-  Shannon Navarro   dx 02-21-2016 at ED   Pneumonia    with COVID   PONV (postoperative nausea and vomiting)    after surgery in 10/2021- had nausea after surgery related to preop drink   Pre-diabetes    Pseudotumor cerebri 2007   S/P cervical spinal fusion 10/24/2021   Sleep apnea    Vitamin D deficiency 07/2013    Past Surgical History:  Procedure Laterality Date   ABDOMINAL HYSTERECTOMY     ANTERIOR CERVICAL DECOMPRESSION/DISCECTOMY FUSION 4 LEVELS N/A 10/24/2021   Procedure: ANTERIOR CERVICAL DECOMPRESSION FUSION CERVICAL FOUR THROUGH CERVICAL FIVE, CERVICAL FIVE THROUGH CERVICAL SIX, CERVICAL SIX THROUGH CERVICAL SEVEN WITH INSTRUMENTATION AND ALLOGRAFT;  Surgeon: Phylliss Bob, MD;  Location: [facility];  Service: Orthopedics;  Laterality: N/A;   BREAST BIOPSY Right 08/12/2019    FIBROADENOMA    COLONOSCOPY     colonscopy  2007   normal   DILATATION & CURETTAGE/HYSTEROSCOPY WITH MYOSURE N/A 03/26/2017   Procedure: DILATATION & CURETTAGE/HYSTEROSCOPY WITH MYOSURE;  Surgeon: Paula Compton, MD;  Location: [facility];  Service: Gynecology;  Laterality: N/A;   DILATION AND CURETTAGE OF UTERUS  2010   FRACTURE SURGERY     LAPAROSCOPIC HYSTERECTOMY Bilateral 06/18/2017   Procedure: HYSTERECTOMY TOTAL LAPAROSCOPIC;  Surgeon: Paula Compton, MD;  Location: [facility];  Service: Gynecology;  Laterality: Bilateral;  OUT PT IN BED, needs extra time MD request 2.5hrs in OR   right knee arthroscopy  03/2013   TONSILLECTOMY Bilateral 01/10/2022   Procedure: TONSILLECTOMY;  Surgeon: Izora Gala, MD;  Location:  [facility];  Service: ENT;  Laterality: Bilateral;   torn meniscus surgery on right knee      TRANSTHORACIC ECHOCARDIOGRAM  03/11/2016   ef 60-65%/  mild MR/ trivial PR and TR   TUBAL LIGATION     UPPER GASTROINTESTINAL ENDOSCOPY  11/2020     Home Medications:  Prior to Admission medications   Medication Sig Start Date End Date Taking? Authorizing Provider  Caraway Oil-Levomenthol (FDGARD) 25-20.75 MG CAPS Use as directed 02/17/22   Vladimir Crofts, PA-C  cetirizine (ZYRTEC) 10 MG tablet Take 10 mg by mouth daily.    [provider]  Cholecalciferol (VITAMIN D) 125 MCG (5000 UT) CAPS Take 5,000 Units by mouth daily.    [provider]  doxycycline (VIBRA-TABS) 100 MG tablet Take 100 mg by mouth 3 (three) times daily. 03/17/22   [provider]  fluticasone (FLONASE) 50 MCG/ACT nasal spray Place 2 sprays into both nostrils daily. Patient taking differently: Place 2 sprays into both nostrils daily  as needed for allergies. 02/18/21   Shannon Koch, MD  HYDROcodone-acetaminophen (HYCET) 7.5-325 mg/15 ml solution Take 15 mLs by mouth every 6 (six) hours as needed for moderate pain. Patient not taking: Reported on 03/26/2022 01/10/22   Izora Gala, MD  losartan (COZAAR) 100 MG tablet Take 1 tablet (100 mg total) by mouth daily. 09/02/21   Lelon Perla, MD  ondansetron (ZOFRAN-ODT) 4 MG disintegrating tablet Take 1 tablet (4 mg total) by mouth every 8 (eight) hours as needed for nausea or vomiting. 01/10/22   Izora Gala, MD  pantoprazole (PROTONIX) 40 MG tablet TAKE 1 TABLET BY MOUTH TWICE A DAY BEFORE A MEAL Patient taking differently: Take 40 mg by mouth 2 (two) times daily. 08/27/21   Mauri Pole, MD  propranolol (INDERAL) 20 MG tablet Take 1 tablet by mouth three times daily as needed Patient taking differently: Take 20 mg by mouth daily as needed (If HR >120). 01/14/21   Shannon Breeding, MD  venlafaxine XR (EFFEXOR XR) 75 MG 24 hr capsule Take 1 capsule  (75 mg total) by mouth daily with breakfast. Patient not taking: Reported on 03/26/2022 12/26/21   Biagio Borg, MD    Inpatient Medications: Scheduled Meds:  Continuous Infusions:  ciprofloxacin     famotidine (PEPCID) IV     metronidazole     PRN Meds: acetaminophen **OR** acetaminophen, HYDROmorphone (DILAUDID) injection, prochlorperazine  Allergies:    Allergies  Allergen Reactions   Cefaclor Rash   Codeine Nausea And Vomiting   Hydrocodone Nausea And Vomiting    Severe vomiting/  COUGH SYRUP CAUSE SEVERE VOMITING   Levaquin [Levofloxacin] Nausea Only   Nexium [Esomeprazole Magnesium] Other (See Comments)    Made legs ache   Penicillins Rash   Sulfa Antibiotics Rash    Social History:   Social History   Socioeconomic History   Marital status: Married    Spouse name: Not on file   Number of children: 2   Years of education: Not on file   Highest education level: Not on file  Occupational History   Occupation: Retired from [employer]: [place]: from [place], currently works [employer] in [place] Use   Smoking status: Never   Smokeless tobacco: Never   Tobacco comments:    Married lives with spouse + 2 daughters  Vaping Use   Vaping Use: Never used  Substance and Sexual Activity   Alcohol use: No   Drug use: No   Sexual activity: Not on file  Other Topics Concern   Not on file  Social History Narrative   Occupation: Management, social services. Married, lives with husband. Has 2 daughter 53 - 19   Social Determinants of Sales executive: Not on [employer] Insecurity: Not on file  Transportation Needs: Not on file  Physical Activity: Not on file  Stress: Not on file  Social Connections: Not on file  Intimate Partner Violence: Not on file    Family History:    Family History  Problem Relation Age of Onset   Breast cancer Mother        37   Asthma Mother    Hypertension Mother     Prostate cancer Father    Aortic stenosis Father    Hypertension Father    Colon polyps Father    Heart disease Father    Diabetes Father  Breast cancer Maternal Aunt    Breast cancer Maternal Aunt    Esophageal cancer Other    Stomach cancer Other    Colon cancer Other    Breast cancer Niece        53s   Rectal cancer Neg Hx      ROS:  Please see the history of present illness.   All other ROS reviewed and negative.     Physical Exam/Data:   Vitals:   04/09/22 0515 04/09/22 0630 04/09/22 0703 04/09/22 1008  BP: (!) 143/74 121/66  (!) 122/39  Pulse: (!) 47 89  (!) 47  Resp: '18 18  18  '$ Temp:   97.7 F (36.5 C)   TempSrc:   Oral   SpO2: 97% 96%  97%   No intake or output data in the 24 hours ending 04/09/22 1209    03/26/2022    2:18 PM 03/25/2022    4:27 PM 03/13/2022    8:26 AM  Last 3 Weights  Weight (lbs) 226 lb 12.8 oz 218 lb 226 lb 8 oz  Weight (kg) 102.876 kg 98.884 kg 102.74 kg     There is no height or weight on file to calculate BMI.  General:  Well nourished, well developed, in no acute distress HEENT: normal Neck: no JVD Vascular: No carotid bruits; Distal pulses 2+ bilaterally Cardiac:  normal S1, S2; RRR; no murmur  Lungs:  clear to auscultation bilaterally, no wheezing, rhonchi or rales  Abd: soft, nontender, no hepatomegaly  Ext: no edema Musculoskeletal:  No deformities, BUE and BLE strength normal and equal Skin: warm and dry  Neuro:  CNs 2-12 intact, no focal abnormalities noted Psych:  Normal affect   EKG:  The EKG was personally reviewed and demonstrates: Last EKG 03/25/2022 normal sinus rhythm, no significant ST-T wave changes Telemetry:  Telemetry was personally reviewed and demonstrates: Not on telemetry  Relevant CV Studies:  Echo 09/08/2019  1. Left ventricular ejection fraction, by estimation, is 60 to 65%. The  left ventricle has normal function. The left ventricle has no regional  wall motion abnormalities. There is mild left  ventricular hypertrophy.  Left ventricular diastolic parameters  are consistent with Grade I diastolic dysfunction (impaired relaxation).  The average left ventricular global longitudinal strain is -21.7 %. The  global longitudinal strain is normal.   2. Right ventricular systolic function is normal. The right ventricular  size is normal. There is normal pulmonary artery systolic pressure.   3. The mitral valve is abnormal. Trivial mitral valve regurgitation.   4. The aortic valve is tricuspid. Aortic valve regurgitation is not  visualized.   5. The inferior vena cava is normal in size with greater than 50%  respiratory variability, suggesting right atrial pressure of 3 mmHg.    Laboratory Data:  High Sensitivity Troponin:   Recent Labs  Lab 03/25/22 1710 03/25/22 1946  TROPONINIHS 3 4     Chemistry Recent Labs  Lab 04/09/22 0136  NA 139  K 3.7  CL 108  CO2 26  GLUCOSE 152*  BUN 17  CREATININE 0.75  CALCIUM 9.3  GFRNONAA >60  ANIONGAP 5    Recent Labs  Lab 04/09/22 0136  PROT 7.2  ALBUMIN 3.8  AST 200*  ALT 137*  ALKPHOS 119  BILITOT 2.3*   Lipids No results for input(s): "CHOL", "TRIG", "HDL", "LABVLDL", "LDLCALC", "CHOLHDL" in the last 168 hours.  Hematology Recent Labs  Lab 04/09/22 0136  WBC 8.2  RBC 4.73  HGB 13.5  HCT 41.6  MCV 87.9  MCH 28.5  MCHC 32.5  RDW 13.0  PLT 231   Thyroid No results for input(s): "TSH", "FREET4" in the last 168 hours.  BNPNo results for input(s): "BNP", "PROBNP" in the last 168 hours.  DDimer No results for input(s): "DDIMER" in the last 168 hours.   Radiology/Studies:  MR ABDOMEN MRCP W WO CONTAST  Result Date: 04/09/2022 CLINICAL DATA:  Upper abdominal pain with nausea and vomiting. Cholelithiasis, gallbladder distension and pericholecystic fluid on ultrasound. EXAM: MRI ABDOMEN WITHOUT AND WITH CONTRAST (INCLUDING MRCP) TECHNIQUE: Multiplanar multisequence MR imaging of the abdomen was performed both before  and after the administration of intravenous contrast. Heavily T2-weighted images of the biliary and pancreatic ducts were obtained, and three-dimensional MRCP images were rendered by post processing. CONTRAST:  16m GADAVIST GADOBUTROL 1 MMOL/ML IV SOLN COMPARISON:  Abdominal ultrasound 04/09/2022 and 03/25/2022. Abdominal CT 10/07/2012. FINDINGS: Lower chest: There is a moderate size hiatal hernia. The visualized lower chest otherwise appears unremarkable. Hepatobiliary: No evidence of steatosis, cirrhosis or focal liver lesion. As seen on ultrasound, there are several gallstones. There is mild pericholecystic fluid. There is mild extrahepatic biliary dilatation with the common hepatic duct measuring up to 8 mm in diameter. The duct tapers distally. No evidence of choledocholithiasis. No intrahepatic biliary dilatation. Pancreas: Unremarkable. No pancreatic ductal dilatation or surrounding inflammatory changes. Spleen: Normal in size without focal abnormality. Adrenals/Urinary Tract: Both adrenal glands appear normal. No evidence of hydronephrosis or suspicious renal lesion. Bladder not imaged. Stomach/Bowel: Moderate stool throughout the colon. No evidence of bowel distension or inflammation. Vascular/Lymphatic: There are no enlarged abdominal lymph nodes. No significant vascular findings. Other: No evidence of abdominal wall hernia or ascites. Musculoskeletal: No acute or significant osseous findings. IMPRESSION: 1. Cholelithiasis with mild pericholecystic fluid, suspicious for acute cholecystitis. A sonographic MPercell Millersign was not reported on earlier ultrasound. Correlate clinically. 2. Mild extrahepatic biliary dilatation without evidence of choledocholithiasis. 3. No other significant abdominal findings. 4. Moderate size hiatal hernia. Electronically Signed   By: WRichardean SaleM.D.   On: 04/09/2022 09:52   MR 3D Recon At Scanner  Result Date: 04/09/2022 CLINICAL DATA:  Upper abdominal pain with nausea  and vomiting. Cholelithiasis, gallbladder distension and pericholecystic fluid on ultrasound. EXAM: MRI ABDOMEN WITHOUT AND WITH CONTRAST (INCLUDING MRCP) TECHNIQUE: Multiplanar multisequence MR imaging of the abdomen was performed both before and after the administration of intravenous contrast. Heavily T2-weighted images of the biliary and pancreatic ducts were obtained, and three-dimensional MRCP images were rendered by post processing. CONTRAST:  153mGADAVIST GADOBUTROL 1 MMOL/ML IV SOLN COMPARISON:  Abdominal ultrasound 04/09/2022 and 03/25/2022. Abdominal CT 10/07/2012. FINDINGS: Lower chest: There is a moderate size hiatal hernia. The visualized lower chest otherwise appears unremarkable. Hepatobiliary: No evidence of steatosis, cirrhosis or focal liver lesion. As seen on ultrasound, there are several gallstones. There is mild pericholecystic fluid. There is mild extrahepatic biliary dilatation with the common hepatic duct measuring up to 8 mm in diameter. The duct tapers distally. No evidence of choledocholithiasis. No intrahepatic biliary dilatation. Pancreas: Unremarkable. No pancreatic ductal dilatation or surrounding inflammatory changes. Spleen: Normal in size without focal abnormality. Adrenals/Urinary Tract: Both adrenal glands appear normal. No evidence of hydronephrosis or suspicious renal lesion. Bladder not imaged. Stomach/Bowel: Moderate stool throughout the colon. No evidence of bowel distension or inflammation. Vascular/Lymphatic: There are no enlarged abdominal lymph nodes. No significant vascular findings. Other: No evidence of abdominal wall hernia or ascites. Musculoskeletal: No acute  or significant osseous findings. IMPRESSION: 1. Cholelithiasis with mild pericholecystic fluid, suspicious for acute cholecystitis. A sonographic Percell Miller sign was not reported on earlier ultrasound. Correlate clinically. 2. Mild extrahepatic biliary dilatation without evidence of choledocholithiasis. 3. No  other significant abdominal findings. 4. Moderate size hiatal hernia. Electronically Signed   By: Richardean Sale M.D.   On: 04/09/2022 09:52   US ABDOMEN LIMITED RUQ (LIVER/GB)  Result Date: 04/09/2022 CLINICAL DATA:  66 year old female with history of right upper quadrant abdominal pain for the past 5 months. EXAM: ULTRASOUND ABDOMEN LIMITED RIGHT UPPER QUADRANT COMPARISON:  Abdominal ultrasound 03/25/2022. FINDINGS: Gallbladder: Echogenic foci with posterior acoustic shadowing noted lying dependently in the gallbladder, measuring up to 1.9 cm in diameter. Gallbladder is moderately distended. Gallbladder wall thickness is upper limits of normal at 3 mm. Trace amount of pericholecystic fluid. However, per report from the sonographer, there was no sonographic Murphy's sign on examination. Common bile duct: Diameter: 8.5 mm. Liver: No focal lesion identified. Within normal limits in parenchymal echogenicity. Portal vein is patent on color Doppler imaging with normal direction of blood flow towards the liver. Other: None. IMPRESSION: 1. Cholelithiasis with trace volume of pericholecystic fluid, but no frank sonographic Murphy's sign on examination. Findings are equivocal, but not strongly suggestive of acute cholecystitis given the lack of sonographic Murphy's sign. 2. However, there is dilatation of the common bile duct (8.5 mm). If there is clinical concern for biliary tract obstruction from choledocholithiasis, further evaluation with abdominal MRI with and without IV gadolinium with MRCP should be considered at this time. Electronically Signed   By: Vinnie Langton M.D.   On: 04/09/2022 05:53     Assessment and Plan:   Preoperative clearance: Patient likely will require surgery for acute cholecystitis seen on MRCP.  She denies any recent chest pain or worsening dyspnea.  Her primary concern was epigastric pain radiating to both side of upper abdomen.  The symptom occurs with food.  Prior to 4 weeks ago,  she was able to walk between 9000-10,000 steps daily.  Will discuss with MD, potentially consider echocardiogram, but otherwise I would not recommend any aggressive workup.  Place patient on telemetry.  Her heart rate will need to be closely monitored during the surgery given bradycardia and the effect of anesthesia.  Repeat EKG, last EKG 03/25/2022  Acute cholecystitis: Will defer management to general surgery  Elevated liver enzymes: Likely related to #2  History of asymptomatic bradycardia: Patient has a history of asymptomatic bradycardia.  Recent heart monitor showed minimal heart rate 32, average heart rate 47.  Her monitor was ordered due to episode of likely vagal mediated presyncope.  Presyncope has not recurred since.  She denies any of dizziness, blurred vision or feeling of passing out while wearing the heart monitor.  Obstructive sleep apnea on CPAP therapy  PAF: Solitary episode of atrial fibrillation in 2017, was initially placed on Xarelto, however removed due to lack of recurrence.  No evidence of atrial fibrillation on recent 3-day Holter monitor.  Hypertension   Risk Assessment/Risk Scores:                For questions or updates, please contact [facility] Please consult [email or web address] for contact info under    Hilbert Corrigan, [place]  04/09/2022 12:09 PM  Patient seen and examined with Almyra Deforest PA.  Agree as above, with the following exceptions and changes as noted below. 6 female, patient of Shannon Navarro who presents with acute cholecystitis awaiting  surgical management. Cardiology called for optimization preop. Gen: NAD, CV: RRR, soft 1/6 lusb systolic murmur, Lungs: clear, Abd: soft, Extrem: Warm, well perfused, no edema, Neuro/Psych: alert and oriented x 3, normal mood and affect. All available labs, radiology testing, previous records reviewed. No current cardiopulmonary symptoms. Will update echocardiogram given prior episode of dizziness, and per  patient preference. If that is stable compared to prior, will consider patient optimized. The patient is intermediate risk for intermediate risk procedure.  Will obtain echocardiogram, if abnormal will update risk assessment.  Repeat EKG.  ADDENDUM: Echocardiogram personally reviewed with ejection fraction 65%, normal right ventricular size and function, grossly normal aortic valve.  LVOT TVI flow is elevated suggesting a high flow state given normal TSH and normal hemoglobin, suspect her slight murmur noted on exam is flow related and benign.  Diastolic function is likely normal with medial e' of 7 cm/s.  Echocardiogram is also unchanged from prior. EKG reviewed demonstrating sinus bradycardia. Patient should be considered optimized from a cardiovascular standpoint.  Anesthesia team should be alert to patient's baseline bradycardia and paroxysmal atrial fibrillation.   Elouise Munroe, MD 04/09/22 1:43 PM

## 2022-04-09 NOTE — ED Provider Notes (Signed)
  Provider Note MRN:  [id number]  Arrival date & time: 04/09/22    ED Course and Medical Decision Making  Assumed care from [place] at shift change.  See note from prior team for complete details, in brief:  66 yo female  She has cholecystitis Dr Bridgett Larsson requesting MRCP prior to accepting admission Surgery has seen the pt, plan for OR eval MRCP has resulted, no choledoco, findings c/w acute cholecystitis Spoke with Dr Olevia Bowens, accepts pt for admission for acute cholecystitis, gen surg on consult.    Procedures  Final Clinical Impressions(s) / ED Diagnoses     ICD-10-CM   1. Cholecystitis  K81.9       ED Discharge Orders     None       Discharge Instructions   None        Jeanell Sparrow, DO 04/09/22 1102

## 2022-04-09 NOTE — ED Provider Notes (Signed)
[facility] DEPT Provider Note   CSN: [id number] Arrival date & time: 04/09/22  0037     History  Chief Complaint  Patient presents with   Abdominal Pain    Shannon Navarro is a 66 y.o. female.  Presents to the emergency department for evaluation of upper abdominal pain that radiates into her back.  Patient reports associated nausea and vomiting.  Patient has a known history of gallstones, currently being worked up for outpatient cholecystectomy.  Patient reports the pain is significantly worse tonight than it has been.       Home Medications Prior to Admission medications   Medication Sig Start Date End Date Taking? Authorizing Provider  Caraway Oil-Levomenthol (FDGARD) 25-20.75 MG CAPS Use as directed 02/17/22   Vladimir Crofts, PA-C  cetirizine (ZYRTEC) 10 MG tablet Take 10 mg by mouth daily.    [provider]  Cholecalciferol (VITAMIN D) 125 MCG (5000 UT) CAPS Take 5,000 Units by mouth daily.    [provider]  doxycycline (VIBRA-TABS) 100 MG tablet Take 100 mg by mouth 3 (three) times daily. 03/17/22   [provider]  fluticasone (FLONASE) 50 MCG/ACT nasal spray Place 2 sprays into both nostrils daily. Patient taking differently: Place 2 sprays into both nostrils daily as needed for allergies. 02/18/21   [name], MD  HYDROcodone-acetaminophen (HYCET) 7.5-325 mg/15 ml solution Take 15 mLs by mouth every 6 (six) hours as needed for moderate pain. Patient not taking: Reported on 03/26/2022 01/10/22   Izora Gala, MD  losartan (COZAAR) 100 MG tablet Take 1 tablet (100 mg total) by mouth daily. 09/02/21   Lelon Perla, MD  ondansetron (ZOFRAN-ODT) 4 MG disintegrating tablet Take 1 tablet (4 mg total) by mouth every 8 (eight) hours as needed for nausea or vomiting. 01/10/22   Izora Gala, MD  pantoprazole (PROTONIX) 40 MG tablet TAKE 1 TABLET BY MOUTH TWICE A DAY BEFORE A MEAL Patient taking  differently: Take 40 mg by mouth 2 (two) times daily. 08/27/21   Mauri Pole, MD  propranolol (INDERAL) 20 MG tablet Take 1 tablet by mouth three times daily as needed Patient taking differently: Take 20 mg by mouth daily as needed (If HR >120). 01/14/21   Minus Breeding, MD  venlafaxine XR (EFFEXOR XR) 75 MG 24 hr capsule Take 1 capsule (75 mg total) by mouth daily with breakfast. Patient not taking: Reported on 03/26/2022 12/26/21   Biagio Borg, MD      Allergies    Cefaclor, Codeine, Hydrocodone, Levaquin [levofloxacin], Nexium [esomeprazole magnesium], Penicillins, and Sulfa antibiotics    Review of Systems   Review of Systems  Physical Exam Updated Vital Signs BP (!) 143/74   Pulse (!) 47   Temp 98.5 F (36.9 C) (Oral)   Resp 18   SpO2 97%  Physical Exam Vitals and nursing note reviewed.  Constitutional:      General: She is not in acute distress.    Appearance: She is well-developed.  HENT:     Head: Normocephalic and atraumatic.     Mouth/Throat:     Mouth: Mucous membranes are moist.  Eyes:     General: Vision grossly intact. Gaze aligned appropriately.     Extraocular Movements: Extraocular movements intact.     Conjunctiva/sclera: Conjunctivae normal.  Cardiovascular:     Rate and Rhythm: Normal rate and regular rhythm.     Pulses: Normal pulses.     Heart sounds: Normal heart sounds, S1  normal and S2 normal. No murmur heard.    No friction rub. No gallop.  Pulmonary:     Effort: Pulmonary effort is normal. No respiratory distress.     Breath sounds: Normal breath sounds.  Abdominal:     General: Bowel sounds are normal.     Palpations: Abdomen is soft.     Tenderness: There is abdominal tenderness in the right upper quadrant and epigastric area. There is no guarding or rebound.     Hernia: No hernia is present.  Musculoskeletal:        General: No swelling.     Cervical back: Full passive range of motion without pain, normal range of motion and neck  supple. No spinous process tenderness or muscular tenderness. Normal range of motion.     Right lower leg: No edema.     Left lower leg: No edema.  Skin:    General: Skin is warm and dry.     Capillary Refill: Capillary refill takes less than 2 seconds.     Findings: No ecchymosis, erythema, rash or wound.  Neurological:     General: No focal deficit present.     Mental Status: She is alert and oriented to person, place, and time.     GCS: GCS eye subscore is 4. GCS verbal subscore is 5. GCS motor subscore is 6.     Cranial Nerves: Cranial nerves 2-12 are intact.     Sensory: Sensation is intact.     Motor: Motor function is intact.     Coordination: Coordination is intact.  Psychiatric:        Attention and Perception: Attention normal.        Mood and Affect: Mood normal.        Speech: Speech normal.        Behavior: Behavior normal.     ED Results / Procedures / Treatments   Labs (all labs ordered are listed, but only abnormal results are displayed) Labs Reviewed  COMPREHENSIVE METABOLIC PANEL - Abnormal; Notable for the following components:      Result Value   Glucose, Bld 152 (*)    AST 200 (*)    ALT 137 (*)    Total Bilirubin 2.3 (*)    All other components within normal limits  URINALYSIS, ROUTINE W REFLEX MICROSCOPIC - Abnormal; Notable for the following components:   Color, Urine AMBER (*)    Ketones, ur 5 (*)    All other components within normal limits  LIPASE, BLOOD  CBC    EKG EKG Interpretation  Date/Time:  Wednesday April 09 2022 01:25:50 EST Ventricular Rate:  47 PR Interval:  141 QRS Duration: 100 QT Interval:  489 QTC Calculation: 433 R Axis:   16 Text Interpretation: Sinus bradycardia Low voltage, precordial leads Confirmed by Orpah Greek [phone]) on 04/09/2022 4:33:12 AM  Radiology US ABDOMEN LIMITED RUQ (LIVER/GB)  Result Date: 04/09/2022 CLINICAL DATA:  66 year old female with history of right upper quadrant abdominal  pain for the past 5 months. EXAM: ULTRASOUND ABDOMEN LIMITED RIGHT UPPER QUADRANT COMPARISON:  Abdominal ultrasound 03/25/2022. FINDINGS: Gallbladder: Echogenic foci with posterior acoustic shadowing noted lying dependently in the gallbladder, measuring up to 1.9 cm in diameter. Gallbladder is moderately distended. Gallbladder wall thickness is upper limits of normal at 3 mm. Trace amount of pericholecystic fluid. However, per report from the sonographer, there was no sonographic Murphy's sign on examination. Common bile duct: Diameter: 8.5 mm. Liver: No focal lesion identified. Within normal limits  in parenchymal echogenicity. Portal vein is patent on color Doppler imaging with normal direction of blood flow towards the liver. Other: None. IMPRESSION: 1. Cholelithiasis with trace volume of pericholecystic fluid, but no frank sonographic Murphy's sign on examination. Findings are equivocal, but not strongly suggestive of acute cholecystitis given the lack of sonographic Murphy's sign. 2. However, there is dilatation of the common bile duct (8.5 mm). If there is clinical concern for biliary tract obstruction from choledocholithiasis, further evaluation with abdominal MRI with and without IV gadolinium with MRCP should be considered at this time. Electronically Signed   By: Vinnie Langton M.D.   On: 04/09/2022 05:53    Procedures Procedures    Medications Ordered in ED Medications  ciprofloxacin (CIPRO) IVPB 400 mg (has no administration in time range)  HYDROmorphone (DILAUDID) injection 1 mg (1 mg Intravenous Given 04/09/22 0349)  ondansetron (ZOFRAN) injection 4 mg (4 mg Intravenous Given 04/09/22 0349)  metoCLOPramide (REGLAN) injection 10 mg (10 mg Intravenous Given 04/09/22 0516)    ED Course/ Medical Decision Making/ A&P                           Medical Decision Making Amount and/or Complexity of Data Reviewed External Data Reviewed: labs, radiology and notes. Labs: ordered.  Decision-making details documented in ED Course. Radiology: ordered and independent interpretation performed. Decision-making details documented in ED Course. ECG/medicine tests: ordered and independent interpretation performed. Decision-making details documented in ED Course.  Risk Prescription drug management. Decision regarding hospitalization.   Presents to the emergency department for evaluation of abdominal pain.  Patient reports that she was diagnosed with a gallstone and is being worked up as an outpatient for cholecystectomy.  She had to wear a Holter monitor because she has had some episodes of bradycardia, was awaiting the report for that before surgery could be scheduled.  Examination today concerning for cholecystitis.  Patient with moderate to severe tenderness in the right upper quadrant.  Pain radiating into the back.  Patient's transaminases, total bilirubin are slightly elevated.  They were at normal 2 weeks ago.  Patient's pain is significantly improved after analgesia.  Continue to have nausea and vomiting, given Reglan.  Sound performed to further evaluate.  Patient does have signs of early cholecystitis.  She does also have a mildly elevated common bile duct.  Discussed with Dr. Thermon Leyland, MRCP versus intraoperative cholangiogram.  Based on patient's other medical problems including ongoing bradycardia, asks for medical admission.  Discussed with Dr. Bridgett Larsson.  He feels that cardiology would need to give operative clearance, not medicine.  If there is no stone, medicine does not need to be the admission service.  He will sign out to oncoming hospitalist team.  He will order MRCP to further delineate.        Final Clinical Impression(s) / ED Diagnoses Final diagnoses:  Cholecystitis    Rx / DC Orders ED Discharge Orders     None         Annebelle Bostic, Gwenyth Allegra, MD 04/09/22 [phone]

## 2022-04-09 NOTE — Progress Notes (Signed)
  Echocardiogram 2D Echocardiogram has been performed.  Shannon Navarro 04/09/2022, 1:55 PM

## 2022-04-09 NOTE — Telephone Encounter (Signed)
Spoke with patient and she is currently in the hospital

## 2022-04-09 NOTE — ED Triage Notes (Signed)
Patient arrived with complaints of NV and upper abdominal pain that radiates around to her back. States she is supposed to have her gallbladder removed.

## 2022-04-09 NOTE — ED Notes (Signed)
Patient transported to MRI 

## 2022-04-09 NOTE — Telephone Encounter (Signed)
Currently in the hospital.

## 2022-04-09 NOTE — Consult Note (Signed)
Reason for Consult:cholecystitis Referring Provider: Muranda Coye is an 66 y.o. female.  HPI: 66 yo female with intermittent epigastric pain that radiates around her back. She saw Dr. Harlow Asa in the office 2 weeks ago for similar complaints. Last night she had the worst attack yet that lasted more than an hour with multiple episodes of nausea and vomiting.   Past Medical History:  Diagnosis Date   Allergic rhinitis    Anticoagulant long-term use    XARELTO   ANXIETY    Dysrhythmia    hx of atrial fib x 2 per pt   Endometrial polyp    Endometrial polyp    Fatty liver 06/23/2007   GERD    Herniated disc, 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   History of esophageal stricture    mainly due to gerd   History of exercise stress test 06/13/2015   PER DR HOCHREIN NOTE -- NO EVIDENCE ISCHEMIA   HYPERTENSION    Impaired fasting glucose    Migraine    hx of migraines none recent   OSA on CPAP followed by dr Halford Chessman   per study 02-09-2012  severe osa w/ AHI 33.8   Paroxysmal atrial fibrillation ([place]) cardioloigst-  dr hochrein   dx 02-21-2016 at ED   Pneumonia    with COVID   PONV (postoperative nausea and vomiting)    after surgery in 10/2021- had nausea after surgery related to preop drink   Pre-diabetes    Pseudotumor cerebri 2007   S/P cervical spinal fusion 10/24/2021   Sleep apnea    Vitamin D deficiency 07/2013    Past Surgical History:  Procedure Laterality Date   ABDOMINAL HYSTERECTOMY     ANTERIOR CERVICAL DECOMPRESSION/DISCECTOMY FUSION 4 LEVELS N/A 10/24/2021   Procedure: ANTERIOR CERVICAL DECOMPRESSION FUSION CERVICAL FOUR THROUGH CERVICAL FIVE, CERVICAL FIVE THROUGH CERVICAL SIX, CERVICAL SIX THROUGH CERVICAL SEVEN WITH INSTRUMENTATION AND ALLOGRAFT;  Surgeon: Phylliss Bob, MD;  Location: [facility];  Service: Orthopedics;  Laterality: N/A;   BREAST BIOPSY Right 08/12/2019    FIBROADENOMA    COLONOSCOPY     colonscopy  2007   normal   DILATATION &  CURETTAGE/HYSTEROSCOPY WITH MYOSURE N/A 03/26/2017   Procedure: DILATATION & CURETTAGE/HYSTEROSCOPY WITH MYOSURE;  Surgeon: Paula Compton, MD;  Location: [facility];  Service: Gynecology;  Laterality: N/A;   DILATION AND CURETTAGE OF UTERUS  2010   FRACTURE SURGERY     LAPAROSCOPIC HYSTERECTOMY Bilateral 06/18/2017   Procedure: HYSTERECTOMY TOTAL LAPAROSCOPIC;  Surgeon: Paula Compton, MD;  Location: [facility];  Service: Gynecology;  Laterality: Bilateral;  OUT PT IN BED, needs extra time MD request 2.5hrs in OR   right knee arthroscopy  03/2013   TONSILLECTOMY Bilateral 01/10/2022   Procedure: TONSILLECTOMY;  Surgeon: Izora Gala, MD;  Location: [facility];  Service: ENT;  Laterality: Bilateral;   torn meniscus surgery on right knee      TRANSTHORACIC ECHOCARDIOGRAM  03/11/2016   ef 60-65%/  mild MR/ trivial PR and TR   TUBAL LIGATION     UPPER GASTROINTESTINAL ENDOSCOPY  11/2020    Family History  Problem Relation Age of Onset   Breast cancer Mother        32   Asthma Mother    Hypertension Mother    Prostate cancer Father    Aortic stenosis Father    Hypertension Father    Colon polyps Father    Heart disease Father    Diabetes Father  Breast cancer Maternal Aunt    Breast cancer Maternal Aunt    Esophageal cancer Other    Stomach cancer Other    Colon cancer Other    Breast cancer Niece        102s   Rectal cancer Neg Hx     Social History:  reports that she has never smoked. She has never used smokeless tobacco. She reports that she does not drink alcohol and does not use drugs.  Allergies:  Allergies  Allergen Reactions   Cefaclor Rash   Codeine Nausea And Vomiting   Hydrocodone Nausea And Vomiting    Severe vomiting/  COUGH SYRUP CAUSE SEVERE VOMITING   Levaquin [Levofloxacin] Nausea Only   Nexium [Esomeprazole Magnesium] Other (See Comments)    Made legs ache   Penicillins Rash   Sulfa Antibiotics Rash    Medications: I  have reviewed the patient's current medications.  Results for orders placed or performed during the hospital encounter of 04/09/22 (from the past 48 hour(s))  Urinalysis, Routine w reflex microscopic     Status: Abnormal   Collection Time: 04/09/22  1:05 AM  Result Value Ref Range   Color, Urine AMBER (A) YELLOW    Comment: BIOCHEMICALS MAY BE AFFECTED BY COLOR   APPearance CLEAR CLEAR   Specific Gravity, Urine 1.029 1.005 - 1.030   pH 5.0 5.0 - 8.0   Glucose, UA NEGATIVE NEGATIVE mg/dL   Hgb urine dipstick NEGATIVE NEGATIVE   Bilirubin Urine NEGATIVE NEGATIVE   Ketones, ur 5 (A) NEGATIVE mg/dL   Protein, ur NEGATIVE NEGATIVE mg/dL   Nitrite NEGATIVE NEGATIVE   Leukocytes,Ua NEGATIVE NEGATIVE    Comment: Performed at [facility], [place] [address], [place], [place] [address]  Lipase, blood     Status: None   Collection Time: 04/09/22  1:36 AM  Result Value Ref Range   Lipase 41 11 - 51 U/L    Comment: Performed at [facility], [place] [address], [place], [place] [address]  Comprehensive metabolic panel     Status: Abnormal   Collection Time: 04/09/22  1:36 AM  Result Value Ref Range   Sodium 139 135 - 145 mmol/L   Potassium 3.7 3.5 - 5.1 mmol/L   Chloride 108 98 - 111 mmol/L   CO2 26 22 - 32 mmol/L   Glucose, Bld 152 (H) 70 - 99 mg/dL    Comment: Glucose reference range applies only to samples taken after fasting for at least 8 hours.   BUN 17 8 - 23 mg/dL   Creatinine, Ser 0.75 0.44 - 1.00 mg/dL   Calcium 9.3 8.9 - 10.3 mg/dL   Total Protein 7.2 6.5 - 8.1 g/dL   Albumin 3.8 3.5 - 5.0 g/dL   AST 200 (H) 15 - 41 U/L   ALT 137 (H) 0 - 44 U/L   Alkaline Phosphatase 119 38 - 126 U/L   Total Bilirubin 2.3 (H) 0.3 - 1.2 mg/dL   GFR, Estimated >60 >60 mL/min    Comment: (NOTE) Calculated using the CKD-EPI Creatinine Equation (2021)    Anion gap 5 5 - 15    Comment: Performed at [facility], [place] [address], [place],  [place] [address]  CBC     Status: None   Collection Time: 04/09/22  1:36 AM  Result Value Ref Range   WBC 8.2 4.0 - 10.5 K/uL   RBC 4.73 3.87 - 5.11 MIL/uL   Hemoglobin 13.5 12.0 - 15.0 g/dL  HCT 41.6 36.0 - 46.0 %   MCV 87.9 80.0 - 100.0 fL   MCH 28.5 26.0 - 34.0 pg   MCHC 32.5 30.0 - 36.0 g/dL   RDW 13.0 11.5 - 15.5 %   Platelets 231 150 - 400 K/uL   nRBC 0.0 0.0 - 0.2 %    Comment: Performed at [facility], [place] [address], [place], [place] [address]    US ABDOMEN LIMITED RUQ (LIVER/GB)  Result Date: 04/09/2022 CLINICAL DATA:  66 year old female with history of right upper quadrant abdominal pain for the past 5 months. EXAM: ULTRASOUND ABDOMEN LIMITED RIGHT UPPER QUADRANT COMPARISON:  Abdominal ultrasound 03/25/2022. FINDINGS: Gallbladder: Echogenic foci with posterior acoustic shadowing noted lying dependently in the gallbladder, measuring up to 1.9 cm in diameter. Gallbladder is moderately distended. Gallbladder wall thickness is upper limits of normal at 3 mm. Trace amount of pericholecystic fluid. However, per report from the sonographer, there was no sonographic Murphy's sign on examination. Common bile duct: Diameter: 8.5 mm. Liver: No focal lesion identified. Within normal limits in parenchymal echogenicity. Portal vein is patent on color Doppler imaging with normal direction of blood flow towards the liver. Other: None. IMPRESSION: 1. Cholelithiasis with trace volume of pericholecystic fluid, but no frank sonographic Murphy's sign on examination. Findings are equivocal, but not strongly suggestive of acute cholecystitis given the lack of sonographic Murphy's sign. 2. However, there is dilatation of the common bile duct (8.5 mm). If there is clinical concern for biliary tract obstruction from choledocholithiasis, further evaluation with abdominal MRI with and without IV gadolinium with MRCP should be considered at this time. Electronically Signed   By: Vinnie Langton M.D.    On: 04/09/2022 05:53    Review of Systems  Constitutional: Negative.   HENT: Negative.    Eyes: Negative.   Respiratory: Negative.    Cardiovascular: Negative.   Gastrointestinal:  Positive for abdominal pain, nausea and vomiting.  Genitourinary: Negative.   Musculoskeletal: Negative.   Skin: Negative.   Neurological: Negative.   Endo/Heme/Allergies: Negative.   Psychiatric/Behavioral: Negative.      PE Blood pressure 121/66, pulse 89, temperature 97.7 F (36.5 C), temperature source Oral, resp. rate 18, SpO2 96 %. Constitutional: NAD; conversant; no deformities Eyes: Moist conjunctiva; no lid lag; anicteric; PERRL Neck: Trachea midline; no thyromegaly Lungs: Normal respiratory effort; no tactile fremitus CV: RRR; no palpable thrills; no pitting edema GI: Abd soft, slight pain to palpation in RUQ; no palpable hepatosplenomegaly MSK: Normal gait; no clubbing/cyanosis Psychiatric: Appropriate affect; alert and oriented x3 Lymphatic: No palpable cervical or axillary lymphadenopathy Skin: No major subcutaneous nodules. Warm and dry    Recent cardiac monitor: Patient had a min HR of 32 bpm, max HR of 148 bpm, and avg HR of 47 bpm. Predominant underlying rhythm was Sinus Rhythm. 5 Supraventricular Tachycardia runs occurred, the run with the fastest interval lasting 8 beats with a max rate of 148 bpm, the  longest lasting 13 beats with an avg rate of 102 bpm. Isolated SVEs were rare (<1.0%), SVE Couplets were rare (<1.0%), and SVE Triplets were rare (<1.0%). Isolated VEs were rare (<1.0%), and no VE Couplets or VE Triplets were present.     Assessment/Plan: 66 yo female with acute cholecystitis and bilirubin elevation. She has persistent pain and large stone seen on imaging. -agree with plan to follow up with [facility] for these monitor results -plan for lap chole while inpatient. We discussed the etiology of her pain, we discussed treatment options  and recommended surgery. We  discussed details of surgery including general anesthesia, laparoscopic approach, identification of cystic duct and common bile duct. Ligation of cystic duct and cystic artery. Possible need for intraoperative cholangiogram or open procedure. Possible risks of common bile duct injury, liver injury, cystic duct leak, bleeding, infection, post-cholecystectomy syndrome. The patient showed good understanding and all questions were answered   I reviewed last 24 h vitals and pain scores, last 48 h intake and output, last 24 h labs and trends, and last 24 h imaging results.  This care required high  level of medical decision making.   Arta Bruce Laney Louderback 04/09/2022, 8:40 AM

## 2022-04-09 NOTE — ED Notes (Signed)
Receiving RN received report.  No questions.  She will continue to monitor for s/sx of allergic reaction to the cipro.  IV in the right arm d/c due to itching and leaking at the site.  New Iv placed to the left forearm.  Patient denies any itching at this new site.

## 2022-04-09 NOTE — H&P (Signed)
History and Physical    Patient: Shannon Navarro SWN:[id number] DOB: 07-09-1955 DOA: 04/09/2022 DOS: the patient was seen and examined on 04/09/2022 PCP: [name], MD  Patient coming from: Home  Chief Complaint:  Chief Complaint  Patient presents with   Abdominal Pain   HPI: Shannon Navarro is a 66 y.o. female with medical history significant of allergy rhinitis, anxiety, paroxysmal atrial fibrillation, endometrial polyp, fatty liver disease, GERD, herniated cervical disc, history of cervical spine fusion, esophageal stricture, hypertension, impaired fasting glucose, migraine headaches, class II obesity, OSA on CPAP, pseudotumor cerebri, vitamin D deficiency was coming to the emergency department due to on and off epigastric/RUQ abdominal pain for the past few weeks.  She was seen in the emergency department on 03/25/2022 due to syncopal episode in the setting of bradycardia in the 30s and hypotension while at the orthopedic surgeon's office where she was treated with 500 mL of normal saline and a milligram of atropine by EMS.  Symptoms have resolved by the time that she arrived to the emergency department.  She was followed by Dr. [name] next day who performed a 3-day cardiac monitor on her.  She returned early morning today after having the worst episode of pain associated with multiple episodes of emesis.  And she has been bradycardic.  No fever, chills or night sweats. No sore throat, rhinorrhea, dyspnea, wheezing or hemoptysis.  No chest pain, palpitations, diaphoresis, PND, orthopnea or pitting edema of the lower extremities.  No diarrhea, constipation, melena or hematochezia.  No flank pain, dysuria, frequency or hematuria, but stated her urine has been darker.  No polyuria, polydipsia, polyphagia or blurred vision.  ED course: Initial vital signs were temperature 98.5 F, pulse 46, respiration 18, BP 150/67 mmHg O2 sat 100% on room air.  The patient received ciprofloxacin  400 mg IVPB, hydromorphone 1 mg IVP, lorazepam 1 mg IVP, metoclopramide 10 mg IVP and ondansetron 4 mg IVP.  General surgery requested hospitalist service admission and cardiology preop evaluation due to bradycardia.  Lab work: Her urinalysis showed ketonuria 5 mg/dL and was amber in color, otherwise was within normal range.  CBC with a white count of 8.2, hemoglobin 13.5 g/dL platelets 231.  Lipase was normal.  CMP with normal electrolytes and renal function.  Glucose 152 and total bilirubin 2.3 mg/dL.  AST was 200 and ALT 137 units/L.  The rest of the LFTs were normal.  Imaging: RUQ ultrasound showed cholelithiasis with trace body no pericholecystic fluid, but no frank sonographic Morphis sign on examination.  However there is dilatation of the CBD (8.5 mm).  MRCP again with cholelithiasis and mild pericholecystic fluid suspicious for acute cholecystitis.  There was mild extrahepatic biliary dilatation without evidence of choledocholithiasis.  Moderate size hiatal hernia.  No other significant abdominal finding.   Review of Systems: As mentioned in the history of present illness. All other systems reviewed and are negative. Past Medical History:  Diagnosis Date   Allergic rhinitis    Anticoagulant long-term use    XARELTO   ANXIETY    Dysrhythmia    hx of atrial fib x 2 per pt   Endometrial polyp    Endometrial polyp    Fatty liver 06/23/2007   GERD    Herniated disc, 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   History of esophageal stricture    mainly due to gerd   History of exercise stress test 06/13/2015   PER DR HOCHREIN NOTE -- NO EVIDENCE ISCHEMIA   HYPERTENSION  Impaired fasting glucose    Migraine    hx of migraines none recent   OSA on CPAP followed by dr Halford Chessman   per study 02-09-2012  severe osa w/ AHI 33.8   Paroxysmal atrial fibrillation [facility]) cardioloigst-  dr hochrein   dx 02-21-2016 at ED   Pneumonia    with COVID   PONV (postoperative nausea and vomiting)    after surgery in 10/2021-  had nausea after surgery related to preop drink   Pre-diabetes    Pseudotumor cerebri 2007   S/P cervical spinal fusion 10/24/2021   Sleep apnea    Vitamin D deficiency 07/2013   Past Surgical History:  Procedure Laterality Date   ABDOMINAL HYSTERECTOMY     ANTERIOR CERVICAL DECOMPRESSION/DISCECTOMY FUSION 4 LEVELS N/A 10/24/2021   Procedure: ANTERIOR CERVICAL DECOMPRESSION FUSION CERVICAL FOUR THROUGH CERVICAL FIVE, CERVICAL FIVE THROUGH CERVICAL SIX, CERVICAL SIX THROUGH CERVICAL SEVEN WITH INSTRUMENTATION AND ALLOGRAFT;  Surgeon: Phylliss Bob, MD;  Location: [place];  Service: Orthopedics;  Laterality: N/A;   BREAST BIOPSY Right 08/12/2019    FIBROADENOMA    COLONOSCOPY     colonscopy  2007   normal   DILATATION & CURETTAGE/HYSTEROSCOPY WITH MYOSURE N/A 03/26/2017   Procedure: DILATATION & CURETTAGE/HYSTEROSCOPY WITH MYOSURE;  Surgeon: Paula Compton, MD;  Location: [place];  Service: Gynecology;  Laterality: N/A;   DILATION AND CURETTAGE OF UTERUS  2010   FRACTURE SURGERY     LAPAROSCOPIC HYSTERECTOMY Bilateral 06/18/2017   Procedure: HYSTERECTOMY TOTAL LAPAROSCOPIC;  Surgeon: Paula Compton, MD;  Location: [place];  Service: Gynecology;  Laterality: Bilateral;  OUT PT IN BED, needs extra time MD request 2.5hrs in OR   right knee arthroscopy  03/2013   TONSILLECTOMY Bilateral 01/10/2022   Procedure: TONSILLECTOMY;  Surgeon: Izora Gala, MD;  Location: [place];  Service: ENT;  Laterality: Bilateral;   torn meniscus surgery on right knee      TRANSTHORACIC ECHOCARDIOGRAM  03/11/2016   ef 60-65%/  mild MR/ trivial PR and TR   TUBAL LIGATION     UPPER GASTROINTESTINAL ENDOSCOPY  11/2020   Social History:  reports that she has never smoked. She has never used smokeless tobacco. She reports that she does not drink alcohol and does not use drugs.  Allergies  Allergen Reactions   Cefaclor Rash   Codeine Nausea And Vomiting   Hydrocodone  Nausea And Vomiting    Severe vomiting/  COUGH SYRUP CAUSE SEVERE VOMITING   Levaquin [Levofloxacin] Nausea Only   Nexium [Esomeprazole Magnesium] Other (See Comments)    Made legs ache   Penicillins Rash   Sulfa Antibiotics Rash    Family History  Problem Relation Age of Onset   Breast cancer Mother        52   Asthma Mother    Hypertension Mother    Prostate cancer Father    Aortic stenosis Father    Hypertension Father    Colon polyps Father    Heart disease Father    Diabetes Father    Breast cancer Maternal Aunt    Breast cancer Maternal Aunt    Esophageal cancer Other    Stomach cancer Other    Colon cancer Other    Breast cancer Niece        4s   Rectal cancer Neg Hx     Prior to Admission medications   Medication Sig Start Date End Date Taking? Authorizing Provider  Caraway Oil-Levomenthol (FDGARD) 25-20.75 MG CAPS Use  as directed 02/17/22   Vladimir Crofts, PA-C  cetirizine (ZYRTEC) 10 MG tablet Take 10 mg by mouth daily.    [provider]  Cholecalciferol (VITAMIN D) 125 MCG (5000 UT) CAPS Take 5,000 Units by mouth daily.    [provider]  doxycycline (VIBRA-TABS) 100 MG tablet Take 100 mg by mouth 3 (three) times daily. 03/17/22   [provider]  fluticasone (FLONASE) 50 MCG/ACT nasal spray Place 2 sprays into both nostrils daily. Patient taking differently: Place 2 sprays into both nostrils daily as needed for allergies. 02/18/21   [name], MD  HYDROcodone-acetaminophen (HYCET) 7.5-325 mg/15 ml solution Take 15 mLs by mouth every 6 (six) hours as needed for moderate pain. Patient not taking: Reported on 03/26/2022 01/10/22   Izora Gala, MD  losartan (COZAAR) 100 MG tablet Take 1 tablet (100 mg total) by mouth daily. 09/02/21   Lelon Perla, MD  ondansetron (ZOFRAN-ODT) 4 MG disintegrating tablet Take 1 tablet (4 mg total) by mouth every 8 (eight) hours as needed for nausea or vomiting. 01/10/22   Izora Gala, MD   pantoprazole (PROTONIX) 40 MG tablet TAKE 1 TABLET BY MOUTH TWICE A DAY BEFORE A MEAL Patient taking differently: Take 40 mg by mouth 2 (two) times daily. 08/27/21   Mauri Pole, MD  propranolol (INDERAL) 20 MG tablet Take 1 tablet by mouth three times daily as needed Patient taking differently: Take 20 mg by mouth daily as needed (If HR >120). 01/14/21   Minus Breeding, MD  venlafaxine XR (EFFEXOR XR) 75 MG 24 hr capsule Take 1 capsule (75 mg total) by mouth daily with breakfast. Patient not taking: Reported on 03/26/2022 12/26/21   Biagio Borg, MD    Physical Exam: Vitals:   04/09/22 0515 04/09/22 0630 04/09/22 0703 04/09/22 1008  BP: (!) 143/74 121/66  (!) 122/39  Pulse: (!) 47 89  (!) 47  Resp: '18 18  18  '$ Temp:   97.7 F (36.5 C)   TempSrc:   Oral   SpO2: 97% 96%  97%   Physical Exam Vitals and nursing note reviewed.  Constitutional:      General: She is awake. She is not in acute distress.    Appearance: She is obese.  HENT:     Head: Normocephalic.     Nose: No rhinorrhea.     Mouth/Throat:     Mouth: Mucous membranes are dry.  Eyes:     General: No scleral icterus.    Pupils: Pupils are equal, round, and reactive to light.  Neck:     Vascular: No JVD.  Cardiovascular:     Rate and Rhythm: Regular rhythm. Bradycardia present.     Heart sounds: S1 normal and S2 normal.  Pulmonary:     Effort: Pulmonary effort is normal.     Breath sounds: Normal breath sounds.  Abdominal:     General: Abdomen is protuberant. Bowel sounds are normal.     Palpations: Abdomen is soft.     Tenderness: There is abdominal tenderness in the right upper quadrant and epigastric area. There is no right CVA tenderness, left CVA tenderness, guarding or rebound. Negative signs include Murphy's sign.  Musculoskeletal:     Cervical back: Neck supple.     Right lower leg: No edema.     Left lower leg: No edema.  Skin:    General: Skin is warm and dry.  Neurological:     General: No  focal deficit present.  Mental Status: She is alert and oriented to person, place, and time.  Psychiatric:        Mood and Affect: Mood normal.        Behavior: Behavior normal. Behavior is cooperative.   Data Reviewed:  There are no new results to review at this time.  09/08/2019 echocardiogram IMPRESSIONS:   1. Left ventricular ejection fraction, by estimation, is 60 to 65%. The  left ventricle has normal function. The left ventricle has no regional  wall motion abnormalities. There is mild left ventricular hypertrophy.  Left ventricular diastolic parameters  are consistent with Grade I diastolic dysfunction (impaired relaxation).  The average left ventricular global longitudinal strain is -21.7 %. The  global longitudinal strain is normal.   2. Right ventricular systolic function is normal. The right ventricular  size is normal. There is normal pulmonary artery systolic pressure.   3. The mitral valve is abnormal. Trivial mitral valve regurgitation.   4. The aortic valve is tricuspid. Aortic valve regurgitation is not  visualized.   5. The inferior vena cava is normal in size with greater than 50%  respiratory variability, suggesting right atrial pressure of 3 mmHg.   3-day cardiac monitor Patch Wear Time:  2 days and 21 hours (2023-11-19T11:37:11-498 to 2023-11-22T08:51:18-0500)   Patient had a min HR of 32 bpm, max HR of 148 bpm, and avg HR of 47 bpm. Predominant underlying rhythm was Sinus Rhythm. 5 Supraventricular Tachycardia runs occurred, the run with the fastest interval lasting 8 beats with a max rate of 148 bpm, the  longest lasting 13 beats with an avg rate of 102 bpm. Isolated SVEs were rare (<1.0%), SVE Couplets were rare (<1.0%), and SVE Triplets were rare (<1.0%). Isolated VEs were rare (<1.0%), and no VE Couplets or VE Triplets were present.   EKG: Vent. rate 47 BPM PR interval 141 ms QRS duration 100 ms QT/QTcB 489/433 ms P-R-T axes 65 16 40 Sinus  bradycardia Low voltage, precordial leads  Assessment and Plan: Principal Problem:   Elevated LFTs Due to:   Acute cholecystitis Associated with:   Cholelithiasis Admit to telemetry/inpatient. Keep NPO. Continue IV fluids. Analgesics as needed. Antiemetics as needed. Famotidine 20 mg IVP twice daily. Continue ciprofloxacin 400 mg every 12 hours. Begin metronidazole 500 mg IVPB every 12 hours. Will likely need laparoscopic cholecystectomy. General surgery input appreciated.  Active Problems:   Anxiety Received lorazepam before MRI.    GERD On famotidine IVP twice daily.    Prediabetes Last hemoglobin A1c 6.0% on 12/26/2021. CBG monitoring every 6 hours while NPO.    Essential hypertension Currently NPO. Parenteral antihypertensives as needed.    OSA (obstructive sleep apnea) Continue CPAP at bedtime.    PAF (paroxysmal atrial fibrillation) (HCC) CHA2DS2-VASc Score of at least 4. Off anticoagulation. On propranolol as needed.     Advance Care Planning:   Code Status: Full Code   Consults: [place] surgery and cardiology.  Family Communication:   Severity of Illness: The appropriate patient status for this patient is INPATIENT. Inpatient status is judged to be reasonable and necessary in order to provide the required intensity of service to ensure the patient's safety. The patient's presenting symptoms, physical exam findings, and initial radiographic and laboratory data in the context of their chronic comorbidities is felt to place them at high risk for further clinical deterioration. Furthermore, it is not anticipated that the patient will be medically stable for discharge from the hospital within 2 midnights of admission.   *  I certify that at the point of admission it is my clinical judgment that the patient will require inpatient hospital care spanning beyond 2 midnights from the point of admission due to high intensity of service, high risk for  further deterioration and high frequency of surveillance required.*  Author: Reubin Milan, MD 04/09/2022 11:24 AM  For on call review [email or web address].   This document was prepared using Dragon voice recognition software and may contain some unintended transcription errors.

## 2022-04-10 ENCOUNTER — Other Ambulatory Visit: Payer: Self-pay

## 2022-04-10 ENCOUNTER — Inpatient Hospital Stay (HOSPITAL_COMMUNITY): Payer: Medicare Other

## 2022-04-10 ENCOUNTER — Encounter (HOSPITAL_COMMUNITY): Payer: Self-pay | Admitting: *Deleted

## 2022-04-10 ENCOUNTER — Inpatient Hospital Stay (HOSPITAL_COMMUNITY): Payer: Medicare Other | Admitting: Anesthesiology

## 2022-04-10 ENCOUNTER — Encounter (HOSPITAL_COMMUNITY)
Admission: EM | Disposition: A | Discharge status: Home / Self Care | Payer: Self-pay | Source: Home / Self Care | Attending: Internal Medicine

## 2022-04-10 DIAGNOSIS — I10 Essential (primary) hypertension: Secondary | ICD-10-CM | POA: Diagnosis not present

## 2022-04-10 DIAGNOSIS — K810 Acute cholecystitis: Secondary | ICD-10-CM | POA: Diagnosis not present

## 2022-04-10 DIAGNOSIS — Z9989 Dependence on other enabling machines and devices: Secondary | ICD-10-CM

## 2022-04-10 DIAGNOSIS — K819 Cholecystitis, unspecified: Secondary | ICD-10-CM

## 2022-04-10 DIAGNOSIS — G4733 Obstructive sleep apnea (adult) (pediatric): Secondary | ICD-10-CM | POA: Diagnosis not present

## 2022-04-10 HISTORY — PX: CHOLECYSTECTOMY: SHX55

## 2022-04-10 LAB — COMPREHENSIVE METABOLIC PANEL
ALT: 345 U/L — ABNORMAL HIGH (ref 0–44)
AST: 224 U/L — ABNORMAL HIGH (ref 15–41)
Albumin: 3.4 g/dL — ABNORMAL LOW (ref 3.5–5.0)
Alkaline Phosphatase: 134 U/L — ABNORMAL HIGH (ref 38–126)
Anion gap: 4 — ABNORMAL LOW (ref 5–15)
BUN: 11 mg/dL (ref 8–23)
CO2: 27 mmol/L (ref 22–32)
Calcium: 9 mg/dL (ref 8.9–10.3)
Chloride: 107 mmol/L (ref 98–111)
Creatinine, Ser: 0.87 mg/dL (ref 0.44–1.00)
GFR, Estimated: >60 mL/min (ref >60)
Glucose, Bld: 145 mg/dL — ABNORMAL HIGH (ref 70–99)
Potassium: 3.7 mmol/L (ref 3.5–5.1)
Sodium: 138 mmol/L (ref 135–145)
Total Bilirubin: 1.8 mg/dL — ABNORMAL HIGH (ref 0.3–1.2)
Total Protein: 6.8 g/dL (ref 6.5–8.1)

## 2022-04-10 LAB — CBC
HCT: 42.5 % (ref 36.0–46.0)
Hemoglobin: 13.6 g/dL (ref 12.0–15.0)
MCH: 28.7 pg (ref 26.0–34.0)
MCHC: 32 g/dL (ref 30.0–36.0)
MCV: 89.7 fL (ref 80.0–100.0)
Platelets: 229 10*3/uL (ref 150–400)
RBC: 4.74 MIL/uL (ref 3.87–5.11)
RDW: 13.2 % (ref 11.5–15.5)
WBC: 4.8 10*3/uL (ref 4.0–10.5)
nRBC: 0 % (ref 0.0–0.2)

## 2022-04-10 LAB — GLUCOSE, CAPILLARY
Glucose-Capillary: 103 mg/dL — ABNORMAL HIGH (ref 70–99)
Glucose-Capillary: 176 mg/dL — ABNORMAL HIGH (ref 70–99)
Glucose-Capillary: 86 mg/dL (ref 70–99)

## 2022-04-10 LAB — HIV ANTIBODY (ROUTINE TESTING W REFLEX): HIV Screen 4th Generation wRfx: NONREACTIVE

## 2022-04-10 SURGERY — LAPAROSCOPIC CHOLECYSTECTOMY
Anesthesia: General | Site: Abdomen

## 2022-04-10 MED ORDER — OXYCODONE HCL 5 MG PO TABS: 5.0000 mg | ORAL_TABLET | ORAL | Status: DC | PRN

## 2022-04-10 MED ORDER — CHLORHEXIDINE GLUCONATE CLOTH 2 % EX PADS: 6.0000 | MEDICATED_PAD | Freq: Once | CUTANEOUS | Status: AC

## 2022-04-10 MED ORDER — AMISULPRIDE (ANTIEMETIC) 5 MG/2ML IV SOLN
10.0000 mg | Freq: Once | INTRAVENOUS | Status: AC
  Administered 2022-04-10: 10 mg via INTRAVENOUS

## 2022-04-10 MED ORDER — FENTANYL CITRATE PF 50 MCG/ML IJ SOSY
PREFILLED_SYRINGE | INTRAMUSCULAR | Status: AC
  Filled 2022-04-10: qty 1

## 2022-04-10 MED ORDER — LACTATED RINGERS IR SOLN
Status: DC | PRN
  Administered 2022-04-10: 1000 mL

## 2022-04-10 MED ORDER — LACTATED RINGERS IV SOLN: INTRAVENOUS | Status: DC

## 2022-04-10 MED ORDER — CEFAZOLIN SODIUM-DEXTROSE 2-4 GM/100ML-% IV SOLN
2.0000 g | INTRAVENOUS | Status: AC
  Administered 2022-04-10: 2 g via INTRAVENOUS
  Filled 2022-04-10: qty 100

## 2022-04-10 MED ORDER — SUCCINYLCHOLINE CHLORIDE 200 MG/10ML IV SOSY
PREFILLED_SYRINGE | INTRAVENOUS | Status: DC | PRN
  Administered 2022-04-10: 120 mg via INTRAVENOUS

## 2022-04-10 MED ORDER — FENTANYL CITRATE (PF) 250 MCG/5ML IJ SOLN
INTRAMUSCULAR | Status: AC
  Filled 2022-04-10: qty 5

## 2022-04-10 MED ORDER — FENTANYL CITRATE PF 50 MCG/ML IJ SOSY
25.0000 ug | PREFILLED_SYRINGE | INTRAMUSCULAR | Status: DC | PRN
  Administered 2022-04-10 (×2): 25 ug via INTRAVENOUS

## 2022-04-10 MED ORDER — PSYLLIUM 95 % PO PACK
1.0000 | PACK | Freq: Every day | ORAL | Status: AC
  Administered 2022-04-10: 1 via ORAL
  Filled 2022-04-10: qty 1

## 2022-04-10 MED ORDER — DEXAMETHASONE SODIUM PHOSPHATE 10 MG/ML IJ SOLN
INTRAMUSCULAR | Status: DC | PRN
  Administered 2022-04-10: 5 mg via INTRAVENOUS

## 2022-04-10 MED ORDER — IOHEXOL 300 MG/ML  SOLN
INTRAMUSCULAR | Status: DC | PRN
  Administered 2022-04-10: 10 mL

## 2022-04-10 MED ORDER — OXYCODONE HCL 5 MG/5ML PO SOLN: 5.0000 mg | Freq: Once | ORAL | Status: DC | PRN

## 2022-04-10 MED ORDER — ORAL CARE MOUTH RINSE: 15.0000 mL | Freq: Once | OROMUCOSAL | Status: AC

## 2022-04-10 MED ORDER — INDOMETHACIN 50 MG RE SUPP
100.0000 mg | Freq: Once | RECTAL | Status: AC
  Administered 2022-04-10: 100 mg via RECTAL
  Filled 2022-04-10: qty 2

## 2022-04-10 MED ORDER — MIDAZOLAM HCL 2 MG/2ML IJ SOLN
INTRAMUSCULAR | Status: AC
  Filled 2022-04-10: qty 2

## 2022-04-10 MED ORDER — FENTANYL CITRATE (PF) 100 MCG/2ML IJ SOLN
INTRAMUSCULAR | Status: AC
  Filled 2022-04-10: qty 2

## 2022-04-10 MED ORDER — SUGAMMADEX SODIUM 500 MG/5ML IV SOLN
INTRAVENOUS | Status: DC | PRN
  Administered 2022-04-10: 500 mg via INTRAVENOUS

## 2022-04-10 MED ORDER — MIDAZOLAM HCL 5 MG/5ML IJ SOLN
INTRAMUSCULAR | Status: DC | PRN
  Administered 2022-04-10: 2 mg via INTRAVENOUS
  Administered 2022-04-10 (×2): 1 mg via INTRAVENOUS

## 2022-04-10 MED ORDER — OXYCODONE HCL 5 MG PO TABS: 5.0000 mg | ORAL_TABLET | Freq: Once | ORAL | Status: DC | PRN

## 2022-04-10 MED ORDER — ONDANSETRON HCL 4 MG/2ML IJ SOLN
INTRAMUSCULAR | Status: DC | PRN
  Administered 2022-04-10: 4 mg via INTRAVENOUS

## 2022-04-10 MED ORDER — FENTANYL CITRATE (PF) 100 MCG/2ML IJ SOLN
INTRAMUSCULAR | Status: DC | PRN
  Administered 2022-04-10: 100 ug via INTRAVENOUS
  Administered 2022-04-10 (×2): 50 ug via INTRAVENOUS

## 2022-04-10 MED ORDER — ACETAMINOPHEN 500 MG PO TABS: 1000.0000 mg | ORAL_TABLET | Freq: Three times a day (TID) | ORAL | Status: DC | PRN

## 2022-04-10 MED ORDER — SCOPOLAMINE 1 MG/3DAYS TD PT72
1.0000 | MEDICATED_PATCH | TRANSDERMAL | Status: DC
  Administered 2022-04-10: 1.5 mg via TRANSDERMAL
  Filled 2022-04-10: qty 1

## 2022-04-10 MED ORDER — PROPOFOL 10 MG/ML IV BOLUS
INTRAVENOUS | Status: DC | PRN
  Administered 2022-04-10: 130 mg via INTRAVENOUS

## 2022-04-10 MED ORDER — ROCURONIUM BROMIDE 10 MG/ML (PF) SYRINGE
PREFILLED_SYRINGE | INTRAVENOUS | Status: DC | PRN
  Administered 2022-04-10: 50 mg via INTRAVENOUS

## 2022-04-10 MED ORDER — CHLORHEXIDINE GLUCONATE 0.12 % MT SOLN
15.0000 mL | Freq: Once | OROMUCOSAL | Status: AC
  Administered 2022-04-10: 15 mL via OROMUCOSAL

## 2022-04-10 MED ORDER — HYDROMORPHONE HCL 1 MG/ML IJ SOLN: 0.5000 mg | INTRAMUSCULAR | Status: DC | PRN

## 2022-04-10 MED ORDER — HYDROMORPHONE HCL 1 MG/ML IJ SOLN: 0.2500 mg | INTRAMUSCULAR | Status: DC | PRN

## 2022-04-10 MED ORDER — BUPIVACAINE-EPINEPHRINE 0.25% -1:200000 IJ SOLN
INTRAMUSCULAR | Status: DC | PRN
  Administered 2022-04-10: 30 mL

## 2022-04-10 MED ORDER — LOSARTAN POTASSIUM 50 MG PO TABS
100.0000 mg | ORAL_TABLET | Freq: Every day | ORAL | Status: DC
  Administered 2022-04-10 – 2022-04-11 (×2): 100 mg via ORAL
  Filled 2022-04-10 (×2): qty 2

## 2022-04-10 MED ORDER — PROMETHAZINE HCL 25 MG/ML IJ SOLN: 6.2500 mg | INTRAMUSCULAR | Status: DC | PRN

## 2022-04-10 MED ORDER — LIDOCAINE 2% (20 MG/ML) 5 ML SYRINGE
INTRAMUSCULAR | Status: DC | PRN
  Administered 2022-04-10: 60 mg via INTRAVENOUS
  Administered 2022-04-10: 40 mg via INTRAVENOUS

## 2022-04-10 MED ORDER — MIDAZOLAM HCL 2 MG/2ML IJ SOLN: 0.5000 mg | Freq: Once | INTRAMUSCULAR | Status: DC | PRN

## 2022-04-10 MED ORDER — MEPERIDINE HCL 50 MG/ML IJ SOLN: 6.2500 mg | INTRAMUSCULAR | Status: DC | PRN

## 2022-04-10 MED ORDER — CHLORHEXIDINE GLUCONATE CLOTH 2 % EX PADS
6.0000 | MEDICATED_PAD | Freq: Once | CUTANEOUS | Status: AC
  Administered 2022-04-10: 6 via TOPICAL

## 2022-04-10 MED ORDER — BUPIVACAINE-EPINEPHRINE (PF) 0.25% -1:200000 IJ SOLN
INTRAMUSCULAR | Status: AC
  Filled 2022-04-10: qty 30

## 2022-04-10 MED ORDER — 0.9 % SODIUM CHLORIDE (POUR BTL) OPTIME
TOPICAL | Status: DC | PRN
  Administered 2022-04-10: 1000 mL

## 2022-04-10 MED ORDER — SODIUM CHLORIDE 0.9 % IV SOLN: INTRAVENOUS | Status: DC

## 2022-04-10 MED ORDER — AMISULPRIDE (ANTIEMETIC) 5 MG/2ML IV SOLN
INTRAVENOUS | Status: AC
  Filled 2022-04-10: qty 4

## 2022-04-10 MED ORDER — ALUM & MAG HYDROXIDE-SIMETH 200-200-20 MG/5ML PO SUSP
15.0000 mL | Freq: Four times a day (QID) | ORAL | Status: DC | PRN
  Administered 2022-04-10 – 2022-04-11 (×2): 15 mL via ORAL
  Filled 2022-04-10 (×2): qty 30

## 2022-04-10 MED ORDER — FENTANYL CITRATE (PF) 250 MCG/5ML IJ SOLN
INTRAMUSCULAR | Status: DC | PRN
  Administered 2022-04-10 (×5): 50 ug via INTRAVENOUS

## 2022-04-10 SURGICAL SUPPLY — 43 items
APPLIER CLIP 5 13 M/L LIGAMAX5 (MISCELLANEOUS) ×1
APPLIER CLIP ROT 10 11.4 M/L (STAPLE) ×1
BAG COUNTER SPONGE SURGICOUNT (BAG) IMPLANT
BENZOIN TINCTURE PRP APPL 2/3 (GAUZE/BANDAGES/DRESSINGS) IMPLANT
BNDG ADH 1X3 SHEER STRL LF (GAUZE/BANDAGES/DRESSINGS) IMPLANT
CABLE HIGH FREQUENCY MONO STRZ (ELECTRODE) ×1 IMPLANT
CHLORAPREP W/TINT 26 (MISCELLANEOUS) ×1 IMPLANT
CLIP APPLIE 5 13 M/L LIGAMAX5 (MISCELLANEOUS) ×1 IMPLANT
CLIP APPLIE ROT 10 11.4 M/L (STAPLE) IMPLANT
CLIP LIGATING HEM O LOK PURPLE (MISCELLANEOUS) IMPLANT
CLIP LIGATING HEMO O LOK GREEN (MISCELLANEOUS) ×1 IMPLANT
COVER MAYO STAND STRL (DRAPES) IMPLANT
COVER MAYO STAND XLG (MISCELLANEOUS) ×1 IMPLANT
COVER SURGICAL LIGHT HANDLE (MISCELLANEOUS) ×1 IMPLANT
DRAIN CHANNEL 19F RND (DRAIN) IMPLANT
DRAPE C-ARM 42X120 X-RAY (DRAPES) IMPLANT
DRAPE C-ARMOR (DRAPES) IMPLANT
EVACUATOR SILICONE 100CC (DRAIN) IMPLANT
GLOVE BIOGEL PI IND STRL 7.0 (GLOVE) ×1 IMPLANT
GLOVE SURG SS PI 7.0 STRL IVOR (GLOVE) ×1 IMPLANT
GOWN STRL REUS W/ TWL LRG LVL3 (GOWN DISPOSABLE) ×1 IMPLANT
GOWN STRL REUS W/TWL LRG LVL3 (GOWN DISPOSABLE) ×1
GRASPER SUT TROCAR 14GX15 (MISCELLANEOUS) ×1 IMPLANT
IRRIG SUCT STRYKERFLOW 2 WTIP (MISCELLANEOUS) ×1
IRRIGATION SUCT STRKRFLW 2 WTP (MISCELLANEOUS) ×1 IMPLANT
KIT BASIN OR (CUSTOM PROCEDURE TRAY) ×1 IMPLANT
KIT TURNOVER KIT A (KITS) IMPLANT
POUCH RETRIEVAL ECOSAC 10 (ENDOMECHANICALS) ×1 IMPLANT
POUCH RETRIEVAL ECOSAC 10MM (ENDOMECHANICALS) ×1
SCISSORS LAP 5X35 DISP (ENDOMECHANICALS) ×1 IMPLANT
SET CHOLANGIOGRAPH MIX (MISCELLANEOUS) IMPLANT
SET TUBE SMOKE EVAC HIGH FLOW (TUBING) ×1 IMPLANT
SLEEVE Z-THREAD 5X100MM (TROCAR) ×2 IMPLANT
SPIKE FLUID TRANSFER (MISCELLANEOUS) ×1 IMPLANT
STRIP CLOSURE SKIN 1/2X4 (GAUZE/BANDAGES/DRESSINGS) IMPLANT
SUT ETHILON 2 0 PS N (SUTURE) IMPLANT
SUT MNCRL AB 4-0 PS2 18 (SUTURE) ×1 IMPLANT
SUT VICRYL 0 ENDOLOOP (SUTURE) IMPLANT
TOWEL OR 17X26 10 PK STRL BLUE (TOWEL DISPOSABLE) ×1 IMPLANT
TOWEL OR NON WOVEN STRL DISP B (DISPOSABLE) IMPLANT
TRAY LAPAROSCOPIC (CUSTOM PROCEDURE TRAY) ×1 IMPLANT
TROCAR 11X100 Z THREAD (TROCAR) ×1 IMPLANT
TROCAR Z-THREAD OPTICAL 5X100M (TROCAR) ×1 IMPLANT

## 2022-04-10 NOTE — Anesthesia Preprocedure Evaluation (Addendum)
Anesthesia Evaluation  Patient identified by MRN, date of birth, ID band Patient awake    Reviewed: Allergy & Precautions, NPO status , Patient's Chart, lab work & pertinent test results, reviewed documented beta blocker date and time   History of Anesthesia Complications (+) PONV  Airway Mallampati: II  TM Distance: >3 FB Neck ROM: Full    Dental  (+) Dental Advisory Given   Pulmonary sleep apnea and Continuous Positive Airway Pressure Ventilation    breath sounds clear to auscultation       Cardiovascular hypertension, Pt. on medications and Pt. on home beta blockers (-) angina + dysrhythmias Atrial Fibrillation  Rhythm:Regular Rate:Normal  03/2022 ECHO: EF 60-65%, normal LVF, normal RVF, no significant valvular abnormalities   Neuro/Psych  Headaches  Anxiety     Pseudotumor cerebrii    GI/Hepatic ,GERD  Medicated and Poorly Controlled,,Elevated LFTs with acute cholecystitis Biliary dyskinesia   Endo/Other  BMI 35  Renal/GU negative Renal ROS     Musculoskeletal   Abdominal   Peds  Hematology negative hematology ROS (+)   Anesthesia Other Findings   Reproductive/Obstetrics                             Anesthesia Physical Anesthesia Plan  ASA: 3  Anesthesia Plan: General   Post-op Pain Management:    Induction: Intravenous and Rapid sequence  PONV Risk Score and Plan: 4 or greater and Ondansetron, Dexamethasone, Midazolam and Scopolamine patch - Pre-op  Airway Management Planned: Oral ETT  Additional Equipment: None  Intra-op Plan:   Post-operative Plan: Extubation in OR  Informed Consent: I have reviewed the patients History and Physical, chart, labs and discussed the procedure including the risks, benefits and alternatives for the proposed anesthesia with the patient or authorized representative who has indicated his/her understanding and acceptance.     Dental advisory  given  Plan Discussed with: CRNA and Surgeon  Anesthesia Plan Comments:        Anesthesia Quick Evaluation

## 2022-04-10 NOTE — Op Note (Signed)
PATIENT:  Shannon Navarro  66 y.o. female  PRE-OPERATIVE DIAGNOSIS:  cholecystitis  POST-OPERATIVE DIAGNOSIS:  cholecystitis  PROCEDURE:  Procedure(s): LAPAROSCOPIC CHOLECYSTECTOMY WITH INTRAOPERATIVE CHOLANGIOGRAM   SURGEON:  Arion Shankles, Arta Bruce, MD   ASSISTANT: none  ANESTHESIA:   local and general  Indications for procedure: Shannon Navarro is a 66 y.o. female with symptoms of Abdominal pain and Nausea and vomiting consistent with gallbladder disease, Confirmed by ultrasound.  Description of procedure: The patient was brought into the operative suite, placed supine. Anesthesia was administered with endotracheal tube. Patient was strapped in place and foot board was secured. All pressure points were offloaded by foam padding. The patient was prepped and draped in the usual sterile fashion.  A periumbilical incision was made and optical entry was used to enter the abdomen. 2 5 mm trocars were placed on in the right lateral space on in the right subcostal space. A 58m trocar was placed in the subxiphoid space. Marcaine was infused to the subxiphoid space and lateral upper right abdomen in the transversus abdominis plane. Next the patient was placed in reverse trendelenberg. The gallbladder appearedacutely inflamed. Omentum was adhered to the gallbladder and was taken down with cautery/blunt dissection.  The gallbladder was retracted cephalad and lateral. The peritoneum was reflected off the infundibulum working lateral to medial. The cystic duct and cystic artery were identified and further dissection revealed a critical view, due to concern for choledocholithiasis a cholangiogram was performed with ductotomy and cook catheter passed through a separate subcostal stab incision. The CBD was dilated with tapering distally. The CBD did not empty into the duodenum with a filling defect consistent with choledocholithiasis. The cystic duct and cystic artery were doubly clipped and ligated.    The gallbladder was removed off the liver bed with cautery. The Gallbladder was placed in a specimen bag. The gallbladder fossa was irrigated and hemostasis was applied with cautery. The gallbladder was removed via the 145mtrocar. The fascial defect was closed with interrupted 0 vicryl suture via laparoscopic trans-fascial suture passer. Pneumoperitoneum was removed, all trocar were removed. All incisions were closed with 4-0 monocryl subcuticular stitch. The patient woke from anesthesia and was brought to PACU in stable condition. All counts were correct  Findings: acute cholecystitis, dilated CBD with no emptying into the duodenum  Specimen: gallbladder  Blood loss: 20 ml  Local anesthesia: 30 ml Marcaine  Complications: none  PLAN OF CARE: Admit to inpatient , consult GI for potential ERCP, repeat labs in the morning  PATIENT DISPOSITION:  PACU - hemodynamically stable.   Lu[place], [place]

## 2022-04-10 NOTE — Anesthesia Procedure Notes (Signed)
Procedure Name: Intubation Date/Time: 04/10/2022 12:48 PM  Performed by: Cynda Familia, CRNAPre-anesthesia Checklist: Patient identified, Emergency Drugs available, Suction available and Patient being monitored Patient Re-evaluated:Patient Re-evaluated prior to induction Oxygen Delivery Method: Circle System Utilized Preoxygenation: Pre-oxygenation with 100% oxygen Induction Type: IV induction and Cricoid Pressure applied Ventilation: Mask ventilation without difficulty Laryngoscope Size: Miller and 2 Grade View: Grade I Tube type: Oral Number of attempts: 1 Airway Equipment and Method: Stylet and Oral airway Placement Confirmation: ETT inserted through vocal cords under direct vision, positive ETCO2 and breath sounds checked- equal and bilateral Secured at: 22 cm Tube secured with: Tape Dental Injury: Teeth and Oropharynx as per pre-operative assessment  Comments: IV induction Jaclson-- intubation AM CRNA "pinched left upper lip between teeth and lip-- pressure applied after intubation.Marland Kitchen teeth and oropharynx atraumatic-- teeth as preop bilat BS Glennon Mac

## 2022-04-10 NOTE — Progress Notes (Signed)
Progress Note   Patient: Shannon Navarro HYQ:[id number] DOB: [birth year] DOA: 04/09/2022     1 DOS: the patient was seen and examined on 04/10/2022   Brief hospital course: 66 y.o. female with medical history significant of allergy rhinitis, anxiety, paroxysmal atrial fibrillation, endometrial polyp, fatty liver disease, GERD, herniated cervical disc, history of cervical spine fusion, esophageal stricture, hypertension, impaired fasting glucose, migraine headaches, class II obesity, OSA on CPAP, pseudotumor cerebri, vitamin D deficiency was coming to the emergency department due to on and off epigastric/RUQ abdominal pain for the past few weeks.  She was seen in the emergency department on 03/25/2022 due to syncopal episode in the setting of bradycardia in the 30s and hypotension while at the orthopedic surgeon's office where she was treated with 500 mL of normal saline and a milligram of atropine by EMS.  Symptoms have resolved by the time that she arrived to the emergency department.  She was followed by Dr. [name] next day who performed a 3-day cardiac monitor on her.  She returned early morning today after having the worst episode of pain associated with multiple episodes of emesis.  And she has been bradycardic.  No fever, chills or night sweats. No sore throat, rhinorrhea, dyspnea, wheezing or hemoptysis.  No chest pain, palpitations, diaphoresis, PND, orthopnea or pitting edema of the lower extremities.  No diarrhea, constipation, melena or hematochezia.  No flank pain, dysuria, frequency or hematuria, but stated her urine has been darker.  No polyuria, polydipsia, polyphagia or blurred vision.   Assessment and Plan: Principal Problem:   Elevated LFTs Due to:   Acute cholecystitis Associated with:   Cholelithiasis -General Surgery Consulted -MRCP with evidence of acute cholecystitis, mild extrahepatic biliary dilation without choledocholithiasis -Pt now s/p lap chole 11/30 -GI consulted,  plan for ERCP 12/1 -recheck CMP in AM   Active Problems:   Anxiety -continue with analgesia as needed     GERD -Cont on famotidine IVP twice daily.     Prediabetes Last hemoglobin A1c 6.0% on 12/26/2021. -cont on SSI as needed -glycemic trends stable     Essential hypertension Had been continued on parenteral antihypertensives as needed while NPO BP suboptimally controlled. Will resume home losartan     OSA (obstructive sleep apnea) Continue CPAP at bedtime.     PAF (paroxysmal atrial fibrillation) (HCC) CHA2DS2-VASc Score of at least 4. Off anticoagulation. On propranolol as needed.      Subjective: Seen post-op. Without complaints this AM  Physical Exam: Vitals:   04/10/22 1430 04/10/22 1445 04/10/22 1500 04/10/22 1515  BP: (!) 150/73 (!) 154/70 (!) 170/67 (!) 163/71  Pulse: (!) 43 (!) 43 (!) 46 (!) 44  Resp: '15 13 14 16  '$ Temp:      TempSrc:      SpO2: 100% 100% 94% 100%  Weight:      Height:       General exam: Conversant, in no acute distress Respiratory system: normal chest rise, clear, no audible wheezing Cardiovascular system: regular rhythm, s1-s2 Gastrointestinal system: Nondistended, nontender, pos BS Central nervous system: No seizures, no tremors Extremities: No cyanosis, no joint deformities Skin: No rashes, no pallor Psychiatry: Affect normal // no auditory hallucinations   Data Reviewed:  Labs reviewed: Na 138, K 3.7, Cr 0.87, Hgb 13.6  Family Communication: Pt in room, family at bedside  Disposition: Status is: Inpatient Remains inpatient appropriate because: Severity of illness  Planned Discharge Destination: Home    Author: Marylu Lund, MD 04/10/2022 4:01 PM  For on call review [email or web address].

## 2022-04-10 NOTE — Consult Note (Addendum)
Referring Provider: Dr. Gurney Maxin Primary Care Physician:  [name], MD Primary Gastroenterologist:  [name]   Reason for Consultation: S/P cholecystectomy, positive IOC  HPI: Shannon Navarro is a 66 y.o. female with a past medical history of anxiety, hypertension, paroxysmal atrial fibrillation (not on anticoagulation), symptomatic bradycardia, OSA on CPAP, pseudotumor cerebri, hepatic steatosis, GERD, benign appearing esophageal stenosis and diverticulosis.  She was last seen in GI clinic 02/17/2022 by Vicie Mutters, PA-C due to having upper abdominal pain which radiated around to her back.  Labs showed elevated ALT level of 109.  RUQ sonogram 02/21/2022 showed cholelithiasis without biliary dilatation.  A CCK HIDA scan 03/07/2022 showed a reduced gallbladder ejection fraction of 29%.  She was referred to general surgeon Dr. Armandina Gemma and an outpatient cholecystectomy was planned after she completed a cardiac evaluation due to symptomatic bradycardia.  She presented to [facility] ED 04/09/2022 due to having worsening upper abdominal pain which radiated into her back with associated nausea and vomiting.  No hematemesis. Labs in the ED showed a WBC count 8.2.  Hemoglobin 13.5.  Hematocrit 41.6.  Platelet 231.  BUN 17.  Creatinine 0.75.  Total bili 2.3.  Alk phos 2.3.  AST 200.  ALT 137.  Lipase 41.  RUQ sonogram identified cholelithiasis with trace pericholecystic fluid suggestive of acute cholecystitis with dilatation of the CBD measuring 8.5 mm.  Abdominal MRI/MRCP showed cholelithiasis, mild extrahepatic biliary dilatation without evidence of choledocholithiasis.  A moderate size hiatal hernia was also noted.  She had recent symptomatic bradycardia with syncope therefore she was evaluated by cardiologist Dr. Margaretann Loveless 04/09/2022 and cardiac clearance was provided prior to proceeding with a cholecystectomy. She underwent a laparoscopic cholecystectomy by Dr.  Kieth Brightly earlier today IOC was positive.  A GI consult was requested for ERCP evaluation.   She has a history of GERD, dysphagia with a benign esophageal stenosis.  She takes Protonix 40 mg daily as needed and not on a consistent basis.  She intermittently has trouble swallowing foods such as chicken or bread.  Intermittent heartburn.  Her most recent EGD was 12/03/2020 which showed a benign-appearing esophageal stenosis which was dilated, stomach and duodenum were normal.  He passes a normal formed brown bowel movement most days.  She has intermittent constipation.  No rectal bleeding or black stools.  Her most recent colonoscopy was 06/25/2020 which showed diverticulosis and 1 polyp was removed from the ascending colon but biopsy showed polypoid colonic mucosa and not a true colon polyp.  Labs 04/10/2022: BBC 4.8.  Total bili 1.8.  Alk phos 134.  AST 224.  ALT 345.  MOST RECENT GI PROCEDURES:  EGD 12/03/2020: - Benign-appearing esophageal stenosis. Dilated. - Normal stomach. - Normal examined duodenum. - No specimens collected.  Colonoscopy 06/25/2020: One 5 mm polyp in the ascending colon, removed with a cold snare. Resected and retrieved. - Diverticulosis in the sigmoid colon and in the descending colon. - Non-bleeding internal hemorrhoids. POLYPOID COLONIC MUCOSA WITH ENLARGED LYMPHOID AGGREGATE. - NO ADENOMATOUS CHANGE OR CARCINOMA  EGD 06/25/2020: - Benign-appearing esophageal stenosis. Dilated. - Medium-sized hiatal hernia. - No gross lesions in the stomach. - Normal first portion of the duodenum and second portion of the duodenum. - No specimens collected.   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   History of esophageal stricture    mainly due  to gerd   History of exercise stress test 06/13/2015   PER DR HOCHREIN NOTE -- NO EVIDENCE ISCHEMIA   HYPERTENSION    Impaired fasting glucose    Migraine    hx of migraines none recent   OSA on CPAP followed by dr Halford Chessman   per study 02-09-2012  severe osa w/ AHI 33.8   Paroxysmal atrial fibrillation ([place]) cardioloigst-  dr hochrein   dx 02-21-2016 at ED   Pneumonia    with COVID   PONV (postoperative nausea and vomiting)    after surgery in 10/2021- had nausea after surgery related to preop drink   Pre-diabetes    Pseudotumor cerebri 2007   S/P cervical spinal fusion 10/24/2021   Sleep apnea    Vitamin D deficiency 07/2013    Past Surgical History:  Procedure Laterality Date   ABDOMINAL HYSTERECTOMY     ANTERIOR CERVICAL DECOMPRESSION/DISCECTOMY FUSION 4 LEVELS N/A 10/24/2021   Procedure: ANTERIOR CERVICAL DECOMPRESSION FUSION CERVICAL FOUR THROUGH CERVICAL FIVE, CERVICAL FIVE THROUGH CERVICAL SIX, CERVICAL SIX THROUGH CERVICAL SEVEN WITH INSTRUMENTATION AND ALLOGRAFT;  Surgeon: Phylliss Bob, MD;  Location: [place];  Service: Orthopedics;  Laterality: N/A;   BREAST BIOPSY Right 08/12/2019    FIBROADENOMA    COLONOSCOPY     colonscopy  2007   normal   DILATATION & CURETTAGE/HYSTEROSCOPY WITH MYOSURE N/A 03/26/2017   Procedure: DILATATION & CURETTAGE/HYSTEROSCOPY WITH MYOSURE;  Surgeon: Paula Compton, MD;  Location: [place];  Service: Gynecology;  Laterality: N/A;   DILATION AND CURETTAGE OF UTERUS  2010   FRACTURE SURGERY     LAPAROSCOPIC HYSTERECTOMY Bilateral 06/18/2017   Procedure: HYSTERECTOMY TOTAL LAPAROSCOPIC;  Surgeon: Paula Compton, MD;  Location: [place];  Service: Gynecology;  Laterality: Bilateral;  OUT PT IN BED, needs extra time MD request 2.5hrs in OR   right knee arthroscopy  03/2013   TONSILLECTOMY Bilateral 01/10/2022   Procedure: TONSILLECTOMY;  Surgeon: Izora Gala, MD;  Location: [place];  Service: ENT;  Laterality:  Bilateral;   torn meniscus surgery on right knee      TRANSTHORACIC ECHOCARDIOGRAM  03/11/2016   ef 60-65%/  mild MR/ trivial PR and TR   TUBAL LIGATION     UPPER GASTROINTESTINAL ENDOSCOPY  11/2020    Prior to Admission medications   Medication Sig Start Date End Date Taking? Authorizing Provider  [place] Oil-Levomenthol (FDGARD) 25-20.75 MG CAPS Use as directed Patient taking differently: Take 1 capsule by mouth daily. 02/17/22  Yes Vicie Mutters R, PA-C  cetirizine (ZYRTEC) 10 MG tablet Take 10 mg by mouth daily.   Yes [provider]  Cholecalciferol (VITAMIN D) 125 MCG (5000 UT) CAPS Take 5,000 Units by mouth daily.   Yes [provider]  doxycycline (VIBRA-TABS) 100 MG tablet Take 100 mg by mouth 3 (three) times daily. 03/17/22  Yes [provider]  fluticasone (FLONASE) 50 MCG/ACT nasal spray Place 2 sprays into both nostrils daily. Patient taking differently: Place 2 sprays into both nostrils daily as needed for allergies. 02/18/21  Yes [name], MD  losartan (COZAAR) 100 MG tablet Take 1 tablet (100 mg total) by mouth daily. 09/02/21  Yes Lelon Perla, MD  ondansetron (ZOFRAN-ODT) 4 MG disintegrating tablet Take 1 tablet (4 mg total) by mouth  every 8 (eight) hours as needed for nausea or vomiting. 01/10/22  Yes Izora Gala, MD  pantoprazole (PROTONIX) 40 MG tablet TAKE 1 TABLET BY MOUTH TWICE A DAY BEFORE A MEAL Patient taking differently: Take 40 mg by mouth 2 (two) times daily. 08/27/21  Yes Nandigam, Venia Minks, MD  propranolol (INDERAL) 20 MG tablet Take 1 tablet by mouth three times daily as needed Patient taking differently: Take 20 mg by mouth daily as needed (If HR >120). 01/14/21  Yes Minus Breeding, MD  HYDROcodone-acetaminophen (HYCET) 7.5-325 mg/15 ml solution Take 15 mLs by mouth every 6 (six) hours as needed for moderate pain. Patient not taking: Reported on 03/26/2022 01/10/22   Izora Gala, MD  venlafaxine XR (EFFEXOR XR) 75 MG  24 hr capsule Take 1 capsule (75 mg total) by mouth daily with breakfast. Patient not taking: Reported on 03/26/2022 12/26/21   Biagio Borg, MD    Current Facility-Administered Medications  Medication Dose Route Frequency Provider Last Rate Last Admin   0.9 % irrigation (POUR BTL)    PRN Kinsinger, Arta Bruce, MD   1,000 mL at 04/10/22 1259   [MAR Hold] acetaminophen (TYLENOL) tablet 650 mg  650 mg Oral Q6H PRN Reubin Milan, MD       Or   [facility] Hold] acetaminophen (TYLENOL) suppository 650 mg  650 mg Rectal Q6H PRN Reubin Milan, MD       bupivacaine-EPINEPHrine (MARCAINE W/ EPI) 0.25% -1:200000 (with pres) injection    PRN Kinsinger, Arta Bruce, MD   30 mL at 04/10/22 1259   [MAR Hold] ciprofloxacin (CIPRO) IVPB 400 mg  400 mg Intravenous Q12H Reubin Milan, MD 200 mL/hr at 04/10/22 0543 400 mg at 04/10/22 0543   [MAR Hold] famotidine (PEPCID) IVPB 20 mg premix  20 mg Intravenous Q12H Reubin Milan, MD 100 mL/hr at 04/10/22 0957 20 mg at 04/10/22 0957   [MAR Hold] HYDROmorphone (DILAUDID) injection 1 mg  1 mg Intravenous Q4H PRN Reubin Milan, MD       iohexol (OMNIPAQUE) 300 MG/ML solution    PRN Kinsinger, Arta Bruce, MD   10 mL at 04/10/22 1318   lactated ringers infusion   Intravenous Continuous Donne Hazel, MD 75 mL/hr at 04/10/22 0816 New Bag at 04/10/22 1336   lactated ringers infusion   Intravenous Continuous Woodrum, Chelsey L, MD 10 mL/hr at 04/10/22 1031 New Bag at 04/10/22 1031   lactated ringers irrigation solution    PRN Kinsinger, Arta Bruce, MD   1,000 mL at 04/10/22 1259   [MAR Hold] metroNIDAZOLE (FLAGYL) IVPB 500 mg  500 mg Intravenous Q12H Reubin Milan, MD 100 mL/hr at 04/10/22 1154 500 mg at 04/10/22 1154   [MAR Hold] Oral care mouth rinse  15 mL Mouth Rinse PRN Reubin Milan, MD       [facility] Hold] prochlorperazine (COMPAZINE) injection 10 mg  10 mg Intravenous Q6H PRN Reubin Milan, MD   10 mg at 04/09/22 1248    scopolamine (TRANSDERM-SCOP) 1 MG/3DAYS 1.5 mg  1 patch Transdermal Q72H Annye Asa, MD   1.5 mg at 04/10/22 1030   Facility-Administered Medications Ordered in Other Encounters  Medication Dose Route Frequency Provider Last Rate Last Admin   dexamethasone (DECADRON) injection   Intravenous Anesthesia Intra-op Cynda Familia, CRNA   5 mg at 04/10/22 1249   fentaNYL (SUBLIMAZE) injection   Intravenous Anesthesia Intra-op Cynda Familia, CRNA   50 mcg at 04/10/22 1346  fentaNYL citrate (PF) (SUBLIMAZE) injection   Intravenous Anesthesia Intra-op Cynda Familia, CRNA   50 mcg at 04/10/22 1341   lidocaine 2% (20 mg/mL) 5 mL syringe   Intravenous Anesthesia Intra-op Cynda Familia, CRNA   60 mg at 04/10/22 1339   midazolam (VERSED) 5 MG/5ML injection   Intravenous Anesthesia Intra-op Maxwell Caul, CRNA   1 mg at 04/10/22 1349   ondansetron (ZOFRAN) injection   Intravenous Anesthesia Intra-op Cynda Familia, CRNA   4 mg at 04/10/22 1307   propofol (DIPRIVAN) 10 mg/mL bolus/IV push   Intravenous Anesthesia Intra-op Cynda Familia, CRNA   130 mg at 04/10/22 1246   rocuronium bromide 10 mg/mL (PF) syringe   Intravenous Anesthesia Intra-op Cynda Familia, CRNA   50 mg at 04/10/22 1252   succinylcholine (ANECTINE) syringe   Intravenous Anesthesia Intra-op Cynda Familia, CRNA   120 mg at 04/10/22 1246   sugammadex sodium (BRIDION) injection   Intravenous Anesthesia Intra-op Cynda Familia, CRNA   500 mg at 04/10/22 1335    Allergies as of 04/09/2022 - Review Complete 04/09/2022  Allergen Reaction Noted   Cefaclor Rash 09/27/2008   Codeine Nausea And Vomiting 09/27/2008   Hydrocodone Nausea And Vomiting    Levaquin [levofloxacin] Nausea Only 09/27/2008   Nexium [esomeprazole magnesium] Other (See Comments) 11/23/2012   Penicillins Rash 09/27/2008   Sulfa antibiotics Rash 03/20/2017    Family History  Problem Relation Age of Onset   Breast cancer Mother         51   Asthma Mother    Hypertension Mother    Prostate cancer Father    Aortic stenosis Father    Hypertension Father    Colon polyps Father    Heart disease Father    Diabetes Father    Breast cancer Maternal Aunt    Breast cancer Maternal Aunt    Esophageal cancer Other    Stomach cancer Other    Colon cancer Other    Breast cancer Niece        47s   Rectal cancer Neg Hx     Social History   Socioeconomic History   Marital status: Married    Spouse name: Not on file   Number of children: 2   Years of education: Not on file   Highest education level: Not on file  Occupational History   Occupation: Retired from [employer]: [place]: from [place], currently works [employer] in [place] Use   Smoking status: Never   Smokeless tobacco: Never   Tobacco comments:    Married lives with spouse + 2 daughters  Vaping Use   Vaping Use: Never used  Substance and Sexual Activity   Alcohol use: No   Drug use: No   Sexual activity: Not on file  Other Topics Concern   Not on file  Social History Narrative   Occupation: Management, social services. Married, lives with husband. Has 2 daughter 60 - 14   Social Determinants of Radio broadcast assistant Strain: Not on file  Food Insecurity: No Food Insecurity (04/09/2022)   Hunger Vital Sign    Worried About Running Out of Food in the Last Year: Never true    Ran Out of Food in the Last Year: Never true  Transportation Needs: No Transportation Needs (04/09/2022)   PRAPARE - Hydrologist (Medical):  No    Lack of Transportation (Non-Medical): No  Physical Activity: Not on file  Stress: Not on file  Social Connections: Not on file  Intimate Partner Violence: Not At Risk (04/09/2022)   Humiliation, Afraid, Rape, and Kick questionnaire    Fear of Current or Ex-Partner: No    Emotionally Abused: No    Physically Abused: No    Sexually Abused: No     Review of Systems: Gen: Denies fever, sweats or chills. No weight loss.  CV: Denies chest pain, palpitations or edema. Resp: + SOB.  No cough. GI: See HPI. GU : Denies urinary burning, blood in urine, increased urinary frequency or incontinence. MS: Denies joint pain, muscles aches or weakness. Derm: Denies rash, itchiness, skin lesions or unhealing ulcers. Psych: Denies depression, anxiety memory loss. Heme: Denies easy bruising, bleeding. Neuro:  Denies headaches, dizziness or paresthesias. Endo:  Denies any problems with DM, thyroid or adrenal function.  Physical Exam: Vital signs in last 24 hours: Temp:  [97.4 F (36.3 C)-98.7 F (37.1 C)] 98.7 F (37.1 C) (11/30 0850) Pulse Rate:  [45-52] 51 (11/30 0850) Resp:  [12-20] 18 (11/30 0850) BP: (114-137)/(46-60) 135/60 (11/30 0850) SpO2:  [96 %-100 %] 97 % (11/30 0850) Weight:  [98.1 kg] 98.1 kg (11/30 1027) Last BM Date : 04/09/22 General: Somewhat lethargic 66 year old female in PACU status post cholecystectomy in no acute distress. Head:  Normocephalic and atraumatic. Eyes:  No scleral icterus. Conjunctiva pink. Ears:  Normal auditory acuity. Nose:  No deformity, discharge or lesions. Mouth:  Dentition intact. No ulcers or lesions.  Neck:  Supple. No lymphadenopathy or thyromegaly.  Lungs: Breath sounds clear throughout. Heart: Rate and rhythm, no murmurs. Abdomen: Soft, nondistended.  Scopic incisions intact.  No active bowel sounds at this time. Rectal: Deferred. Musculoskeletal:  Symmetrical without gross deformities.  Pulses:  Normal pulses noted. Extremities:  Without clubbing or edema. Neurologic:  Alert and  oriented x 4. No focal deficits.  Skin:  Intact without significant lesions or rashes. Psych:  Alert and cooperative. Normal mood and affect.  Intake/Output from previous day: 11/29 0701 - 11/30 0700 In: 184.1 [IV Piggyback:184.1] Out: -  Intake/Output this shift: Total I/O In: 1000  [I.V.:1000] Out: 15 [Blood:15]  Lab Results: Recent Labs    04/09/22 0136 04/10/22 0735  WBC 8.2 4.8  HGB 13.5 13.6  HCT 41.6 42.5  PLT 231 229   BMET Recent Labs    04/09/22 0136 04/10/22 0735  NA 139 138  K 3.7 3.7  CL 108 107  CO2 26 27  GLUCOSE 152* 145*  BUN 17 11  CREATININE 0.75 0.87  CALCIUM 9.3 9.0   LFT Recent Labs    04/10/22 0735  PROT 6.8  ALBUMIN 3.4*  AST 224*  ALT 345*  ALKPHOS 134*  BILITOT 1.8*   PT/INR No results for input(s): "LABPROT", "INR" in the last 72 hours. Hepatitis Panel No results for input(s): "HEPBSAG", "HCVAB", "HEPAIGM", "HEPBIGM" in the last 72 hours.    Studies/Results: ECHOCARDIOGRAM COMPLETE  Result Date: 04/09/2022    ECHOCARDIOGRAM REPORT   Patient Name:   Shannon Navarro Date of Exam: 04/09/2022 Medical Rec #:  [id number]         Height:       66.0 in Accession #:    [id number]        Weight:       226.8 lb Date of Birth:  Nov 05, 1955         BSA:  2.110 m Patient Age:    64 years          BP:           122/39 mmHg Patient Gender: F                 HR:           46 bpm. Exam Location:  Inpatient Procedure: 2D Echo, Color Doppler and Cardiac Doppler Indications:    Pre-op clearance  History:        Patient has prior history of Echocardiogram examinations, most                 recent 09/08/2019. Arrythmias:Atrial Fibrillation and                 Bradycardia; Risk Factors:Sleep Apnea and Hypertension.  Sonographer:    Eartha Inch Referring Phys: [id number] HAO MENG  Sonographer Comments: Suboptimal parasternal window. Image acquisition challenging due to patient body habitus. IMPRESSIONS  1. Left ventricular ejection fraction, by estimation, is 60 to 65%. The left ventricle has normal function. The left ventricle has no regional wall motion abnormalities. Left ventricular diastolic parameters are indeterminate.  2. Right ventricular systolic function is normal. The right ventricular size is normal. There is normal  pulmonary artery systolic pressure.  3. No evidence of mitral valve regurgitation.  4. Aortic valve regurgitation is not visualized.  5. The inferior vena cava is normal in size with greater than 50% respiratory variability, suggesting right atrial pressure of 3 mmHg. Comparison(s): No significant change from prior study. FINDINGS  Left Ventricle: Left ventricular ejection fraction, by estimation, is 60 to 65%. The left ventricle has normal function. The left ventricle has no regional wall motion abnormalities. The left ventricular internal cavity size was normal in size. Left ventricular diastolic parameters are indeterminate. Right Ventricle: The right ventricular size is normal. Right ventricular systolic function is normal. There is normal pulmonary artery systolic pressure. The tricuspid regurgitant velocity is 2.38 m/s, and with an assumed right atrial pressure of 3 mmHg,  the estimated right ventricular systolic pressure is 73.4 mmHg. Left Atrium: Left atrial size was normal in size. Right Atrium: Right atrial size was normal in size. Pericardium: There is no evidence of pericardial effusion. Mitral Valve: No evidence of mitral valve regurgitation. MV peak gradient, 4.2 mmHg. The mean mitral valve gradient is 1.0 mmHg. Tricuspid Valve: Tricuspid valve regurgitation is trivial. Aortic Valve: Aortic valve regurgitation is not visualized. Aortic valve mean gradient measures 9.0 mmHg. Aortic valve peak gradient measures 15.2 mmHg. Aortic valve area, by VTI measures 2.59 cm. Pulmonic Valve: Pulmonic valve regurgitation is trivial. Aorta: The aortic root and ascending aorta are structurally normal, with no evidence of dilitation. Venous: The inferior vena cava is normal in size with greater than 50% respiratory variability, suggesting right atrial pressure of 3 mmHg. IAS/Shunts: No atrial level shunt detected by color flow Doppler.  LEFT VENTRICLE PLAX 2D LVIDd:         4.90 cm   Diastology LVIDs:         3.20 cm    LV e' medial:    5.44 cm/s LV PW:         0.90 cm   LV E/e' medial:  18.4 LV IVS:        0.60 cm   LV e' lateral:   8.81 cm/s LVOT diam:     2.00 cm   LV E/e' lateral: 11.3 LV SV:  129 LV SV Index:   61 LVOT Area:     3.14 cm  RIGHT VENTRICLE             IVC RV S prime:     12.70 cm/s  IVC diam: 2.20 cm TAPSE (M-mode): 2.7 cm LEFT ATRIUM             Index        RIGHT ATRIUM           Index LA diam:        3.30 cm 1.56 cm/m   RA Area:     16.10 cm LA Vol (A2C):   51.1 ml 24.22 ml/m  RA Volume:   36.80 ml  17.44 ml/m LA Vol (A4C):   44.8 ml 21.24 ml/m LA Biplane Vol: 47.7 ml 22.61 ml/m  AORTIC VALVE                     PULMONIC VALVE AV Area (Vmax):    2.59 cm      PR End Diast Vel: 1.84 msec AV Area (Vmean):   2.46 cm AV Area (VTI):     2.59 cm AV Vmax:           195.00 cm/s AV Vmean:          139.000 cm/s AV VTI:            0.500 m AV Peak Grad:      15.2 mmHg AV Mean Grad:      9.0 mmHg LVOT Vmax:         161.00 cm/s LVOT Vmean:        109.000 cm/s LVOT VTI:          0.412 m LVOT/AV VTI ratio: 0.82  AORTA Ao Root diam: 2.60 cm Ao Asc diam:  3.40 cm MITRAL VALVE               TRICUSPID VALVE MV Area (PHT): 3.72 cm    TR Peak grad:   22.7 mmHg MV Area VTI:   2.76 cm    TR Mean grad:   16.0 mmHg MV Peak grad:  4.2 mmHg    TR Vmax:        238.00 cm/s MV Mean grad:  1.0 mmHg    TR Vmean:       194.0 cm/s MV Vmax:       1.02 m/s MV Vmean:      48.6 cm/s   SHUNTS MV Decel Time: 204 msec    Systemic VTI:  0.41 m MV E velocity: 99.90 cm/s  Systemic Diam: 2.00 cm MV A velocity: 81.00 cm/s MV E/A ratio:  1.23 Landscape architect signed by Phineas Inches Signature Date/Time: 04/09/2022/2:05:58 PM    Final    MR ABDOMEN MRCP W WO CONTAST  Result Date: 04/09/2022 CLINICAL DATA:  Upper abdominal pain with nausea and vomiting. Cholelithiasis, gallbladder distension and pericholecystic fluid on ultrasound. EXAM: MRI ABDOMEN WITHOUT AND WITH CONTRAST (INCLUDING MRCP) TECHNIQUE: Multiplanar multisequence  MR imaging of the abdomen was performed both before and after the administration of intravenous contrast. Heavily T2-weighted images of the biliary and pancreatic ducts were obtained, and three-dimensional MRCP images were rendered by post processing. CONTRAST:  60m GADAVIST GADOBUTROL 1 MMOL/ML IV SOLN COMPARISON:  Abdominal ultrasound 04/09/2022 and 03/25/2022. Abdominal CT 10/07/2012. FINDINGS: Lower chest: There is a moderate size hiatal hernia. The visualized lower chest otherwise appears unremarkable. Hepatobiliary: No evidence of steatosis, cirrhosis or focal  liver lesion. As seen on ultrasound, there are several gallstones. There is mild pericholecystic fluid. There is mild extrahepatic biliary dilatation with the common hepatic duct measuring up to 8 mm in diameter. The duct tapers distally. No evidence of choledocholithiasis. No intrahepatic biliary dilatation. Pancreas: Unremarkable. No pancreatic ductal dilatation or surrounding inflammatory changes. Spleen: Normal in size without focal abnormality. Adrenals/Urinary Tract: Both adrenal glands appear normal. No evidence of hydronephrosis or suspicious renal lesion. Bladder not imaged. Stomach/Bowel: Moderate stool throughout the colon. No evidence of bowel distension or inflammation. Vascular/Lymphatic: There are no enlarged abdominal lymph nodes. No significant vascular findings. Other: No evidence of abdominal wall hernia or ascites. Musculoskeletal: No acute or significant osseous findings. IMPRESSION: 1. Cholelithiasis with mild pericholecystic fluid, suspicious for acute cholecystitis. A sonographic Percell Miller sign was not reported on earlier ultrasound. Correlate clinically. 2. Mild extrahepatic biliary dilatation without evidence of choledocholithiasis. 3. No other significant abdominal findings. 4. Moderate size hiatal hernia. Electronically Signed   By: Richardean Sale M.D.   On: 04/09/2022 09:52   MR 3D Recon At Scanner  Result Date:  04/09/2022 CLINICAL DATA:  Upper abdominal pain with nausea and vomiting. Cholelithiasis, gallbladder distension and pericholecystic fluid on ultrasound. EXAM: MRI ABDOMEN WITHOUT AND WITH CONTRAST (INCLUDING MRCP) TECHNIQUE: Multiplanar multisequence MR imaging of the abdomen was performed both before and after the administration of intravenous contrast. Heavily T2-weighted images of the biliary and pancreatic ducts were obtained, and three-dimensional MRCP images were rendered by post processing. CONTRAST:  69m GADAVIST GADOBUTROL 1 MMOL/ML IV SOLN COMPARISON:  Abdominal ultrasound 04/09/2022 and 03/25/2022. Abdominal CT 10/07/2012. FINDINGS: Lower chest: There is a moderate size hiatal hernia. The visualized lower chest otherwise appears unremarkable. Hepatobiliary: No evidence of steatosis, cirrhosis or focal liver lesion. As seen on ultrasound, there are several gallstones. There is mild pericholecystic fluid. There is mild extrahepatic biliary dilatation with the common hepatic duct measuring up to 8 mm in diameter. The duct tapers distally. No evidence of choledocholithiasis. No intrahepatic biliary dilatation. Pancreas: Unremarkable. No pancreatic ductal dilatation or surrounding inflammatory changes. Spleen: Normal in size without focal abnormality. Adrenals/Urinary Tract: Both adrenal glands appear normal. No evidence of hydronephrosis or suspicious renal lesion. Bladder not imaged. Stomach/Bowel: Moderate stool throughout the colon. No evidence of bowel distension or inflammation. Vascular/Lymphatic: There are no enlarged abdominal lymph nodes. No significant vascular findings. Other: No evidence of abdominal wall hernia or ascites. Musculoskeletal: No acute or significant osseous findings. IMPRESSION: 1. Cholelithiasis with mild pericholecystic fluid, suspicious for acute cholecystitis. A sonographic MPercell Millersign was not reported on earlier ultrasound. Correlate clinically. 2. Mild extrahepatic biliary  dilatation without evidence of choledocholithiasis. 3. No other significant abdominal findings. 4. Moderate size hiatal hernia. Electronically Signed   By: WRichardean SaleM.D.   On: 04/09/2022 09:52   [place] LIMITED RUQ (LIVER/GB)  Result Date: 04/09/2022 CLINICAL DATA:  66year old female with history of right upper quadrant abdominal pain for the past 5 months. EXAM: ULTRASOUND ABDOMEN LIMITED RIGHT UPPER QUADRANT COMPARISON:  Abdominal ultrasound 03/25/2022. FINDINGS: Gallbladder: Echogenic foci with posterior acoustic shadowing noted lying dependently in the gallbladder, measuring up to 1.9 cm in diameter. Gallbladder is moderately distended. Gallbladder wall thickness is upper limits of normal at 3 mm. Trace amount of pericholecystic fluid. However, per report from the sonographer, there was no sonographic Murphy's sign on examination. Common bile duct: Diameter: 8.5 mm. Liver: No focal lesion identified. Within normal limits in parenchymal echogenicity. Portal vein is patent on color Doppler imaging  with normal direction of blood flow towards the liver. Other: None. IMPRESSION: 1. Cholelithiasis with trace volume of pericholecystic fluid, but no frank sonographic Murphy's sign on examination. Findings are equivocal, but not strongly suggestive of acute cholecystitis given the lack of sonographic Murphy's sign. 2. However, there is dilatation of the common bile duct (8.5 mm). If there is clinical concern for biliary tract obstruction from choledocholithiasis, further evaluation with abdominal MRI with and without IV gadolinium with MRCP should be considered at this time. Electronically Signed   By: Vinnie Langton M.D.   On: 04/09/2022 05:53    IMPRESSION/PLAN:  33) 66 year old female who was recently diagnosed with symptomatic gallstones with plans for an outpatient cholecystectomy who was admitted to the hospital 04/09/2022 with N/V and worsening epigastric pain which radiated to her back.   Total bili 2.3.  Alk phos 2.3.  AST 200.  ALT 137.  Lipase 41. RUQ sonogram identified cholelithiasis with trace pericholecystic fluid suggestive of acute cholecystitis with dilatation of the CBD measuring 8.5 mm. Abdominal MRI/MRCP showed cholelithiasis, mild extrahepatic biliary dilatation without evidence of choledocholithiasis. She underwent a laparoscopic cholecystectomy today and an intraoperative IOC was positive for choledocholithiasis.  -NPO -Continue IV fluids  -Continue Cipro 422m IV bid -ERCP with Dr. GLyndel Safe12/05/2021, benefits and risks discussed including risk with sedation, risk of bleeding, perforation, pancreatitis and infection  -Pain management per the hospitalist -Ondansetron 4 mg IV every 6 hours as needed -CBC, hepatic panel and BMP in a.m. -Await further recommendations per Dr. GLyndel Safe 2) History of GERD and esophageal stenosis -Continue Famotidine 252mIV bid  3) History of atrial fibrillation, not on anticoagulation   4) Recurrent symptomatic bradycardia. Cardiac clearance received prior to cholecystectomy.          CoNoralyn Pick11/30/2023, 1:51 PM   Attending physician's note   I have taken history, reviewed the chart and examined the patient. I performed a substantive portion of this encounter, including complete performance of at least one of the key components, in conjunction with the APP. I agree with the Advanced Practitioner's note, impression and recommendations.   Acute cholecystitis s/p lap chole today with IOC showing suspected choledocholithiasis.  IOC reviewed.  MRCP reviewed. Abn LFTs. No ascending cholangitis or pancreatitis  Plan: -ERCP in AM.   -Recheck labs in AM.    I have discussed risks and benefits with patient and patient's daughter in detail including small but definite risks of pancreatitis, bleeding, perforation.  Benefits were also discussed.  She wishes to proceed.   RaCarmell AustriaMD LeVelora HecklerGI [phone]

## 2022-04-10 NOTE — Hospital Course (Signed)
66 y.o. female with medical history significant of allergy rhinitis, anxiety, paroxysmal atrial fibrillation, endometrial polyp, fatty liver disease, GERD, herniated cervical disc, history of cervical spine fusion, esophageal stricture, hypertension, impaired fasting glucose, migraine headaches, class II obesity, OSA on CPAP, pseudotumor cerebri, vitamin D deficiency was coming to the emergency department due to on and off epigastric/RUQ abdominal pain for the past few weeks.  She was seen in the emergency department on 03/25/2022 due to syncopal episode in the setting of bradycardia in the 30s and hypotension while at the orthopedic surgeon's office where she was treated with 500 mL of normal saline and a milligram of atropine by EMS.  Symptoms have resolved by the time that she arrived to the emergency department.  She was followed by Dr. [name] next day who performed a 3-day cardiac monitor on her.  She returned early morning today after having the worst episode of pain associated with multiple episodes of emesis.  And she has been bradycardic.  No fever, chills or night sweats. No sore throat, rhinorrhea, dyspnea, wheezing or hemoptysis.  No chest pain, palpitations, diaphoresis, PND, orthopnea or pitting edema of the lower extremities.  No diarrhea, constipation, melena or hematochezia.  No flank pain, dysuria, frequency or hematuria, but stated her urine has been darker.  No polyuria, polydipsia, polyphagia or blurred vision.

## 2022-04-10 NOTE — Discharge Instructions (Signed)
[facility]  Please arrive at least 30 min before your appointment to complete your check in paperwork.  If you are unable to arrive 30 min prior to your appointment time we may have to cancel or reschedule you. LAPAROSCOPIC SURGERY: POST OP INSTRUCTIONS Always review your discharge instruction sheet given to you by the facility where your surgery was performed. IF YOU HAVE DISABILITY OR FAMILY LEAVE FORMS, YOU MUST BRING THEM TO THE OFFICE FOR PROCESSING.   DO NOT GIVE THEM TO YOUR DOCTOR.  PAIN CONTROL  First take acetaminophen (Tylenol) AND/or ibuprofen (Advil) to control your pain after surgery.  Follow directions on package.  Taking acetaminophen (Tylenol) and/or ibuprofen (Advil) regularly after surgery will help to control your pain and lower the amount of prescription pain medication you may need.  You should not take more than 4,000 mg (4 grams) of acetaminophen (Tylenol) in 24 hours.  You should not take ibuprofen (Advil), aleve, motrin, naprosyn or other NSAIDS if you have a history of stomach ulcers or chronic kidney disease.  A prescription for pain medication may be given to you upon discharge.  Take your pain medication as prescribed, if you still have uncontrolled pain after taking acetaminophen (Tylenol) or ibuprofen (Advil). Use ice packs to help control pain. If you need a refill on your pain medication, please contact your pharmacy.  They will contact our office to request authorization. Prescriptions will not be filled after 5pm or on week-ends.  HOME MEDICATIONS Take your usually prescribed medications unless otherwise directed.  DIET You should follow a light diet the first few days after arrival home.  Be sure to include lots of fluids daily. Avoid fatty, fried foods.   CONSTIPATION It is common to experience some constipation after surgery and if you are taking pain medication.  Increasing fluid intake and taking a stool softener (such as Colace)  will usually help or prevent this problem from occurring.  A mild laxative (Milk of Magnesia or Miralax) should be taken according to package instructions if there are no bowel movements after 48 hours.  WOUND/INCISION CARE Most patients will experience some swelling and bruising in the area of the incisions.  Ice packs will help.  Swelling and bruising can take several days to resolve.  Unless discharge instructions indicate otherwise, follow guidelines below  STERI-STRIPS - you may remove your outer bandages 48 hours after surgery, and you may shower at that time.  You have steri-strips (small skin tapes) in place directly over the incision.  These strips should be left on the skin for 7-10 days.   DERMABOND/SKIN GLUE - you may shower in 24 hours.  The glue will flake off over the next 2-3 weeks. Any sutures or staples will be removed at the office during your follow-up visit.  ACTIVITIES You may resume regular (light) daily activities beginning the next day--such as daily self-care, walking, climbing stairs--gradually increasing activities as tolerated.  You may have sexual intercourse when it is comfortable.  Refrain from any heavy lifting or straining until approved by your doctor. You may drive when you are no longer taking prescription pain medication, you can comfortably wear a seatbelt, and you can safely maneuver your car and apply brakes.  FOLLOW-UP You should see your doctor in the office for a follow-up appointment approximately 2-3 weeks after your surgery.  You should have been given your post-op/follow-up appointment when your surgery was scheduled.  If you did not receive a post-op/follow-up appointment, make sure   that you call for this appointment within a day or two after you arrive home to insure a convenient appointment time.   WHEN TO CALL YOUR DOCTOR: Fever over 101.0 Inability to urinate Continued bleeding from incision. Increased pain, redness, or drainage from the  incision. Increasing abdominal pain  The clinic staff is available to answer your questions during regular business hours.  Please don't hesitate to call and ask to speak to one of the nurses for clinical concerns.  If you have a medical emergency, go to the nearest emergency room or call 911.  A surgeon from [facility] is always on call at the hospital. [address], [address], [place], [place]  [address] ? [address] [address], [place], [place]   [address] [phone] ? [phone] ? FAX [phone]  

## 2022-04-10 NOTE — H&P (View-Only) (Signed)
Referring Provider: Dr. Gurney Maxin Primary Care Physician:  [name], MD Primary Gastroenterologist:  [name]   Reason for Consultation: S/P cholecystectomy, positive IOC  HPI: Shannon Navarro is a 66 y.o. female with a past medical history of anxiety, hypertension, paroxysmal atrial fibrillation (not on anticoagulation), symptomatic bradycardia, OSA on CPAP, pseudotumor cerebri, hepatic steatosis, GERD, benign appearing esophageal stenosis and diverticulosis.  She was last seen in GI clinic 02/17/2022 by Vicie Mutters, PA-C due to having upper abdominal pain which radiated around to her back.  Labs showed elevated ALT level of 109.  RUQ sonogram 02/21/2022 showed cholelithiasis without biliary dilatation.  A CCK HIDA scan 03/07/2022 showed a reduced gallbladder ejection fraction of 29%.  She was referred to general surgeon Dr. Armandina Gemma and an outpatient cholecystectomy was planned after she completed a cardiac evaluation due to symptomatic bradycardia.  She presented to [facility] ED 04/09/2022 due to having worsening upper abdominal pain which radiated into her back with associated nausea and vomiting.  No hematemesis. Labs in the ED showed a WBC count 8.2.  Hemoglobin 13.5.  Hematocrit 41.6.  Platelet 231.  BUN 17.  Creatinine 0.75.  Total bili 2.3.  Alk phos 2.3.  AST 200.  ALT 137.  Lipase 41.  RUQ sonogram identified cholelithiasis with trace pericholecystic fluid suggestive of acute cholecystitis with dilatation of the CBD measuring 8.5 mm.  Abdominal MRI/MRCP showed cholelithiasis, mild extrahepatic biliary dilatation without evidence of choledocholithiasis.  A moderate size hiatal hernia was also noted.  She had recent symptomatic bradycardia with syncope therefore she was evaluated by cardiologist Dr. Margaretann Loveless 04/09/2022 and cardiac clearance was provided prior to proceeding with a cholecystectomy. She underwent a laparoscopic cholecystectomy by Dr.  Kieth Brightly earlier today IOC was positive.  A GI consult was requested for ERCP evaluation.   She has a history of GERD, dysphagia with a benign esophageal stenosis.  She takes Protonix 40 mg daily as needed and not on a consistent basis.  She intermittently has trouble swallowing foods such as chicken or bread.  Intermittent heartburn.  Her most recent EGD was 12/03/2020 which showed a benign-appearing esophageal stenosis which was dilated, stomach and duodenum were normal.  He passes a normal formed brown bowel movement most days.  She has intermittent constipation.  No rectal bleeding or black stools.  Her most recent colonoscopy was 06/25/2020 which showed diverticulosis and 1 polyp was removed from the ascending colon but biopsy showed polypoid colonic mucosa and not a true colon polyp.  Labs 04/10/2022: BBC 4.8.  Total bili 1.8.  Alk phos 134.  AST 224.  ALT 345.  MOST RECENT GI PROCEDURES:  EGD 12/03/2020: - Benign-appearing esophageal stenosis. Dilated. - Normal stomach. - Normal examined duodenum. - No specimens collected.  Colonoscopy 06/25/2020: One 5 mm polyp in the ascending colon, removed with a cold snare. Resected and retrieved. - Diverticulosis in the sigmoid colon and in the descending colon. - Non-bleeding internal hemorrhoids. POLYPOID COLONIC MUCOSA WITH ENLARGED LYMPHOID AGGREGATE. - NO ADENOMATOUS CHANGE OR CARCINOMA  EGD 06/25/2020: - Benign-appearing esophageal stenosis. Dilated. - Medium-sized hiatal hernia. - No gross lesions in the stomach. - Normal first portion of the duodenum and second portion of the duodenum. - No specimens collected.   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   History of esophageal stricture    mainly due  to gerd   History of exercise stress test 06/13/2015   PER DR HOCHREIN NOTE -- NO EVIDENCE ISCHEMIA   HYPERTENSION    Impaired fasting glucose    Migraine    hx of migraines none recent   OSA on CPAP followed by dr Halford Chessman   per study 02-09-2012  severe osa w/ AHI 33.8   Paroxysmal atrial fibrillation ([place]) cardioloigst-  dr hochrein   dx 02-21-2016 at ED   Pneumonia    with COVID   PONV (postoperative nausea and vomiting)    after surgery in 10/2021- had nausea after surgery related to preop drink   Pre-diabetes    Pseudotumor cerebri 2007   S/P cervical spinal fusion 10/24/2021   Sleep apnea    Vitamin D deficiency 07/2013    Past Surgical History:  Procedure Laterality Date   ABDOMINAL HYSTERECTOMY     ANTERIOR CERVICAL DECOMPRESSION/DISCECTOMY FUSION 4 LEVELS N/A 10/24/2021   Procedure: ANTERIOR CERVICAL DECOMPRESSION FUSION CERVICAL FOUR THROUGH CERVICAL FIVE, CERVICAL FIVE THROUGH CERVICAL SIX, CERVICAL SIX THROUGH CERVICAL SEVEN WITH INSTRUMENTATION AND ALLOGRAFT;  Surgeon: Phylliss Bob, MD;  Location: [facility];  Service: Orthopedics;  Laterality: N/A;   BREAST BIOPSY Right 08/12/2019    FIBROADENOMA    COLONOSCOPY     colonscopy  2007   normal   DILATATION & CURETTAGE/HYSTEROSCOPY WITH MYOSURE N/A 03/26/2017   Procedure: DILATATION & CURETTAGE/HYSTEROSCOPY WITH MYOSURE;  Surgeon: Paula Compton, MD;  Location: [facility];  Service: Gynecology;  Laterality: N/A;   DILATION AND CURETTAGE OF UTERUS  2010   FRACTURE SURGERY     LAPAROSCOPIC HYSTERECTOMY Bilateral 06/18/2017   Procedure: HYSTERECTOMY TOTAL LAPAROSCOPIC;  Surgeon: Paula Compton, MD;  Location: [facility];  Service: Gynecology;  Laterality: Bilateral;  OUT PT IN BED, needs extra time MD request 2.5hrs in OR   right knee arthroscopy  03/2013   TONSILLECTOMY Bilateral 01/10/2022   Procedure: TONSILLECTOMY;  Surgeon: Izora Gala, MD;  Location: [facility];  Service: ENT;  Laterality:  Bilateral;   torn meniscus surgery on right knee      TRANSTHORACIC ECHOCARDIOGRAM  03/11/2016   ef 60-65%/  mild MR/ trivial PR and TR   TUBAL LIGATION     UPPER GASTROINTESTINAL ENDOSCOPY  11/2020    Prior to Admission medications   Medication Sig Start Date End Date Taking? Authorizing Provider  [place] Oil-Levomenthol (FDGARD) 25-20.75 MG CAPS Use as directed Patient taking differently: Take 1 capsule by mouth daily. 02/17/22  Yes Vicie Mutters R, PA-C  cetirizine (ZYRTEC) 10 MG tablet Take 10 mg by mouth daily.   Yes [provider]  Cholecalciferol (VITAMIN D) 125 MCG (5000 UT) CAPS Take 5,000 Units by mouth daily.   Yes [provider]  doxycycline (VIBRA-TABS) 100 MG tablet Take 100 mg by mouth 3 (three) times daily. 03/17/22  Yes [provider]  fluticasone (FLONASE) 50 MCG/ACT nasal spray Place 2 sprays into both nostrils daily. Patient taking differently: Place 2 sprays into both nostrils daily as needed for allergies. 02/18/21  Yes [name], MD  losartan (COZAAR) 100 MG tablet Take 1 tablet (100 mg total) by mouth daily. 09/02/21  Yes Lelon Perla, MD  ondansetron (ZOFRAN-ODT) 4 MG disintegrating tablet Take 1 tablet (4 mg total) by mouth  every 8 (eight) hours as needed for nausea or vomiting. 01/10/22  Yes Izora Gala, MD  pantoprazole (PROTONIX) 40 MG tablet TAKE 1 TABLET BY MOUTH TWICE A DAY BEFORE A MEAL Patient taking differently: Take 40 mg by mouth 2 (two) times daily. 08/27/21  Yes Nandigam, Venia Minks, MD  propranolol (INDERAL) 20 MG tablet Take 1 tablet by mouth three times daily as needed Patient taking differently: Take 20 mg by mouth daily as needed (If HR >120). 01/14/21  Yes Minus Breeding, MD  HYDROcodone-acetaminophen (HYCET) 7.5-325 mg/15 ml solution Take 15 mLs by mouth every 6 (six) hours as needed for moderate pain. Patient not taking: Reported on 03/26/2022 01/10/22   Izora Gala, MD  venlafaxine XR (EFFEXOR XR) 75 MG  24 hr capsule Take 1 capsule (75 mg total) by mouth daily with breakfast. Patient not taking: Reported on 03/26/2022 12/26/21   Biagio Borg, MD    Current Facility-Administered Medications  Medication Dose Route Frequency Provider Last Rate Last Admin   0.9 % irrigation (POUR BTL)    PRN Kinsinger, Arta Bruce, MD   1,000 mL at 04/10/22 1259   [MAR Hold] acetaminophen (TYLENOL) tablet 650 mg  650 mg Oral Q6H PRN Reubin Milan, MD       Or   [facility] Hold] acetaminophen (TYLENOL) suppository 650 mg  650 mg Rectal Q6H PRN Reubin Milan, MD       bupivacaine-EPINEPHrine (MARCAINE W/ EPI) 0.25% -1:200000 (with pres) injection    PRN Kinsinger, Arta Bruce, MD   30 mL at 04/10/22 1259   [MAR Hold] ciprofloxacin (CIPRO) IVPB 400 mg  400 mg Intravenous Q12H Reubin Milan, MD 200 mL/hr at 04/10/22 0543 400 mg at 04/10/22 0543   [MAR Hold] famotidine (PEPCID) IVPB 20 mg premix  20 mg Intravenous Q12H Reubin Milan, MD 100 mL/hr at 04/10/22 0957 20 mg at 04/10/22 0957   [MAR Hold] HYDROmorphone (DILAUDID) injection 1 mg  1 mg Intravenous Q4H PRN Reubin Milan, MD       iohexol (OMNIPAQUE) 300 MG/ML solution    PRN Kinsinger, Arta Bruce, MD   10 mL at 04/10/22 1318   lactated ringers infusion   Intravenous Continuous Donne Hazel, MD 75 mL/hr at 04/10/22 0816 New Bag at 04/10/22 1336   lactated ringers infusion   Intravenous Continuous Woodrum, Chelsey L, MD 10 mL/hr at 04/10/22 1031 New Bag at 04/10/22 1031   lactated ringers irrigation solution    PRN Kinsinger, Arta Bruce, MD   1,000 mL at 04/10/22 1259   [MAR Hold] metroNIDAZOLE (FLAGYL) IVPB 500 mg  500 mg Intravenous Q12H Reubin Milan, MD 100 mL/hr at 04/10/22 1154 500 mg at 04/10/22 1154   [MAR Hold] Oral care mouth rinse  15 mL Mouth Rinse PRN Reubin Milan, MD       [facility] Hold] prochlorperazine (COMPAZINE) injection 10 mg  10 mg Intravenous Q6H PRN Reubin Milan, MD   10 mg at 04/09/22 1248    scopolamine (TRANSDERM-SCOP) 1 MG/3DAYS 1.5 mg  1 patch Transdermal Q72H Annye Asa, MD   1.5 mg at 04/10/22 1030   Facility-Administered Medications Ordered in Other Encounters  Medication Dose Route Frequency Provider Last Rate Last Admin   dexamethasone (DECADRON) injection   Intravenous Anesthesia Intra-op Cynda Familia, CRNA   5 mg at 04/10/22 1249   fentaNYL (SUBLIMAZE) injection   Intravenous Anesthesia Intra-op Cynda Familia, CRNA   50 mcg at 04/10/22 1346  fentaNYL citrate (PF) (SUBLIMAZE) injection   Intravenous Anesthesia Intra-op Cynda Familia, CRNA   50 mcg at 04/10/22 1341   lidocaine 2% (20 mg/mL) 5 mL syringe   Intravenous Anesthesia Intra-op Cynda Familia, CRNA   60 mg at 04/10/22 1339   midazolam (VERSED) 5 MG/5ML injection   Intravenous Anesthesia Intra-op Maxwell Caul, CRNA   1 mg at 04/10/22 1349   ondansetron (ZOFRAN) injection   Intravenous Anesthesia Intra-op Cynda Familia, CRNA   4 mg at 04/10/22 1307   propofol (DIPRIVAN) 10 mg/mL bolus/IV push   Intravenous Anesthesia Intra-op Cynda Familia, CRNA   130 mg at 04/10/22 1246   rocuronium bromide 10 mg/mL (PF) syringe   Intravenous Anesthesia Intra-op Cynda Familia, CRNA   50 mg at 04/10/22 1252   succinylcholine (ANECTINE) syringe   Intravenous Anesthesia Intra-op Cynda Familia, CRNA   120 mg at 04/10/22 1246   sugammadex sodium (BRIDION) injection   Intravenous Anesthesia Intra-op Cynda Familia, CRNA   500 mg at 04/10/22 1335    Allergies as of 04/09/2022 - Review Complete 04/09/2022  Allergen Reaction Noted   Cefaclor Rash 09/27/2008   Codeine Nausea And Vomiting 09/27/2008   Hydrocodone Nausea And Vomiting    Levaquin [levofloxacin] Nausea Only 09/27/2008   Nexium [esomeprazole magnesium] Other (See Comments) 11/23/2012   Penicillins Rash 09/27/2008   Sulfa antibiotics Rash 03/20/2017    Family History  Problem Relation Age of Onset   Breast cancer Mother         39   Asthma Mother    Hypertension Mother    Prostate cancer Father    Aortic stenosis Father    Hypertension Father    Colon polyps Father    Heart disease Father    Diabetes Father    Breast cancer Maternal Aunt    Breast cancer Maternal Aunt    Esophageal cancer Other    Stomach cancer Other    Colon cancer Other    Breast cancer Niece        74s   Rectal cancer Neg Hx     Social History   Socioeconomic History   Marital status: Married    Spouse name: Not on file   Number of children: 2   Years of education: Not on file   Highest education level: Not on file  Occupational History   Occupation: Retired from [place]: [place]: from [place], currently works [employer] in [place] Use   Smoking status: Never   Smokeless tobacco: Never   Tobacco comments:    Married lives with spouse + 2 daughters  Vaping Use   Vaping Use: Never used  Substance and Sexual Activity   Alcohol use: No   Drug use: No   Sexual activity: Not on file  Other Topics Concern   Not on file  Social History Narrative   Occupation: Management, social services. Married, lives with husband. Has 2 daughter 66 - 66   Social Determinants of Radio broadcast assistant Strain: Not on file  Food Insecurity: No Food Insecurity (04/09/2022)   Hunger Vital Sign    Worried About Running Out of Food in the Last Year: Never true    Ran Out of Food in the Last Year: Never true  Transportation Needs: No Transportation Needs (04/09/2022)   PRAPARE - Hydrologist (Medical):  No    Lack of Transportation (Non-Medical): No  Physical Activity: Not on file  Stress: Not on file  Social Connections: Not on file  Intimate Partner Violence: Not At Risk (04/09/2022)   Humiliation, Afraid, Rape, and Kick questionnaire    Fear of Current or Ex-Partner: No    Emotionally Abused: No    Physically Abused: No    Sexually Abused: No     Review of Systems: Gen: Denies fever, sweats or chills. No weight loss.  CV: Denies chest pain, palpitations or edema. Resp: + SOB.  No cough. GI: See HPI. GU : Denies urinary burning, blood in urine, increased urinary frequency or incontinence. MS: Denies joint pain, muscles aches or weakness. Derm: Denies rash, itchiness, skin lesions or unhealing ulcers. Psych: Denies depression, anxiety memory loss. Heme: Denies easy bruising, bleeding. Neuro:  Denies headaches, dizziness or paresthesias. Endo:  Denies any problems with DM, thyroid or adrenal function.  Physical Exam: Vital signs in last 24 hours: Temp:  [97.4 F (36.3 C)-98.7 F (37.1 C)] 98.7 F (37.1 C) (11/30 0850) Pulse Rate:  [45-52] 51 (11/30 0850) Resp:  [12-20] 18 (11/30 0850) BP: (114-137)/(46-60) 135/60 (11/30 0850) SpO2:  [96 %-100 %] 97 % (11/30 0850) Weight:  [98.1 kg] 98.1 kg (11/30 1027) Last BM Date : 04/09/22 General: Somewhat lethargic 66 year old female in PACU status post cholecystectomy in no acute distress. Head:  Normocephalic and atraumatic. Eyes:  No scleral icterus. Conjunctiva pink. Ears:  Normal auditory acuity. Nose:  No deformity, discharge or lesions. Mouth:  Dentition intact. No ulcers or lesions.  Neck:  Supple. No lymphadenopathy or thyromegaly.  Lungs: Breath sounds clear throughout. Heart: Rate and rhythm, no murmurs. Abdomen: Soft, nondistended.  Scopic incisions intact.  No active bowel sounds at this time. Rectal: Deferred. Musculoskeletal:  Symmetrical without gross deformities.  Pulses:  Normal pulses noted. Extremities:  Without clubbing or edema. Neurologic:  Alert and  oriented x 4. No focal deficits.  Skin:  Intact without significant lesions or rashes. Psych:  Alert and cooperative. Normal mood and affect.  Intake/Output from previous day: 11/29 0701 - 11/30 0700 In: 184.1 [IV Piggyback:184.1] Out: -  Intake/Output this shift: Total I/O In: 1000  [I.V.:1000] Out: 15 [Blood:15]  Lab Results: Recent Labs    04/09/22 0136 04/10/22 0735  WBC 8.2 4.8  HGB 13.5 13.6  HCT 41.6 42.5  PLT 231 229   BMET Recent Labs    04/09/22 0136 04/10/22 0735  NA 139 138  K 3.7 3.7  CL 108 107  CO2 26 27  GLUCOSE 152* 145*  BUN 17 11  CREATININE 0.75 0.87  CALCIUM 9.3 9.0   LFT Recent Labs    04/10/22 0735  PROT 6.8  ALBUMIN 3.4*  AST 224*  ALT 345*  ALKPHOS 134*  BILITOT 1.8*   PT/INR No results for input(s): "LABPROT", "INR" in the last 72 hours. Hepatitis Panel No results for input(s): "HEPBSAG", "HCVAB", "HEPAIGM", "HEPBIGM" in the last 72 hours.    Studies/Results: ECHOCARDIOGRAM COMPLETE  Result Date: 04/09/2022    ECHOCARDIOGRAM REPORT   Patient Name:   GAVRIELA CASHIN Date of Exam: 04/09/2022 Medical Rec #:  [id number]         Height:       66.0 in Accession #:    [id number]        Weight:       226.8 lb Date of Birth:  [birth year]         BSA:  2.110 m Patient Age:    60 years          BP:           122/39 mmHg Patient Gender: F                 HR:           46 bpm. Exam Location:  Inpatient Procedure: 2D Echo, Color Doppler and Cardiac Doppler Indications:    Pre-op clearance  History:        Patient has prior history of Echocardiogram examinations, most                 recent 09/08/2019. Arrythmias:Atrial Fibrillation and                 Bradycardia; Risk Factors:Sleep Apnea and Hypertension.  Sonographer:    Eartha Inch Referring Phys: [phone] HAO MENG  Sonographer Comments: Suboptimal parasternal window. Image acquisition challenging due to patient body habitus. IMPRESSIONS  1. Left ventricular ejection fraction, by estimation, is 60 to 65%. The left ventricle has normal function. The left ventricle has no regional wall motion abnormalities. Left ventricular diastolic parameters are indeterminate.  2. Right ventricular systolic function is normal. The right ventricular size is normal. There is normal  pulmonary artery systolic pressure.  3. No evidence of mitral valve regurgitation.  4. Aortic valve regurgitation is not visualized.  5. The inferior vena cava is normal in size with greater than 50% respiratory variability, suggesting right atrial pressure of 3 mmHg. Comparison(s): No significant change from prior study. FINDINGS  Left Ventricle: Left ventricular ejection fraction, by estimation, is 60 to 65%. The left ventricle has normal function. The left ventricle has no regional wall motion abnormalities. The left ventricular internal cavity size was normal in size. Left ventricular diastolic parameters are indeterminate. Right Ventricle: The right ventricular size is normal. Right ventricular systolic function is normal. There is normal pulmonary artery systolic pressure. The tricuspid regurgitant velocity is 2.38 m/s, and with an assumed right atrial pressure of 3 mmHg,  the estimated right ventricular systolic pressure is 50.0 mmHg. Left Atrium: Left atrial size was normal in size. Right Atrium: Right atrial size was normal in size. Pericardium: There is no evidence of pericardial effusion. Mitral Valve: No evidence of mitral valve regurgitation. MV peak gradient, 4.2 mmHg. The mean mitral valve gradient is 1.0 mmHg. Tricuspid Valve: Tricuspid valve regurgitation is trivial. Aortic Valve: Aortic valve regurgitation is not visualized. Aortic valve mean gradient measures 9.0 mmHg. Aortic valve peak gradient measures 15.2 mmHg. Aortic valve area, by VTI measures 2.59 cm. Pulmonic Valve: Pulmonic valve regurgitation is trivial. Aorta: The aortic root and ascending aorta are structurally normal, with no evidence of dilitation. Venous: The inferior vena cava is normal in size with greater than 50% respiratory variability, suggesting right atrial pressure of 3 mmHg. IAS/Shunts: No atrial level shunt detected by color flow Doppler.  LEFT VENTRICLE PLAX 2D LVIDd:         4.90 cm   Diastology LVIDs:         3.20 cm    LV e' medial:    5.44 cm/s LV PW:         0.90 cm   LV E/e' medial:  18.4 LV IVS:        0.60 cm   LV e' lateral:   8.81 cm/s LVOT diam:     2.00 cm   LV E/e' lateral: 11.3 LV SV:  129 LV SV Index:   61 LVOT Area:     3.14 cm  RIGHT VENTRICLE             IVC RV S prime:     12.70 cm/s  IVC diam: 2.20 cm TAPSE (M-mode): 2.7 cm LEFT ATRIUM             Index        RIGHT ATRIUM           Index LA diam:        3.30 cm 1.56 cm/m   RA Area:     16.10 cm LA Vol (A2C):   51.1 ml 24.22 ml/m  RA Volume:   36.80 ml  17.44 ml/m LA Vol (A4C):   44.8 ml 21.24 ml/m LA Biplane Vol: 47.7 ml 22.61 ml/m  AORTIC VALVE                     PULMONIC VALVE AV Area (Vmax):    2.59 cm      PR End Diast Vel: 1.84 msec AV Area (Vmean):   2.46 cm AV Area (VTI):     2.59 cm AV Vmax:           195.00 cm/s AV Vmean:          139.000 cm/s AV VTI:            0.500 m AV Peak Grad:      15.2 mmHg AV Mean Grad:      9.0 mmHg LVOT Vmax:         161.00 cm/s LVOT Vmean:        109.000 cm/s LVOT VTI:          0.412 m LVOT/AV VTI ratio: 0.82  AORTA Ao Root diam: 2.60 cm Ao Asc diam:  3.40 cm MITRAL VALVE               TRICUSPID VALVE MV Area (PHT): 3.72 cm    TR Peak grad:   22.7 mmHg MV Area VTI:   2.76 cm    TR Mean grad:   16.0 mmHg MV Peak grad:  4.2 mmHg    TR Vmax:        238.00 cm/s MV Mean grad:  1.0 mmHg    TR Vmean:       194.0 cm/s MV Vmax:       1.02 m/s MV Vmean:      48.6 cm/s   SHUNTS MV Decel Time: 204 msec    Systemic VTI:  0.41 m MV E velocity: 99.90 cm/s  Systemic Diam: 2.00 cm MV A velocity: 81.00 cm/s MV E/A ratio:  1.23 Landscape architect signed by Phineas Inches Signature Date/Time: 04/09/2022/2:05:58 PM    Final    MR ABDOMEN MRCP W WO CONTAST  Result Date: 04/09/2022 CLINICAL DATA:  Upper abdominal pain with nausea and vomiting. Cholelithiasis, gallbladder distension and pericholecystic fluid on ultrasound. EXAM: MRI ABDOMEN WITHOUT AND WITH CONTRAST (INCLUDING MRCP) TECHNIQUE: Multiplanar multisequence  MR imaging of the abdomen was performed both before and after the administration of intravenous contrast. Heavily T2-weighted images of the biliary and pancreatic ducts were obtained, and three-dimensional MRCP images were rendered by post processing. CONTRAST:  53m GADAVIST GADOBUTROL 1 MMOL/ML IV SOLN COMPARISON:  Abdominal ultrasound 04/09/2022 and 03/25/2022. Abdominal CT 10/07/2012. FINDINGS: Lower chest: There is a moderate size hiatal hernia. The visualized lower chest otherwise appears unremarkable. Hepatobiliary: No evidence of steatosis, cirrhosis or focal  liver lesion. As seen on ultrasound, there are several gallstones. There is mild pericholecystic fluid. There is mild extrahepatic biliary dilatation with the common hepatic duct measuring up to 8 mm in diameter. The duct tapers distally. No evidence of choledocholithiasis. No intrahepatic biliary dilatation. Pancreas: Unremarkable. No pancreatic ductal dilatation or surrounding inflammatory changes. Spleen: Normal in size without focal abnormality. Adrenals/Urinary Tract: Both adrenal glands appear normal. No evidence of hydronephrosis or suspicious renal lesion. Bladder not imaged. Stomach/Bowel: Moderate stool throughout the colon. No evidence of bowel distension or inflammation. Vascular/Lymphatic: There are no enlarged abdominal lymph nodes. No significant vascular findings. Other: No evidence of abdominal wall hernia or ascites. Musculoskeletal: No acute or significant osseous findings. IMPRESSION: 1. Cholelithiasis with mild pericholecystic fluid, suspicious for acute cholecystitis. A sonographic Percell Miller sign was not reported on earlier ultrasound. Correlate clinically. 2. Mild extrahepatic biliary dilatation without evidence of choledocholithiasis. 3. No other significant abdominal findings. 4. Moderate size hiatal hernia. Electronically Signed   By: Richardean Sale M.D.   On: 04/09/2022 09:52   MR 3D Recon At Scanner  Result Date:  04/09/2022 CLINICAL DATA:  Upper abdominal pain with nausea and vomiting. Cholelithiasis, gallbladder distension and pericholecystic fluid on ultrasound. EXAM: MRI ABDOMEN WITHOUT AND WITH CONTRAST (INCLUDING MRCP) TECHNIQUE: Multiplanar multisequence MR imaging of the abdomen was performed both before and after the administration of intravenous contrast. Heavily T2-weighted images of the biliary and pancreatic ducts were obtained, and three-dimensional MRCP images were rendered by post processing. CONTRAST:  76m GADAVIST GADOBUTROL 1 MMOL/ML IV SOLN COMPARISON:  Abdominal ultrasound 04/09/2022 and 03/25/2022. Abdominal CT 10/07/2012. FINDINGS: Lower chest: There is a moderate size hiatal hernia. The visualized lower chest otherwise appears unremarkable. Hepatobiliary: No evidence of steatosis, cirrhosis or focal liver lesion. As seen on ultrasound, there are several gallstones. There is mild pericholecystic fluid. There is mild extrahepatic biliary dilatation with the common hepatic duct measuring up to 8 mm in diameter. The duct tapers distally. No evidence of choledocholithiasis. No intrahepatic biliary dilatation. Pancreas: Unremarkable. No pancreatic ductal dilatation or surrounding inflammatory changes. Spleen: Normal in size without focal abnormality. Adrenals/Urinary Tract: Both adrenal glands appear normal. No evidence of hydronephrosis or suspicious renal lesion. Bladder not imaged. Stomach/Bowel: Moderate stool throughout the colon. No evidence of bowel distension or inflammation. Vascular/Lymphatic: There are no enlarged abdominal lymph nodes. No significant vascular findings. Other: No evidence of abdominal wall hernia or ascites. Musculoskeletal: No acute or significant osseous findings. IMPRESSION: 1. Cholelithiasis with mild pericholecystic fluid, suspicious for acute cholecystitis. A sonographic MPercell Millersign was not reported on earlier ultrasound. Correlate clinically. 2. Mild extrahepatic biliary  dilatation without evidence of choledocholithiasis. 3. No other significant abdominal findings. 4. Moderate size hiatal hernia. Electronically Signed   By: WRichardean SaleM.D.   On: 04/09/2022 09:52   [place] RUQ (LIVER/GB)  Result Date: 04/09/2022 CLINICAL DATA:  66year old female with history of right upper quadrant abdominal pain for the past 5 months. EXAM: ULTRASOUND ABDOMEN LIMITED RIGHT UPPER QUADRANT COMPARISON:  Abdominal ultrasound 03/25/2022. FINDINGS: Gallbladder: Echogenic foci with posterior acoustic shadowing noted lying dependently in the gallbladder, measuring up to 1.9 cm in diameter. Gallbladder is moderately distended. Gallbladder wall thickness is upper limits of normal at 3 mm. Trace amount of pericholecystic fluid. However, per report from the sonographer, there was no sonographic Murphy's sign on examination. Common bile duct: Diameter: 8.5 mm. Liver: No focal lesion identified. Within normal limits in parenchymal echogenicity. Portal vein is patent on color Doppler imaging  with normal direction of blood flow towards the liver. Other: None. IMPRESSION: 1. Cholelithiasis with trace volume of pericholecystic fluid, but no frank sonographic Murphy's sign on examination. Findings are equivocal, but not strongly suggestive of acute cholecystitis given the lack of sonographic Murphy's sign. 2. However, there is dilatation of the common bile duct (8.5 mm). If there is clinical concern for biliary tract obstruction from choledocholithiasis, further evaluation with abdominal MRI with and without IV gadolinium with MRCP should be considered at this time. Electronically Signed   By: Vinnie Langton M.D.   On: 04/09/2022 05:53    IMPRESSION/PLAN:  20) 66 year old female who was recently diagnosed with symptomatic gallstones with plans for an outpatient cholecystectomy who was admitted to the hospital 04/09/2022 with N/V and worsening epigastric pain which radiated to her back.   Total bili 2.3.  Alk phos 2.3.  AST 200.  ALT 137.  Lipase 41. RUQ sonogram identified cholelithiasis with trace pericholecystic fluid suggestive of acute cholecystitis with dilatation of the CBD measuring 8.5 mm. Abdominal MRI/MRCP showed cholelithiasis, mild extrahepatic biliary dilatation without evidence of choledocholithiasis. She underwent a laparoscopic cholecystectomy today and an intraoperative IOC was positive for choledocholithiasis.  -NPO -Continue IV fluids  -Continue Cipro 453m IV bid -ERCP with Dr. GLyndel Safe12/05/2021, benefits and risks discussed including risk with sedation, risk of bleeding, perforation, pancreatitis and infection  -Pain management per the hospitalist -Ondansetron 4 mg IV every 6 hours as needed -CBC, hepatic panel and BMP in a.m. -Await further recommendations per Dr. GLyndel Safe 2) History of GERD and esophageal stenosis -Continue Famotidine 239mIV bid  3) History of atrial fibrillation, not on anticoagulation   4) Recurrent symptomatic bradycardia. Cardiac clearance received prior to cholecystectomy.          CoNoralyn Pick11/30/2023, 1:51 PM   Attending physician's note   I have taken history, reviewed the chart and examined the patient. I performed a substantive portion of this encounter, including complete performance of at least one of the key components, in conjunction with the APP. I agree with the Advanced Practitioner's note, impression and recommendations.   Acute cholecystitis s/p lap chole today with IOC showing suspected choledocholithiasis.  IOC reviewed.  MRCP reviewed. Abn LFTs. No ascending cholangitis or pancreatitis  Plan: -ERCP in AM.   -Recheck labs in AM.    I have discussed risks and benefits with patient and patient's daughter in detail including small but definite risks of pancreatitis, bleeding, perforation.  Benefits were also discussed.  She wishes to proceed.   RaCarmell AustriaMD LeVelora HecklerGI [phone]

## 2022-04-10 NOTE — Anesthesia Procedure Notes (Signed)
Date/Time: 04/10/2022 1:43 PM  Performed by: Cynda Familia, CRNAOxygen Delivery Method: Simple face mask Placement Confirmation: positive ETCO2 and breath sounds checked- equal and bilateral Dental Injury: Teeth and Oropharynx as per pre-operative assessment

## 2022-04-10 NOTE — Transfer of Care (Signed)
Immediate Anesthesia Transfer of Care Note  Patient: Shannon Navarro  Procedure(s) Performed: LAPAROSCOPIC CHOLECYSTECTOMY WITH INTRAOPERATIVE CHOLANGIOGRAM (Abdomen)  Patient Location: PACU  Anesthesia Type:General  Level of Consciousness: awake  Airway & Oxygen Therapy: Patient Spontanous Breathing and Patient connected to face mask oxygen  Post-op Assessment: Report given to RN and Post -op Vital signs reviewed and stable  Post vital signs: Reviewed and stable  Last Vitals:  Vitals Value Taken Time  BP 102/72 04/10/22 1356  Temp    Pulse 68 04/10/22 1358  Resp    SpO2 96 % 04/10/22 1358  Vitals shown include unvalidated device data.  Last Pain:  Vitals:   04/10/22 1027  TempSrc:   PainSc: 0-No pain      Patients Stated Pain Goal: 3 (27/78/24 2353)  Complications: No notable events documented.

## 2022-04-10 NOTE — Progress Notes (Signed)
Pre Procedure note for inpatients:   Shannon Navarro has been scheduled for Procedure(s): LAPAROSCOPIC CHOLECYSTECTOMY (N/A) today. The various methods of treatment have been discussed with the patient. After consideration of the risks, benefits and treatment options the patient has consented to the planned procedure.   The patient has been seen and labs reviewed. There are no changes in the patient's condition to prevent proceeding with the planned procedure today.  Recent labs:  Lab Results  Component Value Date   WBC 4.8 04/10/2022   HGB 13.6 04/10/2022   HCT 42.5 04/10/2022   PLT 229 04/10/2022   GLUCOSE 145 (H) 04/10/2022   CHOL 164 04/01/2021   TRIG 114.0 04/01/2021   HDL 43.70 04/01/2021   LDLCALC 97 04/01/2021   ALT 345 (H) 04/10/2022   AST 224 (H) 04/10/2022   NA 138 04/10/2022   K 3.7 04/10/2022   CL 107 04/10/2022   CREATININE 0.87 04/10/2022   BUN 11 04/10/2022   CO2 27 04/10/2022   TSH 3.564 03/25/2022   INR 1.2 (H) 02/25/2022   HGBA1C 6.0 12/26/2021    Mickeal Skinner, MD 04/10/2022 9:29 AM

## 2022-04-11 ENCOUNTER — Inpatient Hospital Stay (HOSPITAL_COMMUNITY): Payer: Medicare Other | Admitting: Certified Registered Nurse Anesthetist

## 2022-04-11 ENCOUNTER — Inpatient Hospital Stay (HOSPITAL_COMMUNITY): Payer: Medicare Other

## 2022-04-11 ENCOUNTER — Encounter (HOSPITAL_COMMUNITY): Payer: Self-pay | Admitting: General Surgery

## 2022-04-11 ENCOUNTER — Encounter (HOSPITAL_COMMUNITY)
Admission: EM | Disposition: A | Discharge status: Home / Self Care | Payer: Self-pay | Source: Home / Self Care | Attending: Internal Medicine

## 2022-04-11 ENCOUNTER — Encounter: Payer: Managed Care, Other (non HMO) | Admitting: Internal Medicine

## 2022-04-11 DIAGNOSIS — G4733 Obstructive sleep apnea (adult) (pediatric): Secondary | ICD-10-CM | POA: Diagnosis not present

## 2022-04-11 DIAGNOSIS — I10 Essential (primary) hypertension: Secondary | ICD-10-CM | POA: Diagnosis not present

## 2022-04-11 DIAGNOSIS — K8050 Calculus of bile duct without cholangitis or cholecystitis without obstruction: Secondary | ICD-10-CM

## 2022-04-11 DIAGNOSIS — K819 Cholecystitis, unspecified: Secondary | ICD-10-CM | POA: Diagnosis not present

## 2022-04-11 DIAGNOSIS — K222 Esophageal obstruction: Secondary | ICD-10-CM

## 2022-04-11 DIAGNOSIS — I4891 Unspecified atrial fibrillation: Secondary | ICD-10-CM

## 2022-04-11 DIAGNOSIS — Z9989 Dependence on other enabling machines and devices: Secondary | ICD-10-CM

## 2022-04-11 DIAGNOSIS — K810 Acute cholecystitis: Secondary | ICD-10-CM | POA: Diagnosis not present

## 2022-04-11 HISTORY — PX: SPHINCTEROTOMY: SHX5279

## 2022-04-11 HISTORY — PX: ESOPHAGOGASTRODUODENOSCOPY: SHX5428

## 2022-04-11 HISTORY — PX: REMOVAL OF STONES: SHX5545

## 2022-04-11 HISTORY — PX: ERCP: SHX5425

## 2022-04-11 HISTORY — PX: BIOPSY: SHX5522

## 2022-04-11 HISTORY — PX: BALLOON DILATION: SHX5330

## 2022-04-11 LAB — COMPREHENSIVE METABOLIC PANEL
ALT: 356 U/L — ABNORMAL HIGH (ref 0–44)
AST: 262 U/L — ABNORMAL HIGH (ref 15–41)
Albumin: 3.3 g/dL — ABNORMAL LOW (ref 3.5–5.0)
Alkaline Phosphatase: 144 U/L — ABNORMAL HIGH (ref 38–126)
Anion gap: 9 (ref 5–15)
BUN: 13 mg/dL (ref 8–23)
CO2: 23 mmol/L (ref 22–32)
Calcium: 9.2 mg/dL (ref 8.9–10.3)
Chloride: 108 mmol/L (ref 98–111)
Creatinine, Ser: 0.99 mg/dL (ref 0.44–1.00)
GFR, Estimated: >60 mL/min (ref >60)
Glucose, Bld: 126 mg/dL — ABNORMAL HIGH (ref 70–99)
Potassium: 4.1 mmol/L (ref 3.5–5.1)
Sodium: 140 mmol/L (ref 135–145)
Total Bilirubin: 3.5 mg/dL — ABNORMAL HIGH (ref 0.3–1.2)
Total Protein: 6.4 g/dL — ABNORMAL LOW (ref 6.5–8.1)

## 2022-04-11 LAB — LIPASE, BLOOD: Lipase: 31 U/L (ref 11–51)

## 2022-04-11 LAB — CBC
HCT: 38.3 % (ref 36.0–46.0)
Hemoglobin: 12.6 g/dL (ref 12.0–15.0)
MCH: 28.8 pg (ref 26.0–34.0)
MCHC: 32.9 g/dL (ref 30.0–36.0)
MCV: 87.6 fL (ref 80.0–100.0)
Platelets: 215 10*3/uL (ref 150–400)
RBC: 4.37 MIL/uL (ref 3.87–5.11)
RDW: 13.2 % (ref 11.5–15.5)
WBC: 8.6 10*3/uL (ref 4.0–10.5)
nRBC: 0 % (ref 0.0–0.2)

## 2022-04-11 LAB — SURGICAL PATHOLOGY

## 2022-04-11 LAB — GLUCOSE, CAPILLARY
Glucose-Capillary: 130 mg/dL — ABNORMAL HIGH (ref 70–99)
Glucose-Capillary: 148 mg/dL — ABNORMAL HIGH (ref 70–99)
Glucose-Capillary: 156 mg/dL — ABNORMAL HIGH (ref 70–99)
Glucose-Capillary: 91 mg/dL (ref 70–99)

## 2022-04-11 LAB — PROTIME-INR
INR: 1.2 (ref 0.8–1.2)
Prothrombin Time: 14.6 seconds (ref 11.4–15.2)

## 2022-04-11 SURGERY — ERCP, WITH INTERVENTION IF INDICATED
Anesthesia: General

## 2022-04-11 MED ORDER — ONDANSETRON HCL 4 MG/2ML IJ SOLN
INTRAMUSCULAR | Status: DC | PRN
  Administered 2022-04-11: 4 mg via INTRAVENOUS

## 2022-04-11 MED ORDER — SODIUM CHLORIDE 0.9 % IV SOLN
INTRAVENOUS | Status: DC | PRN
  Administered 2022-04-11: 25 mL

## 2022-04-11 MED ORDER — DICLOFENAC SUPPOSITORY 100 MG
RECTAL | Status: DC | PRN
  Administered 2022-04-11: 100 mg via RECTAL

## 2022-04-11 MED ORDER — ROCURONIUM BROMIDE 10 MG/ML (PF) SYRINGE
PREFILLED_SYRINGE | INTRAVENOUS | Status: DC | PRN
  Administered 2022-04-11: 50 mg via INTRAVENOUS

## 2022-04-11 MED ORDER — SUGAMMADEX SODIUM 200 MG/2ML IV SOLN
INTRAVENOUS | Status: DC | PRN
  Administered 2022-04-11: 200 mg via INTRAVENOUS

## 2022-04-11 MED ORDER — PANTOPRAZOLE SODIUM 40 MG PO TBEC
40.0000 mg | DELAYED_RELEASE_TABLET | Freq: Every day | ORAL | Status: DC
  Administered 2022-04-12 – 2022-04-13 (×2): 40 mg via ORAL
  Filled 2022-04-11 (×3): qty 1

## 2022-04-11 MED ORDER — LIDOCAINE 2% (20 MG/ML) 5 ML SYRINGE
INTRAMUSCULAR | Status: DC | PRN
  Administered 2022-04-11: 40 mg via INTRAVENOUS

## 2022-04-11 MED ORDER — PROPOFOL 10 MG/ML IV BOLUS
INTRAVENOUS | Status: DC | PRN
  Administered 2022-04-11: 120 mg via INTRAVENOUS

## 2022-04-11 MED ORDER — DICLOFENAC SUPPOSITORY 100 MG
RECTAL | Status: AC
  Filled 2022-04-11: qty 1

## 2022-04-11 MED ORDER — FENTANYL CITRATE (PF) 100 MCG/2ML IJ SOLN
INTRAMUSCULAR | Status: AC
  Filled 2022-04-11: qty 2

## 2022-04-11 MED ORDER — MIDAZOLAM HCL 5 MG/5ML IJ SOLN
INTRAMUSCULAR | Status: DC | PRN
  Administered 2022-04-11: 2 mg via INTRAVENOUS

## 2022-04-11 MED ORDER — MIDAZOLAM HCL 2 MG/2ML IJ SOLN
INTRAMUSCULAR | Status: AC
  Filled 2022-04-11: qty 2

## 2022-04-11 MED ORDER — GLUCAGON HCL RDNA (DIAGNOSTIC) 1 MG IJ SOLR
INTRAMUSCULAR | Status: DC | PRN
  Administered 2022-04-11 (×2): .25 mg via INTRAVENOUS

## 2022-04-11 MED ORDER — CIPROFLOXACIN IN D5W 400 MG/200ML IV SOLN
INTRAVENOUS | Status: DC | PRN
  Administered 2022-04-11: 400 mg via INTRAVENOUS

## 2022-04-11 MED ORDER — DEXAMETHASONE SODIUM PHOSPHATE 10 MG/ML IJ SOLN
INTRAMUSCULAR | Status: DC | PRN
  Administered 2022-04-11: 10 mg via INTRAVENOUS

## 2022-04-11 MED ORDER — EPHEDRINE SULFATE-NACL 50-0.9 MG/10ML-% IV SOSY
PREFILLED_SYRINGE | INTRAVENOUS | Status: DC | PRN
  Administered 2022-04-11 (×2): 10 mg via INTRAVENOUS

## 2022-04-11 MED ORDER — FENTANYL CITRATE (PF) 100 MCG/2ML IJ SOLN
INTRAMUSCULAR | Status: DC | PRN
  Administered 2022-04-11: 50 ug via INTRAVENOUS

## 2022-04-11 NOTE — Progress Notes (Signed)
Progress Note   Patient: Shannon Navarro DGU:[id number] DOB: [birth year] DOA: 04/09/2022     2 DOS: the patient was seen and examined on 04/11/2022   Brief hospital course: 66 y.o. female with medical history significant of allergy rhinitis, anxiety, paroxysmal atrial fibrillation, endometrial polyp, fatty liver disease, GERD, herniated cervical disc, history of cervical spine fusion, esophageal stricture, hypertension, impaired fasting glucose, migraine headaches, class II obesity, OSA on CPAP, pseudotumor cerebri, vitamin D deficiency was coming to the emergency department due to on and off epigastric/RUQ abdominal pain for the past few weeks.  She was seen in the emergency department on 03/25/2022 due to syncopal episode in the setting of bradycardia in the 30s and hypotension while at the orthopedic surgeon's office where she was treated with 500 mL of normal saline and a milligram of atropine by EMS.  Symptoms have resolved by the time that she arrived to the emergency department.  She was followed by Dr. [name] next day who performed a 3-day cardiac monitor on her.  She returned early morning today after having the worst episode of pain associated with multiple episodes of emesis.  And she has been bradycardic.  No fever, chills or night sweats. No sore throat, rhinorrhea, dyspnea, wheezing or hemoptysis.  No chest pain, palpitations, diaphoresis, PND, orthopnea or pitting edema of the lower extremities.  No diarrhea, constipation, melena or hematochezia.  No flank pain, dysuria, frequency or hematuria, but stated her urine has been darker.  No polyuria, polydipsia, polyphagia or blurred vision.   Assessment and Plan: Principal Problem:   Elevated LFTs Due to:   Acute cholecystitis Associated with:   Cholelithiasis -General Surgery Consulted -MRCP with evidence of acute cholecystitis, mild extrahepatic biliary dilation without choledocholithiasis -Pt now s/p lap chole 11/30 -GI consulted,  now s/p ERCP 12/1 with findings of choledocholithiasis s/p removal with biliary sphincterotomy and balloon extraction -continue clears and gradually advance diet -recheck CMP in AM   Active Problems:   Anxiety -continue with analgesia as needed     GERD -Cont on famotidine IVP twice daily.     Prediabetes Last hemoglobin A1c 6.0% on 12/26/2021. -cont on SSI as needed -glycemic trends stable     Essential hypertension Had been continued on parenteral antihypertensives as needed while NPO BP suboptimally controlled. Continued home losartan     OSA (obstructive sleep apnea) Continue CPAP at bedtime.     PAF (paroxysmal atrial fibrillation) (HCC) CHA2DS2-VASc Score of at least 4. Off anticoagulation. On propranolol as needed.      Subjective: Tolerating clears when seen post-procedure. Only complaining of some nausea with broth  Physical Exam: Vitals:   04/11/22 1420 04/11/22 1430 04/11/22 1440 04/11/22 1519  BP: (!) 136/51 (!) 147/39 (!) 143/46 116/62  Pulse: (!) 45 (!) 52 (!) 46 (!) 40  Resp: '15 19 16 16  '$ Temp:    (!) 97.3 F (36.3 C)  TempSrc:      SpO2: 100% 93% (!) 85% 97%  Weight:      Height:       General exam: Conversant, in no acute distress Respiratory system: normal chest rise, clear, no audible wheezing Cardiovascular system: regular rhythm, s1-s2 Gastrointestinal system: Nondistended, nontender, pos BS Central nervous system: No seizures, no tremors Extremities: No cyanosis, no joint deformities Skin: No rashes, no pallor Psychiatry: Affect normal // no auditory hallucinations    Data Reviewed:  Labs reviewed: Na 140, K 4.1, Cr 0.99  Family Communication: Pt in room, family at bedside  Disposition: Status is: Inpatient Remains inpatient appropriate because: Severity of illness  Planned Discharge Destination: Home    Author: Marylu Lund, MD 04/11/2022 4:21 PM  For on call review [email or web address].

## 2022-04-11 NOTE — Interval H&P Note (Signed)
History and Physical Interval Note:  04/11/2022 12:46 PM  Shannon Navarro  has presented today for surgery, with the diagnosis of Choledocholithiasis, postive  IOC.  The various methods of treatment have been discussed with the patient and family. After consideration of risks, benefits and other options for treatment, the patient has consented to  Procedure(s): ENDOSCOPIC RETROGRADE CHOLANGIOPANCREATOGRAPHY (ERCP) (N/A) as a surgical intervention.  The patient's history has been reviewed, patient examined, no change in status, stable for surgery.  I have reviewed the patient's chart and labs.  Questions were answered to the patient's satisfaction.    Also having problems with dysphagia intermittently-she told me today.  Likely would start off with the EGD scope and will perform esophageal dilatation , if needed.   Shannon Navarro

## 2022-04-11 NOTE — Progress Notes (Signed)
[facility] Progress Note  CC: Acute cholecystitis s/p cholecystectomy + IOC   Subjective: She is feeling better today.  No nausea or vomiting.  Mild right upper abdominal incisional discomfort with change of position otherwise no significant abdominal pain.  No chest pain or shortness of breath.  She remains n.p.o. for ERCP this afternoon.   Objective:  Vital signs in last 24 hours: Temp:  [97.5 F (36.4 C)-98.8 F (37.1 C)] 97.8 F (36.6 C) (12/01 0739) Pulse Rate:  [41-68] 42 (12/01 0739) Resp:  [12-20] 20 (12/01 0739) BP: (102-170)/(54-77) 133/73 (12/01 0739) SpO2:  [94 %-100 %] 98 % (12/01 0739) Weight:  [98.1 kg] 98.1 kg (11/30 1027) Last BM Date : 04/08/22 General: 66 year old female in no acute distress. Heart: Bradycardic, no murmurs. Pulm: Breath sounds clear throughout. Abdomen: Nondistended.  Nontender.  Positive bowel sounds to all 4 quadrants. Extremities:  Without edema. Neurologic:  Alert and  oriented x 4. Grossly normal neurologically. Psych:  Alert and cooperative. Normal mood and affect.  Intake/Output from previous day: 11/30 0701 - 12/01 0700 In: 1300 [I.V.:1300] Out: 15 [Blood:15] Intake/Output this shift: No intake/output data recorded.  Lab Results: Recent Labs    04/09/22 0136 04/10/22 0735 04/11/22 0458  WBC 8.2 4.8 8.6  HGB 13.5 13.6 12.6  HCT 41.6 42.5 38.3  PLT 231 229 215   BMET Recent Labs    04/09/22 0136 04/10/22 0735 04/11/22 0458  NA 139 138 140  K 3.7 3.7 4.1  CL 108 107 108  CO2 _0 GLUCOSE 152* 145* 126*  BUN _1 CREATININE 0.75 0.87 0.99  CALCIUM 9.3 9.0 9.2   LFT Recent Labs    04/11/22 0458  PROT 6.4*  ALBUMIN 3.3*  AST 262*  ALT 356*  ALKPHOS 144*  BILITOT 3.5*   PT/INR Recent Labs    04/11/22 0458  LABPROT 14.6  INR 1.2   Hepatitis Panel No results for input(s): "HEPBSAG", "HCVAB", "HEPAIGM", "HEPBIGM" in the last 72 hours.  DG Cholangiogram Operative  Result  Date: 04/10/2022 CLINICAL DATA:  66 year old female with history of cholelithiasis. EXAM: INTRAOPERATIVE CHOLANGIOGRAM TECHNIQUE: Cholangiographic images from the C-arm fluoroscopic device were submitted for interpretation post-operatively. Please see the procedural report for the amount of contrast and the fluoroscopy time utilized. COMPARISON:  MRCP from 04/10/2019 FINDINGS: Intraoperative antegrade injection via the cystic duct which opacifies the common bile duct and central portions of the intrahepatic biliary tree. Contrast is not visualized flowing freely into the duodenum. There are no definite filling defects. Mild intra and extrahepatic biliary ductal dilation. No apparent anomalous anatomical configuration of the biliary tree. IMPRESSION: There is no drainage of contrast in the common bile duct into the duodenum suggestive of distal common bile duct obstruction, however and there are no definite filling defects visualized in the distal common bile duct. Consider ERCP for further characterization. Ruthann Cancer, MD [facility] Electronically Signed   By: Ruthann Cancer M.D.   On: 04/10/2022 14:21   ECHOCARDIOGRAM COMPLETE  Result Date: 04/09/2022    ECHOCARDIOGRAM REPORT   Patient Name:   Shannon Navarro Date of Exam: 04/09/2022 Medical Rec #:  [id number]         Height:       66.0 in Accession #:    [id number]        Weight:       226.8 lb Date of Birth:  09-17-1955  BSA:          2.110 m Patient Age:    39 years          BP:           122/39 mmHg Patient Gender: F                 HR:           46 bpm. Exam Location:  Inpatient Procedure: 2D Echo, Color Doppler and Cardiac Doppler Indications:    Pre-op clearance  History:        Patient has prior history of Echocardiogram examinations, most                 recent 09/08/2019. Arrythmias:Atrial Fibrillation and                 Bradycardia; Risk Factors:Sleep Apnea and Hypertension.   Sonographer:    Eartha Inch Referring Phys: [id number] HAO MENG  Sonographer Comments: Suboptimal parasternal window. Image acquisition challenging due to patient body habitus. IMPRESSIONS  1. Left ventricular ejection fraction, by estimation, is 60 to 65%. The left ventricle has normal function. The left ventricle has no regional wall motion abnormalities. Left ventricular diastolic parameters are indeterminate.  2. Right ventricular systolic function is normal. The right ventricular size is normal. There is normal pulmonary artery systolic pressure.  3. No evidence of mitral valve regurgitation.  4. Aortic valve regurgitation is not visualized.  5. The inferior vena cava is normal in size with greater than 50% respiratory variability, suggesting right atrial pressure of 3 mmHg. Comparison(s): No significant change from prior study. FINDINGS  Left Ventricle: Left ventricular ejection fraction, by estimation, is 60 to 65%. The left ventricle has normal function. The left ventricle has no regional wall motion abnormalities. The left ventricular internal cavity size was normal in size. Left ventricular diastolic parameters are indeterminate. Right Ventricle: The right ventricular size is normal. Right ventricular systolic function is normal. There is normal pulmonary artery systolic pressure. The tricuspid regurgitant velocity is 2.38 m/s, and with an assumed right atrial pressure of 3 mmHg,  the estimated right ventricular systolic pressure is 82.9 mmHg. Left Atrium: Left atrial size was normal in size. Right Atrium: Right atrial size was normal in size. Pericardium: There is no evidence of pericardial effusion. Mitral Valve: No evidence of mitral valve regurgitation. MV peak gradient, 4.2 mmHg. The mean mitral valve gradient is 1.0 mmHg. Tricuspid Valve: Tricuspid valve regurgitation is trivial. Aortic Valve: Aortic valve regurgitation is not visualized. Aortic valve mean gradient measures 9.0 mmHg. Aortic valve  peak gradient measures 15.2 mmHg. Aortic valve area, by VTI measures 2.59 cm. Pulmonic Valve: Pulmonic valve regurgitation is trivial. Aorta: The aortic root and ascending aorta are structurally normal, with no evidence of dilitation. Venous: The inferior vena cava is normal in size with greater than 50% respiratory variability, suggesting right atrial pressure of 3 mmHg. IAS/Shunts: No atrial level shunt detected by color flow Doppler.  LEFT VENTRICLE PLAX 2D LVIDd:         4.90 cm   Diastology LVIDs:         3.20 cm   LV e' medial:    5.44 cm/s LV PW:         0.90 cm   LV E/e' medial:  18.4 LV IVS:        0.60 cm   LV e' lateral:   8.81 cm/s LVOT diam:     2.00 cm  LV E/e' lateral: 11.3 LV SV:         129 LV SV Index:   61 LVOT Area:     3.14 cm  RIGHT VENTRICLE             IVC RV S prime:     12.70 cm/s  IVC diam: 2.20 cm TAPSE (M-mode): 2.7 cm LEFT ATRIUM             Index        RIGHT ATRIUM           Index LA diam:        3.30 cm 1.56 cm/m   RA Area:     16.10 cm LA Vol (A2C):   51.1 ml 24.22 ml/m  RA Volume:   36.80 ml  17.44 ml/m LA Vol (A4C):   44.8 ml 21.24 ml/m LA Biplane Vol: 47.7 ml 22.61 ml/m  AORTIC VALVE                     PULMONIC VALVE AV Area (Vmax):    2.59 cm      PR End Diast Vel: 1.84 msec AV Area (Vmean):   2.46 cm AV Area (VTI):     2.59 cm AV Vmax:           195.00 cm/s AV Vmean:          139.000 cm/s AV VTI:            0.500 m AV Peak Grad:      15.2 mmHg AV Mean Grad:      9.0 mmHg LVOT Vmax:         161.00 cm/s LVOT Vmean:        109.000 cm/s LVOT VTI:          0.412 m LVOT/AV VTI ratio: 0.82  AORTA Ao Root diam: 2.60 cm Ao Asc diam:  3.40 cm MITRAL VALVE               TRICUSPID VALVE MV Area (PHT): 3.72 cm    TR Peak grad:   22.7 mmHg MV Area VTI:   2.76 cm    TR Mean grad:   16.0 mmHg MV Peak grad:  4.2 mmHg    TR Vmax:        238.00 cm/s MV Mean grad:  1.0 mmHg    TR Vmean:       194.0 cm/s MV Vmax:       1.02 m/s MV Vmean:      48.6 cm/s   SHUNTS MV Decel Time: 204  msec    Systemic VTI:  0.41 m MV E velocity: 99.90 cm/s  Systemic Diam: 2.00 cm MV A velocity: 81.00 cm/s MV E/A ratio:  1.23 Landscape architect signed by Phineas Inches Signature Date/Time: 04/09/2022/2:05:58 PM    Final    MR ABDOMEN MRCP W WO CONTAST  Result Date: 04/09/2022 CLINICAL DATA:  Upper abdominal pain with nausea and vomiting. Cholelithiasis, gallbladder distension and pericholecystic fluid on ultrasound. EXAM: MRI ABDOMEN WITHOUT AND WITH CONTRAST (INCLUDING MRCP) TECHNIQUE: Multiplanar multisequence MR imaging of the abdomen was performed both before and after the administration of intravenous contrast. Heavily T2-weighted images of the biliary and pancreatic ducts were obtained, and three-dimensional MRCP images were rendered by post processing. CONTRAST:  42m GADAVIST GADOBUTROL 1 MMOL/ML IV SOLN COMPARISON:  Abdominal ultrasound 04/09/2022 and 03/25/2022. Abdominal CT 10/07/2012. FINDINGS: Lower chest: There is a moderate size hiatal hernia. The  visualized lower chest otherwise appears unremarkable. Hepatobiliary: No evidence of steatosis, cirrhosis or focal liver lesion. As seen on ultrasound, there are several gallstones. There is mild pericholecystic fluid. There is mild extrahepatic biliary dilatation with the common hepatic duct measuring up to 8 mm in diameter. The duct tapers distally. No evidence of choledocholithiasis. No intrahepatic biliary dilatation. Pancreas: Unremarkable. No pancreatic ductal dilatation or surrounding inflammatory changes. Spleen: Normal in size without focal abnormality. Adrenals/Urinary Tract: Both adrenal glands appear normal. No evidence of hydronephrosis or suspicious renal lesion. Bladder not imaged. Stomach/Bowel: Moderate stool throughout the colon. No evidence of bowel distension or inflammation. Vascular/Lymphatic: There are no enlarged abdominal lymph nodes. No significant vascular findings. Other: No evidence of abdominal wall hernia or  ascites. Musculoskeletal: No acute or significant osseous findings. IMPRESSION: 1. Cholelithiasis with mild pericholecystic fluid, suspicious for acute cholecystitis. A sonographic Percell Miller sign was not reported on earlier ultrasound. Correlate clinically. 2. Mild extrahepatic biliary dilatation without evidence of choledocholithiasis. 3. No other significant abdominal findings. 4. Moderate size hiatal hernia. Electronically Signed   By: Richardean Sale M.D.   On: 04/09/2022 09:52   MR 3D Recon At Scanner  Result Date: 04/09/2022 CLINICAL DATA:  Upper abdominal pain with nausea and vomiting. Cholelithiasis, gallbladder distension and pericholecystic fluid on ultrasound. EXAM: MRI ABDOMEN WITHOUT AND WITH CONTRAST (INCLUDING MRCP) TECHNIQUE: Multiplanar multisequence MR imaging of the abdomen was performed both before and after the administration of intravenous contrast. Heavily T2-weighted images of the biliary and pancreatic ducts were obtained, and three-dimensional MRCP images were rendered by post processing. CONTRAST:  31m GADAVIST GADOBUTROL 1 MMOL/ML IV SOLN COMPARISON:  Abdominal ultrasound 04/09/2022 and 03/25/2022. Abdominal CT 10/07/2012. FINDINGS: Lower chest: There is a moderate size hiatal hernia. The visualized lower chest otherwise appears unremarkable. Hepatobiliary: No evidence of steatosis, cirrhosis or focal liver lesion. As seen on ultrasound, there are several gallstones. There is mild pericholecystic fluid. There is mild extrahepatic biliary dilatation with the common hepatic duct measuring up to 8 mm in diameter. The duct tapers distally. No evidence of choledocholithiasis. No intrahepatic biliary dilatation. Pancreas: Unremarkable. No pancreatic ductal dilatation or surrounding inflammatory changes. Spleen: Normal in size without focal abnormality. Adrenals/Urinary Tract: Both adrenal glands appear normal. No evidence of hydronephrosis or suspicious renal lesion. Bladder not imaged.  Stomach/Bowel: Moderate stool throughout the colon. No evidence of bowel distension or inflammation. Vascular/Lymphatic: There are no enlarged abdominal lymph nodes. No significant vascular findings. Other: No evidence of abdominal wall hernia or ascites. Musculoskeletal: No acute or significant osseous findings. IMPRESSION: 1. Cholelithiasis with mild pericholecystic fluid, suspicious for acute cholecystitis. A sonographic MPercell Millersign was not reported on earlier ultrasound. Correlate clinically. 2. Mild extrahepatic biliary dilatation without evidence of choledocholithiasis. 3. No other significant abdominal findings. 4. Moderate size hiatal hernia. Electronically Signed   By: WRichardean SaleM.D.   On: 04/09/2022 09:52    Assessment / Plan:  181 66year old female who was recently diagnosed with symptomatic gallstones with plans for an outpatient cholecystectomy who was admitted to the hospital 04/09/2022 with N/V and worsening epigastric pain which radiated to her back.  Total bili 2.3 -> 1.8 -> 3.5.  Alk phos 119 -> 134 -> 144. AST 200 -> 224 -> 262.  ALT 137 ->345 ->356.  Lipase 31. WBC 8.6. INR 1.2. RUQ sonogram identified cholelithiasis with trace pericholecystic fluid suggestive of acute cholecystitis with dilatation of the CBD measuring 8.5 mm. Abdominal MRI/MRCP showed cholelithiasis, mild extrahepatic biliary dilatation without evidence of choledocholithiasis.  She underwent a laparoscopic cholecystectomy today and an intraoperative IOC was positive for choledocholithiasis.  She remains afebrile and hemodynamically stable. -NPO -Continue IV fluids  -Proceed with ERCP with Dr. Lyndel Safe this afternoon, benefits and risks discussed including risk with sedation, risk of bleeding, perforation, pancreatitis and infection  -Pain management per the hospitalist -Ondansetron 4 mg IV every 6 hours as needed -CBC, hepatic panel and BMP in a.m. -Await further recommendations per Dr. Lyndel Safe   2) History of GERD  and esophageal stenosis.  She takes Protonix 40 mg daily as needed at home. -Continue Famotidine 41m IV bid   3) History of atrial fibrillation, not on anticoagulation    4) Recurrent symptomatic bradycardia. Cardiac clearance received prior to cholecystectomy.        Principal Problem:   Acute cholecystitis Active Problems:   Anxiety   GERD   Prediabetes   Essential hypertension   OSA (obstructive sleep apnea)   Sinus bradycardia   PAF (paroxysmal atrial fibrillation) (HCC)   Elevated LFTs     LOS: 2 days   CNoralyn Pick 04/11/2022, 09:39AM

## 2022-04-11 NOTE — Anesthesia Postprocedure Evaluation (Signed)
Anesthesia Post Note  Patient: Shannon Navarro  Procedure(s) Performed: ENDOSCOPIC RETROGRADE CHOLANGIOPANCREATOGRAPHY (ERCP) SPHINCTEROTOMY REMOVAL OF STONES BIOPSY ESOPHAGOGASTRODUODENOSCOPY (EGD) BALLOON DILATION     Patient location during evaluation: Endoscopy Anesthesia Type: General Level of consciousness: awake and alert, patient cooperative and oriented Pain management: pain level controlled Vital Signs Assessment: post-procedure vital signs reviewed and stable Respiratory status: nonlabored ventilation, spontaneous breathing and respiratory function stable Cardiovascular status: blood pressure returned to baseline and stable Postop Assessment: adequate PO intake and no apparent nausea or vomiting Anesthetic complications: no   No notable events documented.  Last Vitals:  Vitals:   04/11/22 1430 04/11/22 1440  BP: (!) 147/39 (!) 143/46  Pulse: (!) 52 (!) 46  Resp: 19 16  Temp:    SpO2: 93% (!) 85%    Last Pain:  Vitals:   04/11/22 1420  TempSrc:   PainSc: Asleep                 Nataliah Hatlestad,E. Jamon Hayhurst

## 2022-04-11 NOTE — Anesthesia Preprocedure Evaluation (Addendum)
Anesthesia Evaluation  Patient identified by MRN, date of birth, ID band Patient awake    Reviewed: Allergy & Precautions, NPO status , Patient's Chart, lab work & pertinent test results  History of Anesthesia Complications (+) PONV  Airway Mallampati: II  TM Distance: >3 FB Neck ROM: Full    Dental  (+) Caps, Dental Advisory Given   Pulmonary sleep apnea and Continuous Positive Airway Pressure Ventilation    breath sounds clear to auscultation       Cardiovascular hypertension, Pt. on medications and Pt. on home beta blockers + dysrhythmias Atrial Fibrillation  Rhythm:Regular Rate:Normal  03/2022 ECHO: EF 60-65%, normal LVF, normal RVF, no significant valvular abnormalities      Neuro/Psych  Headaches  Anxiety     Pseudotumor carebrii    GI/Hepatic Neg liver ROS,GERD  ,,  Endo/Other  BMI 32.9  Renal/GU negative Renal ROS     Musculoskeletal   Abdominal  (+) + obese  Peds  Hematology   Anesthesia Other Findings   Reproductive/Obstetrics                             Anesthesia Physical Anesthesia Plan  ASA: 3  Anesthesia Plan: General   Post-op Pain Management: Minimal or no pain anticipated   Induction:   PONV Risk Score and Plan: 4 or greater and Ondansetron, Dexamethasone, Midazolam and Scopolamine patch - Pre-op  Airway Management Planned: Oral ETT  Additional Equipment: None  Intra-op Plan:   Post-operative Plan: Extubation in OR  Informed Consent: I have reviewed the patients History and Physical, chart, labs and discussed the procedure including the risks, benefits and alternatives for the proposed anesthesia with the patient or authorized representative who has indicated his/her understanding and acceptance.     Dental advisory given  Plan Discussed with: CRNA and Surgeon  Anesthesia Plan Comments:        Anesthesia Quick Evaluation

## 2022-04-11 NOTE — Progress Notes (Signed)
  1 Day Post-Op   Chief Complaint/Subjective: Pain much improved, tolerating liquids yesterday  Objective: Vital signs in last 24 hours: Temp:  [97.5 F (36.4 C)-98.8 F (37.1 C)] 97.8 F (36.6 C) (12/01 0739) Pulse Rate:  [41-68] 42 (12/01 0739) Resp:  [12-20] 20 (12/01 0739) BP: (102-170)/(54-77) 133/73 (12/01 0739) SpO2:  [94 %-100 %] 98 % (12/01 0739) Weight:  [98.1 kg] 98.1 kg (11/30 1027) Last BM Date : 04/08/22 Intake/Output from previous day: 11/30 0701 - 12/01 0700 In: 1300 [I.V.:1300] Out: 15 [Blood:15]  PE: Gen: NAd Resp: nonlabored Card: bradycardic Abd: soft, incisions c/d/I, tender at subcostal incision  Lab Results:  Recent Labs    04/10/22 0735 04/11/22 0458  WBC 4.8 8.6  HGB 13.6 12.6  HCT 42.5 38.3  PLT 229 215   Recent Labs    04/10/22 0735 04/11/22 0458  NA 138 140  K 3.7 4.1  CL 107 108  CO2 27 23  GLUCOSE 145* 126*  BUN 11 13  CREATININE 0.87 0.99  CALCIUM 9.0 9.2   Recent Labs    04/11/22 0458  LABPROT 14.6  INR 1.2      Component Value Date/Time   NA 140 04/11/2022 0458   K 4.1 04/11/2022 0458   CL 108 04/11/2022 0458   CO2 23 04/11/2022 0458   GLUCOSE 126 (H) 04/11/2022 0458   BUN 13 04/11/2022 0458   CREATININE 0.99 04/11/2022 0458   CALCIUM 9.2 04/11/2022 0458   PROT 6.4 (L) 04/11/2022 0458   ALBUMIN 3.3 (L) 04/11/2022 0458   AST 262 (H) 04/11/2022 0458   ALT 356 (H) 04/11/2022 0458   ALKPHOS 144 (H) 04/11/2022 0458   BILITOT 3.5 (H) 04/11/2022 0458   GFRNONAA >60 04/11/2022 0458   GFRAA >60 06/12/2019 0324    Anti-infectives: Anti-infectives (From admission, onward)    Start     Dose/Rate Route Frequency Ordered Stop   04/10/22 0900  ceFAZolin (ANCEF) IVPB 2g/100 mL premix        2 g 200 mL/hr over 30 Minutes Intravenous On call to O.R. 04/10/22 0813 04/10/22 1249   04/09/22 1800  ciprofloxacin (CIPRO) IVPB 400 mg  Status:  Discontinued        400 mg 200 mL/hr over 60 Minutes Intravenous Every 12 hours  04/09/22 1111 04/10/22 1613   04/09/22 1200  metroNIDAZOLE (FLAGYL) IVPB 500 mg  Status:  Discontinued        500 mg 100 mL/hr over 60 Minutes Intravenous Every 12 hours 04/09/22 1111 04/10/22 1613   04/09/22 0600  ciprofloxacin (CIPRO) IVPB 400 mg        400 mg 200 mL/hr over 60 Minutes Intravenous  Once 04/09/22 0558 04/09/22 0719       Assessment/Plan  s/p Procedure(s): LAPAROSCOPIC CHOLECYSTECTOMY WITH INTRAOPERATIVE CHOLANGIOGRAM 04/10/2022  Choledocholithiasis- -MRCP, +IOC, increased bilirubin today- plan for ERCP FEN - NPO for procedure, can have reg diet afterwards VTE - SCDs ID - no issues Disposition - inpatient for GI procedure   LOS: 2 days   I reviewed last 24 h vitals and pain scores, last 48 h intake and output, last 24 h labs and trends, and last 24 h imaging results.  This care required high  level of medical decision making.   [facility] 04/11/2022, 8:32 AM Please see Amion for pager number during day hours 7:00am-4:30pm or 7:00am -11:30am on weekends

## 2022-04-11 NOTE — Anesthesia Procedure Notes (Signed)
Procedure Name: Intubation Date/Time: 04/11/2022 1:16 PM  Performed by: Gean Maidens, CRNAPre-anesthesia Checklist: Patient identified, Emergency Drugs available, Suction available, Patient being monitored and Timeout performed Patient Re-evaluated:Patient Re-evaluated prior to induction Oxygen Delivery Method: Circle system utilized Preoxygenation: Pre-oxygenation with 100% oxygen Induction Type: IV induction Ventilation: Mask ventilation without difficulty Laryngoscope Size: Mac and 4 Grade View: Grade I Tube type: Oral Tube size: 7.0 mm Number of attempts: 1 Airway Equipment and Method: Stylet Placement Confirmation: ETT inserted through vocal cords under direct vision, positive ETCO2 and breath sounds checked- equal and bilateral Secured at: 21 cm Tube secured with: Tape Dental Injury: Teeth and Oropharynx as per pre-operative assessment

## 2022-04-11 NOTE — Op Note (Addendum)
[facility] Patient Name: Shannon Navarro Procedure Date: 04/11/2022 MRN: [id number] Attending MD: Jackquline Denmark , MD, [id number] Date of Birth: 12/14/1955 CSN: [id number] Age: 66 Admit Type: Outpatient Procedure:                ERCP Indications:              1. GERD with Esophageal dysphagia. 2. Abnormal IOC                            showing bile duct stones with abnormal LFTs. Providers:                Jackquline Denmark, MD, Benay Pillow, RN, Cletis Athens,                            Technician Referring MD:             Dr Silverio Decamp. Medicines:                General Anesthesia, Cipro 400 mg IV, Indomethacin                            100 mg PR, Glucagon 0.5 mg IV Complications:            No immediate complications. Estimated Blood Loss:     Estimated blood loss: none. Procedure:                Pre-Anesthesia Assessment:                           - Prior to the procedure, a History and Physical                            was performed, and patient medications and                            allergies were reviewed. The patient's tolerance of                            previous anesthesia was also reviewed. The risks                            and benefits of the procedure and the sedation                            options and risks were discussed with the patient.                            All questions were answered, and informed consent                            was obtained. Prior Anticoagulants: The patient has                            taken no anticoagulant or antiplatelet agents. ASA  Grade Assessment: II - A patient with mild systemic                            disease. After reviewing the risks and benefits,                            the patient was deemed in satisfactory condition to                            undergo the procedure.                           After obtaining informed consent, the scope was                             passed under direct vision. Throughout the                            procedure, the patient's blood pressure, pulse, and                            oxygen saturations were monitored continuously. The                            [id number]-Q190V ([id number]) Olympus duodenoscope was                            introduced through the mouth, and used to inject                            contrast into and used to inject contrast into the                            bile duct. The [id number]-H190 ([id number]) Olympus endoscope                            was introduced through the mouth, and used to                            inject contrast into. The ERCP was accomplished                            without difficulty. The patient tolerated the                            procedure well. Scope In: Scope Out: Findings:      EGD was performed first. Findings as follows.      Esophagus: Schatzki's ring was noted at GE junction, 38 cm from the       incisors, with luminal diameter of approximately 14 mm. 2 cm hiatal       hernia was noted. The Schatzki's ring was dilated using 15?16.5-18 mm       balloon to maximum of 18 mm. There was moderate mucosal disruption with  near complete improvement.      Stomach: 2 cm hiatal hernia. Otherwise normal.      Duodenum: Mild scalloping of duodenal mucosa. Multiple biopsies were       obtained from the first and the second portion of the duodenum to rule       out celiac disease after ERCP.      ERCP scope was inserted. A scout film of the abdomen was obtained.       Surgical clips, consistent with a previous cholecystectomy, were seen in       the area of the right upper quadrant of the abdomen. The esophagus was       successfully intubated under direct vision. The scope was advanced to a       normal major papilla in the descending duodenum without detailed       examination of the pharynx, larynx and associated structures, and upper       GI tract. The bile duct was  deeply cannulated with the short-nosed       traction sphincterotome using a wire-guided technique on first attempt.      CBD was dilated to 97m with 2 filling defects measuring 6 mm and 8 mm       in the distal CBD c/w choledocholithiasis. The right and the left       hepatic ducts were mildly dilated. The cystic duct remnant did fill.       There were no leaks. An 8 mm biliary sphincterotomy was made with a       monofilament traction (standard) sphincterotome using ERBE       electrocautery on endocut mode at 12 o'clock position tailored to the       stone. There was no post-sphincterotomy bleeding. A small stone came out       spontaneously. The biliary tree was swept with a 10-12 mm balloon       starting at the bifurcation several times. All stones were removed. Post       occlusion cholangiogram did not show any residual stones.      Pancreatic duct was intentionally not cannulated or injected. Impression:               -Schatzki's ring s/p esophageal dilatation.                           -Hiatal hernia.                           -Choledocholithiasis was found. Complete removal                            was accomplished by biliary sphincterotomy and                            balloon extraction.                           -Mild scalloping of duodenal mucosa. ?Importance.                            Biopsied to r/o celiac. Moderate Sedation:      Not Applicable - Patient had care per Anesthesia. Recommendation:           -  Return patient to hospital ward for ongoing care.                           - Clear liquid diet. Advance diet as tolerated by                            AM.                           - Continue Protonix 40 mg p.o. daily.                           - Trend CBC, LFTs                           - Watch for pancreatitis, bleeding, perforation,                            and cholangitis.                           - The findings and recommendations were discussed                             with the patient's daughter [place]. Procedure Code(s):        --- Professional ---                           [phone], Endoscopic retrograde                            cholangiopancreatography (ERCP); with removal of                            calculi/debris from biliary/pancreatic duct(s)                           43262, Endoscopic retrograde                            cholangiopancreatography (ERCP); with                            sphincterotomy/papillotomy                           43249, Esophagogastroduodenoscopy, flexible,                            transoral; with transendoscopic balloon dilation of                            esophagus (less than 30 mm diameter)                           [id number], Endoscopic catheterization of the biliary  ductal system, radiological supervision and                            interpretation Diagnosis Code(s):        --- Professional ---                           K80.50, Calculus of bile duct without cholangitis                            or cholecystitis without obstruction CPT copyright 2022 [employer]. All rights reserved. The codes documented in this report are preliminary and upon coder review may  be revised to meet current compliance requirements. Jackquline Denmark, MD 04/11/2022 2:20:41 PM This report has been signed electronically. Number of Addenda: 0

## 2022-04-11 NOTE — Transfer of Care (Signed)
Immediate Anesthesia Transfer of Care Note  Patient: Shannon Navarro  Procedure(s) Performed: ENDOSCOPIC RETROGRADE CHOLANGIOPANCREATOGRAPHY (ERCP) SPHINCTEROTOMY REMOVAL OF STONES BIOPSY ESOPHAGOGASTRODUODENOSCOPY (EGD) BALLOON DILATION  Patient Location: PACU  Anesthesia Type:General  Level of Consciousness: awake, alert , and oriented  Airway & Oxygen Therapy: Patient Spontanous Breathing and Patient connected to face mask oxygen  Post-op Assessment: Report given to RN and Post -op Vital signs reviewed and stable  Post vital signs: Reviewed and stable  Last Vitals:  Vitals Value Taken Time  BP    Temp    Pulse 44 04/11/22 1419  Resp 16 04/11/22 1419  SpO2 100 % 04/11/22 1419  Vitals shown include unvalidated device data.  Last Pain:  Vitals:   04/11/22 1226  TempSrc: Temporal  PainSc: 0-No pain      Patients Stated Pain Goal: 3 (06/11/41 8887)  Complications: No notable events documented.

## 2022-04-11 NOTE — Progress Notes (Signed)
POST ERCP NOTE:  Pt doing well. No abdominal pain or nausea or melena. Vitals stable. Abdominal Exam: soft, nontender.  Advance to full liq diet Low fat diet in AM. If tolerates, D/C home Rpt LFTs in 2-3 weeks. Can be done thru our office or in [place]. Discussed with family.  Carmell Austria

## 2022-04-11 NOTE — Anesthesia Postprocedure Evaluation (Signed)
Anesthesia Post Note  Patient: AIRLIE BLUMENBERG  Procedure(s) Performed: LAPAROSCOPIC CHOLECYSTECTOMY WITH INTRAOPERATIVE CHOLANGIOGRAM (Abdomen)     Patient location during evaluation: PACU Anesthesia Type: General Level of consciousness: awake and alert Pain management: pain level controlled Vital Signs Assessment: post-procedure vital signs reviewed and stable Respiratory status: spontaneous breathing, nonlabored ventilation, respiratory function stable and patient connected to nasal cannula oxygen Cardiovascular status: blood pressure returned to baseline and stable Postop Assessment: no apparent nausea or vomiting Anesthetic complications: no  No notable events documented.  Last Vitals:  Vitals:   04/11/22 0502 04/11/22 0739  BP: 126/64 133/73  Pulse:  (!) 42  Resp: 20 20  Temp: 36.7 C 36.6 C  SpO2: 99% 98%    Last Pain:  Vitals:   04/11/22 0930  TempSrc:   PainSc: 0-No pain                 Mallorey Odonell L Kylina Vultaggio

## 2022-04-12 DIAGNOSIS — K819 Cholecystitis, unspecified: Secondary | ICD-10-CM | POA: Diagnosis not present

## 2022-04-12 DIAGNOSIS — K810 Acute cholecystitis: Secondary | ICD-10-CM | POA: Diagnosis not present

## 2022-04-12 LAB — URINALYSIS, ROUTINE W REFLEX MICROSCOPIC
Bilirubin Urine: NEGATIVE
Glucose, UA: NEGATIVE mg/dL
Hgb urine dipstick: NEGATIVE
Ketones, ur: NEGATIVE mg/dL
Leukocytes,Ua: NEGATIVE
Nitrite: NEGATIVE
Protein, ur: NEGATIVE mg/dL
Specific Gravity, Urine: <1.005 — ABNORMAL LOW (ref 1.005–1.030)
pH: 6 (ref 5.0–8.0)

## 2022-04-12 LAB — COMPREHENSIVE METABOLIC PANEL
ALT: 340 U/L — ABNORMAL HIGH (ref 0–44)
AST: 200 U/L — ABNORMAL HIGH (ref 15–41)
Albumin: 3.1 g/dL — ABNORMAL LOW (ref 3.5–5.0)
Alkaline Phosphatase: 135 U/L — ABNORMAL HIGH (ref 38–126)
Anion gap: 7 (ref 5–15)
BUN: 18 mg/dL (ref 8–23)
CO2: 26 mmol/L (ref 22–32)
Calcium: 9.2 mg/dL (ref 8.9–10.3)
Chloride: 108 mmol/L (ref 98–111)
Creatinine, Ser: 1.57 mg/dL — ABNORMAL HIGH (ref 0.44–1.00)
GFR, Estimated: 36 mL/min — ABNORMAL LOW (ref >60)
Glucose, Bld: 123 mg/dL — ABNORMAL HIGH (ref 70–99)
Potassium: 3.8 mmol/L (ref 3.5–5.1)
Sodium: 141 mmol/L (ref 135–145)
Total Bilirubin: 3.1 mg/dL — ABNORMAL HIGH (ref 0.3–1.2)
Total Protein: 6.3 g/dL — ABNORMAL LOW (ref 6.5–8.1)

## 2022-04-12 LAB — GLUCOSE, CAPILLARY
Glucose-Capillary: 116 mg/dL — ABNORMAL HIGH (ref 70–99)
Glucose-Capillary: 118 mg/dL — ABNORMAL HIGH (ref 70–99)
Glucose-Capillary: 124 mg/dL — ABNORMAL HIGH (ref 70–99)
Glucose-Capillary: 144 mg/dL — ABNORMAL HIGH (ref 70–99)

## 2022-04-12 LAB — CBC
HCT: 36 % (ref 36.0–46.0)
Hemoglobin: 11.7 g/dL — ABNORMAL LOW (ref 12.0–15.0)
MCH: 28.6 pg (ref 26.0–34.0)
MCHC: 32.5 g/dL (ref 30.0–36.0)
MCV: 88 fL (ref 80.0–100.0)
Platelets: 215 10*3/uL (ref 150–400)
RBC: 4.09 MIL/uL (ref 3.87–5.11)
RDW: 13.2 % (ref 11.5–15.5)
WBC: 7.9 10*3/uL (ref 4.0–10.5)
nRBC: 0 % (ref 0.0–0.2)

## 2022-04-12 LAB — AMMONIA: Ammonia: <10 umol/L (ref 9–35)

## 2022-04-12 MED ORDER — POTASSIUM CHLORIDE IN NACL 20-0.9 MEQ/L-% IV SOLN
INTRAVENOUS | Status: DC
  Filled 2022-04-12: qty 1000

## 2022-04-12 NOTE — Progress Notes (Signed)
Mobility Specialist - Progress Note   04/12/22 1602  Mobility  Activity Ambulated with assistance in hallway  Level of Assistance Modified independent, requires aide device or extra time  Assistive Device  (IV Pole)  Distance Ambulated (ft) 500 ft  Activity Response Tolerated well  Mobility Referral Yes  $Mobility charge 1 Mobility   Pt received in bathroom and agreeable to mobility. Pt a little dizzy throughout ambulation but stated she felt fine. No complaints during session. Pt to recliner after session with all needs met & daughters in room.   [facility]

## 2022-04-12 NOTE — Progress Notes (Signed)
Patient voided 250 cc clear yellow urine.  Post-void bladder scan revealed 0 mL urine.  Angie Fava, RN

## 2022-04-12 NOTE — Progress Notes (Signed)
Progress Note   Patient: Shannon Navarro NLG:[id number] DOB: 01-17-1956 DOA: 04/09/2022     3 DOS: the patient was seen and examined on 04/12/2022   Brief hospital course: 66 y.o. female with medical history significant of allergy rhinitis, anxiety, paroxysmal atrial fibrillation, endometrial polyp, fatty liver disease, GERD, herniated cervical disc, history of cervical spine fusion, esophageal stricture, hypertension, impaired fasting glucose, migraine headaches, class II obesity, OSA on CPAP, pseudotumor cerebri, vitamin D deficiency was coming to the emergency department due to on and off epigastric/RUQ abdominal pain for the past few weeks.  She was seen in the emergency department on 03/25/2022 due to syncopal episode in the setting of bradycardia in the 30s and hypotension while at the orthopedic surgeon's office where she was treated with 500 mL of normal saline and a milligram of atropine by EMS.  Symptoms have resolved by the time that she arrived to the emergency department.  She was followed by Dr. [name] next day who performed a 3-day cardiac monitor on her.  She returned early morning today after having the worst episode of pain associated with multiple episodes of emesis.  And she has been bradycardic.  No fever, chills or night sweats. No sore throat, rhinorrhea, dyspnea, wheezing or hemoptysis.  No chest pain, palpitations, diaphoresis, PND, orthopnea or pitting edema of the lower extremities.  No diarrhea, constipation, melena or hematochezia.  No flank pain, dysuria, frequency or hematuria, but stated her urine has been darker.  No polyuria, polydipsia, polyphagia or blurred vision.   Assessment and Plan: Principal Problem:   Elevated LFTs Due to:   Acute cholecystitis Associated with:   Cholelithiasis -General Surgery Consulted -MRCP with evidence of acute cholecystitis, mild extrahepatic biliary dilation without choledocholithiasis -Pt now s/p lap chole 11/30 -GI consulted,  now s/p ERCP 12/1 with findings of choledocholithiasis s/p removal with biliary sphincterotomy and balloon extraction -now tolerating regular diet   Active Problems:   Anxiety -continue with analgesia as needed     GERD -Cont on famotidine IVP twice daily.     Prediabetes Last hemoglobin A1c 6.0% on 12/26/2021. -cont on SSI as needed -glycemic trends stable     Essential hypertension Had been continued on parenteral antihypertensives as needed while NPO BP suboptimally controlled. Continued home losartan     OSA (obstructive sleep apnea) Continue CPAP at bedtime.     PAF (paroxysmal atrial fibrillation) (HCC) CHA2DS2-VASc Score of at least 4. Off anticoagulation. On propranolol as needed.  ARF -Cr up to 1.57, up from 0.99 -UA reviewed, no protein -Bladder scan neg -Possible contrast nephropathy from recent contrast load -cont IVF -recheck bmet in AM      Subjective: Reports feeling better today. Eager to go home soon  Physical Exam: Vitals:   04/12/22 0444 04/12/22 0923 04/12/22 1242 04/12/22 1647  BP: (!) 126/59 (!) 123/57 (!) 135/59 (!) 111/55  Pulse: (!) 46 (!) 53 (!) 47 (!) 47  Resp: '20 18 16 16  '$ Temp: 97.8 F (36.6 C) 98 F (36.7 C) (!) 97.3 F (36.3 C) 97.9 F (36.6 C)  TempSrc: Oral Oral Oral Oral  SpO2: 100% 99% 100% 99%  Weight:      Height:       General exam: Awake, laying in bed, in nad Respiratory system: Normal respiratory effort, no wheezing Cardiovascular system: regular rate, s1, s2 Gastrointestinal system: Soft, nondistended, positive BS Central nervous system: CN2-12 grossly intact, strength intact Extremities: Perfused, no clubbing Skin: Normal skin turgor, no notable skin lesions  seen Psychiatry: Mood normal // no visual hallucinations   Data Reviewed:  Labs reviewed: Na 141, K 3.8, Cr 1.57  Family Communication: Pt in room, family at bedside  Disposition: Status is: Inpatient Remains inpatient appropriate because: Severity  of illness  Planned Discharge Destination: Home    Author: Marylu Lund, MD 04/12/2022 5:53 PM  For on call review [email or web address].

## 2022-04-12 NOTE — Progress Notes (Signed)
Patient having moments of disorientation and intermittent confusion. No further neurological symptoms. Hospitalist notified, no new orders.

## 2022-04-12 NOTE — Progress Notes (Signed)
  Transition of Care [facility]) Screening Note   Patient Details  Name: NAESHA BUCKALEW Date of Birth: [birth year]   Transition of Care [facility]) CM/SW Contact:    Vassie Moselle, LCSW Phone Number: 04/12/2022, 9:08 AM    Transition of Care Department [facility]) has reviewed patient and no TOC needs have been identified at this time. We will continue to monitor patient advancement through interdisciplinary progression rounds. If new patient transition needs arise, please place a TOC consult.

## 2022-04-12 NOTE — Progress Notes (Signed)
1 Day Post-Op   Subjective/Chief Complaint: Had some confusion after midnight but now awake and alert Reports minimal pain No N/V Urine still dark   Objective: Vital signs in last 24 hours: Temp:  [97.3 F (36.3 C)-98.1 F (36.7 C)] 97.8 F (36.6 C) (12/02 0444) Pulse Rate:  [40-55] 46 (12/02 0444) Resp:  [15-20] 20 (12/02 0444) BP: (116-147)/(39-64) 126/59 (12/02 0444) SpO2:  [85 %-100 %] 100 % (12/02 0444) FiO2 (%):  [21 %] 21 % (12/02 0444) Last BM Date : 05/08/22  Intake/Output from previous day: 12/01 0701 - 12/02 0700 In: 2293.8 [P.O.:840; I.V.:1453.8] Out: -  Intake/Output this shift: No intake/output data recorded.  Exam: Awake and alert Abdomen soft, minimal tenderness, incisions clean  Lab Results:  Recent Labs    04/11/22 0458 04/12/22 0527  WBC 8.6 7.9  HGB 12.6 11.7*  HCT 38.3 36.0  PLT 215 215   BMET Recent Labs    04/11/22 0458 04/12/22 0527  NA 140 141  K 4.1 3.8  CL 108 108  CO2 23 26  GLUCOSE 126* 123*  BUN 13 18  CREATININE 0.99 1.57*  CALCIUM 9.2 9.2   PT/INR Recent Labs    04/11/22 0458  LABPROT 14.6  INR 1.2   ABG No results for input(s): "PHART", "HCO3" in the last 72 hours.  Invalid input(s): "PCO2", "PO2"  Studies/Results: DG ERCP  Result Date: 04/11/2022 CLINICAL DATA:  Abnormal intraoperative cholangiogram. EXAM: ERCP TECHNIQUE: Multiple spot images obtained with the fluoroscopic device and submitted for interpretation post-procedure. FLUOROSCOPY: Radiation Exposure Index (as provided by the fluoroscopic device): 7.32 mGy Kerma COMPARISON:  Intraoperative cholangiogram 04/10/2022 FINDINGS: Retrograde cholangiogram demonstrates subtle filling defects in the common bile duct and these are most compatible with stones. Evidence for a balloon sweep for stone removal. IMPRESSION: Choledocholithiasis and stone removal. These images were submitted for radiologic interpretation only. Please see the procedural report for the  amount of contrast and the fluoroscopy time utilized. Electronically Signed   By: Markus Daft M.D.   On: 04/11/2022 16:08   DG Cholangiogram Operative  Result Date: 04/10/2022 CLINICAL DATA:  66 year old female with history of cholelithiasis. EXAM: INTRAOPERATIVE CHOLANGIOGRAM TECHNIQUE: Cholangiographic images from the C-arm fluoroscopic device were submitted for interpretation post-operatively. Please see the procedural report for the amount of contrast and the fluoroscopy time utilized. COMPARISON:  MRCP from 04/10/2019 FINDINGS: Intraoperative antegrade injection via the cystic duct which opacifies the common bile duct and central portions of the intrahepatic biliary tree. Contrast is not visualized flowing freely into the duodenum. There are no definite filling defects. Mild intra and extrahepatic biliary ductal dilation. No apparent anomalous anatomical configuration of the biliary tree. IMPRESSION: There is no drainage of contrast in the common bile duct into the duodenum suggestive of distal common bile duct obstruction, however and there are no definite filling defects visualized in the distal common bile duct. Consider ERCP for further characterization. Ruthann Cancer, MD [facility] Electronically Signed   By: Ruthann Cancer M.D.   On: 04/10/2022 14:21    Anti-infectives: Anti-infectives (From admission, onward)    Start     Dose/Rate Route Frequency Ordered Stop   04/10/22 0900  ceFAZolin (ANCEF) IVPB 2g/100 mL premix        2 g 200 mL/hr over 30 Minutes Intravenous On call to O.R. 04/10/22 0813 04/10/22 1249   04/09/22 1800  ciprofloxacin (CIPRO) IVPB 400 mg  Status:  Discontinued  400 mg 200 mL/hr over 60 Minutes Intravenous Every 12 hours 04/09/22 1111 04/10/22 1613   04/09/22 1200  metroNIDAZOLE (FLAGYL) IVPB 500 mg  Status:  Discontinued        500 mg 100 mL/hr over 60 Minutes Intravenous Every 12 hours 04/09/22 1111  04/10/22 1613   04/09/22 0600  ciprofloxacin (CIPRO) IVPB 400 mg        400 mg 200 mL/hr over 60 Minutes Intravenous  Once 04/09/22 0558 04/09/22 0719       Assessment/Plan: S/p lap chole with IOC with +cholangiogram S/p postop ERCP  Cr. Up.   Continue IV rehydration T.bili 3.1 Likely home tomorrow given labs  LOS: 3 days    Coralie Keens MD 04/12/2022

## 2022-04-13 DIAGNOSIS — K810 Acute cholecystitis: Secondary | ICD-10-CM | POA: Diagnosis not present

## 2022-04-13 DIAGNOSIS — K819 Cholecystitis, unspecified: Secondary | ICD-10-CM | POA: Diagnosis not present

## 2022-04-13 LAB — COMPREHENSIVE METABOLIC PANEL
ALT: 304 U/L — ABNORMAL HIGH (ref 0–44)
AST: 150 U/L — ABNORMAL HIGH (ref 15–41)
Albumin: 2.9 g/dL — ABNORMAL LOW (ref 3.5–5.0)
Alkaline Phosphatase: 128 U/L — ABNORMAL HIGH (ref 38–126)
Anion gap: 5 (ref 5–15)
BUN: 20 mg/dL (ref 8–23)
CO2: 26 mmol/L (ref 22–32)
Calcium: 9.1 mg/dL (ref 8.9–10.3)
Chloride: 112 mmol/L — ABNORMAL HIGH (ref 98–111)
Creatinine, Ser: 1.41 mg/dL — ABNORMAL HIGH (ref 0.44–1.00)
GFR, Estimated: 41 mL/min — ABNORMAL LOW (ref >60)
Glucose, Bld: 99 mg/dL (ref 70–99)
Potassium: 4 mmol/L (ref 3.5–5.1)
Sodium: 143 mmol/L (ref 135–145)
Total Bilirubin: 2.1 mg/dL — ABNORMAL HIGH (ref 0.3–1.2)
Total Protein: 5.6 g/dL — ABNORMAL LOW (ref 6.5–8.1)

## 2022-04-13 LAB — CBC
HCT: 35.9 % — ABNORMAL LOW (ref 36.0–46.0)
Hemoglobin: 11.5 g/dL — ABNORMAL LOW (ref 12.0–15.0)
MCH: 28.8 pg (ref 26.0–34.0)
MCHC: 32 g/dL (ref 30.0–36.0)
MCV: 89.8 fL (ref 80.0–100.0)
Platelets: 186 10*3/uL (ref 150–400)
RBC: 4 MIL/uL (ref 3.87–5.11)
RDW: 13.4 % (ref 11.5–15.5)
WBC: 5.6 10*3/uL (ref 4.0–10.5)
nRBC: 0 % (ref 0.0–0.2)

## 2022-04-13 LAB — GLUCOSE, CAPILLARY
Glucose-Capillary: 98 mg/dL (ref 70–99)
Glucose-Capillary: 104 mg/dL — ABNORMAL HIGH (ref 70–99)
Glucose-Capillary: 103 mg/dL — ABNORMAL HIGH (ref 70–99)

## 2022-04-13 MED ORDER — POLYETHYLENE GLYCOL 3350 17 G PO PACK
17.0000 g | PACK | Freq: Every day | ORAL | Status: DC
  Administered 2022-04-13: 17 g via ORAL
  Filled 2022-04-13: qty 1

## 2022-04-13 MED ORDER — DOCUSATE SODIUM 100 MG PO CAPS: 100.0000 mg | ORAL_CAPSULE | Freq: Two times a day (BID) | ORAL | 0 refills | Status: AC

## 2022-04-13 MED ORDER — OXYCODONE HCL 5 MG PO TABS: 5.0000 mg | ORAL_TABLET | Freq: Four times a day (QID) | ORAL | 0 refills | Status: DC | PRN

## 2022-04-13 MED ORDER — POLYETHYLENE GLYCOL 3350 17 G PO PACK: 17.0000 g | PACK | Freq: Every day | ORAL | 0 refills | Status: DC

## 2022-04-13 MED ORDER — AMLODIPINE BESYLATE 2.5 MG PO TABS: 2.5000 mg | ORAL_TABLET | Freq: Every day | ORAL | 0 refills | Status: DC

## 2022-04-13 MED ORDER — ONDANSETRON 4 MG PO TBDP: 4.0000 mg | ORAL_TABLET | Freq: Three times a day (TID) | ORAL | 0 refills | Status: DC | PRN

## 2022-04-13 NOTE — Progress Notes (Signed)
2 Days Post-Op   Subjective/Chief Complaint: Feels much better today Wants to go home   Objective: Vital signs in last 24 hours: Temp:  [97.3 F (36.3 C)-98 F (36.7 C)] 97.9 F (36.6 C) (12/03 0538) Pulse Rate:  [40-53] 40 (12/03 0538) Resp:  [16-21] 21 (12/03 0538) BP: (111-142)/(55-62) 142/62 (12/03 0538) SpO2:  [97 %-100 %] 97 % (12/03 0538) Last BM Date : 04/08/22  Intake/Output from previous day: 12/02 0701 - 12/03 0700 In: 2519.7 [P.O.:480; I.V.:2039.7] Out: 2350 [Urine:2350] Intake/Output this shift: No intake/output data recorded.  Exam: Awake and alert Abdomen soft, minimally tender  Lab Results:  Recent Labs    04/12/22 0527 04/13/22 0437  WBC 7.9 5.6  HGB 11.7* 11.5*  HCT 36.0 35.9*  PLT 215 186   BMET Recent Labs    04/12/22 0527 04/13/22 0437  NA 141 143  K 3.8 4.0  CL 108 112*  CO2 26 26  GLUCOSE 123* 99  BUN 18 20  CREATININE 1.57* 1.41*  CALCIUM 9.2 9.1   PT/INR Recent Labs    04/11/22 0458  LABPROT 14.6  INR 1.2   ABG No results for input(s): "PHART", "HCO3" in the last 72 hours.  Invalid input(s): "PCO2", "PO2"  Studies/Results: DG ERCP  Result Date: 04/11/2022 CLINICAL DATA:  Abnormal intraoperative cholangiogram. EXAM: ERCP TECHNIQUE: Multiple spot images obtained with the fluoroscopic device and submitted for interpretation post-procedure. FLUOROSCOPY: Radiation Exposure Index (as provided by the fluoroscopic device): 7.32 mGy Kerma COMPARISON:  Intraoperative cholangiogram 04/10/2022 FINDINGS: Retrograde cholangiogram demonstrates subtle filling defects in the common bile duct and these are most compatible with stones. Evidence for a balloon sweep for stone removal. IMPRESSION: Choledocholithiasis and stone removal. These images were submitted for radiologic interpretation only. Please see the procedural report for the amount of contrast and the fluoroscopy time utilized. Electronically Signed   By: Markus Daft M.D.   On:  04/11/2022 16:08    Anti-infectives: Anti-infectives (From admission, onward)    Start     Dose/Rate Route Frequency Ordered Stop   04/10/22 0900  ceFAZolin (ANCEF) IVPB 2g/100 mL premix        2 g 200 mL/hr over 30 Minutes Intravenous On call to O.R. 04/10/22 0813 04/10/22 1249   04/09/22 1800  ciprofloxacin (CIPRO) IVPB 400 mg  Status:  Discontinued        400 mg 200 mL/hr over 60 Minutes Intravenous Every 12 hours 04/09/22 1111 04/10/22 1613   04/09/22 1200  metroNIDAZOLE (FLAGYL) IVPB 500 mg  Status:  Discontinued        500 mg 100 mL/hr over 60 Minutes Intravenous Every 12 hours 04/09/22 1111 04/10/22 1613   04/09/22 0600  ciprofloxacin (CIPRO) IVPB 400 mg        400 mg 200 mL/hr over 60 Minutes Intravenous  Once 04/09/22 0558 04/09/22 0719       Assessment/Plan: S/p lap chole with IOC with +cholangiogram S/p postop ERC  Creatinine down Ok for discharge from a surgical standpoint today   LOS: 4 days    Coralie Keens MD 04/13/2022

## 2022-04-13 NOTE — Discharge Summary (Signed)
Physician Discharge Summary   Patient: Shannon Navarro MRN: [id number] DOB: Oct 30, 1955  Admit date:     04/09/2022  Discharge date: 04/13/22  Discharge Physician: Marylu Lund   PCP: [name], MD   Recommendations at discharge:   Follow up with PCP in 1-2 weeks Recommend titrating bp meds as needed. Follow up with General Surgery as scheduled   Discharge Diagnoses: Principal Problem:   Acute cholecystitis Active Problems:   Anxiety   GERD   Prediabetes   Essential hypertension   OSA (obstructive sleep apnea)   Sinus bradycardia   PAF (paroxysmal atrial fibrillation) (HCC)   Elevated LFTs  Resolved Problems:   * No resolved hospital problems. *  Hospital Course: 66 y.o. female with medical history significant of allergy rhinitis, anxiety, paroxysmal atrial fibrillation, endometrial polyp, fatty liver disease, GERD, herniated cervical disc, history of cervical spine fusion, esophageal stricture, hypertension, impaired fasting glucose, migraine headaches, class II obesity, OSA on CPAP, pseudotumor cerebri, vitamin D deficiency was coming to the emergency department due to on and off epigastric/RUQ abdominal pain for the past few weeks.  She was seen in the emergency department on 03/25/2022 due to syncopal episode in the setting of bradycardia in the 30s and hypotension while at the orthopedic surgeon's office where she was treated with 500 mL of normal saline and a milligram of atropine by EMS.  Symptoms have resolved by the time that she arrived to the emergency department.  She was followed by Dr. [name] next day who performed a 3-day cardiac monitor on her.  She returned early morning today after having the worst episode of pain associated with multiple episodes of emesis.  And she has been bradycardic.  No fever, chills or night sweats. No sore throat, rhinorrhea, dyspnea, wheezing or hemoptysis.  No chest pain, palpitations, diaphoresis, PND, orthopnea or pitting  edema of the lower extremities.  No diarrhea, constipation, melena or hematochezia.  No flank pain, dysuria, frequency or hematuria, but stated her urine has been darker.  No polyuria, polydipsia, polyphagia or blurred vision.   Assessment and Plan: Principal Problem:   Elevated LFTs Due to:   Acute cholecystitis Associated with:   Cholelithiasis -General Surgery Consulted -MRCP with evidence of acute cholecystitis, mild extrahepatic biliary dilation without choledocholithiasis -Pt now s/p lap chole 11/30 -GI consulted, now s/p ERCP 12/1 with findings of choledocholithiasis s/p removal with biliary sphincterotomy and balloon extraction -tolerated regular   Active Problems:   Anxiety -continue with analgesia as needed     GERD -Cont on famotidine IVP twice daily.     Prediabetes Last hemoglobin A1c 6.0% on 12/26/2021. -cont on SSI as needed while in hospital -glycemic trends stable     Essential hypertension Had been continued on parenteral antihypertensives as needed while NPO BP suboptimally controlled.  Losartan held secondary to below ARF. Instead, pt prescribed norvasc 2.'5mg'$  qd on d/c     OSA (obstructive sleep apnea) Continue CPAP at bedtime.     PAF (paroxysmal atrial fibrillation) (HCC) CHA2DS2-VASc Score of at least 4. Off anticoagulation. On propranolol as needed.   ARF -Cr up to 1.57, up from 0.99 -UA reviewed, no protein -Bladder scan neg -Possible contrast nephropathy from recent contrast load -Cr improved to 1.41 with fluids. Pt voiding well      Consultants: General Surgery, GI Procedures performed: Lap chole, ERCP  Disposition: Home Diet recommendation:  Low fat DISCHARGE MEDICATION: Allergies as of 04/13/2022       Reactions   Cefaclor  Rash   Codeine Nausea And Vomiting   Hydrocodone Nausea And Vomiting   Severe vomiting/  COUGH SYRUP CAUSE SEVERE VOMITING   Levaquin [levofloxacin] Nausea Only   Nexium [esomeprazole Magnesium] Other (See  Comments)   Made legs ache   Penicillins Rash   Sulfa Antibiotics Rash        Medication List     STOP taking these medications    doxycycline 100 MG tablet Commonly known as: VIBRA-TABS   HYDROcodone-acetaminophen 7.5-325 mg/15 ml solution Commonly known as: HYCET   losartan 100 MG tablet Commonly known as: COZAAR   propranolol 20 MG tablet Commonly known as: INDERAL   venlafaxine XR 75 MG 24 hr capsule Commonly known as: Effexor XR       TAKE these medications    amLODipine 2.5 MG tablet Commonly known as: NORVASC Take 1 tablet (2.5 mg total) by mouth daily.   cetirizine 10 MG tablet Commonly known as: ZYRTEC Take 10 mg by mouth daily.   docusate sodium 100 MG capsule Commonly known as: Colace Take 1 capsule (100 mg total) by mouth 2 (two) times daily.   FDgard 25-20.75 MG Caps Generic drug: Caraway Oil-Levomenthol Use as directed What changed:  how much to take how to take this when to take this additional instructions   fluticasone 50 MCG/ACT nasal spray Commonly known as: FLONASE Place 2 sprays into both nostrils daily. What changed:  when to take this reasons to take this   ondansetron 4 MG disintegrating tablet Commonly known as: ZOFRAN-ODT Take 1 tablet (4 mg total) by mouth every 8 (eight) hours as needed for nausea or vomiting.   oxyCODONE 5 MG immediate release tablet Commonly known as: Oxy IR/ROXICODONE Take 1 tablet (5 mg total) by mouth every 6 (six) hours as needed for severe pain.   pantoprazole 40 MG tablet Commonly known as: PROTONIX TAKE 1 TABLET BY MOUTH TWICE A DAY BEFORE A MEAL What changed: See the new instructions.   polyethylene glycol 17 g packet Commonly known as: MIRALAX / GLYCOLAX Take 17 g by mouth daily. Start taking on: April 14, 2022   Vitamin D 125 MCG (5000 UT) Caps Take 5,000 Units by mouth daily.        Follow-up Information     Maczis, Carlena Hurl, [place]. Go on 04/29/2022.   Specialty:  General Surgery Why: 04/29/22 at 11:45 am. Please bring a copy of your photo ID and insurance card. Please arrive 30 minutes prior to your appointment for paperwork. Contact information: [address] [place] [address] [phone]         [name], MD Follow up in 1 week(s).   Specialty: Internal Medicine Why: Hospital follow up Contact information: [place] [address] [phone]                Discharge Exam: Danley Danker Weights   04/09/22 2127 04/10/22 1027  Weight: 98.1 kg 98.1 kg   General exam: Awake, laying in bed, in nad Respiratory system: Normal respiratory effort, no wheezing Cardiovascular system: regular rate, s1, s2 Gastrointestinal system: Soft, nondistended, positive BS Central nervous system: CN2-12 grossly intact, strength intact Extremities: Perfused, no clubbing Skin: Normal skin turgor, no notable skin lesions seen Psychiatry: Mood normal // no visual hallucinations   Condition at discharge: fair  The results of significant diagnostics from this hospitalization (including imaging, microbiology, ancillary and laboratory) are listed below for reference.   Imaging Studies: LONG TERM MONITOR (3-14  DAYS)  Result Date: 04/12/2022 Normal sinus rhythm Infrequent runs of SVT with the longest rn being 13 beats No sustained arrhythmias.  DG ERCP  Result Date: 04/11/2022 CLINICAL DATA:  Abnormal intraoperative cholangiogram. EXAM: ERCP TECHNIQUE: Multiple spot images obtained with the fluoroscopic device and submitted for interpretation post-procedure. FLUOROSCOPY: Radiation Exposure Index (as provided by the fluoroscopic device): 7.32 mGy Kerma COMPARISON:  Intraoperative cholangiogram 04/10/2022 FINDINGS: Retrograde cholangiogram demonstrates subtle filling defects in the common bile duct and these are most compatible with stones. Evidence for a balloon sweep for stone removal. IMPRESSION: Choledocholithiasis  and stone removal. These images were submitted for radiologic interpretation only. Please see the procedural report for the amount of contrast and the fluoroscopy time utilized. Electronically Signed   By: Markus Daft M.D.   On: 04/11/2022 16:08   DG Cholangiogram Operative  Result Date: 04/10/2022 CLINICAL DATA:  66 year old female with history of cholelithiasis. EXAM: INTRAOPERATIVE CHOLANGIOGRAM TECHNIQUE: Cholangiographic images from the C-arm fluoroscopic device were submitted for interpretation post-operatively. Please see the procedural report for the amount of contrast and the fluoroscopy time utilized. COMPARISON:  MRCP from 04/10/2019 FINDINGS: Intraoperative antegrade injection via the cystic duct which opacifies the common bile duct and central portions of the intrahepatic biliary tree. Contrast is not visualized flowing freely into the duodenum. There are no definite filling defects. Mild intra and extrahepatic biliary ductal dilation. No apparent anomalous anatomical configuration of the biliary tree. IMPRESSION: There is no drainage of contrast in the common bile duct into the duodenum suggestive of distal common bile duct obstruction, however and there are no definite filling defects visualized in the distal common bile duct. Consider ERCP for further characterization. Ruthann Cancer, MD [facility] Radiology Electronically Signed   By: Ruthann Cancer M.D.   On: 04/10/2022 14:21   ECHOCARDIOGRAM COMPLETE  Result Date: 04/09/2022    ECHOCARDIOGRAM REPORT   Patient Name:   BINTOU LAFATA Date of Exam: 04/09/2022 Medical Rec #:  [id number]         Height:       66.0 in Accession #:    [id number]        Weight:       226.8 lb Date of Birth:  [birth year]         BSA:          2.110 m Patient Age:    20 years          BP:           122/39 mmHg Patient Gender: F                 HR:           46 bpm. Exam Location:  Inpatient Procedure: 2D Echo, Color  Doppler and Cardiac Doppler Indications:    Pre-op clearance  History:        Patient has prior history of Echocardiogram examinations, most                 recent 09/08/2019. Arrythmias:Atrial Fibrillation and                 Bradycardia; Risk Factors:Sleep Apnea and Hypertension.  Sonographer:    Eartha Inch Referring Phys: [phone] HAO MENG  Sonographer Comments: Suboptimal parasternal window. Image acquisition challenging due to patient body habitus. IMPRESSIONS  1. Left ventricular ejection fraction, by estimation, is 60 to 65%. The left ventricle has normal function. The left ventricle has no regional wall motion  abnormalities. Left ventricular diastolic parameters are indeterminate.  2. Right ventricular systolic function is normal. The right ventricular size is normal. There is normal pulmonary artery systolic pressure.  3. No evidence of mitral valve regurgitation.  4. Aortic valve regurgitation is not visualized.  5. The inferior vena cava is normal in size with greater than 50% respiratory variability, suggesting right atrial pressure of 3 mmHg. Comparison(s): No significant change from prior study. FINDINGS  Left Ventricle: Left ventricular ejection fraction, by estimation, is 60 to 65%. The left ventricle has normal function. The left ventricle has no regional wall motion abnormalities. The left ventricular internal cavity size was normal in size. Left ventricular diastolic parameters are indeterminate. Right Ventricle: The right ventricular size is normal. Right ventricular systolic function is normal. There is normal pulmonary artery systolic pressure. The tricuspid regurgitant velocity is 2.38 m/s, and with an assumed right atrial pressure of 3 mmHg,  the estimated right ventricular systolic pressure is 40.9 mmHg. Left Atrium: Left atrial size was normal in size. Right Atrium: Right atrial size was normal in size. Pericardium: There is no evidence of pericardial effusion. Mitral Valve: No evidence  of mitral valve regurgitation. MV peak gradient, 4.2 mmHg. The mean mitral valve gradient is 1.0 mmHg. Tricuspid Valve: Tricuspid valve regurgitation is trivial. Aortic Valve: Aortic valve regurgitation is not visualized. Aortic valve mean gradient measures 9.0 mmHg. Aortic valve peak gradient measures 15.2 mmHg. Aortic valve area, by VTI measures 2.59 cm. Pulmonic Valve: Pulmonic valve regurgitation is trivial. Aorta: The aortic root and ascending aorta are structurally normal, with no evidence of dilitation. Venous: The inferior vena cava is normal in size with greater than 50% respiratory variability, suggesting right atrial pressure of 3 mmHg. IAS/Shunts: No atrial level shunt detected by color flow Doppler.  LEFT VENTRICLE PLAX 2D LVIDd:         4.90 cm   Diastology LVIDs:         3.20 cm   LV e' medial:    5.44 cm/s LV PW:         0.90 cm   LV E/e' medial:  18.4 LV IVS:        0.60 cm   LV e' lateral:   8.81 cm/s LVOT diam:     2.00 cm   LV E/e' lateral: 11.3 LV SV:         129 LV SV Index:   61 LVOT Area:     3.14 cm  RIGHT VENTRICLE             IVC RV S prime:     12.70 cm/s  IVC diam: 2.20 cm TAPSE (M-mode): 2.7 cm LEFT ATRIUM             Index        RIGHT ATRIUM           Index LA diam:        3.30 cm 1.56 cm/m   RA Area:     16.10 cm LA Vol (A2C):   51.1 ml 24.22 ml/m  RA Volume:   36.80 ml  17.44 ml/m LA Vol (A4C):   44.8 ml 21.24 ml/m LA Biplane Vol: 47.7 ml 22.61 ml/m  AORTIC VALVE                     PULMONIC VALVE AV Area (Vmax):    2.59 cm      PR End Diast Vel: 1.84 msec AV Area (Vmean):  2.46 cm AV Area (VTI):     2.59 cm AV Vmax:           195.00 cm/s AV Vmean:          139.000 cm/s AV VTI:            0.500 m AV Peak Grad:      15.2 mmHg AV Mean Grad:      9.0 mmHg LVOT Vmax:         161.00 cm/s LVOT Vmean:        109.000 cm/s LVOT VTI:          0.412 m LVOT/AV VTI ratio: 0.82  AORTA Ao Root diam: 2.60 cm Ao Asc diam:  3.40 cm MITRAL VALVE               TRICUSPID VALVE MV Area (PHT):  3.72 cm    TR Peak grad:   22.7 mmHg MV Area VTI:   2.76 cm    TR Mean grad:   16.0 mmHg MV Peak grad:  4.2 mmHg    TR Vmax:        238.00 cm/s MV Mean grad:  1.0 mmHg    TR Vmean:       194.0 cm/s MV Vmax:       1.02 m/s MV Vmean:      48.6 cm/s   SHUNTS MV Decel Time: 204 msec    Systemic VTI:  0.41 m MV E velocity: 99.90 cm/s  Systemic Diam: 2.00 cm MV A velocity: 81.00 cm/s MV E/A ratio:  1.23 Mary Scientist, physiological signed by Phineas Inches Signature Date/Time: 04/09/2022/2:05:58 PM    Final    MR ABDOMEN MRCP W WO CONTAST  Result Date: 04/09/2022 CLINICAL DATA:  Upper abdominal pain with nausea and vomiting. Cholelithiasis, gallbladder distension and pericholecystic fluid on ultrasound. EXAM: MRI ABDOMEN WITHOUT AND WITH CONTRAST (INCLUDING MRCP) TECHNIQUE: Multiplanar multisequence MR imaging of the abdomen was performed both before and after the administration of intravenous contrast. Heavily T2-weighted images of the biliary and pancreatic ducts were obtained, and three-dimensional MRCP images were rendered by post processing. CONTRAST:  56m GADAVIST GADOBUTROL 1 MMOL/ML IV SOLN COMPARISON:  Abdominal ultrasound 04/09/2022 and 03/25/2022. Abdominal CT 10/07/2012. FINDINGS: Lower chest: There is a moderate size hiatal hernia. The visualized lower chest otherwise appears unremarkable. Hepatobiliary: No evidence of steatosis, cirrhosis or focal liver lesion. As seen on ultrasound, there are several gallstones. There is mild pericholecystic fluid. There is mild extrahepatic biliary dilatation with the common hepatic duct measuring up to 8 mm in diameter. The duct tapers distally. No evidence of choledocholithiasis. No intrahepatic biliary dilatation. Pancreas: Unremarkable. No pancreatic ductal dilatation or surrounding inflammatory changes. Spleen: Normal in size without focal abnormality. Adrenals/Urinary Tract: Both adrenal glands appear normal. No evidence of hydronephrosis or suspicious renal  lesion. Bladder not imaged. Stomach/Bowel: Moderate stool throughout the colon. No evidence of bowel distension or inflammation. Vascular/Lymphatic: There are no enlarged abdominal lymph nodes. No significant vascular findings. Other: No evidence of abdominal wall hernia or ascites. Musculoskeletal: No acute or significant osseous findings. IMPRESSION: 1. Cholelithiasis with mild pericholecystic fluid, suspicious for acute cholecystitis. A sonographic MPercell Millersign was not reported on earlier ultrasound. Correlate clinically. 2. Mild extrahepatic biliary dilatation without evidence of choledocholithiasis. 3. No other significant abdominal findings. 4. Moderate size hiatal hernia. Electronically Signed   By: WRichardean SaleM.D.   On: 04/09/2022 09:52   MR 3D Recon At Scanner  Result Date: 04/09/2022 CLINICAL  DATA:  Upper abdominal pain with nausea and vomiting. Cholelithiasis, gallbladder distension and pericholecystic fluid on ultrasound. EXAM: MRI ABDOMEN WITHOUT AND WITH CONTRAST (INCLUDING MRCP) TECHNIQUE: Multiplanar multisequence MR imaging of the abdomen was performed both before and after the administration of intravenous contrast. Heavily T2-weighted images of the biliary and pancreatic ducts were obtained, and three-dimensional MRCP images were rendered by post processing. CONTRAST:  38m GADAVIST GADOBUTROL 1 MMOL/ML IV SOLN COMPARISON:  Abdominal ultrasound 04/09/2022 and 03/25/2022. Abdominal CT 10/07/2012. FINDINGS: Lower chest: There is a moderate size hiatal hernia. The visualized lower chest otherwise appears unremarkable. Hepatobiliary: No evidence of steatosis, cirrhosis or focal liver lesion. As seen on ultrasound, there are several gallstones. There is mild pericholecystic fluid. There is mild extrahepatic biliary dilatation with the common hepatic duct measuring up to 8 mm in diameter. The duct tapers distally. No evidence of choledocholithiasis. No intrahepatic biliary dilatation. Pancreas:  Unremarkable. No pancreatic ductal dilatation or surrounding inflammatory changes. Spleen: Normal in size without focal abnormality. Adrenals/Urinary Tract: Both adrenal glands appear normal. No evidence of hydronephrosis or suspicious renal lesion. Bladder not imaged. Stomach/Bowel: Moderate stool throughout the colon. No evidence of bowel distension or inflammation. Vascular/Lymphatic: There are no enlarged abdominal lymph nodes. No significant vascular findings. Other: No evidence of abdominal wall hernia or ascites. Musculoskeletal: No acute or significant osseous findings. IMPRESSION: 1. Cholelithiasis with mild pericholecystic fluid, suspicious for acute cholecystitis. A sonographic MPercell Millersign was not reported on earlier ultrasound. Correlate clinically. 2. Mild extrahepatic biliary dilatation without evidence of choledocholithiasis. 3. No other significant abdominal findings. 4. Moderate size hiatal hernia. Electronically Signed   By: WRichardean SaleM.D.   On: 04/09/2022 09:52   [place] LIMITED RUQ (LIVER/GB)  Result Date: 04/09/2022 CLINICAL DATA:  66year old female with history of right upper quadrant abdominal pain for the past 5 months. EXAM: ULTRASOUND ABDOMEN LIMITED RIGHT UPPER QUADRANT COMPARISON:  Abdominal ultrasound 03/25/2022. FINDINGS: Gallbladder: Echogenic foci with posterior acoustic shadowing noted lying dependently in the gallbladder, measuring up to 1.9 cm in diameter. Gallbladder is moderately distended. Gallbladder wall thickness is upper limits of normal at 3 mm. Trace amount of pericholecystic fluid. However, per report from the sonographer, there was no sonographic Murphy's sign on examination. Common bile duct: Diameter: 8.5 mm. Liver: No focal lesion identified. Within normal limits in parenchymal echogenicity. Portal vein is patent on color Doppler imaging with normal direction of blood flow towards the liver. Other: None. IMPRESSION: 1. Cholelithiasis with trace volume  of pericholecystic fluid, but no frank sonographic Murphy's sign on examination. Findings are equivocal, but not strongly suggestive of acute cholecystitis given the lack of sonographic Murphy's sign. 2. However, there is dilatation of the common bile duct (8.5 mm). If there is clinical concern for biliary tract obstruction from choledocholithiasis, further evaluation with abdominal MRI with and without IV gadolinium with MRCP should be considered at this time. Electronically Signed   By: DVinnie LangtonM.D.   On: 04/09/2022 05:53   [place]domen Limited RUQ (LIVER/GB)  Result Date: 03/25/2022 CLINICAL DATA:  [id number] pain [id number] EXAM: ULTRASOUND ABDOMEN LIMITED RIGHT UPPER QUADRANT COMPARISON:  Ultrasound abdomen 02/21/2022 common nuclear medicine hepatic dome scan 03/07/2022 FINDINGS: Gallbladder: Calcified stone within the gallbladder lumen. No gallbladder wall thickening or pericholecystic fluid visualized. No sonographic Murphy sign noted by sonographer. Common bile duct: Diameter: 9 mm. Liver: No focal lesion identified. Within normal limits in parenchymal echogenicity. Portal vein is patent on color Doppler imaging with normal direction of blood  flow towards the liver. Other: None. IMPRESSION: Cholelithiasis with no acute cholecystitis. Electronically Signed   By: Iven Finn M.D.   On: 03/25/2022 19:20   US THYROID  Result Date: 03/19/2022 CLINICAL DATA:  Thyromegaly on physical examination Voice changes Palpable regions in the midline anterior inferior neck EXAM: THYROID ULTRASOUND TECHNIQUE: Ultrasound examination of the thyroid gland and adjacent soft tissues was performed. COMPARISON:  None available FINDINGS: Parenchymal Echotexture: Mildly heterogenous Isthmus: 0.4 cm Right lobe: 4.6 x 1.8 x 1.3 cm Left lobe: 4.3 x 1.5 x 1.2 cm _________________________________________________________ Estimated total number of nodules >/= 1 cm: 1 Number of spongiform nodules >/=  2 cm not described below  (TR1): 0 Number of mixed cystic and solid nodules >/= 1.5 cm not described below (TR2): 0 _________________________________________________________ Nodule # 2: Location: Right; inferior Maximum size: 1.1 cm; Other 2 dimensions: 1.0 x 0.6 cm Composition: solid/almost completely solid (2) Echogenicity: hypoechoic (2) Shape: not taller-than-wide (0) Margins: ill-defined (0) Echogenic foci: none (0) ACR TI-RADS total points: 4. ACR TI-RADS risk category: TR4 (4-6 points). ACR TI-RADS recommendations: *Given size (>/= 1 - 1.4 cm) and appearance, a follow-up ultrasound in 1 year should be considered based on TI-RADS criteria. _________________________________________________________ Remaining subcentimeter thyroid nodules do not meet criteria for FNA or imaging follow-up. IMPRESSION: 1. Nodule 2 (TI-RADS 4), measuring 1.1 cm, located in the inferior right thyroid lobe meets criteria for imaging follow-up. Annual ultrasound surveillance is recommended until 5 years of stability is documented. 2. No discrete sonographic abnormality identified on targeted evaluation of the palpable region in the anterior inferior neck. The above is in keeping with the ACR TI-RADS recommendations - J Am Coll Radiol 2017;14:587-595. Electronically Signed   By: Miachel Roux M.D.   On: 03/19/2022 10:12    Microbiology: Results for orders placed or performed in visit on 35/00/93  Helicobacter pylori special antigen     Status: None   Collection Time: 02/24/22 10:23 AM  Result Value Ref Range Status   MICRO NUMBER: [id number]  Final   SPECIMEN QUALITY Adequate  Final   SOURCE: STOOL  Final   STATUS: FINAL  Final   RESULT:   Final    Not Detected  Antimicrobials, proton pump inhibitors, and bismuth preparations inhibit H. pylori and ingestion up to two weeks prior to testing may cause false negative results. If clinically indicated the test should be repeated on a new specimen  obtained two weeks after discontinuing treatment.      Comment: Reference Range:Not Detected    Labs: CBC: Recent Labs  Lab 04/09/22 0136 04/10/22 0735 04/11/22 0458 04/12/22 0527 04/13/22 0437  WBC 8.2 4.8 8.6 7.9 5.6  HGB 13.5 13.6 12.6 11.7* 11.5*  HCT 41.6 42.5 38.3 36.0 35.9*  MCV 87.9 89.7 87.6 88.0 89.8  PLT 231 229 215 215 169   Basic Metabolic Panel: Recent Labs  Lab 04/09/22 0136 04/10/22 0735 04/11/22 0458 04/12/22 0527 04/13/22 0437  NA 139 138 140 141 143  K 3.7 3.7 4.1 3.8 4.0  CL 108 107 108 108 112*  CO2 '26 27 23 26 26  '$ GLUCOSE 152* 145* 126* 123* 99  BUN '17 11 13 18 20  '$ CREATININE 0.75 0.87 0.99 1.57* 1.41*  CALCIUM 9.3 9.0 9.2 9.2 9.1   Liver Function Tests: Recent Labs  Lab 04/09/22 0136 04/10/22 0735 04/11/22 0458 04/12/22 0527 04/13/22 0437  AST 200* 224* 262* 200* 150*  ALT 137* 345* 356* 340* 304*  ALKPHOS 119 134* 144* 135* 128*  BILITOT 2.3* 1.8* 3.5* 3.1* 2.1*  PROT 7.2 6.8 6.4* 6.3* 5.6*  ALBUMIN 3.8 3.4* 3.3* 3.1* 2.9*   CBG: Recent Labs  Lab 04/12/22 1129 04/12/22 2336 04/13/22 0532 04/13/22 0728 04/13/22 1127  GLUCAP 118* 124* 98 104* 103*    Discharge time spent: less than 30 minutes.  Signed: Marylu Lund, MD [facility] 04/13/2022

## 2022-04-14 ENCOUNTER — Telehealth: Payer: Self-pay

## 2022-04-14 ENCOUNTER — Encounter (HOSPITAL_COMMUNITY): Payer: Self-pay | Admitting: Gastroenterology

## 2022-04-14 LAB — SURGICAL PATHOLOGY

## 2022-04-14 NOTE — Telephone Encounter (Signed)
Transition Care Management Follow-up Telephone Call Date of discharge and from where: [facility] 04-13-22 Dx: acute cholecystitis  How have you been since you were released from the hospital? Doing ok  Any questions or concerns? No  Items Reviewed: Did the pt receive and understand the discharge instructions provided? Yes  Medications obtained and verified? Yes  Other? No  Any new allergies since your discharge? No  Dietary orders reviewed? Yes Do you have support at home? Yes   Home Care and Equipment/Supplies: Were home health services ordered? no If so, what is the name of the agency? na  Has the agency set up a time to come to the patient's home? not applicable Were any new equipment or medical supplies ordered?  No What is the name of the medical supply agency? na Were you able to get the supplies/equipment? not applicable Do you have any questions related to the use of the equipment or supplies? No  Functional Questionnaire: (I = Independent and D = Dependent) ADLs: I  Bathing/Dressing- I  Meal Prep- I  Eating- I  Maintaining continence- I  Transferring/Ambulation- I  Managing Meds- I  Follow up appointments reviewed:  PCP Hospital f/u appt confirmed? Yes  [facility] No  -pt aware to fu with GI and plans to call . Are transportation arrangements needed? No  If their condition worsens, is the pt aware to call PCP or go to the Emergency Dept.? Yes Was the patient provided with contact information for the PCP's office or ED? Yes Was to pt encouraged to call back with questions or concerns? Yes   Juanda Crumble LPN [place] Direct Dial [phone]

## 2022-04-20 ENCOUNTER — Encounter: Payer: Self-pay | Admitting: Gastroenterology

## 2022-04-21 ENCOUNTER — Ambulatory Visit (INDEPENDENT_AMBULATORY_CARE_PROVIDER_SITE_OTHER): Payer: Medicare Other | Admitting: Internal Medicine

## 2022-04-21 ENCOUNTER — Encounter: Payer: Self-pay | Admitting: Internal Medicine

## 2022-04-21 VITALS — BP 120/60 | HR 48 | Temp 98.5°F | Ht 66.0 in | Wt 212.0 lb

## 2022-04-21 DIAGNOSIS — B372 Candidiasis of skin and nail: Secondary | ICD-10-CM | POA: Diagnosis not present

## 2022-04-21 DIAGNOSIS — E049 Nontoxic goiter, unspecified: Secondary | ICD-10-CM | POA: Diagnosis not present

## 2022-04-21 DIAGNOSIS — I10 Essential (primary) hypertension: Secondary | ICD-10-CM

## 2022-04-21 DIAGNOSIS — R7989 Other specified abnormal findings of blood chemistry: Secondary | ICD-10-CM

## 2022-04-21 LAB — COMPREHENSIVE METABOLIC PANEL
ALT: 79 U/L — ABNORMAL HIGH (ref 0–35)
AST: 33 U/L (ref 0–37)
Albumin: 4.2 g/dL (ref 3.5–5.2)
Alkaline Phosphatase: 151 U/L — ABNORMAL HIGH (ref 39–117)
BUN: 15 mg/dL (ref 6–23)
CO2: 29 mEq/L (ref 19–32)
Calcium: 9.8 mg/dL (ref 8.4–10.5)
Chloride: 106 mEq/L (ref 96–112)
Creatinine, Ser: 0.84 mg/dL (ref 0.40–1.20)
GFR: 73.05 mL/min (ref >60.00)
Glucose, Bld: 110 mg/dL — ABNORMAL HIGH (ref 70–99)
Potassium: 4 mEq/L (ref 3.5–5.1)
Sodium: 142 mEq/L (ref 135–145)
Total Bilirubin: 1.1 mg/dL (ref 0.2–1.2)
Total Protein: 7.6 g/dL (ref 6.0–8.3)

## 2022-04-21 MED ORDER — AMLODIPINE BESYLATE 2.5 MG PO TABS: 2.5000 mg | ORAL_TABLET | Freq: Every day | ORAL | 3 refills | Status: DC

## 2022-04-21 MED ORDER — CLOTRIMAZOLE-BETAMETHASONE 1-0.05 % EX CREA: 1.0000 | TOPICAL_CREAM | Freq: Two times a day (BID) | CUTANEOUS | 3 refills | Status: DC

## 2022-04-21 NOTE — Patient Instructions (Signed)
We will check the labs today. 

## 2022-04-21 NOTE — Progress Notes (Signed)
   Subjective:   Patient ID: Shannon Navarro, female    DOB: 24-Jan-1956, 66 y.o.   MRN: [id number]  HPI The patient is a 66 YO female coming in for hospital follow up. Recovering but still tired. Some bowel changes mild constipation.   Review of Systems  Constitutional:  Positive for activity change, appetite change and fatigue.  HENT: Negative.    Eyes: Negative.   Respiratory:  Negative for cough, chest tightness and shortness of breath.   Cardiovascular:  Negative for chest pain, palpitations and leg swelling.  Gastrointestinal:  Positive for constipation. Negative for abdominal distention, abdominal pain, diarrhea, nausea and vomiting.  Musculoskeletal: Negative.   Skin: Negative.   Neurological: Negative.   Psychiatric/Behavioral: Negative.      Objective:  Physical Exam Constitutional:      Appearance: She is well-developed.  HENT:     Head: Normocephalic and atraumatic.  Cardiovascular:     Rate and Rhythm: Normal rate and regular rhythm.  Pulmonary:     Effort: Pulmonary effort is normal. No respiratory distress.     Breath sounds: Normal breath sounds. No wheezing or rales.  Abdominal:     General: Bowel sounds are normal. There is no distension.     Palpations: Abdomen is soft.     Tenderness: There is no abdominal tenderness. There is no rebound.     Comments: Wounds from surgery examined, no signs of infection, yeast infection on the lower folds of stomach  Musculoskeletal:     Cervical back: Normal range of motion.  Skin:    General: Skin is warm and dry.  Neurological:     Mental Status: She is alert and oriented to person, place, and time.     Coordination: Coordination normal.     Vitals:   04/21/22 1028  BP: 120/60  Pulse: (!) 48  Temp: 98.5 F (36.9 C)  TempSrc: Oral  SpO2: 98%  Weight: 212 lb (96.2 kg)  Height: '5\' 6"'$  (1.676 m)    Assessment & Plan:  Visit time 20 minutes in face to face communication with patient and coordination of care,  additional 10 minutes spent in record review, coordination or care, ordering tests, communicating/referring to other healthcare professionals, documenting in medical records all on the same day of the visit for total time 30 minutes spent on the visit.

## 2022-04-21 NOTE — Assessment & Plan Note (Signed)
Reviewed US neck and needs repeat in 1 year.

## 2022-04-21 NOTE — Assessment & Plan Note (Signed)
Checking CMP today. She did have acute elevation in the hospital. No signs of jaundice clinically.

## 2022-04-21 NOTE — Assessment & Plan Note (Signed)
Rx amlodipine 2.5 mg daily. Due to AKI in hospital her ARB was held. Checking CMP today. BP at goal today.

## 2022-04-21 NOTE — Assessment & Plan Note (Signed)
Rx clotrimazole/nystatin cream to use on the stomach.

## 2022-05-16 ENCOUNTER — Other Ambulatory Visit (INDEPENDENT_AMBULATORY_CARE_PROVIDER_SITE_OTHER): Payer: Medicare Other

## 2022-05-16 ENCOUNTER — Encounter: Payer: Self-pay | Admitting: Physician Assistant

## 2022-05-16 ENCOUNTER — Ambulatory Visit (INDEPENDENT_AMBULATORY_CARE_PROVIDER_SITE_OTHER): Payer: Medicare Other | Admitting: Physician Assistant

## 2022-05-16 VITALS — BP 118/78 | HR 63 | Ht 66.0 in | Wt 212.0 lb

## 2022-05-16 DIAGNOSIS — R7989 Other specified abnormal findings of blood chemistry: Secondary | ICD-10-CM

## 2022-05-16 DIAGNOSIS — K8050 Calculus of bile duct without cholangitis or cholecystitis without obstruction: Secondary | ICD-10-CM

## 2022-05-16 DIAGNOSIS — K219 Gastro-esophageal reflux disease without esophagitis: Secondary | ICD-10-CM | POA: Diagnosis not present

## 2022-05-16 LAB — HEPATIC FUNCTION PANEL
ALT: 30 U/L (ref 0–35)
AST: 20 U/L (ref 0–37)
Albumin: 3.9 g/dL (ref 3.5–5.2)
Alkaline Phosphatase: 88 U/L (ref 39–117)
Bilirubin, Direct: 0.2 mg/dL (ref 0.0–0.3)
Total Bilirubin: 0.8 mg/dL (ref 0.2–1.2)
Total Protein: 7 g/dL (ref 6.0–8.3)

## 2022-05-16 MED ORDER — FAMOTIDINE 40 MG PO TABS: 40.0000 mg | ORAL_TABLET | Freq: Every day | ORAL | 3 refills | Status: DC

## 2022-05-16 NOTE — Progress Notes (Signed)
Subjective:    Patient ID: Shannon Navarro, female    DOB: Sep 19, 1955, 67 y.o.   MRN: [id number]  HPI Shannon Navarro is a pleasant 67 year old white female, established with Dr. Roland Rack who comes in today for post hospital follow-up after recent admission 66/29/2023 with acute cholecystitis.  She underwent laparoscopic cholecystectomy on 04/10/2022 per Dr. Kieth Brightly and had positive IOC. She was seen in consult by GI and underwent ERCP per Dr. Lyndel Safe on 04/11/2022.  She also had complained of dysphagia prior to that procedure and was found to have a Schatzki's ring at the GE junction, 2 cm hiatal hernia and was dilated to 18 mm with balloon.  Noted to have some mild scalloping in the duodenum and therefore duodenal biopsies were done (which were negative) and on ERCP portion was found to have 2 filling defects 1 6 and one 8 mm stone, no evidence of leak.  She underwent sphincterotomy and balloon sweeping with clearing of the duct. She did have significant LFT elevation prior to the ERCP.  Most recent labs were done on 04/21/2022 after discharge from the hospital and T. bili was down to 1.1/AST 33/ALT 79/alk phos 151 all much improved. She says she is feeling well at this time and has no complaints of abdominal discomfort.  She was worried about the finding of the elevated liver enzymes during her hospitalization and wanted to be sure that she did not have any other underlying liver issues.  We reviewed her MRI findings which showed a normal-appearing liver, no evidence of steatosis or liver lesion. She says she had a lot of medical issues in 2023 and is hoping to have no issues in 2024. She had required a cervical spine fusion in June 2023, and then underwent a tonsillectomy earlier this fall. She does have chronic GERD, has recently been using Protonix just on a as needed basis and not requiring it daily.  She is asking if there is any other medication that she could use as she is concerned about chronic  kidney disease..  No dysphagia complaints since she underwent the esophageal dilation. Last colonoscopy was done in February 2022 with finding of scattered diverticulosis medium sized internal hemorrhoids and had one 5 mm polyp removed which showed polypoid mucosa with no adenomatous change and is therefore indicated for 10-year interval follow-up.  Review of Systems. Pertinent positive and negative review of systems were noted in the above HPI section.  All other review of systems was otherwise negative.   Outpatient Encounter Medications as of 05/16/2022  Medication Sig   amLODipine (NORVASC) 2.5 MG tablet Take 1 tablet (2.5 mg total) by mouth daily.   cetirizine (ZYRTEC) 10 MG tablet Take 10 mg by mouth daily.   Cholecalciferol (VITAMIN D) 125 MCG (5000 UT) CAPS Take 5,000 Units by mouth daily.   clotrimazole-betamethasone (LOTRISONE) cream Apply 1 Application topically 2 (two) times daily.   famotidine (PEPCID) 40 MG tablet Take 1 tablet (40 mg total) by mouth daily before breakfast.   fluticasone (FLONASE) 50 MCG/ACT nasal spray Place 2 sprays into both nostrils daily. (Patient taking differently: Place 2 sprays into both nostrils daily as needed for allergies.)   [DISCONTINUED] pantoprazole (PROTONIX) 40 MG tablet TAKE 1 TABLET BY MOUTH TWICE A DAY BEFORE A MEAL (Patient taking differently: Take 40 mg by mouth 2 (two) times daily.)   Caraway Oil-Levomenthol (FDGARD) 25-20.75 MG CAPS Use as directed (Patient not taking: Reported on 05/16/2022)   ondansetron (ZOFRAN-ODT) 4 MG disintegrating tablet Take  1 tablet (4 mg total) by mouth every 8 (eight) hours as needed for nausea or vomiting. (Patient not taking: Reported on 04/21/2022)   polyethylene glycol (MIRALAX / GLYCOLAX) 17 g packet Take 17 g by mouth daily. (Patient not taking: Reported on 05/16/2022)   No facility-administered encounter medications on file as of 05/16/2022.   Allergies  Allergen Reactions   Cefaclor Rash and Other (See  Comments)   Cipro [Ciprofloxacin Hcl] Rash    Hands were red, skin was red    Codeine Nausea And Vomiting   Hydrocodone Nausea And Vomiting    Severe vomiting/  COUGH SYRUP CAUSE SEVERE VOMITING   Levaquin [Levofloxacin] Nausea Only   Nexium [Esomeprazole Magnesium] Other (See Comments)    Made legs ache   Penicillins Rash   Sulfa Antibiotics Rash   Patient Active Problem List   Diagnosis Date Noted   Candidal skin infection 04/21/2022   Biliary dyskinesia 03/28/2022   Enlarged thyroid 03/14/2022   Elevated LFTs 03/14/2022   Fatigue 12/26/2021   Radiculomyelopathy 10/24/2021   Left cervical radiculopathy 10/15/2021   Cough 10/08/2021   Wheezing 10/08/2021   Palpitations 08/07/2021   Chronic left shoulder pain 06/11/2021   Lipoma 10/03/2019   Non-rheumatic mitral regurgitation 08/22/2019   Rash 03/08/2019   Hematoma of lower leg 11/17/2018   PAF (paroxysmal atrial fibrillation) ([place]) 03/05/2017   Sinus bradycardia 04/11/2015   Routine general medical examination at a health care facility 06/15/2014   Vitamin D deficiency    OSA (obstructive sleep apnea) 01/16/2012   Essential hypertension 06/21/2010   Prediabetes 12/10/2009   Anxiety 10/05/2009   GERD 10/05/2009   Allergic rhinitis 10/03/2008   Social History   Socioeconomic History   Marital status: Married    Spouse name: Not on file   Number of children: 2   Years of education: Not on file   Highest education level: Not on file  Occupational History   Occupation: Retired from [employer]: [employer]: from [place], currently works [employer] in [place] Use   Smoking status: Never   Smokeless tobacco: Never   Tobacco comments:    Married lives with spouse + 2 daughters  Vaping Use   Vaping Use: Never used  Substance and Sexual Activity   Alcohol use: No   Drug use: No   Sexual activity: Not on file  Other Topics Concern   Not on file  Social History Narrative    Occupation: Management, social services. Married, lives with husband. Has 2 daughter 3 - 71   Social Determinants of Radio broadcast assistant Strain: Not on file  Food Insecurity: No Food Insecurity (04/09/2022)   Hunger Vital Sign    Worried About Running Out of Food in the Last Year: Never true    Ran Out of Food in the Last Year: Never true  Transportation Needs: No Transportation Needs (04/09/2022)   PRAPARE - Hydrologist (Medical): No    Lack of Transportation (Non-Medical): No  Physical Activity: Not on file  Stress: Not on file  Social Connections: Not on file  Intimate Partner Violence: Not At Risk (04/09/2022)   Humiliation, Afraid, Rape, and Kick questionnaire    Fear of Current or Ex-Partner: No    Emotionally Abused: No    Physically Abused: No    Sexually Abused: No    Shannon Navarro's family history includes Aortic  stenosis in her father; Asthma in her mother; Breast cancer in her maternal aunt, maternal aunt, mother, and niece; Colon cancer in an other family member; Colon polyps in her father; Diabetes in her father; Esophageal cancer in an other family member; Heart disease in her father; Hypertension in her father and mother; Prostate cancer in her father; Stomach cancer in an other family member.      Objective:    Vitals:   05/16/22 1103  BP: 118/78  Pulse: 63  SpO2: 95%    Physical Exam Well-developed well-nourished older white female in no acute distress.  Height, Weight, 212 BMI 34.2  HEENT; nontraumatic normocephalic, EOMI, PE R LA, sclera anicteric.  Extremities; no clubbing cyanosis or edema skin warm and dry Neuro/Psych; alert and oriented x4, grossly nonfocal mood and affect appropriate        Assessment & Plan:   #82 67 year old white female here for post hospital follow-up after recent admission late November 2023 with acute cholecystitis. Status post laparoscopic cholecystectomy 04/10/2022.  Had elevated  LFTs and positive IOC Status post ERCP with stone extraction 04/11/2022 Significant improvement in LFTs as of 04/21/2022  #2 patient also had complaints of dysphagia at the time of hospitalization and had dilation of a Schatzki's ring at the GE junction done at the same time as ERCP. No current dysphagia #3 chronic GERD-has not been requiring daily PPI and interested in trial of H2 blocker rather than chronic PPI therapy.  #4 colon cancer surveillance-up-to-date with last colonoscopy February 2022 1 benign colon polyp removed and indicated for 10-year interval follow-up #5.  Diverticulosis #6 history of atrial fibrillation #7.  Sleep apnea #8.  Status post cervical spine fusion June 2023 #9.  Status post tonsillectomy 2023  Plan; hepatic panel today-will follow to normalization Antireflux regimen and diet Will give her a trial off of PPI therapy and start Pepcid 40 mg p.o. every morning #30 and 3 refills.  If she finds Pepcid to control her reflux symptoms, we can give her a longer term prescription for 40 mg of Pepcid. Will follow-up with Dr. Roland Rack or myself on an as-needed basis   Mallorey Odonell Genia Harold PA-C 05/16/2022   Cc: [name], *

## 2022-05-16 NOTE — Patient Instructions (Addendum)
Your provider has requested that you go to the basement level for lab work before leaving today. Press "B" on the elevator. The lab is located at the first door on the left as you exit the elevator.  Due to recent changes in healthcare laws, you may see the results of your imaging and laboratory studies on MyChart before your provider has had a chance to review them.  We understand that in some cases there may be results that are confusing or concerning to you. Not all laboratory results come back in the same time frame and the provider may be waiting for multiple results in order to interpret others.  Please give [place] 48 hours in order for your provider to thoroughly review all the results before contacting the office for clarification of your results.   Stop your pantoprazole and start famotidine. If this is effective call us back and we can refill it for a year for you.  We have provided you with GERD information to read and follow.   I appreciate the opportunity to care for you. Amy Esterwood, PA-C

## 2022-05-29 ENCOUNTER — Ambulatory Visit (INDEPENDENT_AMBULATORY_CARE_PROVIDER_SITE_OTHER): Payer: Medicare Other | Admitting: Podiatry

## 2022-05-29 ENCOUNTER — Encounter: Payer: Self-pay | Admitting: Podiatry

## 2022-05-29 ENCOUNTER — Other Ambulatory Visit: Payer: Self-pay | Admitting: Podiatry

## 2022-05-29 ENCOUNTER — Ambulatory Visit (INDEPENDENT_AMBULATORY_CARE_PROVIDER_SITE_OTHER): Payer: Medicare Other

## 2022-05-29 DIAGNOSIS — M67472 Ganglion, left ankle and foot: Secondary | ICD-10-CM

## 2022-05-29 DIAGNOSIS — M778 Other enthesopathies, not elsewhere classified: Secondary | ICD-10-CM | POA: Diagnosis not present

## 2022-05-29 NOTE — Progress Notes (Signed)
1 Subjective:  Patient ID: Shannon Navarro, female    DOB: 05-10-1956,  MRN: [id number] HPI Chief Complaint  Patient presents with   Foot Pain    Dorsal forefoot left - knot, tender x l week (just noticed)    Nail Problem    Hallux right - lateral border, dark nail   New Patient (Initial Visit)    Est pt 2019    67 y.o. female presents with the above complaint.   ROS: Denies fever chills nausea vomit muscle aches pains calf pain back pain chest pain shortness of breath.  Daycare  Past Medical History:  Diagnosis Date   Allergic rhinitis    Anticoagulant long-term use    XARELTO   ANXIETY    Dysrhythmia    hx of atrial fib x 2 per pt   Endometrial polyp    Endometrial polyp    Fatty liver 06/23/2007   GERD    Herniated disc, 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   History of esophageal stricture    mainly due to gerd   History of exercise stress test 06/13/2015   PER DR HOCHREIN NOTE -- NO EVIDENCE ISCHEMIA   HYPERTENSION    Impaired fasting glucose    Migraine    hx of migraines none recent   OSA on CPAP followed by dr Halford Chessman   per study 02-09-2012  severe osa w/ AHI 33.8   Paroxysmal atrial fibrillation ([facility]) cardioloigst-  dr hochrein   dx 02-21-2016 at ED   Pneumonia    with COVID   PONV (postoperative nausea and vomiting)    after surgery in 10/2021- had nausea after surgery related to preop drink   Pre-diabetes    Pseudotumor cerebri 2007   S/P cervical spinal fusion 10/24/2021   Sleep apnea    Vitamin D deficiency 07/2013   Past Surgical History:  Procedure Laterality Date   ABDOMINAL HYSTERECTOMY     ANTERIOR CERVICAL DECOMPRESSION/DISCECTOMY FUSION 4 LEVELS N/A 10/24/2021   Procedure: ANTERIOR CERVICAL DECOMPRESSION FUSION CERVICAL FOUR THROUGH CERVICAL FIVE, CERVICAL FIVE THROUGH CERVICAL SIX, CERVICAL SIX THROUGH CERVICAL SEVEN WITH INSTRUMENTATION AND ALLOGRAFT;  Surgeon: Phylliss Bob, MD;  Location: [facility];  Service: Orthopedics;  Laterality: N/A;   BALLOON  DILATION N/A 04/11/2022   Procedure: BALLOON DILATION;  Surgeon: Jackquline Denmark, MD;  Location: [facility];  Service: Gastroenterology;  Laterality: N/A;   BIOPSY  04/11/2022   Procedure: BIOPSY;  Surgeon: Jackquline Denmark, MD;  Location: [facility];  Service: Gastroenterology;;   BREAST BIOPSY Right 08/12/2019    FIBROADENOMA    CHOLECYSTECTOMY N/A 04/10/2022   Procedure: LAPAROSCOPIC CHOLECYSTECTOMY WITH INTRAOPERATIVE CHOLANGIOGRAM;  Surgeon: Mickeal Skinner, MD;  Location: [facility];  Service: General;  Laterality: N/A;   COLONOSCOPY     colonscopy  2007   normal   DILATATION & CURETTAGE/HYSTEROSCOPY WITH MYOSURE N/A 03/26/2017   Procedure: DILATATION & CURETTAGE/HYSTEROSCOPY WITH MYOSURE;  Surgeon: Paula Compton, MD;  Location: [facility];  Service: Gynecology;  Laterality: N/A;   DILATION AND CURETTAGE OF UTERUS  2010   ERCP N/A 04/11/2022   Procedure: ENDOSCOPIC RETROGRADE CHOLANGIOPANCREATOGRAPHY (ERCP);  Surgeon: Jackquline Denmark, MD;  Location: [facility];  Service: Gastroenterology;  Laterality: N/A;   ESOPHAGOGASTRODUODENOSCOPY N/A 04/11/2022   Procedure: ESOPHAGOGASTRODUODENOSCOPY (EGD);  Surgeon: Jackquline Denmark, MD;  Location: [facility];  Service: Gastroenterology;  Laterality: N/A;   FRACTURE SURGERY     LAPAROSCOPIC HYSTERECTOMY Bilateral 06/18/2017   Procedure: HYSTERECTOMY TOTAL LAPAROSCOPIC;  Surgeon: Paula Compton, MD;  Location: [facility];  Service: Gynecology;  Laterality: Bilateral;  OUT PT IN BED, needs extra time MD request 2.5hrs in OR   REMOVAL OF STONES  04/11/2022   Procedure: REMOVAL OF STONES;  Surgeon: Jackquline Denmark, MD;  Location: [facility];  Service: Gastroenterology;;   right knee arthroscopy  03/2013   SPHINCTEROTOMY  04/11/2022   Procedure: Joan Mayans;  Surgeon: Jackquline Denmark, MD;  Location: [facility];  Service: Gastroenterology;;   TONSILLECTOMY Bilateral 01/10/2022   Procedure: TONSILLECTOMY;  Surgeon:  Izora Gala, MD;  Location: [facility];  Service: ENT;  Laterality: Bilateral;   torn meniscus surgery on right knee      TRANSTHORACIC ECHOCARDIOGRAM  03/11/2016   ef 60-65%/  mild MR/ trivial PR and TR   TUBAL LIGATION     UPPER GASTROINTESTINAL ENDOSCOPY  11/2020    Current Outpatient Medications:    amLODipine (NORVASC) 2.5 MG tablet, Take 1 tablet (2.5 mg total) by mouth daily., Disp: 90 tablet, Rfl: 3   Caraway Oil-Levomenthol (FDGARD) 25-20.75 MG CAPS, Use as directed (Patient not taking: Reported on 05/16/2022), Disp: 16 capsule, Rfl: 0   cetirizine (ZYRTEC) 10 MG tablet, Take 10 mg by mouth daily., Disp: , Rfl:    Cholecalciferol (VITAMIN D) 125 MCG (5000 UT) CAPS, Take 5,000 Units by mouth daily., Disp: , Rfl:    clotrimazole-betamethasone (LOTRISONE) cream, Apply 1 Application topically 2 (two) times daily., Disp: 100 g, Rfl: 3   famotidine (PEPCID) 40 MG tablet, Take 1 tablet (40 mg total) by mouth daily before breakfast., Disp: 30 tablet, Rfl: 3   fluticasone (FLONASE) 50 MCG/ACT nasal spray, Place 2 sprays into both nostrils daily. (Patient taking differently: Place 2 sprays into both nostrils daily as needed for allergies.), Disp: 48 g, Rfl: 6   ondansetron (ZOFRAN-ODT) 4 MG disintegrating tablet, Take 1 tablet (4 mg total) by mouth every 8 (eight) hours as needed for nausea or vomiting. (Patient not taking: Reported on 04/21/2022), Disp: 20 tablet, Rfl: 0   polyethylene glycol (MIRALAX / GLYCOLAX) 17 g packet, Take 17 g by mouth daily. (Patient not taking: Reported on 05/16/2022), Disp: 14 each, Rfl: 0  Allergies  Allergen Reactions   Cefaclor Rash and Other (See Comments)   Cipro [Ciprofloxacin Hcl] Rash    Hands were red, skin was red    Codeine Nausea And Vomiting   Hydrocodone Nausea And Vomiting    Severe vomiting/  COUGH SYRUP CAUSE SEVERE VOMITING   Levaquin [Levofloxacin] Nausea Only   Nexium [Esomeprazole Magnesium] Other (See Comments)    Made legs ache   Penicillins  Rash   Sulfa Antibiotics Rash   Review of Systems Objective:  There were no vitals filed for this visit.  General: Well developed, nourished, in no acute distress, alert and oriented x3   Dermatological: Skin is warm, dry and supple bilateral. Nails x 10 are well maintained; remaining integument appears unremarkable at this time. There are no open sores, no preulcerative lesions, no rash or signs of infection present.  Vascular: Dorsalis Pedis artery and Posterior Tibial artery pedal pulses are 2/4 bilateral with immedate capillary fill time. Pedal hair growth present. No varicosities and no lower extremity edema present bilateral.   Neruologic: Grossly intact via light touch bilateral. Vibratory intact via tuning fork bilateral. Protective threshold with Semmes Wienstein monofilament intact to all pedal sites bilateral. Patellar and Achilles deep tendon reflexes 2+ bilateral. No Babinski or clonus noted bilateral.   Musculoskeletal: No gross boney pedal deformities bilateral. No pain, crepitus, or  limitation noted with foot and ankle range of motion bilateral. Muscular strength 5/5 in all groups tested bilateral.  1.5 cm patient nonpulsatile nature dorsal aspect third metatarsal area left foot.    Gait: Unassisted, Nonantalgic.    Radiographs:  Radiographs taken today demonstrate a nonpulsatile mass today third mid diaphyseal region left foot.  Measures approximately a centimeter and a half in diameter most likely a ganglion.  Assessment & Plan:   Assessment: Ganglion cyst dorsal aspect left foot.  Subungual hematoma or bruise to the toenail hallux right  Plan: Aspiration was performed today after local anesthetic was administered she tolerated procedure well without complications once 1 cc of clear viscous fluid was removed I then placed a dry sterile compressive dressing we did discuss possible need for surgical intervention.     Nahdia Doucet T. [place], [place]

## 2022-05-31 ENCOUNTER — Other Ambulatory Visit: Payer: Self-pay | Admitting: Physician Assistant

## 2022-06-10 ENCOUNTER — Ambulatory Visit (INDEPENDENT_AMBULATORY_CARE_PROVIDER_SITE_OTHER): Payer: Medicare Other | Admitting: Internal Medicine

## 2022-06-10 ENCOUNTER — Encounter: Payer: Self-pay | Admitting: Internal Medicine

## 2022-06-10 VITALS — BP 122/76 | HR 56 | Temp 97.6°F | Ht 66.0 in | Wt 218.0 lb

## 2022-06-10 DIAGNOSIS — Z0000 Encounter for general adult medical examination without abnormal findings: Secondary | ICD-10-CM | POA: Diagnosis not present

## 2022-06-10 NOTE — Assessment & Plan Note (Signed)
Flu shot completed for flu season. Covid-19 counseled. Pneumonia completed. Shingrix completed. Tetanus due at pharmacy. Colonoscopy due 2032. Mammogram due 2025, pap smear aged out and dexa due 2025. Counseled about sun safety and mole surveillance. Counseled about the dangers of distracted driving. Given 10 year screening recommendations.

## 2022-06-10 NOTE — Progress Notes (Signed)
Subjective:   Patient ID: Shannon Navarro, female    DOB: May 08, 1956, 67 y.o.   MRN: [id number]  HPI Here for welcome to medicare wellness, no new complaints. Please see A/P for status and treatment of chronic medical problems.   Diet: heart healthy Physical activity: sedentary Depression/mood screen: negative Hearing: intact to whispered voice Visual acuity: grossly normal, performs annual eye exam  ADLs: capable Fall risk: none Home safety: good Cognitive evaluation: intact to orientation, naming, recall and repetition EOL planning: adv directives discussed, not in place  [employer] Visit from 06/10/2022 in [place] at [facility]  PHQ-2 Total Score [place] Visit from 06/10/2022 in [place] at [facility]: Lambert Rhonda W  PHQ-9 Total Score 0         04/12/2022    8:00 AM 04/12/2022    8:00 PM 04/13/2022    8:30 AM 04/21/2022   10:34 AM 06/10/2022    3:01 PM  [place] in the past year?    0 0  Was there an injury with Fall?    0 0  Fall Risk Category Calculator    0 0  Fall Risk Category (Retired)    Low   (RETIRED) Patient Fall Risk Level High fall risk High fall risk Low fall risk    Patient at Risk for Falls Due to     No Fall Risks  Fall risk Follow up    Falls evaluation completed Falls evaluation completed   Vision Screening   Right eye Left eye Both eyes  Without correction     With correction '20/20 20/20 20/15 '$   Functional Status Survey: Is the patient deaf or have difficulty hearing?: No Does the patient have difficulty seeing, even when wearing glasses/contacts?: No Does the patient have difficulty concentrating, remembering, or making decisions?: No Does the patient have difficulty walking or climbing stairs?: No Does the patient have difficulty dressing or bathing?: No Does the patient have difficulty doing errands alone such as visiting a doctor's office or shopping?: No  I have  personally reviewed and have noted 1. The patient's medical and social history - reviewed today no changes 2. Their use of alcohol, tobacco or illicit drugs 3. Their current medications and supplements 4. The patient's functional ability including ADL's, fall risks, home safety risks and hearing or visual impairment. 5. Diet and physical activities 6. Evidence for depression or mood disorders 7. Care team reviewed and updated 8.  The patient is not on an opioid pain medication.  Patient Care Team: [name], MD as PCP - General (Internal Medicine) Minus Breeding, MD as PCP - Cardiology (Cardiology) Lafayette Dragon, MD (Inactive) as Consulting Physician (Gastroenterology) Paula Compton, MD as Consulting Physician (Obstetrics and Gynecology) Chesley Mires, MD (Pulmonary Disease) Past Medical History:  Diagnosis Date   Allergic rhinitis    Anticoagulant long-term use    XARELTO   ANXIETY    Dysrhythmia    hx of atrial fib x 2 per pt   Endometrial polyp    Endometrial polyp    Fatty liver 06/23/2007   GERD    Herniated disc, 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   History of esophageal stricture    mainly due to gerd   History of exercise stress test 06/13/2015   PER DR HOCHREIN NOTE -- NO EVIDENCE ISCHEMIA   HYPERTENSION    Impaired fasting glucose    Migraine  hx of migraines none recent   OSA on CPAP followed by dr Halford Chessman   per study 02-09-2012  severe osa w/ AHI 33.8   Paroxysmal atrial fibrillation [facility]) cardioloigst-  dr hochrein   dx 02-21-2016 at ED   Pneumonia    with COVID   PONV (postoperative nausea and vomiting)    after surgery in 10/2021- had nausea after surgery related to preop drink   Pre-diabetes    Pseudotumor cerebri 2007   S/P cervical spinal fusion 10/24/2021   Sleep apnea    Vitamin D deficiency 07/2013   Past Surgical History:  Procedure Laterality Date   ABDOMINAL HYSTERECTOMY     ANTERIOR CERVICAL DECOMPRESSION/DISCECTOMY FUSION 4 LEVELS N/A  10/24/2021   Procedure: ANTERIOR CERVICAL DECOMPRESSION FUSION CERVICAL FOUR THROUGH CERVICAL FIVE, CERVICAL FIVE THROUGH CERVICAL SIX, CERVICAL SIX THROUGH CERVICAL SEVEN WITH INSTRUMENTATION AND ALLOGRAFT;  Surgeon: Phylliss Bob, MD;  Location: [place];  Service: Orthopedics;  Laterality: N/A;   BALLOON DILATION N/A 04/11/2022   Procedure: BALLOON DILATION;  Surgeon: Jackquline Denmark, MD;  [facility];  Service: Gastroenterology;  Laterality: N/A;   BIOPSY  04/11/2022   Procedure: BIOPSY;  Surgeon: Jackquline Denmark, MD;  [facility];  Service: Gastroenterology;;   BREAST BIOPSY Right 08/12/2019    FIBROADENOMA    CHOLECYSTECTOMY N/A 04/10/2022   Procedure: LAPAROSCOPIC CHOLECYSTECTOMY WITH INTRAOPERATIVE CHOLANGIOGRAM;  Surgeon: Mickeal Skinner, MD;  [facility];  Service: General;  Laterality: N/A;   COLONOSCOPY     colonscopy  2007   normal   DILATATION & CURETTAGE/HYSTEROSCOPY WITH MYOSURE N/A 03/26/2017   Procedure: DILATATION & CURETTAGE/HYSTEROSCOPY WITH MYOSURE;  Surgeon: Paula Compton, MD;  Location: [place];  Service: Gynecology;  Laterality: N/A;   DILATION AND CURETTAGE OF UTERUS  2010   ERCP N/A 04/11/2022   Procedure: ENDOSCOPIC RETROGRADE CHOLANGIOPANCREATOGRAPHY (ERCP);  Surgeon: Jackquline Denmark, MD;  Location: [facility];  Service: Gastroenterology;  Laterality: N/A;   ESOPHAGOGASTRODUODENOSCOPY N/A 04/11/2022   Procedure: ESOPHAGOGASTRODUODENOSCOPY (EGD);  Surgeon: Jackquline Denmark, MD;  Location: [facility];  Service: Gastroenterology;  Laterality: N/A;   FRACTURE SURGERY     LAPAROSCOPIC HYSTERECTOMY Bilateral 06/18/2017   Procedure: HYSTERECTOMY TOTAL LAPAROSCOPIC;  Surgeon: Paula Compton, MD;  Location: [place];  Service: Gynecology;  Laterality: Bilateral;  OUT PT IN BED, needs extra time MD request 2.5hrs in OR   REMOVAL OF STONES  04/11/2022   Procedure: REMOVAL OF STONES;  Surgeon: Jackquline Denmark, MD;   [facility];  Service: Gastroenterology;;   right knee arthroscopy  03/2013   SPHINCTEROTOMY  04/11/2022   Procedure: Joan Mayans;  Surgeon: Jackquline Denmark, MD;  Location: [facility];  Service: Gastroenterology;;   TONSILLECTOMY Bilateral 01/10/2022   Procedure: TONSILLECTOMY;  Surgeon: Izora Gala, MD;  Location: [place];  Service: ENT;  Laterality: Bilateral;   torn meniscus surgery on right knee      TRANSTHORACIC ECHOCARDIOGRAM  03/11/2016   ef 60-65%/  mild MR/ trivial PR and TR   TUBAL LIGATION     UPPER GASTROINTESTINAL ENDOSCOPY  11/2020   Family History  Problem Relation Age of Onset   Breast cancer Mother        20   Asthma Mother    Hypertension Mother    Prostate cancer Father    Aortic stenosis Father    Hypertension Father    Colon polyps Father    Heart disease Father    Diabetes Father    Breast cancer Maternal Aunt  Breast cancer Maternal Aunt    Esophageal cancer Other    Stomach cancer Other    Colon cancer Other    Breast cancer Niece        23s   Rectal cancer Neg Hx     Review of Systems  Constitutional: Negative.   HENT: Negative.    Eyes: Negative.   Respiratory:  Negative for cough, chest tightness and shortness of breath.   Cardiovascular:  Negative for chest pain, palpitations and leg swelling.  Gastrointestinal:  Negative for abdominal distention, abdominal pain, constipation, diarrhea, nausea and vomiting.  Musculoskeletal: Negative.   Skin: Negative.   Neurological: Negative.   Psychiatric/Behavioral: Negative.      Objective:  Physical Exam Constitutional:      Appearance: She is well-developed.  HENT:     Head: Normocephalic and atraumatic.  Cardiovascular:     Rate and Rhythm: Normal rate and regular rhythm.  Pulmonary:     Effort: Pulmonary effort is normal. No respiratory distress.     Breath sounds: Normal breath sounds. No wheezing or rales.  Abdominal:     General: Bowel sounds are normal. There is no  distension.     Palpations: Abdomen is soft.     Tenderness: There is no abdominal tenderness. There is no rebound.  Musculoskeletal:     Cervical back: Normal range of motion.  Skin:    General: Skin is warm and dry.  Neurological:     Mental Status: She is alert and oriented to person, place, and time.     Coordination: Coordination normal.     Vitals:   06/10/22 1456  BP: 122/76  Pulse: (!) 56  Temp: 97.6 F (36.4 C)  TempSrc: Temporal  SpO2: 98%  Weight: 218 lb (98.9 kg)  Height: '5\' 6"'$  (1.676 m)    Assessment & Plan:

## 2022-06-26 ENCOUNTER — Ambulatory Visit: Payer: Medicare Other | Attending: Nurse Practitioner | Admitting: Nurse Practitioner

## 2022-06-26 ENCOUNTER — Encounter: Payer: Self-pay | Admitting: Nurse Practitioner

## 2022-06-26 VITALS — BP 138/74 | HR 58 | Ht 66.0 in | Wt 220.8 lb

## 2022-06-26 DIAGNOSIS — I700 Atherosclerosis of aorta: Secondary | ICD-10-CM | POA: Diagnosis present

## 2022-06-26 DIAGNOSIS — G4733 Obstructive sleep apnea (adult) (pediatric): Secondary | ICD-10-CM | POA: Insufficient documentation

## 2022-06-26 DIAGNOSIS — R42 Dizziness and giddiness: Secondary | ICD-10-CM | POA: Diagnosis present

## 2022-06-26 DIAGNOSIS — R001 Bradycardia, unspecified: Secondary | ICD-10-CM | POA: Diagnosis present

## 2022-06-26 DIAGNOSIS — I480 Paroxysmal atrial fibrillation: Secondary | ICD-10-CM | POA: Insufficient documentation

## 2022-06-26 DIAGNOSIS — I10 Essential (primary) hypertension: Secondary | ICD-10-CM

## 2022-06-26 NOTE — Progress Notes (Signed)
Office Visit    Patient Name: Shannon Navarro Date of Encounter: 06/26/2022  Primary Care Provider:  [name], MD Primary Cardiologist:  Minus Breeding, MD  Chief Complaint    67 year old female with a history of paroxysmal atrial fibrillation (not on anticoagulation), hypertension, bradycardia, aortic atherosclerosis with negative stress test in 2017, OSA on CPAP, hiatal hernia, GERD, Schatzki's ring, cholecystitis s/p cholecystectomy in 03/2022, ERCP with stone extraction in 04/2022, and diverticulosis who presents for follow-up related to atrial fibrillation.  Past Medical History    Past Medical History:  Diagnosis Date   Allergic rhinitis    Anticoagulant long-term use    XARELTO   ANXIETY    Dysrhythmia    hx of atrial fib x 2 per pt   Endometrial polyp    Endometrial polyp    Fatty liver 06/23/2007   GERD    Herniated disc, 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   History of esophageal stricture    mainly due to gerd   History of exercise stress test 06/13/2015   PER DR HOCHREIN NOTE -- NO EVIDENCE ISCHEMIA   HYPERTENSION    Impaired fasting glucose    Migraine    hx of migraines none recent   OSA on CPAP followed by dr Halford Chessman   per study 02-09-2012  severe osa w/ AHI 33.8   Paroxysmal atrial fibrillation ([place]) cardioloigst-  dr hochrein   dx 02-21-2016 at ED   Pneumonia    with COVID   PONV (postoperative nausea and vomiting)    after surgery in 10/2021- had nausea after surgery related to preop drink   Pre-diabetes    Pseudotumor cerebri 2007   S/P cervical spinal fusion 10/24/2021   Sleep apnea    Vitamin D deficiency 07/2013   Past Surgical History:  Procedure Laterality Date   ABDOMINAL HYSTERECTOMY     ANTERIOR CERVICAL DECOMPRESSION/DISCECTOMY FUSION 4 LEVELS N/A 10/24/2021   Procedure: ANTERIOR CERVICAL DECOMPRESSION FUSION CERVICAL FOUR THROUGH CERVICAL FIVE, CERVICAL FIVE THROUGH CERVICAL SIX, CERVICAL SIX THROUGH CERVICAL SEVEN WITH INSTRUMENTATION  AND ALLOGRAFT;  Surgeon: Phylliss Bob, MD;  Location: [place];  Service: Orthopedics;  Laterality: N/A;   BALLOON DILATION N/A 04/11/2022   Procedure: BALLOON DILATION;  Surgeon: Jackquline Denmark, MD;  [facility];  Service: Gastroenterology;  Laterality: N/A;   BIOPSY  04/11/2022   Procedure: BIOPSY;  Surgeon: Jackquline Denmark, MD;  [facility];  Service: Gastroenterology;;   BREAST BIOPSY Right 08/12/2019    FIBROADENOMA    CHOLECYSTECTOMY N/A 04/10/2022   Procedure: LAPAROSCOPIC CHOLECYSTECTOMY WITH INTRAOPERATIVE CHOLANGIOGRAM;  Surgeon: Mickeal Skinner, MD;  [facility];  Service: General;  Laterality: N/A;   COLONOSCOPY     colonscopy  2007   normal   DILATATION & CURETTAGE/HYSTEROSCOPY WITH MYOSURE N/A 03/26/2017   Procedure: DILATATION & CURETTAGE/HYSTEROSCOPY WITH MYOSURE;  Surgeon: Paula Compton, MD;  Location: [place];  Service: Gynecology;  Laterality: N/A;   DILATION AND CURETTAGE OF UTERUS  2010   ERCP N/A 04/11/2022   Procedure: ENDOSCOPIC RETROGRADE CHOLANGIOPANCREATOGRAPHY (ERCP);  Surgeon: Jackquline Denmark, MD;  Location[facility];  Service: Gastroenterology;  Laterality: N/A;   ESOPHAGOGASTRODUODENOSCOPY N/A 04/11/2022   Procedure: ESOPHAGOGASTRODUODENOSCOPY (EGD);  Surgeon: Jackquline Denmark, MD;  Location: [facility];  Service: Gastroenterology;  Laterality: N/A;   FRACTURE SURGERY     LAPAROSCOPIC HYSTERECTOMY Bilateral 06/18/2017   Procedure: HYSTERECTOMY TOTAL LAPAROSCOPIC;  Surgeon: Paula Compton, MD;  Location: [place];  Service: Gynecology;  Laterality: Bilateral;  OUT PT IN BED, needs extra time MD request 2.5hrs in OR   REMOVAL OF STONES  04/11/2022   Procedure: REMOVAL OF STONES;  Surgeon: Jackquline Denmark, MD;  [facility];  Service: Gastroenterology;;   right knee arthroscopy  03/2013   SPHINCTEROTOMY  04/11/2022   Procedure: Joan Mayans;  Surgeon: Jackquline Denmark, MD;  [facility];  Service: Gastroenterology;;   TONSILLECTOMY Bilateral 01/10/2022   Procedure: TONSILLECTOMY;  Surgeon: Izora Gala, MD;  Location: [place];  Service: ENT;  Laterality: Bilateral;   torn meniscus surgery on right knee      TRANSTHORACIC ECHOCARDIOGRAM  03/11/2016   ef 60-65%/  mild MR/ trivial PR and TR   TUBAL LIGATION     UPPER GASTROINTESTINAL ENDOSCOPY  11/2020    Allergies  Allergies  Allergen Reactions   Cefaclor Rash and Other (See Comments)   Cipro [Ciprofloxacin Hcl] Rash    Hands were red, skin was red    Codeine Nausea And Vomiting   Hydrocodone Nausea And Vomiting    Severe vomiting/  COUGH SYRUP CAUSE SEVERE VOMITING   Levaquin [Levofloxacin] Nausea Only   Nexium [Esomeprazole Magnesium] Other (See Comments)    Made legs ache   Penicillins Rash   Sulfa Antibiotics Rash     Labs/Other Studies Reviewed    The following studies were reviewed today: 3-day Zio 03/2022: Normal sinus rhythm Infrequent runs of SVT with the longest rn being 13 beats No sustained arrhythmias.  Echo 03/2022: IMPRESSIONS    1. Left ventricular ejection fraction, by estimation, is 60 to 65%. The  left ventricle has normal function. The left ventricle has no regional  wall motion abnormalities. Left ventricular diastolic parameters are  indeterminate.   2. Right ventricular systolic function is normal. The right ventricular  size is normal. There is normal pulmonary artery systolic pressure.   3. No evidence of mitral valve regurgitation.   4. Aortic valve regurgitation is not visualized.   5. The inferior vena cava is normal in size with greater than 50%  respiratory variability, suggesting right atrial pressure of 3 mmHg.   Comparison(s): No significant change from prior study.   Recent Labs: 03/13/2022: Magnesium 2.0 03/25/2022: TSH 3.564 04/13/2022: Hemoglobin 11.5; Platelets 186 04/21/2022: BUN 15; Creatinine, Ser 0.84; Potassium 4.0; Sodium 142 05/16/2022: ALT 30   Recent Lipid Panel    Component Value Date/Time   CHOL 164 04/01/2021 1555   TRIG 114.0 04/01/2021 1555   HDL 43.70 04/01/2021 1555   CHOLHDL 4 04/01/2021 1555   VLDL 22.8 04/01/2021 1555   LDLCALC 97 04/01/2021 1555    History of Present Illness    67 year old female with the above past medical history including paroxysmal atrial fibrillation (not on anticoagulation), hypertension, bradycardia, aortic atherosclerosis with negative stress test in 2017, OSA on CPAP, hiatal hernia, GERD, Schatzki's ring, cholecystitis s/p cholecystectomy in 03/2022, ERCP with stone extraction in 04/2022, and diverticulosis.  She has a history of cardiomegaly as well as aortic atherosclerosis noted on prior chest x-ray.  ETT in 2017 showed no evidence of ischemia.  She was hospitalized in 02/2016 in the setting of new onset atrial fibrillation.  She was started on Xarelto.  This was eventually discontinued in the setting of no recurrent atrial fibrillation.  He was evaluated in the ED in 03/2022 in the setting of dizziness.  Cardiac enzymes were unremarkable, EKG was unremarkable.  She was bradycardic and hypotensive upon arrival.  It was thought she possibly had a vasovagal episode related  to biliary cholelithiasis.  She was last seen in the office on 03/26/2022 and was stable from a cardiac standpoint.  She denies any further presyncope, syncope.  3-day Zio patch in 03/2022 showed normal sinus rhythm, and infrequent runs of SVT, longest run lasting 13 beats, no sustained arrhythmia.  Echocardiogram at the time showed EF 60 to 65%, normal LV function, no RWMA, indeterminate diastolic parameters, normal RV systolic function, no significant valvular abnormalities.  She was hospitalized in 03/2022 in the setting of acute cholecystitis, bile duct stones, s/p lap chole, ERCP.  She presents today for follow-up.  Since her last visit and since her hospitalization she has done well from a cardiac standpoint.  She denies  any recurrent presyncope, syncope, denies palpitations, dizziness, chest pain or dyspnea.  BP has been well-controlled.  Overall, she reports feeling well.   Home Medications    Current Outpatient Medications  Medication Sig Dispense Refill   amLODipine (NORVASC) 2.5 MG tablet Take 1 tablet (2.5 mg total) by mouth daily. 90 tablet 3   cetirizine (ZYRTEC) 10 MG tablet Take 10 mg by mouth daily.     Cholecalciferol (VITAMIN D) 125 MCG (5000 UT) CAPS Take 5,000 Units by mouth daily.     clotrimazole-betamethasone (LOTRISONE) cream Apply 1 Application topically 2 (two) times daily. 100 g 3   famotidine (PEPCID) 40 MG tablet TAKE 1 TABLET BY MOUTH DAILY BEFORE BREAKFAST 90 tablet 1   fluticasone (FLONASE) 50 MCG/ACT nasal spray Place 2 sprays into both nostrils daily. (Patient taking differently: Place 2 sprays into both nostrils daily as needed for allergies.) 48 g 6   Wheat Dextrin (BENEFIBER DRINK MIX PO) Take by mouth. (Patient not taking: Reported on 06/26/2022)     No current facility-administered medications for this visit.     Review of Systems    She denies chest pain, palpitations, dyspnea, pnd, orthopnea, n, v, dizziness, syncope, edema, weight gain, or early satiety. All other systems reviewed and are otherwise negative except as noted above.   Physical Exam    VS:  BP 138/74   Pulse (!) 58   Ht 5' 6"$  (1.676 m)   Wt 220 lb 12.8 oz (100.2 kg)   SpO2 98%   BMI 35.64 kg/m   GEN: Well nourished, well developed, in no acute distress. HEENT: normal. Neck: Supple, no JVD, carotid bruits, or masses. Cardiac: RRR, no murmurs, rubs, or gallops. No clubbing, cyanosis, edema.  Radials/DP/PT 2+ and equal bilaterally.  Respiratory:  Respirations regular and unlabored, clear to auscultation bilaterally. GI: Soft, nontender, nondistended, BS + x 4. MS: no deformity or atrophy. Skin: warm and dry, no rash. Neuro:  Strength and sensation are intact. Psych: Normal affect.  Accessory  Clinical Findings    ECG personally reviewed by me today - No EKG in office today. Lab Results  Component Value Date   WBC 5.6 04/13/2022   HGB 11.5 (L) 04/13/2022   HCT 35.9 (L) 04/13/2022   MCV 89.8 04/13/2022   PLT 186 04/13/2022   Lab Results  Component Value Date   CREATININE 0.84 04/21/2022   BUN 15 04/21/2022   NA 142 04/21/2022   K 4.0 04/21/2022   CL 106 04/21/2022   CO2 29 04/21/2022   Lab Results  Component Value Date   ALT 30 05/16/2022   AST 20 05/16/2022   ALKPHOS 88 05/16/2022   BILITOT 0.8 05/16/2022   Lab Results  Component Value Date   CHOL 164 04/01/2021   HDL 43.70  04/01/2021   LDLCALC 97 04/01/2021   TRIG 114.0 04/01/2021   CHOLHDL 4 04/01/2021    Lab Results  Component Value Date   HGBA1C 6.0 12/26/2021    Assessment & Plan    1. Paroxysmal atrial fibrillation: Maintaining NSR.  Denies any recent palpitations.  CHA2DS2VASc = 2. Not on anticoagulation per MD given low  a fib burden.  2. Bradycardia: HR stable at 58 bpm.  She is not on beta-blocker therapy.  3. Dizziness/presyncope: Likely occurred in the setting of acute cholecystitis, possibly vasovagal and in the setting of resting bradycardia.  3-day monitor showed sinus rhythm, and frequent runs of SVT.  She denies any recurrent syncope, syncope.  Stable.  4. Hypertension: BP well controlled. Continue current antihypertensive regimen.    5. Aortic atherosclerosis: Stress test in 2017 was negative for ischemia.  Stable with no anginal symptoms.  6. OSA: Adherent to CPAP.   7. Disposition: Follow-up in 4-6 months with Dr. [name].      Lenna Sciara, NP 06/26/2022, 5:56 PM

## 2022-06-26 NOTE — Patient Instructions (Signed)
Medication Instructions:  Your physician recommends that you continue on your current medications as directed. Please refer to the Current Medication list given to you today.   *If you need a refill on your cardiac medications before your next appointment, please call your pharmacy*   Lab Work: NONE ordered at this time of appointment   If you have labs (blood work) drawn today and your tests are completely normal, you will receive your results only by: [place] (if you have MyChart) OR A paper copy in the mail If you have any lab test that is abnormal or we need to change your treatment, we will call you to review the results.   Testing/Procedures: NONE ordered at this time of appointment     Follow-Up: At [facility], you and your health needs are our priority.  As part of our continuing mission to provide you with exceptional heart care, we have created designated Provider Care Teams.  These Care Teams include your primary Cardiologist (physician) and Advanced Practice Providers (APPs -  Physician Assistants and Nurse Practitioners) who all work together to provide you with the care you need, when you need it.  We recommend signing up for the patient portal called "MyChart".  Sign up information is provided on this After Visit Summary.  MyChart is used to connect with patients for Virtual Visits (Telemedicine).  Patients are able to view lab/test results, encounter notes, upcoming appointments, etc.  Non-urgent messages can be sent to your provider as well.   To learn more about what you can do with MyChart, go to [email or web address].    Your next appointment:   4-6 month(s)  Provider:   Minus Breeding, MD     Other Instructions

## 2022-06-27 ENCOUNTER — Telehealth: Payer: Self-pay | Admitting: Cardiology

## 2022-06-27 NOTE — Telephone Encounter (Signed)
Left message for pt to call.

## 2022-06-27 NOTE — Telephone Encounter (Signed)
Spoke with pt, aortic arthrosclerosis discussed in detail and all questions answered.

## 2022-06-27 NOTE — Telephone Encounter (Signed)
Spoke with patient of Dr [name], saw Shannon Sarna NP yesterday. She is concerned that aortic atherosclerosis is listed on her chart/diagnoses. Explained that note indicates this was on a prior CXR - unable to locate in EPIC. She said she read this can mean she has congestive heart failure ad is wondering what else needs to be done. Explained at aortic atherosclerosis does not mean she has CHF. She said her aortic valve on echo is fine - explained that aortic atherosclerosis is stiffening of the aorta not the valve. She is really worried that this is in her record and would like clarification. She did say she noticed it also after her last visit with Dr. [name]  Routed to [facility] and Dr. [name] for review/clarification

## 2022-06-27 NOTE — Telephone Encounter (Signed)
Pt would like a callback regarding diagnosis on AVS from yesterday. Pt has concerns and thinks it may be a mistake. Please advise

## 2022-07-04 ENCOUNTER — Other Ambulatory Visit: Payer: Self-pay | Admitting: Internal Medicine

## 2022-07-04 DIAGNOSIS — Z1231 Encounter for screening mammogram for malignant neoplasm of breast: Secondary | ICD-10-CM

## 2022-08-12 ENCOUNTER — Encounter: Payer: Self-pay | Admitting: Internal Medicine

## 2022-08-12 ENCOUNTER — Ambulatory Visit (INDEPENDENT_AMBULATORY_CARE_PROVIDER_SITE_OTHER): Payer: Medicare Other | Admitting: Internal Medicine

## 2022-08-12 VITALS — BP 136/60 | HR 41 | Temp 98.0°F | Ht 66.0 in | Wt 223.5 lb

## 2022-08-12 DIAGNOSIS — E559 Vitamin D deficiency, unspecified: Secondary | ICD-10-CM | POA: Diagnosis not present

## 2022-08-12 DIAGNOSIS — E2839 Other primary ovarian failure: Secondary | ICD-10-CM

## 2022-08-12 DIAGNOSIS — I10 Essential (primary) hypertension: Secondary | ICD-10-CM

## 2022-08-12 LAB — COMPREHENSIVE METABOLIC PANEL
ALT: 16 U/L (ref 0–35)
AST: 16 U/L (ref 0–37)
Albumin: 4.3 g/dL (ref 3.5–5.2)
Alkaline Phosphatase: 78 U/L (ref 39–117)
BUN: 16 mg/dL (ref 6–23)
CO2: 31 mEq/L (ref 19–32)
Calcium: 9.7 mg/dL (ref 8.4–10.5)
Chloride: 105 mEq/L (ref 96–112)
Creatinine, Ser: 0.8 mg/dL (ref 0.40–1.20)
GFR: 77.29 mL/min (ref >60.00)
Glucose, Bld: 95 mg/dL (ref 70–99)
Potassium: 4.1 mEq/L (ref 3.5–5.1)
Sodium: 142 mEq/L (ref 135–145)
Total Bilirubin: 1.1 mg/dL (ref 0.2–1.2)
Total Protein: 7.3 g/dL (ref 6.0–8.3)

## 2022-08-12 LAB — CK: Total CK: 38 U/L (ref 7–177)

## 2022-08-12 LAB — VITAMIN D 25 HYDROXY (VIT D DEFICIENCY, FRACTURES): VITD: 57.67 ng/mL (ref 30.00–100.00)

## 2022-08-12 LAB — CBC
HCT: 43.8 % (ref 36.0–46.0)
Hemoglobin: 14.5 g/dL (ref 12.0–15.0)
MCHC: 33 g/dL (ref 30.0–36.0)
MCV: 86.5 fl (ref 78.0–100.0)
Platelets: 247 10*3/uL (ref 150.0–400.0)
RBC: 5.06 Mil/uL (ref 3.87–5.11)
RDW: 13.9 % (ref 11.5–15.5)
WBC: 5.5 10*3/uL (ref 4.0–10.5)

## 2022-08-12 LAB — MAGNESIUM: Magnesium: 2 mg/dL (ref 1.5–2.5)

## 2022-08-12 NOTE — Assessment & Plan Note (Signed)
With new myalgias needs CMP and magnesium and CK. Not taking any medication which should impact potassium but with low potassium in the past.

## 2022-08-12 NOTE — Assessment & Plan Note (Signed)
Checking vitamin D due to new muscle aches. Adjust as needed.

## 2022-08-12 NOTE — Progress Notes (Signed)
   Subjective:   Patient ID: Shannon Navarro, female    DOB: January 15, 1956, 67 y.o.   MRN: [id number]  HPI The patient is a 67 YO female coming in for muscle cramps. Some spasm in her hands. No change in diet or exercise.   Review of Systems  Constitutional: Negative.   HENT: Negative.    Eyes: Negative.   Respiratory:  Negative for cough, chest tightness and shortness of breath.   Cardiovascular:  Negative for chest pain, palpitations and leg swelling.  Gastrointestinal:  Negative for abdominal distention, abdominal pain, constipation, diarrhea, nausea and vomiting.  Musculoskeletal:  Positive for myalgias.  Skin: Negative.   Neurological: Negative.   Psychiatric/Behavioral: Negative.      Objective:  Physical Exam Constitutional:      Appearance: She is well-developed. She is obese.  HENT:     Head: Normocephalic and atraumatic.  Cardiovascular:     Rate and Rhythm: Normal rate and regular rhythm.  Pulmonary:     Effort: Pulmonary effort is normal. No respiratory distress.     Breath sounds: Normal breath sounds. No wheezing or rales.  Abdominal:     General: Bowel sounds are normal. There is no distension.     Palpations: Abdomen is soft.     Tenderness: There is no abdominal tenderness. There is no rebound.  Musculoskeletal:     Cervical back: Normal range of motion.  Skin:    General: Skin is warm and dry.  Neurological:     Mental Status: She is alert and oriented to person, place, and time.     Coordination: Coordination normal.     Vitals:   08/12/22 0845  BP: 136/60  Pulse: (!) 41  Temp: 98 F (36.7 C)  TempSrc: Oral  SpO2: 99%  Weight: 223 lb 8 oz (101.4 kg)  Height: 5\' 6"  (1.676 m)    Assessment & Plan:

## 2022-08-12 NOTE — Patient Instructions (Signed)
We will check the labs today and schedule the bone density.

## 2022-08-13 ENCOUNTER — Ambulatory Visit (INDEPENDENT_AMBULATORY_CARE_PROVIDER_SITE_OTHER)
Admission: RE | Admit: 2022-08-13 | Discharge: 2022-08-13 | Disposition: A | Discharge status: Home / Self Care | Payer: Medicare Other | Source: Ambulatory Visit | Attending: Internal Medicine | Admitting: Internal Medicine

## 2022-08-13 DIAGNOSIS — E2839 Other primary ovarian failure: Secondary | ICD-10-CM

## 2022-08-18 ENCOUNTER — Encounter: Payer: Self-pay | Admitting: Internal Medicine

## 2022-08-18 ENCOUNTER — Ambulatory Visit
Admission: RE | Admit: 2022-08-18 | Discharge: 2022-08-18 | Disposition: A | Discharge status: Home / Self Care | Payer: Medicare Other | Source: Ambulatory Visit | Attending: Internal Medicine | Admitting: Internal Medicine

## 2022-08-18 DIAGNOSIS — Z1231 Encounter for screening mammogram for malignant neoplasm of breast: Secondary | ICD-10-CM

## 2022-08-18 LAB — HM DEXA SCAN: HM Dexa Scan: 2.1

## 2022-09-10 ENCOUNTER — Ambulatory Visit: Payer: Medicare Other | Admitting: Podiatry

## 2022-09-10 ENCOUNTER — Ambulatory Visit (INDEPENDENT_AMBULATORY_CARE_PROVIDER_SITE_OTHER): Payer: Medicare Other

## 2022-09-10 ENCOUNTER — Encounter: Payer: Self-pay | Admitting: Podiatry

## 2022-09-10 ENCOUNTER — Other Ambulatory Visit: Payer: Self-pay | Admitting: Podiatry

## 2022-09-10 ENCOUNTER — Ambulatory Visit (INDEPENDENT_AMBULATORY_CARE_PROVIDER_SITE_OTHER): Payer: Medicare Other | Admitting: Podiatry

## 2022-09-10 DIAGNOSIS — M722 Plantar fascial fibromatosis: Secondary | ICD-10-CM | POA: Diagnosis not present

## 2022-09-10 DIAGNOSIS — M67472 Ganglion, left ankle and foot: Secondary | ICD-10-CM

## 2022-09-10 MED ORDER — TRIAMCINOLONE ACETONIDE 40 MG/ML IJ SUSP
40.0000 mg | Freq: Once | INTRAMUSCULAR | Status: AC
Start: 2022-09-10 — End: 2022-09-10
  Administered 2022-09-10: 40 mg

## 2022-09-10 NOTE — Progress Notes (Signed)
She presents today for chief complaint of pain to her her left heel and her medial arch right.  Objective: Vital signs are stable she is alert and oriented x 3.  Pulses are palpable.  She has a small plantar fibroma on palpation of the distal medial aspect of the right plantar fascia.  And she has pain on palpation MucoClear tubercle of the left heel.  Radiographs taken today confirm soft tissue increase in density plantar Sclafani insertion site of the left heel.  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  Plan: I injected both of the sites today discussed appropriate shoe gear stretching excise ice therapy sugar modifications follow-up with her 2 months

## 2022-09-10 NOTE — Patient Instructions (Signed)

## 2022-09-23 ENCOUNTER — Encounter (HOSPITAL_COMMUNITY): Payer: Self-pay

## 2022-09-23 ENCOUNTER — Emergency Department (HOSPITAL_COMMUNITY)
Admission: EM | Admit: 2022-09-23 | Discharge: 2022-09-23 | Disposition: A | Discharge status: Home / Self Care | Payer: Medicare Other | Attending: Emergency Medicine | Admitting: Emergency Medicine

## 2022-09-23 ENCOUNTER — Emergency Department (HOSPITAL_COMMUNITY): Payer: Medicare Other

## 2022-09-23 ENCOUNTER — Other Ambulatory Visit: Payer: Self-pay

## 2022-09-23 DIAGNOSIS — R000 Tachycardia, unspecified: Secondary | ICD-10-CM | POA: Diagnosis present

## 2022-09-23 DIAGNOSIS — I4891 Unspecified atrial fibrillation: Secondary | ICD-10-CM

## 2022-09-23 LAB — CBC
HCT: 45.8 % (ref 36.0–46.0)
Hemoglobin: 14.8 g/dL (ref 12.0–15.0)
MCH: 28.1 pg (ref 26.0–34.0)
MCHC: 32.3 g/dL (ref 30.0–36.0)
MCV: 86.9 fL (ref 80.0–100.0)
Platelets: 243 10*3/uL (ref 150–400)
RBC: 5.27 MIL/uL — ABNORMAL HIGH (ref 3.87–5.11)
RDW: 13 % (ref 11.5–15.5)
WBC: 5.9 10*3/uL (ref 4.0–10.5)
nRBC: 0 % (ref 0.0–0.2)

## 2022-09-23 LAB — TROPONIN I (HIGH SENSITIVITY)
Troponin I (High Sensitivity): 3 ng/L (ref <18)
Troponin I (High Sensitivity): 3 ng/L (ref <18)

## 2022-09-23 LAB — BASIC METABOLIC PANEL
Anion gap: 11 (ref 5–15)
BUN: 17 mg/dL (ref 8–23)
CO2: 24 mmol/L (ref 22–32)
Calcium: 9.4 mg/dL (ref 8.9–10.3)
Chloride: 106 mmol/L (ref 98–111)
Creatinine, Ser: 0.74 mg/dL (ref 0.44–1.00)
GFR, Estimated: >60 mL/min (ref >60)
Glucose, Bld: 89 mg/dL (ref 70–99)
Potassium: 3.6 mmol/L (ref 3.5–5.1)
Sodium: 141 mmol/L (ref 135–145)

## 2022-09-23 LAB — TSH: TSH: 1.489 u[IU]/mL (ref 0.350–4.500)

## 2022-09-23 MED ORDER — METOPROLOL TARTRATE 5 MG/5ML IV SOLN
5.0000 mg | Freq: Once | INTRAVENOUS | Status: AC
  Administered 2022-09-23: 5 mg via INTRAVENOUS
  Filled 2022-09-23: qty 5

## 2022-09-23 MED ORDER — LACTATED RINGERS IV SOLN: INTRAVENOUS | Status: DC

## 2022-09-23 NOTE — ED Notes (Signed)
Pt ambulatory to bathroom

## 2022-09-23 NOTE — ED Triage Notes (Signed)
Pt c/o SOB and palpitations and tachycardia, pressure in chest today. Pt states her smart watch showed she her HR was 140

## 2022-09-23 NOTE — Discharge Instructions (Signed)
Follow-up with your physician for your irregular heartbeat

## 2022-09-23 NOTE — ED Provider Notes (Signed)
[place] EMERGENCY DEPARTMENT AT [facility] Provider Note   CSN: [id number] Arrival date & time: 09/23/22  1133     History  Chief Complaint  Patient presents with   Shortness of Breath   Tachycardia    LYNNLEE KAGARISE is a 67 y.o. female.  67 year old female presents with sudden onset of palpitations and tachycardia.  Has a history of paroxysmal A-fib.  Has been prescribed propranolol to take as needed.  She did not take any today.  Denies any recent illnesses.  Has no chest pain or shortness of breath.  Does not take any blood thinners       Home Medications Prior to Admission medications   Medication Sig Start Date End Date Taking? Authorizing Provider  amLODipine (NORVASC) 2.5 MG tablet Take 1 tablet (2.5 mg total) by mouth daily. 04/21/22   Myrlene Broker, MD  cetirizine (ZYRTEC) 10 MG tablet Take 10 mg by mouth daily.    [provider]  Cholecalciferol (VITAMIN D) 125 MCG (5000 UT) CAPS Take 5,000 Units by mouth daily.    [provider]  clotrimazole-betamethasone (LOTRISONE) cream Apply 1 Application topically 2 (two) times daily. 04/21/22   Myrlene Broker, MD  famotidine (PEPCID) 40 MG tablet TAKE 1 TABLET BY MOUTH DAILY BEFORE BREAKFAST 06/02/22   Esterwood, Amy S, PA-C  fluticasone (FLONASE) 50 MCG/ACT nasal spray Place 2 sprays into both nostrils daily. Patient taking differently: Place 2 sprays into both nostrils daily as needed for allergies. 02/18/21   Myrlene Broker, MD  Wheat Dextrin (BENEFIBER DRINK MIX PO) Take by mouth.    [provider]      Allergies    Cefaclor, Cipro [ciprofloxacin hcl], Codeine, Hydrocodone, Levaquin [levofloxacin], Nexium [esomeprazole magnesium], Penicillins, and Sulfa antibiotics    Review of Systems   Review of Systems  All other systems reviewed and are negative.   Physical Exam Updated Vital Signs BP (!) 173/109   Pulse (!) 43   Temp 98 F (36.7 C)   Resp  15   Ht 1.676 m (5\' 6" )   Wt 101.4 kg   SpO2 100%   BMI 36.08 kg/m  Physical Exam Vitals and nursing note reviewed.  Constitutional:      General: She is not in acute distress.    Appearance: Normal appearance. She is well-developed. She is not toxic-appearing.  HENT:     Head: Normocephalic and atraumatic.  Eyes:     General: Lids are normal.     Conjunctiva/sclera: Conjunctivae normal.     Pupils: Pupils are equal, round, and reactive to light.  Neck:     Thyroid: No thyroid mass.     Trachea: No tracheal deviation.  Cardiovascular:     Rate and Rhythm: Tachycardia present. Rhythm irregular.     Heart sounds: Normal heart sounds. No murmur heard.    No gallop.  Pulmonary:     Effort: Pulmonary effort is normal. No respiratory distress.     Breath sounds: Normal breath sounds. No stridor. No decreased breath sounds, wheezing, rhonchi or rales.  Abdominal:     General: There is no distension.     Palpations: Abdomen is soft.     Tenderness: There is no abdominal tenderness. There is no rebound.  Musculoskeletal:        General: No tenderness. Normal range of motion.     Cervical back: Normal range of motion and neck supple.  Skin:    General: Skin is warm  and dry.     Findings: No abrasion or rash.  Neurological:     Mental Status: She is alert and oriented to person, place, and time. Mental status is at baseline.     GCS: GCS eye subscore is 4. GCS verbal subscore is 5. GCS motor subscore is 6.     Cranial Nerves: No cranial nerve deficit.     Sensory: No sensory deficit.     Motor: Motor function is intact.  Psychiatric:        Attention and Perception: Attention normal.        Speech: Speech normal.        Behavior: Behavior normal.     ED Results / Procedures / Treatments   Labs (all labs ordered are listed, but only abnormal results are displayed) Labs Reviewed  BASIC METABOLIC PANEL  CBC  TSH  TROPONIN I (HIGH SENSITIVITY)    EKG EKG  Interpretation  Date/Time:  Tuesday Sep 23 2022 11:42:52 EDT Ventricular Rate:  148 PR Interval:    QRS Duration: 74 QT Interval:  312 QTC Calculation: 489 R Axis:   30 Text Interpretation: Atrial fibrillation with rapid ventricular response Cannot rule out Anterior infarct , age undetermined Abnormal ECG When compared with ECG of 09-Apr-2022 01:25, PREVIOUS ECG IS PRESENT Confirmed by Lorre Nick ([id number]) on 09/23/2022 11:50:49 AM  Radiology No results found.  Procedures Procedures    Medications Ordered in ED Medications  lactated ringers infusion (has no administration in time range)  metoprolol tartrate (LOPRESSOR) injection 5 mg (has no administration in time range)    ED Course/ Medical Decision Making/ A&P                             Medical Decision Making Amount and/or Complexity of Data Reviewed Labs: ordered. Radiology: ordered.  Risk Prescription drug management.   Patient is EKG per interpretation shows A-fib with RVR.  Patient symptoms began just prior to arrival here.  Given Lopressor 5 mg IV push and she is now back to her baseline sinus bradycardia.  Labs here are reassuring.  Do not feel that she needs anticoagulation at this time.  Patient was monitored here and no return to A-fib.  Will follow-up with her doctor as needed  CRITICAL CARE Performed by: Toy Baker Total critical care time: 50 minutes Critical care time was exclusive of separately billable procedures and treating other patients. Critical care was necessary to treat or prevent imminent or life-threatening deterioration. Critical care was time spent personally by me on the following activities: development of treatment plan with patient and/or surrogate as well as nursing, discussions with consultants, evaluation of patient's response to treatment, examination of patient, obtaining history from patient or surrogate, ordering and performing treatments and interventions, ordering and  review of laboratory studies, ordering and review of radiographic studies, pulse oximetry and re-evaluation of patient's condition.         Final Clinical Impression(s) / ED Diagnoses Final diagnoses:  None    Rx / DC Orders ED Discharge Orders     None         Lorre Nick, MD 09/23/22 1338

## 2022-09-24 ENCOUNTER — Telehealth: Payer: Self-pay

## 2022-09-24 NOTE — Transitions of Care (Post Inpatient/ED Visit) (Signed)
   09/24/2022  Name: Shannon Navarro MRN: [id number] DOB: 21-Feb-1956  Today's TOC FU Call Status: Today's TOC FU Call Status:: Successful TOC FU Call Competed TOC FU Call Complete Date: 09/24/22  Transition Care Management Follow-up Telephone Call Date of Discharge: 09/23/22 Discharge Facility[facility]) Type of Discharge: Emergency Department Reason for ED Visit: Other: (afib) How have you been since you were released from the hospital?: Better Any questions or concerns?: No  Items Reviewed: Did you receive and understand the discharge instructions provided?: Yes Medications obtained,verified, and reconciled?: Yes (Medications Reviewed) Any new allergies since your discharge?: No Dietary orders reviewed?: Yes Do you have support at home?: Yes People in Home: spouse  Medications Reviewed Today: Medications Reviewed Today     Reviewed by Karena Addison, LPN (Licensed Practical Nurse) on 09/24/22 at 1234  Med List Status: <None>   Medication Order Taking? Sig Documenting Provider Last Dose Status Informant  amLODipine (NORVASC) 2.5 MG tablet [id number] Yes Take 1 tablet (2.5 mg total) by mouth daily. Myrlene Broker, MD Taking Active   cetirizine (ZYRTEC) 10 MG tablet [id number] Yes Take 10 mg by mouth daily. [provider] Taking Active Self  Cholecalciferol (VITAMIN D) 125 MCG (5000 UT) CAPS [id number] Yes Take 5,000 Units by mouth daily. [provider] Taking Active Self  clotrimazole-betamethasone (LOTRISONE) cream [id number] Yes Apply 1 Application topically 2 (two) times daily. Myrlene Broker, MD Taking Active   famotidine (PEPCID) 40 MG tablet [id number] Yes TAKE 1 TABLET BY MOUTH DAILY BEFORE BREAKFAST Esterwood, Amy S, PA-C Taking Active   fluticasone (FLONASE) 50 MCG/ACT nasal spray [id number] Yes Place 2 sprays into both nostrils daily.  Patient taking differently: Place 2 sprays into both nostrils daily as needed for allergies.    Myrlene Broker, MD Taking Active Self           Med Note Armandina Stammer Oct 16, 2021  9:44 AM)    Jiles Garter Dextrin (BENEFIBER DRINK MIX PO) [id number] Yes Take by mouth. [provider] Taking Active             Home Care and Equipment/Supplies: Were Home Health Services Ordered?: NA Any new equipment or medical supplies ordered?: NA  Functional Questionnaire: Do you need assistance with bathing/showering or dressing?: No Do you need assistance with meal preparation?: No Do you need assistance with eating?: No Do you have difficulty maintaining continence: No Do you need assistance with getting out of bed/getting out of a chair/moving?: No Do you have difficulty managing or taking your medications?: No  Follow up appointments reviewed: PCP Follow-up appointment confirmed?: NA Specialist Hospital Follow-up appointment confirmed?: NA Do you need transportation to your follow-up appointment?: No Do you understand care options if your condition(s) worsen?: Yes-patient verbalized understanding    SIGNATURE Karena Addison, LPN [facility] Nurse Health Advisor Direct Dial [phone]

## 2022-11-06 ENCOUNTER — Ambulatory Visit: Payer: Medicare Other | Admitting: Podiatry

## 2022-11-12 ENCOUNTER — Ambulatory Visit (INDEPENDENT_AMBULATORY_CARE_PROVIDER_SITE_OTHER): Payer: Medicare Other | Admitting: Podiatry

## 2022-11-12 ENCOUNTER — Encounter: Payer: Self-pay | Admitting: Podiatry

## 2022-11-12 DIAGNOSIS — M722 Plantar fascial fibromatosis: Secondary | ICD-10-CM | POA: Diagnosis not present

## 2022-11-12 MED ORDER — TRIAMCINOLONE ACETONIDE 40 MG/ML IJ SUSP
20.0000 mg | Freq: Once | INTRAMUSCULAR | Status: AC
Start: 2022-11-12 — End: 2022-11-12
  Administered 2022-11-12: 20 mg

## 2022-11-12 NOTE — Progress Notes (Signed)
She presents today for follow-up of her Planter fasciitis states that the right was doing pretty well the left 1 feels like it pulls every time a squat or bend over or do any kind of stretching.  She is pointing to the plantar lateral aspect of the left foot.  Objective: Vital signs are stable alert and oriented x 3.  Pulses are palpable.  She no longer has pain on palpation of the right foot nor does she have pain on palpation to the medial aspect of the calcaneal insertion site of the plantar fascia.  She does have some tenderness on the lateral tubercle secondary to plantar fascial inflammation.  Assessment: Resolving Planter fasciitis right foot and medial band left foot.  Still has some Planter fasciitis lateral band left foot.  Plan: I injected that area today with 10 mg Kenalog 5 mg Marcaine point of maximal tenderness.  I will follow-up with her in 2 months if necessary.

## 2022-12-19 ENCOUNTER — Telehealth: Payer: Self-pay | Admitting: Cardiology

## 2022-12-19 NOTE — Telephone Encounter (Signed)
Pt c/o BP issue: STAT if pt c/o blurred vision, one-sided weakness or slurred speech  1. What are your last 5 BP readings? Earlier 7:30 AM 179/99 8:15 AM this morning 155/103 HR 86 (While we were on the phone)160/119 HR 91  2. Are you having any other symptoms (ex. Dizziness, headache, blurred vision, passed out)? Headache  3. What is your BP issue? This morning the patient noticed she was in afib and checked her BP. Patient states that's when she noticed she was in Afib. Please advise.

## 2022-12-19 NOTE — Telephone Encounter (Signed)
Asked patient for BP readings and she discusses plantar fascitis and taking prednisone.  States unable to sleep and gaining fluid. She states notally averages BP 120/50-60 range.  She states that she has increased BP this am   This reading is before medications and she took them all very close together.  She noted Headache and felt her wrist and that she was in Afib  so caused her to take BP which was 179/99  with HR 86 . Taken with arm cuff. States BP elevated a few days but last reading 2-3 days ago was 134/74.   Takes amlodipine 2.5 mg daily.  She states taking prednisone 20 mg daily for about a week. She is done with it now.   Advised to retake her BP in an hour to see if trending downward.  If not going down then retake in another 30 after this.  If not trending down or increasing she will call back.   She is advised to take her BP daily before medications and 2 hour after medications until her appt. Next week with Dr Antoine Poche.  If she has any concerns before then she will call the office.  Please advise if any further instructions

## 2022-12-22 ENCOUNTER — Ambulatory Visit: Payer: Medicare Other | Admitting: Cardiology

## 2022-12-25 DIAGNOSIS — R42 Dizziness and giddiness: Secondary | ICD-10-CM | POA: Insufficient documentation

## 2022-12-25 NOTE — Progress Notes (Unsigned)
Cardiology Office Note:   Date:  12/27/2022  ID:  Shannon Navarro, DOB [birth year], MRN [id number] PCP: Myrlene Broker, MD  [facility] Providers Cardiologist:  Rollene Rotunda, MD {  History of Present Illness:   Shannon Navarro is a 67 y.o. female who I saw her previously for cardiomegaly on the chest x-ray.  She was also found on a chest x-ray to have aortic atherosclerosis.  She has had a history of bradycardia.   She had an outpatient exercise tolerance test was ordered and completed on 06/13/2015, there was no evidence of ischemia, she was able to achieve 85% maximal heart rate after 6 minutes, no ST segment deviation was noted. The patient presented to the emergency room on 02/21/2016 with palpitation that woke her up from sleep and found to have new onset of atrial fibrillation. Patient spontaneously converted. Given her elevated CHA2DS2-Vasc score 2 (female and HTN), she was placed on Xarelto.    Eventually I took her off of this because she has had no recurrent fibrillation.  She was in the ED with atrial fib in May 2024.    I reviewed these records for this visit.   She had fibrillation that stopped after a dose of IV metoprolol.  She had some other episodes of tachypalpitations.  She felt a jumping around the other day and her blood pressure was 177/104.  She had been on prednisone for 4 days.  She is not having any new chest pressure, neck or arm discomfort.  She has had no new shortness of breath, PND or orthopnea.  She has had no presyncope or syncope.  She has had no weight gain or edema.  She has had problems with lower blood pressure and heart rate in the past but not recently.  She said typically her blood pressure when she is not taking her prednisone has been in the 120s over 70s.  ROS: As stated in the HPI and negative for all other systems.   EKG:   EKG Interpretation Date/Time:  Friday December 26 2022 12:13:56 EDT Ventricular Rate:  46 PR Interval:  128 QRS  Duration:  80 QT Interval:  460 QTC Calculation: 402 R Axis:   0  Text Interpretation: Sinus bradycardia When compared with ECG of 23-Sep-2022 13:24, No significant change since last tracing Confirmed by Rollene Rotunda ([id number]) on 12/26/2022 12:49:11 PM     Risk Assessment/Calculations:    CHA2DS2-VASc Score = 4   This indicates a 4.8% annual risk of stroke. The patient's score is based upon: CHF History: 0 HTN History: 1 Diabetes History: 0 Stroke History: 0 Vascular Disease History: 1 Age Score: 1 Gender Score: 1    Physical Exam:   VS:  BP (!) 140/82 (BP Location: Right Arm, Patient Position: Sitting, Cuff Size: Large)   Pulse (!) 46   Ht 5\' 6"  (1.676 m)   Wt 228 lb 9.6 oz (103.7 kg)   SpO2 98%   BMI 36.90 kg/m    Wt Readings from Last 3 Encounters:  12/26/22 228 lb 9.6 oz (103.7 kg)  09/23/22 223 lb 8.7 oz (101.4 kg)  08/12/22 223 lb 8 oz (101.4 kg)     GEN: Well nourished, well developed in no acute distress NECK: No JVD; No carotid bruits CARDIAC: RRR, no murmurs, rubs, gallops RESPIRATORY:  Clear to auscultation without rales, wheezing or rhonchi  ABDOMEN: Soft, non-tender, non-distended EXTREMITIES:  No edema; No deformity   ASSESSMENT AND PLAN:  PAF:  [name] has a CHA2DS2 - VASc score of 4.  At this point given the fact that she is documented episodes of atrial fibrillation that required an ER visit I think she needs anticoagulation.  We discussed the risk benefits of this and she will be started back on Eliquis.  I have suggested metoprolol 12.5 mg as needed palpitations and she will try this as well.   HTN:  The blood pressure is controlled.  No change in therapy.    BRADYCARDIA:    She has had no symptomatic bradycardia and I did not pick any of this up on a monitor that she wore last year.  No change in therapy.  OBSTRUCTIVE SLEEP APNEA:    She is using her CPAP .    Follow up with me in six months.   Signed, Rollene Rotunda, MD

## 2022-12-26 ENCOUNTER — Telehealth: Payer: Self-pay | Admitting: Cardiology

## 2022-12-26 ENCOUNTER — Ambulatory Visit: Payer: Medicare Other | Attending: Cardiology | Admitting: Cardiology

## 2022-12-26 ENCOUNTER — Encounter: Payer: Self-pay | Admitting: Cardiology

## 2022-12-26 VITALS — BP 140/82 | HR 46 | Ht 66.0 in | Wt 228.6 lb

## 2022-12-26 DIAGNOSIS — R42 Dizziness and giddiness: Secondary | ICD-10-CM | POA: Insufficient documentation

## 2022-12-26 DIAGNOSIS — R001 Bradycardia, unspecified: Secondary | ICD-10-CM | POA: Insufficient documentation

## 2022-12-26 DIAGNOSIS — I480 Paroxysmal atrial fibrillation: Secondary | ICD-10-CM | POA: Insufficient documentation

## 2022-12-26 DIAGNOSIS — I10 Essential (primary) hypertension: Secondary | ICD-10-CM | POA: Insufficient documentation

## 2022-12-26 MED ORDER — APIXABAN 5 MG PO TABS: 5.0000 mg | ORAL_TABLET | Freq: Two times a day (BID) | ORAL | 3 refills | Status: DC

## 2022-12-26 MED ORDER — APIXABAN 5 MG PO TABS: 5.0000 mg | ORAL_TABLET | Freq: Two times a day (BID) | ORAL | 0 refills | Status: DC

## 2022-12-26 MED ORDER — METOPROLOL TARTRATE 25 MG PO TABS: ORAL_TABLET | ORAL | 0 refills | Status: DC

## 2022-12-26 NOTE — Patient Instructions (Signed)
Medication Instructions:  Your physician has recommended you make the following change in your medication:   -Start taking apixaban Eliquis 5mg  twice daily.  -Start metoprolol tartrate (lopressor) 12.5mg  (1/2 tablet) as needed every 8 hours.   *If you need a refill on your cardiac medications before your next appointment, please call your pharmacy*   Follow-Up: At [facility], you and your health needs are our priority.  As part of our continuing mission to provide you with exceptional heart care, we have created designated Provider Care Teams.  These Care Teams include your primary Cardiologist (physician) and Advanced Practice Providers (APPs -  Physician Assistants and Nurse Practitioners) who all work together to provide you with the care you need, when you need it.  We recommend signing up for the patient portal called "MyChart".  Sign up information is provided on this After Visit Summary.  MyChart is used to connect with patients for Virtual Visits (Telemedicine).  Patients are able to view lab/test results, encounter notes, upcoming appointments, etc.  Non-urgent messages can be sent to your provider as well.   To learn more about what you can do with MyChart, go to [email or web address].    Your next appointment:   6 month(s)  Provider:   Rollene Rotunda, MD

## 2022-12-26 NOTE — Telephone Encounter (Signed)
Call to patient and she hangs up

## 2022-12-26 NOTE — Telephone Encounter (Signed)
Pt c/o medication issue:  1. Name of Medication: metoprolol tartrate (LOPRESSOR) 25 MG tablet   2. How are you currently taking this medication (dosage and times per day)?   3. Are you having a reaction (difficulty breathing--STAT)?   4. What is your medication issue? Patient called stating she saw Dr. Winona Lions today and was put on this medication today. However,  on her AVS and script it is showing to Take 1 tablet (25 mg) TWO hours prior to CT scan.  However now CT scan was ordered for her.

## 2022-12-27 ENCOUNTER — Encounter: Payer: Self-pay | Admitting: Cardiology

## 2022-12-29 ENCOUNTER — Telehealth: Payer: Self-pay | Admitting: Cardiology

## 2022-12-29 MED ORDER — METOPROLOL TARTRATE 25 MG PO TABS: 12.5000 mg | ORAL_TABLET | Freq: Every day | ORAL | 11 refills | Status: AC

## 2022-12-29 NOTE — Telephone Encounter (Signed)
Spoke with pt, aware new script has been sent to the pharmacy.

## 2022-12-29 NOTE — Telephone Encounter (Signed)
*  STAT* If patient is at the pharmacy, call can be transferred to refill team.   1. Which medications need to be refilled? (please list name of each medication and dose if known)   metoprolol tartrate (lopressor) 12.5mg  (1/2 tablet) as needed every 8 hours.    2. Would you like to learn more about the convenience, safety, & potential cost savings by using the [facility]? No   3. Are you open to using the [facility] (Type [facility]No   4. Which pharmacy/location (including street and city if local pharmacy) is medication to be sent to? [id number] - [place], [place] - [address]    5. Do they need a 30 day or 90 day supply? 30   Pt states that this is the mediation that should have been called into pharmacy per her AVS and not  metoprolol tartrate (LOPRESSOR) 25 MG tablet Take 1 tablet (25 mg) TWO hours prior to CT scan

## 2022-12-29 NOTE — Telephone Encounter (Signed)
Pt returning nurses call from Friday. Please advise 

## 2022-12-29 NOTE — Telephone Encounter (Signed)
Pt states that this is the mediation that should have been called into pharmacy per her AVS and not  metoprolol tartrate (LOPRESSOR) 25 MG tablet Take 1 tablet (25 mg) TWO hours prior to CT scan  Please address

## 2022-12-29 NOTE — Telephone Encounter (Signed)
Returned pt call in regards to her Metoprolol. The directions say to take 2 hours prior to CT scan but she is not having a CT scan nor is one ordered for her. This nurse read through her OV note and there is no indication either. The AVS states to take Metoprolol 12.5mg  every 8 hours as needed. Confirmed with pt as well. Informed pt the correct script will be sent to the pharmacy. Pt verbalized understanding.

## 2023-01-13 ENCOUNTER — Telehealth: Payer: Self-pay | Admitting: *Deleted

## 2023-01-13 ENCOUNTER — Ambulatory Visit: Payer: Self-pay | Admitting: Surgery

## 2023-01-13 ENCOUNTER — Ambulatory Visit: Payer: Medicare Other | Admitting: Podiatry

## 2023-01-13 NOTE — Telephone Encounter (Signed)
   Pre-operative Risk Assessment    Patient Name: Shannon Navarro  DOB: 09/06/1955 MRN: [id number]      Request for Surgical Clearance    Procedure:   Excision of lipoma  Date of Surgery:  Clearance TBD                                 Surgeon:  Dr. [name] Surgeon's Group or Practice Name:  [facility]  Phone number:  [phone] Fax number:  [phone]   Type of Clearance Requested:   - Medical  - Pharmacy:  Hold Apixaban (Eliquis) Not Indicated.   Type of Anesthesia:  General    Additional requests/questions:  Patient's last office visit was on 12/26/2022 with Dr. Antoine Poche.   Signed, Emmit Pomfret   01/13/2023, 3:30 PM

## 2023-01-14 NOTE — Telephone Encounter (Signed)
   Patient Name: Shannon Navarro  DOB: 15-Apr-1956 MRN: [id number]  Primary Cardiologist: Rollene Rotunda, MD  Chart reviewed as part of pre-operative protocol coverage.   Patient saw Dr. Antoine Poche on 12/26/2022.  Per Dr. Antoine Poche, "patient is at acceptable risk for the planned procedure without any further cardiovascular testing." She may hold Eliquis for 2 days prior to procedure. Please resume Eliquis as soon as possible postprocedure, at the discretion of the surgeon.   I will route this recommendation to the requesting party via Epic fax function and remove from pre-op pool.  Please call with questions.  Joylene Grapes, NP 01/14/2023, 8:19 AM

## 2023-01-19 ENCOUNTER — Telehealth: Payer: Self-pay | Admitting: Cardiology

## 2023-01-19 MED ORDER — RIVAROXABAN 20 MG PO TABS: 20.0000 mg | ORAL_TABLET | Freq: Every day | ORAL | 3 refills | Status: DC

## 2023-01-19 NOTE — Telephone Encounter (Signed)
Returned call to pt with pharmacist recommendation below. Pt verbalized understanding. Mailed pt a 30 free supply card for the Xarelto.   Ok to change back to Xarelto. She is on medicare so she cannot use monthly copay cards. But she might be able to find a free 30 day card online if she did not use it when she was on Xarelto previously. Xarelto 20mg  with the largest meal of the day.

## 2023-01-19 NOTE — Telephone Encounter (Signed)
  Pt c/o medication issue:  1. Name of Medication: apixaban (ELIQUIS) 5 MG TABS tablet   2. How are you currently taking this medication (dosage and times per day)? As directed  3. Are you having a reaction (difficulty breathing--STAT)?  Having bad reflux and stomach pains, nausea and gas  4. What is your medication issue?  Patient would like to discuss going back on Xarelto

## 2023-01-19 NOTE — Telephone Encounter (Signed)
Returned call to pt. She started on the Eliquis on the 16th of August. She has been having several side effects. She can not tolerate the symptoms below. She wants to go back on Xarelto. Please advise. Also, talked to her about the savings card as she spent $400 on the Eliquis and Xarelto. Please advise.

## 2023-01-19 NOTE — Telephone Encounter (Signed)
Ok to change back to Xarelto. She is on medicare so she cannot use monthly copay cards. But she might be able to find a free 30 day card online if she did not use it when she was on Xarelto previously. Xarelto 20mg  with the largest meal of the day.

## 2023-01-20 ENCOUNTER — Ambulatory Visit: Payer: Medicare Other | Admitting: Podiatry

## 2023-02-04 ENCOUNTER — Inpatient Hospital Stay (HOSPITAL_COMMUNITY): Admission: RE | Admit: 2023-02-04 | Payer: Medicare Other | Source: Ambulatory Visit

## 2023-02-09 ENCOUNTER — Ambulatory Visit: Payer: Medicare Other | Admitting: Internal Medicine

## 2023-02-09 NOTE — Progress Notes (Signed)
COVID Vaccine received:  []  No [x]  Yes Date of any COVID positive Test in last 90 days: no PCP - Hillard Danker MD Cardiologist - Rollene Rotunda MD  Chest x-ray - 09/23/22 Epic EKG -  12/26/22 Epic Stress Test - 06/13/15 Epic ECHO - 04/09/22 Epic Cardiac Cath -   Bowel Prep - [x]  No  []   Yes ______  Pacemaker / ICD device [x]  No []  Yes   Spinal Cord Stimulator:[x]  No []  Yes       History of Sleep Apnea? []  No [x]  Yes   CPAP used?- []  No [x]  Yes    Does the patient monitor blood sugar?          [x]  No []  Yes  []  N/A  Patient has: [x]  NO Hx DM   []  Pre-DM                 []  DM1  []   DM2 Does patient have a [employer] or Dexacom? []  No []  Yes   Fasting Blood Sugar Ranges-  Checks Blood Sugar _____ times a day  GLP1 agonist / usual dose - no GLP1 instructions:  SGLT-2 inhibitors / usual dose -no SGLT-2 instructions:   Blood Thinner / Instructions:Xarelto- will stop 2 days before surgwery. Last dose 02/14/23 Aspirin Instructions:  Comments:   Activity level: Patient is able to climb a flight of stairs without difficulty; [x]  No CP  [x]  No SOB,   Patient can  perform ADLs without assistance.   Anesthesia review: HTN, OSA, A-fib, Aortic atherosclosis  Patient denies shortness of breath, fever, cough and chest pain at PAT appointment.  Patient verbalized understanding and agreement to the Pre-Surgical Instructions that were given to them at this PAT appointment. Patient was also educated of the need to review these PAT instructions again prior to his/her surgery.I reviewed the appropriate phone numbers to call if they have any and questions or concerns.

## 2023-02-09 NOTE — Patient Instructions (Signed)
SURGICAL WAITING ROOM VISITATION  Patients having surgery or a procedure may have no more than 2 support people in the waiting area - these visitors may rotate.    Children under the age of 27 must have an adult with them who is not the patient.  Due to an increase in RSV and influenza rates and associated hospitalizations, children ages 55 and under may not visit patients in [facility] hospitals.  If the patient needs to stay at the hospital during part of their recovery, the visitor guidelines for inpatient rooms apply. Pre-op nurse will coordinate an appropriate time for 1 support person to accompany patient in pre-op.  This support person may not rotate.    Please refer to the [facility] website for the visitor guidelines for Inpatients (after your surgery is over and you are in a regular room).       Your procedure is scheduled on: 02/17/23   Report to [facility] Main Entrance    Report to admitting at 5:15 AM   Call this number if you have problems the morning of surgery [phone]   Do not eat food :After Midnight.   After Midnight you may have the following liquids until 4:30 AM DAY OF SURGERY  Water Non-Citrus Juices (without pulp, NO RED-Apple, White grape, White cranberry) Black Coffee (NO MILK/CREAM OR CREAMERS, sugar ok)  Clear Tea (NO MILK/CREAM OR CREAMERS, sugar ok) regular and decaf                             Plain Jell-O (NO RED)                                           Fruit ices (not with fruit pulp, NO RED)                                     Popsicles (NO RED)                                                               Sports drinks like Gatorade (NO RED)                 Oral Hygiene is also important to reduce your risk of infection.                                    Remember - BRUSH YOUR TEETH THE MORNING OF SURGERY WITH YOUR REGULAR TOOTHPASTE   Stop all vitamins and herbal supplements 7 days before surgery.   Take these medicines  the morning of surgery with A SIP OF WATER:   Bring CPAP mask and tubing day of surgery.                              You may not have any metal on your body including hair pins, jewelry, and body piercing  Do not wear make-up, lotions, powders, perfumes or deodorant  Do not wear nail polish including gel and S&S, artificial/acrylic nails, or any other type of covering on natural nails including finger and toenails. If you have artificial nails, gel coating, etc. that needs to be removed by a nail salon please have this removed prior to surgery or surgery may need to be canceled/ delayed if the surgeon/ anesthesia feels like they are unable to be safely monitored.   Do not shave  48 hours prior to surgery.    Do not bring valuables to the hospital. [facility] IS NOT             RESPONSIBLE   FOR VALUABLES.   Contacts, glasses, dentures or bridgework may not be worn into surgery.   Bring small overnight bag day of surgery.   DO NOT BRING YOUR HOME MEDICATIONS TO THE HOSPITAL. PHARMACY WILL DISPENSE MEDICATIONS LISTED ON YOUR MEDICATION LIST TO YOU DURING YOUR ADMISSION IN THE HOSPITAL!    Patients discharged on the day of surgery will not be allowed to drive home.  Someone NEEDS to stay with you for the first 24 hours after anesthesia.   Special Instructions: Bring a copy of your healthcare power of attorney and living will documents the day of surgery if you haven't scanned them before.              Please read over the following fact sheets you were given: IF YOU HAVE QUESTIONS ABOUT YOUR PRE-OP INSTRUCTIONS PLEASE CALL [phone] Rosey [facility]   If you received a COVID test during your pre-op visit  it is requested that you wear a mask when out in public, stay away from anyone that may not be feeling well and notify your surgeon if you develop symptoms. If you test positive for Covid or have been in contact with anyone that has tested positive in the last 10 days please notify  you surgeon.    [facility] - Preparing for Surgery Before surgery, you can play an important role.  Because skin is not sterile, your skin needs to be as free of germs as possible.  You can reduce the number of germs on your skin by washing with CHG (chlorahexidine gluconate) soap before surgery.  CHG is an antiseptic cleaner which kills germs and bonds with the skin to continue killing germs even after washing. Please DO NOT use if you have an allergy to CHG or antibacterial soaps.  If your skin becomes reddened/irritated stop using the CHG and inform your nurse when you arrive at Short Stay. Do not shave (including legs and underarms) for at least 48 hours prior to the first CHG shower.  You may shave your face/neck.  Please follow these instructions carefully:  1.  Shower with CHG Soap the night before surgery and the  morning of surgery.  2.  If you choose to wash your hair, wash your hair first as usual with your normal  shampoo.  3.  After you shampoo, rinse your hair and body thoroughly to remove the shampoo.                             4.  Use CHG as you would any other liquid soap.  You can apply chg directly to the skin and wash.  Gently with a scrungie or clean washcloth.  5.  Apply the CHG Soap to your body ONLY FROM THE NECK DOWN.  Do   not use on face/ open                           Wound or open sores. Avoid contact with eyes, ears mouth and   genitals (private parts).                       Wash face,  Genitals (private parts) with your normal soap.             6.  Wash thoroughly, paying special attention to the area where your    surgery  will be performed.  7.  Thoroughly rinse your body with warm water from the neck down.  8.  DO NOT shower/wash with your normal soap after using and rinsing off the CHG Soap.                9.  Pat yourself dry with a clean towel.            10.  Wear clean pajamas.            11.  Place clean sheets on your bed the night of your first shower  and do not  sleep with pets. Day of Surgery : Do not apply any lotions/deodorants the morning of surgery.  Please wear clean clothes to the hospital/surgery center.  FAILURE TO FOLLOW THESE INSTRUCTIONS MAY RESULT IN THE CANCELLATION OF YOUR SURGERY  PATIENT SIGNATURE_________________________________  NURSE SIGNATURE__________________________________  ________________________________________________________________________

## 2023-02-10 ENCOUNTER — Encounter (HOSPITAL_COMMUNITY): Payer: Self-pay

## 2023-02-10 ENCOUNTER — Encounter (HOSPITAL_COMMUNITY)
Admission: RE | Admit: 2023-02-10 | Discharge: 2023-02-10 | Disposition: A | Discharge status: Home / Self Care | Payer: Medicare Other | Source: Ambulatory Visit | Attending: Surgery | Admitting: Surgery

## 2023-02-10 ENCOUNTER — Other Ambulatory Visit: Payer: Self-pay

## 2023-02-10 VITALS — BP 146/79 | HR 42 | Temp 98.1°F | Resp 16 | Ht 66.0 in | Wt 229.0 lb

## 2023-02-10 DIAGNOSIS — Z7901 Long term (current) use of anticoagulants: Secondary | ICD-10-CM | POA: Insufficient documentation

## 2023-02-10 DIAGNOSIS — R222 Localized swelling, mass and lump, trunk: Secondary | ICD-10-CM | POA: Insufficient documentation

## 2023-02-10 DIAGNOSIS — Z01812 Encounter for preprocedural laboratory examination: Secondary | ICD-10-CM | POA: Diagnosis present

## 2023-02-10 DIAGNOSIS — I480 Paroxysmal atrial fibrillation: Secondary | ICD-10-CM | POA: Insufficient documentation

## 2023-02-10 DIAGNOSIS — G4733 Obstructive sleep apnea (adult) (pediatric): Secondary | ICD-10-CM | POA: Insufficient documentation

## 2023-02-10 DIAGNOSIS — I10 Essential (primary) hypertension: Secondary | ICD-10-CM | POA: Diagnosis not present

## 2023-02-10 LAB — BASIC METABOLIC PANEL
Anion gap: 6 (ref 5–15)
BUN: 11 mg/dL (ref 8–23)
CO2: 26 mmol/L (ref 22–32)
Calcium: 9 mg/dL (ref 8.9–10.3)
Chloride: 108 mmol/L (ref 98–111)
Creatinine, Ser: 0.75 mg/dL (ref 0.44–1.00)
GFR, Estimated: >60 mL/min (ref >60)
Glucose, Bld: 96 mg/dL (ref 70–99)
Potassium: 4.2 mmol/L (ref 3.5–5.1)
Sodium: 140 mmol/L (ref 135–145)

## 2023-02-10 LAB — CBC
HCT: 45 % (ref 36.0–46.0)
Hemoglobin: 14.9 g/dL (ref 12.0–15.0)
MCH: 29.5 pg (ref 26.0–34.0)
MCHC: 33.1 g/dL (ref 30.0–36.0)
MCV: 89.1 fL (ref 80.0–100.0)
Platelets: 247 10*3/uL (ref 150–400)
RBC: 5.05 MIL/uL (ref 3.87–5.11)
RDW: 12.5 % (ref 11.5–15.5)
WBC: 6.4 10*3/uL (ref 4.0–10.5)
nRBC: 0 % (ref 0.0–0.2)

## 2023-02-11 NOTE — Progress Notes (Signed)
Anesthesia Chart Review   Case: [id number] Date/Time: 02/17/23 0715   Procedure: EXCISION SOFT TISSUE MASS LEFT BACK (Left)   Anesthesia type: General   Pre-op diagnosis: SOFT TISSUE MASS LEFT BACK   [facility] ROOM 01 / [facility]   Surgeons: [name], MD       DISCUSSION:66 y.o. never smoker with h/o PONV, HTN, OSA on CPAP, PAF, soft tissue mass to left back scheduled for above procedure 02/17/2023 with Dr. [name].   Per cardiology preoperative evaluation 01/14/2023, "Patient saw Dr. Antoine Poche on 12/26/2022.  Per Dr. Antoine Poche, "patient is at acceptable risk for the planned procedure without any further cardiovascular testing." She may hold Eliquis for 2 days prior to procedure. Please resume Eliquis as soon as possible postprocedure, at the discretion of the surgeon. "  Pt changed from Eliquis to Xarelto, could not tolerate side effects of Eliquis. Pt reports she will hold Xarelto 2 days prior to procedure.  VS: BP (!) 146/79   Pulse (!) 42   Temp 36.7 C (Oral)   Resp 16   Ht 5\' 6"  (1.676 m)   Wt 103.9 kg   SpO2 100%   BMI 36.96 kg/m   PROVIDERS: Myrlene Broker, MD is PCP   Cardiologist - Rollene Rotunda MD   LABS: Labs reviewed: Acceptable for surgery. (all labs ordered are listed, but only abnormal results are displayed)  Labs Reviewed  BASIC METABOLIC PANEL  CBC     IMAGES:   EKG:   CV: Echo 04/09/2022  1. Left ventricular ejection fraction, by estimation, is 60 to 65%. The  left ventricle has normal function. The left ventricle has no regional  wall motion abnormalities. Left ventricular diastolic parameters are  indeterminate.   2. Right ventricular systolic function is normal. The right ventricular  size is normal. There is normal pulmonary artery systolic pressure.   3. No evidence of mitral valve regurgitation.   4. Aortic valve regurgitation is not visualized.   5. The inferior vena cava is normal in size with greater than 50%  respiratory  variability, suggesting right atrial pressure of 3 mmHg.   Comparison(s): No significant change from prior study.  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   History of esophageal stricture    mainly due to gerd   History of exercise stress test 06/13/2015   PER DR HOCHREIN NOTE -- NO EVIDENCE ISCHEMIA   HYPERTENSION    Impaired fasting glucose    Migraine    hx of migraines none recent   OSA on CPAP followed by dr Craige Cotta   per study 02-09-2012  severe osa w/ AHI 33.8   Paroxysmal atrial fibrillation (HCC) cardioloigst-  dr Antoine Poche   dx 02-21-2016 at ED   Pneumonia    with COVID   PONV (postoperative nausea and vomiting)    after surgery in 10/2021- had nausea after surgery related to preop drink   Pre-diabetes    Pseudotumor cerebri 2007   S/P cervical spinal fusion 10/24/2021   Sleep apnea    Vitamin D deficiency 07/2013    Past Surgical History:  Procedure Laterality Date   ABDOMINAL HYSTERECTOMY     ANTERIOR CERVICAL DECOMPRESSION/DISCECTOMY FUSION  4 LEVELS N/A 10/24/2021   Procedure: ANTERIOR CERVICAL DECOMPRESSION FUSION CERVICAL FOUR THROUGH CERVICAL FIVE, CERVICAL FIVE THROUGH CERVICAL SIX, CERVICAL SIX THROUGH CERVICAL SEVEN WITH INSTRUMENTATION AND ALLOGRAFT;  Surgeon: Estill Bamberg, MD;  Location: [facility];  Service: Orthopedics;  Laterality: N/A;   BALLOON DILATION N/A 04/11/2022   Procedure: BALLOON DILATION;  Surgeon: Lynann Bologna, MD;  [facility];  Service: Gastroenterology;  Laterality: N/A;   BIOPSY  04/11/2022   Procedure: BIOPSY;  Surgeon: Lynann Bologna, MD;  [facility];  Service: Gastroenterology;;   BREAST BIOPSY Right 08/12/2019    FIBROADENOMA    CHOLECYSTECTOMY N/A 04/10/2022   Procedure: LAPAROSCOPIC  CHOLECYSTECTOMY WITH INTRAOPERATIVE CHOLANGIOGRAM;  Surgeon: Rodman Pickle, MD;  [facility];  Service: General;  Laterality: N/A;   COLONOSCOPY     colonscopy  2007   normal   DILATATION & CURETTAGE/HYSTEROSCOPY WITH MYOSURE N/A 03/26/2017   Procedure: DILATATION & CURETTAGE/HYSTEROSCOPY WITH MYOSURE;  Surgeon: Huel Cote, MD;  Location: [facility];  Service: Gynecology;  Laterality: N/A;   DILATION AND CURETTAGE OF UTERUS  2010   ERCP N/A 04/11/2022   Procedure: ENDOSCOPIC RETROGRADE CHOLANGIOPANCREATOGRAPHY (ERCP);  Surgeon: Lynann Bologna, MD;  Location: [facility];  Service: Gastroenterology;  Laterality: N/A;   ESOPHAGOGASTRODUODENOSCOPY N/A 04/11/2022   Procedure: ESOPHAGOGASTRODUODENOSCOPY (EGD);  Surgeon: Lynann Bologna, MD;  Location: [facility];  Service: Gastroenterology;  Laterality: N/A;   FRACTURE SURGERY     LAPAROSCOPIC HYSTERECTOMY Bilateral 06/18/2017   Procedure: HYSTERECTOMY TOTAL LAPAROSCOPIC;  Surgeon: Huel Cote, MD;  Location: [facility];  Service: Gynecology;  Laterality: Bilateral;  OUT PT IN BED, needs extra time MD request 2.5hrs in OR   REMOVAL OF STONES  04/11/2022   Procedure: REMOVAL OF STONES;  Surgeon: Lynann Bologna, MD;  [facility];  Service: Gastroenterology;;   right knee arthroscopy  03/2013   SPHINCTEROTOMY  04/11/2022   Procedure: Dennison Mascot;  Surgeon: Lynann Bologna, MD;  [facility];  Service: Gastroenterology;;   TONSILLECTOMY Bilateral 01/10/2022   Procedure: TONSILLECTOMY;  Surgeon: Serena Colonel, MD;  [facility]: [facility] OR;  Service: ENT;  Laterality: Bilateral;   torn meniscus surgery on right knee      TRANSTHORACIC ECHOCARDIOGRAM  03/11/2016   ef 60-65%/  mild MR/ trivial PR and TR   TUBAL LIGATION     UPPER GASTROINTESTINAL ENDOSCOPY  11/2020    MEDICATIONS:  amLODipine (NORVASC) 2.5 MG tablet   fluticasone (FLONASE) 50 MCG/ACT nasal spray   loratadine (CLARITIN)  10 MG tablet   MAGNESIUM PO   metoprolol tartrate (LOPRESSOR) 25 MG tablet   omeprazole (PRILOSEC OTC) 20 MG tablet   rivaroxaban (XARELTO) 20 MG TABS tablet   VITAMIN D PO   No current facility-administered medications for this encounter.    Jodell Cipro Ward, PA-C [facility] [phone]

## 2023-02-11 NOTE — Anesthesia Preprocedure Evaluation (Addendum)
Anesthesia Evaluation  Patient identified by MRN, date of birth, ID band Patient awake    Reviewed: Allergy & Precautions, NPO status , Patient's Chart, lab work & pertinent test results  History of Anesthesia Complications (+) PONV and history of anesthetic complications  Airway Mallampati: II  TM Distance: >3 FB Neck ROM: Full    Dental  (+) Dental Advisory Given, Teeth Intact   Pulmonary sleep apnea and Continuous Positive Airway Pressure Ventilation    Pulmonary exam normal        Cardiovascular hypertension, Pt. on medications + dysrhythmias Atrial Fibrillation  Rhythm:Regular Rate:Bradycardia     Neuro/Psych  Headaches PSYCHIATRIC DISORDERS Anxiety      Pseudotumor cerebri     GI/Hepatic Neg liver ROS,GERD  Medicated and Controlled,,  Endo/Other    Morbid obesity Pre-DM   Renal/GU negative Renal ROS     Musculoskeletal negative musculoskeletal ROS (+)    Abdominal  (+) + obese  Peds  Hematology  On xarelto    Anesthesia Other Findings   Reproductive/Obstetrics                             Anesthesia Physical Anesthesia Plan  ASA: 3  Anesthesia Plan: General   Post-op Pain Management: Tylenol PO (pre-op)*   Induction: Intravenous  PONV Risk Score and Plan: 4 or greater and Treatment may vary due to age or medical condition, Ondansetron, Dexamethasone and Propofol infusion  Airway Management Planned: Oral ETT  Additional Equipment: None  Intra-op Plan:   Post-operative Plan: Extubation in OR  Informed Consent: I have reviewed the patients History and Physical, chart, labs and discussed the procedure including the risks, benefits and alternatives for the proposed anesthesia with the patient or authorized representative who has indicated his/her understanding and acceptance.     Dental advisory given  Plan Discussed with: CRNA and Anesthesiologist  Anesthesia  Plan Comments: (See PAT note 02/10/2023)       Anesthesia Quick Evaluation

## 2023-02-17 ENCOUNTER — Encounter (HOSPITAL_COMMUNITY)
Admission: RE | Disposition: A | Discharge status: Home / Self Care | Payer: Self-pay | Source: Ambulatory Visit | Attending: Surgery

## 2023-02-17 ENCOUNTER — Encounter (HOSPITAL_COMMUNITY): Payer: Self-pay | Admitting: Surgery

## 2023-02-17 ENCOUNTER — Other Ambulatory Visit: Payer: Self-pay

## 2023-02-17 ENCOUNTER — Ambulatory Visit (HOSPITAL_COMMUNITY)
Admission: RE | Admit: 2023-02-17 | Discharge: 2023-02-18 | Disposition: A | Discharge status: Home / Self Care | Payer: Medicare Other | Source: Ambulatory Visit | Attending: Surgery | Admitting: Surgery

## 2023-02-17 ENCOUNTER — Ambulatory Visit (HOSPITAL_BASED_OUTPATIENT_CLINIC_OR_DEPARTMENT_OTHER): Payer: Self-pay

## 2023-02-17 ENCOUNTER — Ambulatory Visit (HOSPITAL_COMMUNITY): Payer: Medicare Other | Admitting: Physician Assistant

## 2023-02-17 DIAGNOSIS — I10 Essential (primary) hypertension: Secondary | ICD-10-CM | POA: Diagnosis not present

## 2023-02-17 DIAGNOSIS — I4891 Unspecified atrial fibrillation: Secondary | ICD-10-CM

## 2023-02-17 DIAGNOSIS — R222 Localized swelling, mass and lump, trunk: Secondary | ICD-10-CM | POA: Diagnosis not present

## 2023-02-17 DIAGNOSIS — G4733 Obstructive sleep apnea (adult) (pediatric): Secondary | ICD-10-CM | POA: Insufficient documentation

## 2023-02-17 DIAGNOSIS — D171 Benign lipomatous neoplasm of skin and subcutaneous tissue of trunk: Secondary | ICD-10-CM | POA: Diagnosis not present

## 2023-02-17 DIAGNOSIS — G932 Benign intracranial hypertension: Secondary | ICD-10-CM | POA: Diagnosis not present

## 2023-02-17 DIAGNOSIS — Z6836 Body mass index (BMI) 36.0-36.9, adult: Secondary | ICD-10-CM | POA: Diagnosis not present

## 2023-02-17 DIAGNOSIS — M7989 Other specified soft tissue disorders: Secondary | ICD-10-CM | POA: Diagnosis present

## 2023-02-17 DIAGNOSIS — I480 Paroxysmal atrial fibrillation: Secondary | ICD-10-CM | POA: Diagnosis not present

## 2023-02-17 DIAGNOSIS — K219 Gastro-esophageal reflux disease without esophagitis: Secondary | ICD-10-CM | POA: Insufficient documentation

## 2023-02-17 DIAGNOSIS — R7303 Prediabetes: Secondary | ICD-10-CM | POA: Diagnosis not present

## 2023-02-17 HISTORY — PX: MASS EXCISION: SHX2000

## 2023-02-17 LAB — CBC WITH DIFFERENTIAL/PLATELET
Abs Immature Granulocytes: 0.04 10*3/uL (ref 0.00–0.07)
Basophils Absolute: 0 10*3/uL (ref 0.0–0.1)
Basophils Relative: 0 %
Eosinophils Absolute: 0 10*3/uL (ref 0.0–0.5)
Eosinophils Relative: 0 %
HCT: 40.3 % (ref 36.0–46.0)
Hemoglobin: 13.3 g/dL (ref 12.0–15.0)
Immature Granulocytes: 0 %
Lymphocytes Relative: 6 %
Lymphs Abs: 0.6 10*3/uL — ABNORMAL LOW (ref 0.7–4.0)
MCH: 29.2 pg (ref 26.0–34.0)
MCHC: 33 g/dL (ref 30.0–36.0)
MCV: 88.4 fL (ref 80.0–100.0)
Monocytes Absolute: 0.6 10*3/uL (ref 0.1–1.0)
Monocytes Relative: 6 %
Neutro Abs: 8.8 10*3/uL — ABNORMAL HIGH (ref 1.7–7.7)
Neutrophils Relative %: 88 %
Platelets: 246 10*3/uL (ref 150–400)
RBC: 4.56 MIL/uL (ref 3.87–5.11)
RDW: 12.5 % (ref 11.5–15.5)
WBC: 10.1 10*3/uL (ref 4.0–10.5)
nRBC: 0 % (ref 0.0–0.2)

## 2023-02-17 LAB — TYPE AND SCREEN
ABO/RH(D): A NEG
Antibody Screen: NEGATIVE

## 2023-02-17 LAB — PROTIME-INR
INR: 1.1 (ref 0.8–1.2)
Prothrombin Time: 14 s (ref 11.4–15.2)

## 2023-02-17 SURGERY — EXCISION MASS
Anesthesia: General | Laterality: Left

## 2023-02-17 MED ORDER — CHLORHEXIDINE GLUCONATE CLOTH 2 % EX PADS: 6.0000 | MEDICATED_PAD | Freq: Once | CUTANEOUS | Status: DC

## 2023-02-17 MED ORDER — ACETAMINOPHEN 650 MG RE SUPP: 650.0000 mg | Freq: Four times a day (QID) | RECTAL | Status: DC | PRN

## 2023-02-17 MED ORDER — VANCOMYCIN HCL 1500 MG/300ML IV SOLN
1500.0000 mg | INTRAVENOUS | Status: AC
  Administered 2023-02-17: 1500 mg via INTRAVENOUS
  Filled 2023-02-17: qty 300

## 2023-02-17 MED ORDER — AMLODIPINE BESYLATE 5 MG PO TABS
2.5000 mg | ORAL_TABLET | Freq: Every day | ORAL | Status: DC
  Administered 2023-02-18: 2.5 mg via ORAL
  Filled 2023-02-17: qty 1

## 2023-02-17 MED ORDER — GLYCOPYRROLATE 0.2 MG/ML IJ SOLN
INTRAMUSCULAR | Status: AC
  Filled 2023-02-17: qty 1

## 2023-02-17 MED ORDER — ONDANSETRON HCL 4 MG/2ML IJ SOLN
INTRAMUSCULAR | Status: AC
  Filled 2023-02-17: qty 2

## 2023-02-17 MED ORDER — MIDAZOLAM HCL 5 MG/5ML IJ SOLN
INTRAMUSCULAR | Status: DC | PRN
  Administered 2023-02-17: 1 mg via INTRAVENOUS

## 2023-02-17 MED ORDER — PROPOFOL 10 MG/ML IV BOLUS: INTRAVENOUS | Status: DC | PRN

## 2023-02-17 MED ORDER — GLYCOPYRROLATE 0.2 MG/ML IJ SOLN
INTRAMUSCULAR | Status: DC | PRN
  Administered 2023-02-17 (×2): .1 mg via INTRAVENOUS

## 2023-02-17 MED ORDER — 0.9 % SODIUM CHLORIDE (POUR BTL) OPTIME
TOPICAL | Status: DC | PRN
Start: 2023-02-17 — End: 2023-02-17
  Administered 2023-02-17: 1000 mL

## 2023-02-17 MED ORDER — FENTANYL CITRATE (PF) 100 MCG/2ML IJ SOLN
INTRAMUSCULAR | Status: AC
  Filled 2023-02-17: qty 2

## 2023-02-17 MED ORDER — OXYCODONE HCL 5 MG PO TABS: 5.0000 mg | ORAL_TABLET | Freq: Once | ORAL | Status: DC | PRN

## 2023-02-17 MED ORDER — PHENYLEPHRINE HCL (PRESSORS) 10 MG/ML IV SOLN
INTRAVENOUS | Status: DC | PRN
Start: 2023-02-17 — End: 2023-02-17
  Administered 2023-02-17: 100 ug via INTRAVENOUS
  Administered 2023-02-17: 80 ug via INTRAVENOUS
  Administered 2023-02-17: 100 ug via INTRAVENOUS
  Administered 2023-02-17 (×2): 80 ug via INTRAVENOUS
  Administered 2023-02-17: 100 ug via INTRAVENOUS
  Administered 2023-02-17: 80 ug via INTRAVENOUS
  Administered 2023-02-17: 100 ug via INTRAVENOUS
  Administered 2023-02-17: 80 ug via INTRAVENOUS

## 2023-02-17 MED ORDER — ACETAMINOPHEN 500 MG PO TABS
1000.0000 mg | ORAL_TABLET | Freq: Once | ORAL | Status: AC
  Administered 2023-02-17: 1000 mg via ORAL
  Filled 2023-02-17: qty 2

## 2023-02-17 MED ORDER — ACETAMINOPHEN 325 MG PO TABS: 650.0000 mg | ORAL_TABLET | Freq: Four times a day (QID) | ORAL | Status: DC | PRN

## 2023-02-17 MED ORDER — HYDROMORPHONE HCL 1 MG/ML IJ SOLN: 1.0000 mg | INTRAMUSCULAR | Status: DC | PRN

## 2023-02-17 MED ORDER — ORAL CARE MOUTH RINSE
15.0000 mL | Freq: Once | OROMUCOSAL | Status: AC
  Administered 2023-02-17: 15 mL via OROMUCOSAL

## 2023-02-17 MED ORDER — OXYCODONE HCL 5 MG PO TABS: 5.0000 mg | ORAL_TABLET | ORAL | Status: DC | PRN

## 2023-02-17 MED ORDER — ALUM & MAG HYDROXIDE-SIMETH 200-200-20 MG/5ML PO SUSP
30.0000 mL | Freq: Four times a day (QID) | ORAL | Status: DC | PRN
  Administered 2023-02-17: 30 mL via ORAL
  Filled 2023-02-17: qty 30

## 2023-02-17 MED ORDER — LIDOCAINE HCL (CARDIAC) PF 100 MG/5ML IV SOSY
PREFILLED_SYRINGE | INTRAVENOUS | Status: DC | PRN
  Administered 2023-02-17: 60 mg via INTRAVENOUS

## 2023-02-17 MED ORDER — PROPOFOL 10 MG/ML IV BOLUS
INTRAVENOUS | Status: DC | PRN
  Administered 2023-02-17: 50 mg via INTRAVENOUS
  Administered 2023-02-17: 150 mg via INTRAVENOUS

## 2023-02-17 MED ORDER — ROCURONIUM BROMIDE 10 MG/ML (PF) SYRINGE
PREFILLED_SYRINGE | INTRAVENOUS | Status: AC
  Filled 2023-02-17: qty 10

## 2023-02-17 MED ORDER — BUPIVACAINE-EPINEPHRINE (PF) 0.5% -1:200000 IJ SOLN
INTRAMUSCULAR | Status: AC
  Filled 2023-02-17: qty 30

## 2023-02-17 MED ORDER — ONDANSETRON HCL 4 MG/2ML IJ SOLN: 4.0000 mg | Freq: Four times a day (QID) | INTRAMUSCULAR | Status: DC | PRN

## 2023-02-17 MED ORDER — ONDANSETRON HCL 4 MG/2ML IJ SOLN: 4.0000 mg | Freq: Once | INTRAMUSCULAR | Status: DC | PRN

## 2023-02-17 MED ORDER — TRAMADOL HCL 50 MG PO TABS: 50.0000 mg | ORAL_TABLET | Freq: Four times a day (QID) | ORAL | Status: DC | PRN

## 2023-02-17 MED ORDER — OXYCODONE HCL 5 MG/5ML PO SOLN: 5.0000 mg | Freq: Once | ORAL | Status: DC | PRN

## 2023-02-17 MED ORDER — FENTANYL CITRATE (PF) 100 MCG/2ML IJ SOLN
INTRAMUSCULAR | Status: DC | PRN
  Administered 2023-02-17: 50 ug via INTRAVENOUS
  Administered 2023-02-17: 25 ug via INTRAVENOUS
  Administered 2023-02-17: 50 ug via INTRAVENOUS

## 2023-02-17 MED ORDER — LACTATED RINGERS IV SOLN: INTRAVENOUS | Status: DC

## 2023-02-17 MED ORDER — ROCURONIUM BROMIDE 100 MG/10ML IV SOLN
INTRAVENOUS | Status: DC | PRN
  Administered 2023-02-17: 20 mg via INTRAVENOUS
  Administered 2023-02-17: 50 mg via INTRAVENOUS

## 2023-02-17 MED ORDER — SUGAMMADEX SODIUM 200 MG/2ML IV SOLN
INTRAVENOUS | Status: DC | PRN
Start: 2023-02-17 — End: 2023-02-17
  Administered 2023-02-17 (×2): 200 mg via INTRAVENOUS

## 2023-02-17 MED ORDER — MIDAZOLAM HCL 2 MG/2ML IJ SOLN
INTRAMUSCULAR | Status: AC
  Filled 2023-02-17: qty 2

## 2023-02-17 MED ORDER — DEXAMETHASONE SODIUM PHOSPHATE 4 MG/ML IJ SOLN
INTRAMUSCULAR | Status: DC | PRN
Start: 2023-02-17 — End: 2023-02-17
  Administered 2023-02-17: 5 mg via INTRAVENOUS

## 2023-02-17 MED ORDER — CHLORHEXIDINE GLUCONATE 0.12 % MT SOLN: 15.0000 mL | Freq: Once | OROMUCOSAL | Status: AC

## 2023-02-17 MED ORDER — FENTANYL CITRATE PF 50 MCG/ML IJ SOSY: 25.0000 ug | PREFILLED_SYRINGE | INTRAMUSCULAR | Status: DC | PRN

## 2023-02-17 MED ORDER — EPHEDRINE SULFATE (PRESSORS) 50 MG/ML IJ SOLN
INTRAMUSCULAR | Status: DC | PRN
Start: 2023-02-17 — End: 2023-02-17
  Administered 2023-02-17: 2.5 mg via INTRAVENOUS
  Administered 2023-02-17 (×3): 5 mg via INTRAVENOUS

## 2023-02-17 MED ORDER — ONDANSETRON 4 MG PO TBDP: 4.0000 mg | ORAL_TABLET | Freq: Four times a day (QID) | ORAL | Status: DC | PRN

## 2023-02-17 MED ORDER — EPHEDRINE 5 MG/ML INJ
INTRAVENOUS | Status: AC
  Filled 2023-02-17: qty 5

## 2023-02-17 MED ORDER — PROPOFOL 10 MG/ML IV BOLUS
INTRAVENOUS | Status: AC
  Filled 2023-02-17: qty 20

## 2023-02-17 MED ORDER — DEXAMETHASONE SODIUM PHOSPHATE 10 MG/ML IJ SOLN
INTRAMUSCULAR | Status: AC
  Filled 2023-02-17: qty 1

## 2023-02-17 MED ORDER — PHENYLEPHRINE 80 MCG/ML (10ML) SYRINGE FOR IV PUSH (FOR BLOOD PRESSURE SUPPORT)
PREFILLED_SYRINGE | INTRAVENOUS | Status: AC
  Filled 2023-02-17: qty 10

## 2023-02-17 MED ORDER — LIDOCAINE HCL (PF) 1 % IJ SOLN
INTRAMUSCULAR | Status: AC
  Filled 2023-02-17: qty 30

## 2023-02-17 MED ORDER — ONDANSETRON HCL 4 MG/2ML IJ SOLN
INTRAMUSCULAR | Status: DC | PRN
  Administered 2023-02-17: 4 mg via INTRAVENOUS

## 2023-02-17 SURGICAL SUPPLY — 39 items
ADH SKN CLS APL DERMABOND .7 (GAUZE/BANDAGES/DRESSINGS) ×1
APL PRP STRL LF DISP 70% ISPRP (MISCELLANEOUS) ×1
BAG COUNTER SPONGE SURGICOUNT (BAG) IMPLANT
BAG SPNG CNTER NS LX DISP (BAG)
CHLORAPREP W/TINT 26 (MISCELLANEOUS) ×1 IMPLANT
COVER SURGICAL LIGHT HANDLE (MISCELLANEOUS) ×1 IMPLANT
DERMABOND ADVANCED .7 DNX12 (GAUZE/BANDAGES/DRESSINGS) IMPLANT
DRAIN CHANNEL 19F RND (DRAIN) IMPLANT
DRAPE LAPAROSCOPIC ABDOMINAL (DRAPES) IMPLANT
DRAPE LAPAROTOMY T 102X78X121 (DRAPES) IMPLANT
DRAPE LAPAROTOMY TRNSV 102X78 (DRAPES) IMPLANT
DRAPE SHEET LG 3/4 BI-LAMINATE (DRAPES) IMPLANT
DRSG TEGADERM 4X4.75 (GAUZE/BANDAGES/DRESSINGS) IMPLANT
ELECT REM PT RETURN 15FT ADLT (MISCELLANEOUS) ×1 IMPLANT
EVACUATOR SILICONE 100CC (DRAIN) IMPLANT
GAUZE SPONGE 2X2 8PLY STRL LF (GAUZE/BANDAGES/DRESSINGS) IMPLANT
GAUZE SPONGE 4X4 12PLY STRL (GAUZE/BANDAGES/DRESSINGS) IMPLANT
GLOVE BIOGEL PI IND STRL 7.0 (GLOVE) ×1 IMPLANT
GLOVE SURG ORTHO 8.0 STRL STRW (GLOVE) ×1 IMPLANT
GLOVE SURG SYN 7.5 E (GLOVE) ×1
GLOVE SURG SYN 7.5 PF PI (GLOVE) ×1 IMPLANT
GOWN STRL REUS W/ TWL XL LVL3 (GOWN DISPOSABLE) ×2 IMPLANT
GOWN STRL REUS W/TWL XL LVL3 (GOWN DISPOSABLE) ×2
KIT BASIN OR (CUSTOM PROCEDURE TRAY) ×1 IMPLANT
KIT TURNOVER KIT A (KITS) IMPLANT
MARKER SKIN DUAL TIP RULER LAB (MISCELLANEOUS) IMPLANT
NDL HYPO 25X1 1.5 SAFETY (NEEDLE) ×1 IMPLANT
NEEDLE HYPO 25X1 1.5 SAFETY (NEEDLE) ×1
NS IRRIG 1000ML POUR BTL (IV SOLUTION) ×1 IMPLANT
PACK GENERAL/GYN (CUSTOM PROCEDURE TRAY) ×1 IMPLANT
SPIKE FLUID TRANSFER (MISCELLANEOUS) IMPLANT
SPONGE T-LAP 4X18 ~~LOC~~+RFID (SPONGE) IMPLANT
STAPLER VISISTAT 35W (STAPLE) IMPLANT
STRIP CLOSURE SKIN 1/2X4 (GAUZE/BANDAGES/DRESSINGS) IMPLANT
SUT MNCRL AB 3-0 PS2 18 (SUTURE) IMPLANT
SUT VIC AB 2-0 SH 18 (SUTURE) IMPLANT
SUT VIC AB 3-0 SH 18 (SUTURE) IMPLANT
SYR CONTROL 10ML LL (SYRINGE) ×1 IMPLANT
TOWEL OR 17X26 10 PK STRL BLUE (TOWEL DISPOSABLE) ×1 IMPLANT

## 2023-02-17 NOTE — Op Note (Signed)
Operative Note  Pre-operative Diagnosis:  soft tissue mass of back  Post-operative Diagnosis:  same  Surgeon:  [name], MD  Assistant:  none   Procedure:  Excision soft tissue mass of back, 24 x 15 x 4 cm, subcutaneous  Anesthesia:  general  Estimated Blood Loss:  25 cc  Drains: 19 Fr Blake drain to subcutaneous space         Specimen: mass to pathology  Indications:  Patient is referred by Dr. Estill Bamberg for surgical evaluation and management of an enlarging soft tissue mass on the left back. Patient's primary care physician is Dr. Hillard Danker. Patient has noted an enlarging soft tissue mass on the left back for several years. This is gradually cause more discomfort making it difficult for the patient to lie down supine and also causing discomfort when in a hard seat or automotive seat. 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 We will try to obtain a copy of that report and images to review. Patient has had a previous soft tissue mass removed from the left upper extremity which was benign. She presents today to discuss surgical excision of the enlarging soft tissue mass from the left back.   Procedure:  The patient was seen in the pre-op holding area. The risks, benefits, complications, treatment options, and expected outcomes were previously discussed with the patient. The patient agreed with the proposed plan and has signed the informed consent form.  The patient was brought to the operating room by the surgical team, identified as Shannon Navarro and the procedure verified. A "time out" was completed and the above information confirmed.  Following administration of general anesthesia, the patient was turned to a right lateral decubitus position on the beanbag.  The patient was then prepped and draped in the usual aseptic fashion.  After ascertaining that an  adequate level of anesthesia been achieved, a longitudinal incision was made from cephalad to caudad over the mass on the upper left back.  Dissection was carried into the subcutaneous tissues using the electrocautery for hemostasis.  Skin flaps were developed circumferentially with a depth of approximately 1 cm of adipose tissue.  Dissection was carried to the margins of the mass which had been marked prior to surgery.  Hemostasis was achieved with the electrocautery.  The mass was then excised off of the underlying musculature of the chest wall using the electrocautery for hemostasis.  The entire mass was excised.  It weighed 408 g and measured 24 x 15 x 4 cm in size.  It was submitted to pathology for review.  Wound was irrigated with warm saline and good hemostasis noted throughout.  A 19 [place] Blake drain was brought in from an inferior lateral stab wound and secured to the skin with a 3-0 nylon suture.  Subcutaneous tissues were closed with deep dermal interrupted 2-0 Vicryl sutures.  Skin was closed with a running 3-0 Monocryl subcuticular suture.  Wound was washed and dried and Dermabond was applied as dressing.  Dressing was applied to the base of the drain.  Drain was placed to bulb suction.  Patient was awakened from anesthesia and transferred to the recovery room in stable condition.  The patient tolerated the procedure well.   [name], MD [facility] Surgery Office: [phone]

## 2023-02-17 NOTE — Anesthesia Procedure Notes (Signed)
Procedure Name: Intubation Date/Time: 02/17/2023 7:43 AM  Performed by: Ahmed Prima, CRNAPre-anesthesia Checklist: Patient identified, Emergency Drugs available, Suction available and Patient being monitored Patient Re-evaluated:Patient Re-evaluated prior to induction Oxygen Delivery Method: Circle system utilized Preoxygenation: Pre-oxygenation with 100% oxygen Induction Type: IV induction Ventilation: Mask ventilation without difficulty and Oral airway inserted - appropriate to patient size Laryngoscope Size: Mac and 3 Grade View: Grade II Tube type: Oral Tube size: 7.0 mm Number of attempts: 1 Airway Equipment and Method: Stylet, Oral airway and Video-laryngoscopy Placement Confirmation: ETT inserted through vocal cords under direct vision, positive ETCO2 and breath sounds checked- equal and bilateral Secured at: 22 (at the teeth) cm Tube secured with: Tape Dental Injury: Teeth and Oropharynx as per pre-operative assessment

## 2023-02-17 NOTE — Transfer of Care (Signed)
Immediate Anesthesia Transfer of Care Note  Patient: Shannon Navarro  Procedure(s) Performed: EXCISION SOFT TISSUE MASS LEFT BACK (Left)  Patient Location: PACU  Anesthesia Type:General  Level of Consciousness: awake, alert , oriented, and patient cooperative  Airway & Oxygen Therapy: Patient Spontanous Breathing  Post-op Assessment: Report given to RN and Post -op Vital signs reviewed and stable  Post vital signs: Reviewed and stable  Last Vitals:  Vitals Value Taken Time  BP 115/81 02/17/23 0945  Temp    Pulse 65 02/17/23 0946  Resp 16 02/17/23 0946  SpO2 99 % on RA 02/17/23 0946  Vitals shown include unfiled device data.  Last Pain:  Vitals:   02/17/23 0606  TempSrc:   PainSc: 0-No pain      Patients Stated Pain Goal: 5 (02/17/23 0606)  Complications: No notable events documented.

## 2023-02-17 NOTE — Anesthesia Postprocedure Evaluation (Signed)
Anesthesia Post Note  Patient: Shannon Navarro  Procedure(s) Performed: EXCISION SOFT TISSUE MASS LEFT BACK (Left)     Patient location during evaluation: PACU Anesthesia Type: General Level of consciousness: awake and alert Pain management: pain level controlled Vital Signs Assessment: post-procedure vital signs reviewed and stable Respiratory status: spontaneous breathing, nonlabored ventilation and respiratory function stable Cardiovascular status: stable and blood pressure returned to baseline Anesthetic complications: no   No notable events documented.  Last Vitals:  Vitals:   02/17/23 1000 02/17/23 1015  BP: 112/69 104/86  Pulse: (!) 58 71  Resp: 16 (!) 22  Temp:  (!) 36.4 C  SpO2: 98% 99%    Last Pain:  Vitals:   02/17/23 1015  TempSrc:   PainSc: 3                  Beryle Lathe

## 2023-02-17 NOTE — Interval H&P Note (Signed)
History and Physical Interval Note:  02/17/2023 7:24 AM  Shannon Navarro  has presented today for surgery, with the diagnosis of SOFT TISSUE MASS LEFT BACK.  The various methods of treatment have been discussed with the patient and family. After consideration of risks, benefits and other options for treatment, the patient has consented to    Procedure(s): EXCISION SOFT TISSUE MASS LEFT BACK (Left) as a surgical intervention.    The patient's history has been reviewed, patient examined, no change in status, stable for surgery.  I have reviewed the patient's chart and labs.  Questions were answered to the patient's satisfaction.    [name], MD [facility] practice Office: [phone]  [name]

## 2023-02-17 NOTE — Progress Notes (Signed)
Patient developing hematoma from surgery. I paged Dr. Gerrit Friends who called and ordered labs. He also wants her NPO after midnight in case he needs to do a procedure in the morning.

## 2023-02-17 NOTE — Progress Notes (Signed)
   02/17/23 2220  BiPAP/CPAP/SIPAP  $ Non-Invasive Home Ventilator  Initial  BiPAP/CPAP/SIPAP Pt Type Adult  BiPAP/CPAP/SIPAP Resmed  Mask Type Nasal mask (Pts. own)  Respiratory Rate 16 breaths/min  EPAP 10 cmH2O (Ramped from 5)  FiO2 (%) 21 %  Flow Rate 0 lpm  Patient Home Equipment Yes (Only hose/Nasal Mask)  Auto Titrate No  CPAP/SIPAP surface wiped down Yes   Pt. set up with CPAP(RemStar) for h/s use, brought in own hose/Nasal Mask, made aware to notify if needed.

## 2023-02-17 NOTE — Progress Notes (Signed)
Patient is concerned that JP drain isn't draining enough/drain doesn't work because there is blood in the tubing. I reassessed JP drain, there are no kinks and milked tubing and changed the bulb. I explained to the patient that sometimes the amount of drainage can slow down since earlier in the day she having a lot of output. Output since 1900 is 135 mls.

## 2023-02-17 NOTE — H&P (Signed)
REFERRING PHYSICIAN: Jackelyn Hoehn, MD  PROVIDER: Cathlin Buchan Myra Rude, MD   Chief Complaint: NEW PROBLEM (Soft tissue mass left back)  History of Present Illness:  Patient is referred by Dr. Estill Bamberg for surgical evaluation and management of an enlarging soft tissue mass on the left back. Patient's primary care physician is Dr. Hillard Danker. Patient has noted an enlarging soft tissue mass on the left back for several years. This is gradually cause more discomfort making it difficult for the patient to lie down supine and also causing discomfort when in a hard seat or automotive seat. 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 We will try to obtain a copy of that report and images to review. Patient has had a previous soft tissue mass removed from the left upper extremity which was benign. She presents today to discuss surgical excision of the enlarging soft tissue mass from the left back.  Review of Systems: A complete review of systems was obtained from the patient. I have reviewed this information and discussed as appropriate with the patient. See HPI as well for other ROS.  Review of Systems  Constitutional: Negative.  HENT: Negative.  Eyes: Negative.  Respiratory: Negative.  Cardiovascular: Negative.  Gastrointestinal: Negative.  Genitourinary: Negative.  Musculoskeletal: Positive for back pain.  Skin: Negative.  Neurological: Negative.  Endo/Heme/Allergies: Negative.  Psychiatric/Behavioral: Negative.    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 Sheliah Hatch  ESSURE TUBAL LIGATION  HYSTERECTOMY  knee surgery  tonsilectomy    Allergies  Allergen Reactions  Hydrocodone-Acetaminophen Nausea And Vomiting  COUGH SYRUP CAUSES VOMITING  Cefaclor Other (See Comments) and Rash  Ciprofloxacin Hcl Rash  Hands were red, skin was red  Codeine Nausea And Vomiting and Other (See Comments)  Esomeprazole Magnesium Other (See Comments)  Made legs ache  Hydrocodone Nausea And Vomiting and Other (See Comments)  Severe vomiting/ COUGH SYRUP CAUSE SEVERE VOMITING  Penicillins Rash  Sulfamethoxazole Rash   Current Outpatient Medications on File Prior to Visit  Medication Sig Dispense Refill  amLODIPine (NORVASC) 2.5 MG tablet Take 2.5 mg by mouth once daily  apixaban (ELIQUIS) 5 mg tablet Take 5 mg by mouth 2 (two) times daily  cetirizine (ZYRTEC) 10 MG tablet Take 10 mg by mouth once daily  famotidine (PEPCID) 40 MG tablet Take 1 tablet by mouth every morning before breakfast  fluticasone propionate (FLONASE) 50 mcg/actuation nasal spray Place into one nostril  losartan (COZAAR) 100 MG tablet Take 100 mg by mouth once daily (Patient not taking: Reported on 01/13/2023)  pantoprazole (PROTONIX) 40 MG DR tablet TAKE 1 TABLET BY MOUTH TWICE A DAY BEFORE A MEAL (Patient not taking: Reported on 01/13/2023)   No current facility-administered medications on file prior to visit.   Family History  Problem Relation Age of Onset  High blood pressure (Hypertension) Mother  Breast cancer Mother  Skin cancer Father  High blood pressure (Hypertension) Father  Coronary Artery Disease (Blocked arteries around heart) Father  Diabetes Father  Colon cancer Father    Social History   Tobacco Use  Smoking Status Never  Smokeless Tobacco Never    Social History   Socioeconomic History  Marital status:  Married  Tobacco Use  Smoking status: Never  Smokeless tobacco: Never  Substance and Sexual Activity  Alcohol use: Not Currently  Drug use: Never   Social Determinants of Health   Food Insecurity: No Food Insecurity (04/09/2022)  Received [facility] [place]  Hunger Vital Sign  Worried About Running Out of Food in the Last Year: Never true  Ran Out of Food in the Last Year: Never true  Transportation Needs: No Transportation Needs (04/09/2022)  Received from [facility], [place]  PRAPARE - Transportation  Lack of Transportation (Medical): No  Lack of Transportation (Non-Medical): No   Objective:   Vitals:  Weight: (!) 104.1 kg (229 lb 6.4 oz)  Height: 167.6 cm (5\' 6" )  PainSc: 4  PainLoc: Back   Body mass index is 37.03 kg/m.  Physical Exam   GENERAL APPEARANCE Comfortable, no acute issues Development: normal Gross deformities: none  SKIN Rash, lesions, ulcers: none Induration, erythema: none Nodules: none palpable  EYES Conjunctiva and lids: normal Pupils: equal and reactive  EARS, NOSE, MOUTH, THROAT External ears: no lesion or deformity External nose: no lesion or deformity Hearing: grossly normal  CHEST On the posterior chest overlying the left paraspinous musculature beginning at approximately the level of the mid scapula and extending inferiorly for approximately 20 to 25 cm is a soft tissue subcutaneous mass which in total measures approximately 25 x 12 x 10 cm in size. This does not have discrete margins on palpation but is consistent with a large lipoma. There are no overlying cutaneous changes. There is mild tenderness to manipulation.  ABDOMEN Not assessed  GENITOURINARY/RECTAL Not assessed  MUSCULOSKELETAL Station and gait: normal Digits and nails: no clubbing or cyanosis Muscle strength: grossly normal all extremities Range of motion: grossly normal all extremities Deformity: none  PSYCHIATRIC Oriented to person, place,  and time: yes Mood and affect: normal for situation Judgment and insight: appropriate for situation   Assessment and Plan:   Mass on back  Patient is referred by her orthopedic surgeon for surgical evaluation and management of an enlarging soft tissue mass on the left back.  Today we discussed surgical resection. We discussed the size and location of the surgical incision. We discussed that a distinct possibility of a drain placed at the time of surgery which would need to remain for 3 to 5 days following her procedure. We discussed potential complications including infection and seroma formation. We discussed restrictions on her activities after surgery. Given the size of the mass and the need for a drain to be placed at the end of the procedure, we would likely do this as an overnight stay procedure at the hospital.  Patient understands and agrees to proceed. We will also obtain the report for her MRI scan prior to surgery.   [name], MD [facility] practice Office: [phone]

## 2023-02-18 ENCOUNTER — Encounter (HOSPITAL_COMMUNITY): Payer: Self-pay | Admitting: Surgery

## 2023-02-18 DIAGNOSIS — D171 Benign lipomatous neoplasm of skin and subcutaneous tissue of trunk: Secondary | ICD-10-CM | POA: Diagnosis not present

## 2023-02-18 LAB — SURGICAL PATHOLOGY

## 2023-02-18 MED ORDER — PANTOPRAZOLE SODIUM 40 MG PO TBEC
40.0000 mg | DELAYED_RELEASE_TABLET | Freq: Every day | ORAL | Status: DC
  Administered 2023-02-18: 40 mg via ORAL
  Filled 2023-02-18: qty 1

## 2023-02-18 NOTE — Progress Notes (Signed)
Pathology is benign as expected.

## 2023-02-18 NOTE — Plan of Care (Signed)
  Problem: Education: Goal: Knowledge of General Education information will improve Description Including pain rating scale, medication(s)/side effects and non-pharmacologic comfort measures Outcome: Progressing   Problem: Health Behavior/Discharge Planning: Goal: Ability to manage health-related needs will improve Outcome: Progressing   

## 2023-02-18 NOTE — Discharge Summary (Signed)
Physician Discharge Summary   Patient ID: Shannon Navarro MRN: [id number] DOB/AGE: 09-05-1955 67 y.o.  Admit date: 02/17/2023  Discharge date: 02/18/2023  Discharge Diagnoses:  Principal Problem:   Mass on back Active Problems:   Soft tissue mass   Discharged Condition: good  Hospital Course: Patient was admitted for observation following excision of soft tissue mass from left upper back.  Post op course was complicated by some bleeding with small hematoma formation.  Pain was well controlled.  Tolerated diet.  Patient was prepared for discharge home on POD#1.  Consults: None  Treatments: surgery: excision soft tissue mass left upper back  Discharge Exam: Blood pressure (!) 131/53, pulse (!) 50, temperature 97.7 F (36.5 C), temperature source Oral, resp. rate 18, height 5\' 6"  (1.676 m), weight 103.9 kg, SpO2 100%. Wound dry and intact; serosanguinous in drain bulb  Disposition: Home  Discharge Instructions     Diet - low sodium heart healthy   Complete by: As directed    Discharge wound care:   Complete by: As directed    Dressing change to drain site as needed.   Increase activity slowly   Complete by: As directed       Allergies as of 02/18/2023       Reactions   Cefaclor Rash, Other (See Comments)   Cipro [ciprofloxacin Hcl] Rash   Hands were red, skin was red    Codeine Nausea And Vomiting   Hydrocodone Nausea And Vomiting   Severe vomiting/  COUGH SYRUP CAUSE SEVERE VOMITING   Levaquin [levofloxacin] Nausea Only   Nexium [esomeprazole Magnesium] Other (See Comments)   Made legs ache   Penicillins Rash   Sulfa Antibiotics Rash        Medication List     TAKE these medications    amLODipine 2.5 MG tablet Commonly known as: NORVASC Take 1 tablet (2.5 mg total) by mouth daily.   fluticasone 50 MCG/ACT nasal spray Commonly known as: FLONASE Place 2 sprays into both nostrils daily. What changed:  when to take this reasons to take this    loratadine 10 MG tablet Commonly known as: CLARITIN Take 10 mg by mouth in the morning.   MAGNESIUM PO Take 1 capsule by mouth 2 (two) times a week.   metoprolol tartrate 25 MG tablet Commonly known as: LOPRESSOR Take 0.5 tablets (12.5 mg total) by mouth daily. What changed:  when to take this reasons to take this   omeprazole 20 MG tablet Commonly known as: PRILOSEC OTC Take 20 mg by mouth daily before breakfast.   rivaroxaban 20 MG Tabs tablet Commonly known as: XARELTO Take 1 tablet (20 mg total) by mouth daily with supper. What changed: when to take this   VITAMIN D PO Take 1 tablet by mouth in the morning. Gummy               Discharge Care Instructions  (From admission, onward)           Start     Ordered   02/18/23 0000  Discharge wound care:       Comments: Dressing change to drain site as needed.   02/18/23 1217            Follow-up Information     [name], MD. Schedule an appointment as soon as possible for a visit in 1 week(s).   Specialty: General Surgery Why: For wound re-check Contact information: [address] [place] [address] [phone]  [name], MD [facility] Office: [phone]   Signed: [name] 02/18/2023, 12:17 PM

## 2023-02-18 NOTE — Progress Notes (Signed)
Assessment & Plan: POD#1 - status post excision soft tissue mass left back - some post op bleeding from wound controlled by drain - labs: Hgb 13, coags nl - denies pain  Will instruct in drain care this AM.  Will resume diet and monitor output next few hours.  If stable, plan discharge later today.        Shannon Level, MD [facility] practice Office: [phone]        Chief Complaint: Soft tissue mass of back  Subjective: Patient up in chair, ambulatory.  No pain.  Objective: Vital signs in last 24 hours: Temp:  [97.4 F (36.3 C)-98.3 F (36.8 C)] 97.7 F (36.5 C) (10/09 5621) Pulse Rate:  [51-91] 56 (10/09 0632) Resp:  [11-22] 18 (10/09 3086) BP: (104-153)/(54-96) 133/54 (10/09 5784) SpO2:  [98 %-100 %] 100 % (10/09 0632) FiO2 (%):  [21 %] 21 % (10/08 2220) Last BM Date : 02/16/23  Intake/Output from previous day: 10/08 0701 - 10/09 0700 In: 1700 [P.O.:600; I.V.:1100] Out: 3425 [Urine:2800; Drains:575; Blood:50] Intake/Output this shift: No intake/output data recorded.  Physical Exam: Wound dry and intact; small hematoma, soft; drain with dark, venous output  Lab Results:  Recent Labs    02/17/23 1914  WBC 10.1  HGB 13.3  HCT 40.3  PLT 246   BMET No results for input(s): "NA", "K", "CL", "CO2", "GLUCOSE", "BUN", "CREATININE", "CALCIUM" in the last 72 hours. PT/INR Recent Labs    02/17/23 1914  LABPROT 14.0  INR 1.1   Comprehensive Metabolic Panel:    Component Value Date/Time   NA 140 02/10/2023 1441   NA 141 09/23/2022 1215   K 4.2 02/10/2023 1441   K 3.6 09/23/2022 1215   CL 108 02/10/2023 1441   CL 106 09/23/2022 1215   CO2 26 02/10/2023 1441   CO2 24 09/23/2022 1215   BUN 11 02/10/2023 1441   BUN 17 09/23/2022 1215   CREATININE 0.75 02/10/2023 1441   CREATININE 0.74 09/23/2022 1215   GLUCOSE 96 02/10/2023 1441   GLUCOSE 89 09/23/2022 1215   CALCIUM 9.0 02/10/2023 1441   CALCIUM 9.4 09/23/2022 1215    AST 16 08/12/2022 0923   AST 20 05/16/2022 1154   ALT 16 08/12/2022 0923   ALT 30 05/16/2022 1154   ALKPHOS 78 08/12/2022 0923   ALKPHOS 88 05/16/2022 1154   BILITOT 1.1 08/12/2022 0923   BILITOT 0.8 05/16/2022 1154   PROT 7.3 08/12/2022 0923   PROT 7.0 05/16/2022 1154   ALBUMIN 4.3 08/12/2022 0923   ALBUMIN 3.9 05/16/2022 1154    Studies/Results: No results found.    Shannon Navarro 02/18/2023  Patient ID: Shannon Navarro, female   DOB: [birth year], 67 y.o.   MRN: [id number]

## 2023-02-18 NOTE — Discharge Instructions (Signed)
  [facility] -- DISCHARGE INSTRUCTIONS  REMINDER:   Carry a list of your medications and allergies with you at all times  Call your pharmacy at least 1 week in advance to refill prescriptions  Do not mix any prescribed pain medicine with alcohol  Do not drive any motor vehicles while taking pain medication  Take medications with food unless otherwise directed  Follow-up appointments (date to return to physician): Please call [phone] to confirm your follow up appointment with your surgeon.  Call your Surgeon if you have:  Temperature greater than 101.0  Persistent nausea and vomiting  Severe uncontrolled pain  Redness, tenderness, or signs of infection (pain, swelling, redness, odor or green/yellow discharge around the site)  Difficulty breathing, headache or visual disturbances  Hives  Persistent dizziness or light-headedness  Any other questions or concerns you may have after discharge  In an emergency, call 911 or go to an Emergency Department at a nearby hospital.  Diet: Begin with liquids, and if they are tolerated, resume your usual diet.  Avoid spicy, greasy or heavy foods.  If you have nausea or vomiting, go back to liquids.  If you cannot keep liquids down, call your doctor.  Avoid alcohol consumption while on prescription pain medications. Good nutrition promotes healing. Increase fiber and fluids.   ADDITIONAL INSTRUCTIONS: May shower.  Apply dry dressing to drain after shower.  Leave Dermabond in place 7-10 days.  Drain care as instructed.  [facility] Office: [phone]

## 2023-02-18 NOTE — Progress Notes (Signed)
Patient was given discharge instructions and teaching for JP drain. Patient demonstrated understanding of how to empty JP. All questions were answered. Patient was stable for discharge and was walked to the main exit.

## 2023-02-18 NOTE — Progress Notes (Signed)
   02/18/23 0841  TOC Brief Assessment  Insurance and Status Reviewed  Patient has primary care physician Yes  Home environment has been reviewed Resides with spouse  Prior level of function: Independent at baseline  Prior/Current Home Services No current home services  Social Determinants of Health Reivew SDOH reviewed no interventions necessary  Readmission risk has been reviewed Yes  Transition of care needs no transition of care needs at this time

## 2023-02-23 ENCOUNTER — Ambulatory Visit: Payer: Medicare Other | Admitting: Internal Medicine

## 2023-02-23 ENCOUNTER — Other Ambulatory Visit: Payer: Self-pay

## 2023-02-23 ENCOUNTER — Emergency Department (HOSPITAL_COMMUNITY)
Admission: EM | Admit: 2023-02-23 | Discharge: 2023-02-23 | Disposition: A | Discharge status: Home / Self Care | Payer: Medicare Other | Attending: Emergency Medicine | Admitting: Emergency Medicine

## 2023-02-23 ENCOUNTER — Encounter (HOSPITAL_COMMUNITY): Payer: Self-pay

## 2023-02-23 DIAGNOSIS — T8140XA Infection following a procedure, unspecified, initial encounter: Secondary | ICD-10-CM

## 2023-02-23 DIAGNOSIS — Z7901 Long term (current) use of anticoagulants: Secondary | ICD-10-CM | POA: Diagnosis not present

## 2023-02-23 DIAGNOSIS — R21 Rash and other nonspecific skin eruption: Secondary | ICD-10-CM | POA: Diagnosis present

## 2023-02-23 DIAGNOSIS — G8918 Other acute postprocedural pain: Secondary | ICD-10-CM | POA: Diagnosis not present

## 2023-02-23 LAB — CBC WITH DIFFERENTIAL/PLATELET
Abs Immature Granulocytes: 0.02 10*3/uL (ref 0.00–0.07)
Basophils Absolute: 0.1 10*3/uL (ref 0.0–0.1)
Basophils Relative: 1 %
Eosinophils Absolute: 0.5 10*3/uL (ref 0.0–0.5)
Eosinophils Relative: 7 %
HCT: 38.9 % (ref 36.0–46.0)
Hemoglobin: 12.7 g/dL (ref 12.0–15.0)
Immature Granulocytes: 0 %
Lymphocytes Relative: 17 %
Lymphs Abs: 1.2 10*3/uL (ref 0.7–4.0)
MCH: 29.5 pg (ref 26.0–34.0)
MCHC: 32.6 g/dL (ref 30.0–36.0)
MCV: 90.5 fL (ref 80.0–100.0)
Monocytes Absolute: 0.6 10*3/uL (ref 0.1–1.0)
Monocytes Relative: 9 %
Neutro Abs: 4.8 10*3/uL (ref 1.7–7.7)
Neutrophils Relative %: 66 %
Platelets: 283 10*3/uL (ref 150–400)
RBC: 4.3 MIL/uL (ref 3.87–5.11)
RDW: 13 % (ref 11.5–15.5)
WBC: 7.2 10*3/uL (ref 4.0–10.5)
nRBC: 0 % (ref 0.0–0.2)

## 2023-02-23 NOTE — Discharge Instructions (Signed)
Follow-up with your surgeon this week as planned

## 2023-02-23 NOTE — ED Triage Notes (Addendum)
Patient stated last Tuesday she had a fatty mass removed on her upper back. Stated her stitches site is swollen and red and oozing yellow drainage today. Has a JP drain.

## 2023-02-23 NOTE — Progress Notes (Signed)
Patient ID: Shannon Navarro, female   DOB: [birth year], 67 y.o.   MRN: [id number]      Patient is well-known to me having undergone excision of a soft tissue mass from the left upper back.  Patient has a drain in place in the subcutaneous space.  She has a small hematoma postoperatively.  Patient developed erythema around the wound last week and was started on oral Keflex.  She continues to monitor drain output.  Patient is seen and evaluated in the emergency department.  She is allergic to Cipro and Augmentin and sulfa antibiotics.  Therefore we will continue with cephalexin which she is currently taking.  We will check a CBC just to make sure her WBC is normal and that her hemoglobin remains normal.  I arranged for a new bulb for the JP drain to be sent from the operating room to the emergency department and they will change the bulb.  Patient may be discharged home from the ER.  I will see her in the office on Thursday of this week for a wound check.  [name], MD [facility] practice Office: [phone]

## 2023-02-23 NOTE — ED Provider Notes (Signed)
[facility] EMERGENCY DEPARTMENT AT [facility] Provider Note   CSN: [id number] Arrival date & time: 02/23/23  4098     History {Add pertinent medical, surgical, social history, OB history to HPI:1} Chief Complaint  Patient presents with   Post-op Problem    Shannon Navarro is a 68 y.o. female.  Patient had a mass removed from her back last week.  She is developed a hematoma and is also complaining of pain and redness.  She is on Keflex   Rash      Home Medications Prior to Admission medications   Medication Sig Start Date End Date Taking? Authorizing Provider  amLODipine (NORVASC) 2.5 MG tablet Take 1 tablet (2.5 mg total) by mouth daily. 04/21/22   Myrlene Broker, MD  fluticasone Aleda Grana) 50 MCG/ACT nasal spray Place 2 sprays into both nostrils daily. Patient taking differently: Place 2 sprays into both nostrils daily as needed for allergies. 02/18/21   Myrlene Broker, MD  loratadine (CLARITIN) 10 MG tablet Take 10 mg by mouth in the morning.    [provider]  MAGNESIUM PO Take 1 capsule by mouth 2 (two) times a week.    [provider]  metoprolol tartrate (LOPRESSOR) 25 MG tablet Take 0.5 tablets (12.5 mg total) by mouth daily. Patient taking differently: Take 12.5 mg by mouth daily as needed (palpitation/afib). 12/29/22   Rollene Rotunda, MD  omeprazole (PRILOSEC OTC) 20 MG tablet Take 20 mg by mouth daily before breakfast.    [provider]  rivaroxaban (XARELTO) 20 MG TABS tablet Take 1 tablet (20 mg total) by mouth daily with supper. Patient taking differently: Take 20 mg by mouth in the morning. 01/19/23   Rollene Rotunda, MD  VITAMIN D PO Take 1 tablet by mouth in the morning. Gummy    [provider]      Allergies    Cefaclor, Cipro [ciprofloxacin hcl], Codeine, Hydrocodone, Levaquin [levofloxacin], Nexium [esomeprazole magnesium], Penicillins, and Sulfa antibiotics    Review of Systems   Review  of Systems  Skin:  Positive for rash.    Physical Exam Updated Vital Signs BP 132/64 (BP Location: Right Arm)   Pulse (!) 50   Temp 98.4 F (36.9 C) (Oral)   Resp 16   Ht 5\' 6"  (1.676 m)   Wt 101.6 kg   SpO2 100%   BMI 36.15 kg/m  Physical Exam  ED Results / Procedures / Treatments   Labs (all labs ordered are listed, but only abnormal results are displayed) Labs Reviewed  CBC WITH DIFFERENTIAL/PLATELET    EKG None  Radiology No results found.  Procedures Procedures  {Document cardiac monitor, telemetry assessment procedure when appropriate:1}  Medications Ordered in ED Medications - No data to display  ED Course/ Medical Decision Making/ A&P  Patient had a CBC that was normal and was seen by general surgery.  They will continue Keflex and see you Thursday {   Click here for ABCD2, HEART and other calculatorsREFRESH Note before signing :1}                              Medical Decision Making Amount and/or Complexity of Data Reviewed Labs: ordered.   Hematoma and possible cellulitis to her back  {Document critical care time when appropriate:1} {Document review of labs and clinical decision tools ie heart score, Chads2Vasc2 etc:1}  {Document your independent review of radiology images, and any outside records:1} {  Document your discussion with family members, caretakers, and with consultants:1} {Document social determinants of health affecting pt's care:1} {Document your decision making why or why not admission, treatments were needed:1} Final Clinical Impression(s) / ED Diagnoses Final diagnoses:  None    Rx / DC Orders ED Discharge Orders     None

## 2023-02-26 DIAGNOSIS — L7634 Postprocedural seroma of skin and subcutaneous tissue following other procedure: Secondary | ICD-10-CM | POA: Insufficient documentation

## 2023-02-27 ENCOUNTER — Emergency Department (HOSPITAL_COMMUNITY)
Admission: EM | Admit: 2023-02-27 | Discharge: 2023-02-27 | Disposition: A | Discharge status: Home / Self Care | Payer: Medicare Other

## 2023-02-27 ENCOUNTER — Encounter (HOSPITAL_COMMUNITY): Payer: Self-pay

## 2023-02-27 ENCOUNTER — Other Ambulatory Visit: Payer: Self-pay

## 2023-02-27 ENCOUNTER — Emergency Department (HOSPITAL_COMMUNITY): Payer: Medicare Other

## 2023-02-27 DIAGNOSIS — S0990XA Unspecified injury of head, initial encounter: Secondary | ICD-10-CM | POA: Insufficient documentation

## 2023-02-27 DIAGNOSIS — I4891 Unspecified atrial fibrillation: Secondary | ICD-10-CM | POA: Insufficient documentation

## 2023-02-27 DIAGNOSIS — Z7901 Long term (current) use of anticoagulants: Secondary | ICD-10-CM | POA: Insufficient documentation

## 2023-02-27 DIAGNOSIS — W228XXA Striking against or struck by other objects, initial encounter: Secondary | ICD-10-CM | POA: Diagnosis not present

## 2023-02-27 NOTE — ED Triage Notes (Signed)
Pt had a tall lamp fall and hit the left side of her head. Pt endorses numbness in the area it hit, denies any other s/s of stroke, LOC, or changes to vision. Pt does take Xeralto.

## 2023-02-27 NOTE — ED Provider Notes (Signed)
[place] EMERGENCY DEPARTMENT AT [facility] Provider Note   CSN: [id number] Arrival date & time: 02/27/23  1248     History Chief Complaint  Patient presents with   Head Injury    Shannon Navarro is a 67 y.o. female.  Patient past history of atrial fibrillation presents the emergency department concerns of a head injury.  Reports that she had a lamp fall against her hand while she was sitting on a couch and struck the left side of her head.  Denies any numbness or weakness and any side of her body.  But does endorse some numbness to the area that was struck by the lamp.  Does currently take Xarelto.  Denies loss of consciousness or vision changes following the injury.  No headache or nausea or vomiting.   Head Injury      Home Medications Prior to Admission medications   Medication Sig Start Date End Date Taking? Authorizing Provider  amLODipine (NORVASC) 2.5 MG tablet Take 1 tablet (2.5 mg total) by mouth daily. 04/21/22   Myrlene Broker, MD  fluticasone Aleda Grana) 50 MCG/ACT nasal spray Place 2 sprays into both nostrils daily. Patient taking differently: Place 2 sprays into both nostrils daily as needed for allergies. 02/18/21   Myrlene Broker, MD  loratadine (CLARITIN) 10 MG tablet Take 10 mg by mouth in the morning.    [provider]  MAGNESIUM PO Take 1 capsule by mouth 2 (two) times a week.    [provider]  metoprolol tartrate (LOPRESSOR) 25 MG tablet Take 0.5 tablets (12.5 mg total) by mouth daily. Patient taking differently: Take 12.5 mg by mouth daily as needed (palpitation/afib). 12/29/22   Rollene Rotunda, MD  omeprazole (PRILOSEC OTC) 20 MG tablet Take 20 mg by mouth daily before breakfast.    [provider]  rivaroxaban (XARELTO) 20 MG TABS tablet Take 1 tablet (20 mg total) by mouth daily with supper. Patient taking differently: Take 20 mg by mouth in the morning. 01/19/23   Rollene Rotunda, MD  VITAMIN D PO  Take 1 tablet by mouth in the morning. Gummy    [provider]      Allergies    Cefaclor, Cipro [ciprofloxacin hcl], Codeine, Hydrocodone, Levaquin [levofloxacin], Nexium [esomeprazole magnesium], Penicillins, and Sulfa antibiotics    Review of Systems   Review of Systems  HENT:         Head pain  All other systems reviewed and are negative.   Physical Exam Updated Vital Signs BP (!) 147/68 (BP Location: Right Arm)   Pulse (!) 55   Temp 98.2 F (36.8 C) (Oral)   Resp 18   Ht 5\' 6"  (1.676 m)   Wt 102.1 kg   SpO2 100%   BMI 36.32 kg/m  Physical Exam Vitals and nursing note reviewed.  Constitutional:      General: She is not in acute distress.    Appearance: She is well-developed.  HENT:     Head: Normocephalic. Abrasion present.     Comments: Abrasion to left frontal scalp area, no obvious bruising, laceration, and only minimal swelling observed over frontal scalp. Eyes:     Conjunctiva/sclera: Conjunctivae normal.  Cardiovascular:     Rate and Rhythm: Normal rate and regular rhythm.     Heart sounds: No murmur heard. Pulmonary:     Effort: Pulmonary effort is normal. No respiratory distress.     Breath sounds: Normal breath sounds.  Abdominal:     Palpations:  Abdomen is soft.     Tenderness: There is no abdominal tenderness.  Musculoskeletal:        General: No swelling.     Cervical back: Neck supple.  Skin:    General: Skin is warm and dry.     Capillary Refill: Capillary refill takes less than 2 seconds.  Neurological:     Mental Status: She is alert.  Psychiatric:        Mood and Affect: Mood normal.     ED Results / Procedures / Treatments   Labs (all labs ordered are listed, but only abnormal results are displayed) Labs Reviewed - No data to display  EKG None  Radiology CT Head Wo Contrast  Result Date: 02/27/2023 CLINICAL DATA:  Head trauma, moderate-severe EXAM: CT HEAD WITHOUT CONTRAST TECHNIQUE: Contiguous axial images were  obtained from the base of the skull through the vertex without intravenous contrast. RADIATION DOSE REDUCTION: This exam was performed according to the departmental dose-optimization program which includes automated exposure control, adjustment of the mA and/or kV according to patient size and/or use of iterative reconstruction technique. COMPARISON:  None Available. FINDINGS: Brain: No evidence of acute infarction, hemorrhage, hydrocephalus, extra-axial collection or mass lesion/mass effect. Vascular: No hyperdense vessel or unexpected calcification. Skull: Normal. Negative for fracture or focal lesion. Sinuses/Orbits: No acute finding. Other: None. IMPRESSION: No acute intracranial abnormality. Electronically Signed   By: Duanne Guess D.O.   On: 02/27/2023 17:33    Procedures Procedures   Medications Ordered in ED Medications - No data to display  ED Course/ Medical Decision Making/ A&P                               Medical Decision Making Amount and/or Complexity of Data Reviewed Radiology: ordered.   This patient presents to the ED for concern of head injury.  Differential diagnosis includes concussion, SAH, head injury   Imaging Studies ordered:  I ordered imaging studies including CT head I independently visualized and interpreted imaging which showed no acute intracranial abnormalities I agree with the radiologist interpretation   Problem List / ED Course:  Patient presents emergency department concerns of a head injury.  Reports that she had a tall line which was heavy fall on the left side of her head.  Endorses some numbness to the area that was hit but denies any other symptoms at this time.  No loss of consciousness, vision changes, nausea, vomiting or headaches.  He is currently on Xarelto for atrial fibrillation.  After careful discussion with patient on benefits of CT imaging or if necessary at this time given degree of impact, decided to perform CT of head for  evaluation of injury.  Minimal concern at this time the patient is having any intracranial bleeding or hemorrhage.  No acute neurological deficits were observed during physical exam.  Will reassess patient shortly. Patient with negative head CT. No acute abnormalities. Will discharge with plans for close PCP follow up. Discharged home in stable condition.  Final Clinical Impression(s) / ED Diagnoses Final diagnoses:  Minor head injury, initial encounter    Rx / DC Orders ED Discharge Orders     None         Smitty Knudsen, PA-C 02/27/23 1743    Durwin Glaze, MD 02/27/23 2315

## 2023-02-27 NOTE — Discharge Instructions (Addendum)
You were seen today for concerns of a head injury after a lamp struck you in the head. Your CT imaging of your head was reassuring thankfully. I would monitor symptoms for the next few days and return if you develop severe headaches or vision changes. Otherwise follow up with your primary care provider.

## 2023-03-14 ENCOUNTER — Emergency Department (HOSPITAL_BASED_OUTPATIENT_CLINIC_OR_DEPARTMENT_OTHER)
Admission: EM | Admit: 2023-03-14 | Discharge: 2023-03-14 | Disposition: A | Discharge status: Home / Self Care | Payer: Medicare Other | Attending: Emergency Medicine | Admitting: Emergency Medicine

## 2023-03-14 ENCOUNTER — Other Ambulatory Visit: Payer: Self-pay

## 2023-03-14 ENCOUNTER — Encounter (HOSPITAL_BASED_OUTPATIENT_CLINIC_OR_DEPARTMENT_OTHER): Payer: Self-pay | Admitting: Emergency Medicine

## 2023-03-14 DIAGNOSIS — T8140XA Infection following a procedure, unspecified, initial encounter: Secondary | ICD-10-CM | POA: Diagnosis present

## 2023-03-14 DIAGNOSIS — L0889 Other specified local infections of the skin and subcutaneous tissue: Secondary | ICD-10-CM | POA: Diagnosis not present

## 2023-03-14 DIAGNOSIS — L089 Local infection of the skin and subcutaneous tissue, unspecified: Secondary | ICD-10-CM

## 2023-03-14 MED ORDER — CEPHALEXIN 500 MG PO CAPS: 500.0000 mg | ORAL_CAPSULE | Freq: Four times a day (QID) | ORAL | 0 refills | Status: DC

## 2023-03-14 NOTE — ED Triage Notes (Signed)
Pt arrived POV from home, caox4, ambulatory, NAD stating she is concerned with the appearance of drainage that is coming from her JP drain. Pt reports since yesterday she has noticed the drainage has become thicker and appears more like pus. Pt denies any increase in pain or fevers at home.

## 2023-03-14 NOTE — Discharge Instructions (Signed)
Watch for worsening signs of infection.  Watch the drainage.  Watch for fevers.  Call the surgeons or return to the ER if worsening symptoms develop.  Call the office on Monday.

## 2023-03-14 NOTE — ED Notes (Addendum)
ED Provider at bedside. 

## 2023-03-14 NOTE — ED Notes (Signed)
 RN reviewed discharge instructions with pt. Pt verbalized understanding and had no further questions. VSS upon discharge.  

## 2023-03-14 NOTE — ED Provider Notes (Signed)
[place] EMERGENCY DEPARTMENT AT [facility] Provider Note   CSN: [id number] Arrival date & time: 03/14/23  1709     History  Chief Complaint  Patient presents with   Wound Infection    Shannon Navarro is a 67 y.o. female.  HPI Patient worried about decreased drainage and changes in drainage from her JP drain on her left back.  Had a lipoma removed.  States drain is now having purulent drainage.  No fevers.  No chills.  States has had somewhat decreased drainage.   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   History of esophageal stricture    mainly due to gerd   History of exercise stress test 06/13/2015   PER DR HOCHREIN NOTE -- NO EVIDENCE ISCHEMIA   HYPERTENSION    Impaired fasting glucose    Migraine    hx of migraines none recent   OSA on CPAP followed by dr Craige Cotta   per study 02-09-2012  severe osa w/ AHI 33.8   Paroxysmal atrial fibrillation (HCC) cardioloigst-  dr Antoine Poche   dx 02-21-2016 at ED   Pneumonia    with COVID   PONV (postoperative nausea and vomiting)    after surgery in 10/2021- had nausea after surgery related to preop drink   Pre-diabetes    Pseudotumor cerebri 2007   S/P cervical spinal fusion 10/24/2021   Sleep apnea    Vitamin D deficiency 07/2013    Home Medications Prior to Admission medications   Medication Sig Start Date End Date Taking? Authorizing Provider  cephALEXin (KEFLEX) 500 MG capsule Take 1 capsule (500 mg total) by mouth 4 (four) times daily. 03/14/23  Yes Benjiman Core, MD  amLODipine (NORVASC) 2.5 MG tablet Take 1 tablet (2.5 mg total) by mouth daily. 04/21/22   Myrlene Broker, MD  fluticasone Aleda Grana) 50 MCG/ACT nasal spray Place 2 sprays into both nostrils daily. Patient taking  differently: Place 2 sprays into both nostrils daily as needed for allergies. 02/18/21   Myrlene Broker, MD  loratadine (CLARITIN) 10 MG tablet Take 10 mg by mouth in the morning.    [provider]  MAGNESIUM PO Take 1 capsule by mouth 2 (two) times a week.    [provider]  metoprolol tartrate (LOPRESSOR) 25 MG tablet Take 0.5 tablets (12.5 mg total) by mouth daily. Patient taking differently: Take 12.5 mg by mouth daily as needed (palpitation/afib). 12/29/22   Rollene Rotunda, MD  omeprazole (PRILOSEC OTC) 20 MG tablet Take 20 mg by mouth daily before breakfast.    [provider]  rivaroxaban (XARELTO) 20 MG TABS tablet Take 1 tablet (20 mg total) by mouth daily with supper. Patient taking differently: Take 20 mg by mouth in the morning. 01/19/23   Rollene Rotunda, MD  VITAMIN D PO Take 1 tablet by mouth in the morning. Gummy    [provider]      Allergies    Cefaclor, Cipro [ciprofloxacin hcl], Codeine, Hydrocodone, Levaquin [levofloxacin], Nexium [esomeprazole magnesium], Penicillins, and Sulfa antibiotics    Review of Systems   Review of Systems  Physical Exam Updated Vital Signs BP (!) 146/70 (BP Location: Right Arm)   Pulse 61  Temp 98.7 F (37.1 C)   Resp 18   Ht 5\' 6"  (1.676 m)   Wt 102.1 kg   SpO2 100%   BMI 36.32 kg/m  Physical Exam Vitals and nursing note reviewed.  Skin:    Capillary Refill: Capillary refill takes less than 2 seconds.     Comments: Healing wound left back.  JP drain inferiorly.  Does have more purulent drainage now.  Some fullness inferior on the wound but states similar to prior.  Neurological:     Mental Status: She is alert.     ED Results / Procedures / Treatments   Labs (all labs ordered are listed, but only abnormal results are displayed) Labs Reviewed - No data to display  EKG None  Radiology No results found.  Procedures Procedures    Medications Ordered in ED Medications - No  data to display  ED Course/ Medical Decision Making/ A&P                                 Medical Decision Making Risk Prescription drug management.        Patient with change in her JP drainage postsurgery.  Differential diagnosis includes infection.  Still having some drainage but has decreased some.  No fevers.  Discussed with Dr. Doylene Canard, who is reviewed the images.  Will restart Keflex.  Has recently been on it and states she was doing well.  Did have reaction to the cleaner that had been used around the surgery.  Short-term follow-up with general surgery.  Will return for worsening symptoms.  I have reviewed general surgery notes.        Final Clinical Impression(s) / ED Diagnoses Final diagnoses:  Wound infection    Rx / DC Orders ED Discharge Orders          Ordered    cephALEXin (KEFLEX) 500 MG capsule  4 times daily        03/14/23 Crissie Figures, MD 03/14/23 1817

## 2023-03-17 ENCOUNTER — Ambulatory Visit: Payer: Self-pay | Admitting: Surgery

## 2023-03-17 NOTE — Progress Notes (Signed)
Surgery orders requested via Epic inbox. °

## 2023-03-18 ENCOUNTER — Other Ambulatory Visit: Payer: Self-pay

## 2023-03-18 ENCOUNTER — Encounter (HOSPITAL_COMMUNITY): Payer: Self-pay | Admitting: Surgery

## 2023-03-18 DIAGNOSIS — T8189XA Other complications of procedures, not elsewhere classified, initial encounter: Secondary | ICD-10-CM | POA: Diagnosis present

## 2023-03-18 NOTE — H&P (Signed)
     PROVIDER: Bonnetta Barry, MD  MRN: [id number] DOB: [birth year] DATE OF ENCOUNTER: 03/17/2023 Interval History:   Wound check. It has now been almost a month since she underwent excision of soft tissue mass from the left posterior back. Wound is continued to drain. She denies any significant pain. She denies fever. She denies chills. She denies sweats. She has been seen again in the emergency department and started back on Keflex. She finished her course of prednisone.  Physical Examination:   Incision is actually well-healed with a little bit of dry eschar. The area where the mass was located is now somewhat firm and thickened consistent with a chronic or organized hematoma. The drain site is clear. The drainage fluid is pink and cloudy and continues to be approximately 75 cc every 24 hours.  Assessment and Plan:   Mass on back Postoperative seroma of skin after non-dermatologic procedure  The patient and I are both frustrated with this wound at this point. I believe she likely has an organized hematoma and that the current drain is not going to be adequate to evacuate this anytime soon. Today we discussed options. I would like to take her back to the operating room on Thursday, November 7 for wound exploration and debridement. We discussed the possible placement of new drains with removal of the current drain versus the possibility of opening the wound more widely and packing the wound open with moist gauze dressings. This will be a decision that we will have to be made intraoperatively. We will also plan to obtain cultures and send these to the laboratory.  Patient is off of her anticoagulation at this point. She will remain so until the time of her surgery on Thursday.  [name], MD [facility] practice Office: [phone]

## 2023-03-18 NOTE — Progress Notes (Signed)
For Anesthesia: PCP - Myrlene Broker, MD  Cardiologist - Rollene Rotunda, MD    Chest x-ray - 09/23/2022 in [facility] EKG - 12/26/22 in [facility] Stress Test - greater than 2 years ECHO - 04/09/22 in [facility] Cardiac Cath - N/A Pacemaker/ICD device last checked: N/A Pacemaker orders received: N/A Device Rep notified: N/A  Spinal Cord Stimulator: N/A  Sleep Study - Yes CPAP - Yes  Fasting Blood Sugar - N/A Checks Blood Sugar ___N/A__ times a day Date and result of last Hgb A1c-N/A  Blood Thinner Instructions: N/A Aspirin Instructions: N/A Last Dose: N/A  Activity level: Can go up a flight of stairs and activities of daily living without stopping and without chest pain and/or shortness of breath    Anesthesia review: Paroxysmal A. Fib, HTN, OSA  Patient denies shortness of breath, fever, cough and chest pain during pre op phone call.   Patient verbalized understanding of instructions reviewed via telephone.

## 2023-03-19 ENCOUNTER — Ambulatory Visit (HOSPITAL_COMMUNITY): Payer: Medicare Other | Admitting: Anesthesiology

## 2023-03-19 ENCOUNTER — Ambulatory Visit (HOSPITAL_COMMUNITY)
Admission: RE | Admit: 2023-03-19 | Discharge: 2023-03-19 | Disposition: A | Discharge status: Home / Self Care | Payer: Medicare Other | Source: Ambulatory Visit | Attending: Surgery | Admitting: Surgery

## 2023-03-19 ENCOUNTER — Other Ambulatory Visit: Payer: Self-pay

## 2023-03-19 ENCOUNTER — Encounter (HOSPITAL_COMMUNITY)
Admission: RE | Disposition: A | Discharge status: Home / Self Care | Payer: Self-pay | Source: Ambulatory Visit | Attending: Surgery

## 2023-03-19 ENCOUNTER — Encounter (HOSPITAL_COMMUNITY): Payer: Self-pay | Admitting: Surgery

## 2023-03-19 ENCOUNTER — Ambulatory Visit (HOSPITAL_BASED_OUTPATIENT_CLINIC_OR_DEPARTMENT_OTHER): Payer: Self-pay | Admitting: Anesthesiology

## 2023-03-19 DIAGNOSIS — S21209A Unspecified open wound of unspecified back wall of thorax without penetration into thoracic cavity, initial encounter: Secondary | ICD-10-CM | POA: Diagnosis not present

## 2023-03-19 DIAGNOSIS — I10 Essential (primary) hypertension: Secondary | ICD-10-CM

## 2023-03-19 DIAGNOSIS — X58XXXA Exposure to other specified factors, initial encounter: Secondary | ICD-10-CM | POA: Insufficient documentation

## 2023-03-19 DIAGNOSIS — L7634 Postprocedural seroma of skin and subcutaneous tissue following other procedure: Secondary | ICD-10-CM | POA: Diagnosis not present

## 2023-03-19 DIAGNOSIS — R222 Localized swelling, mass and lump, trunk: Secondary | ICD-10-CM | POA: Diagnosis present

## 2023-03-19 DIAGNOSIS — T8189XA Other complications of procedures, not elsewhere classified, initial encounter: Secondary | ICD-10-CM | POA: Diagnosis present

## 2023-03-19 HISTORY — DX: Calculus of bile duct without cholangitis or cholecystitis without obstruction: K80.50

## 2023-03-19 HISTORY — DX: Nontoxic single thyroid nodule: E04.1

## 2023-03-19 HISTORY — PX: IRRIGATION AND DEBRIDEMENT ABSCESS: SHX5252

## 2023-03-19 LAB — BASIC METABOLIC PANEL
Anion gap: 11 (ref 5–15)
BUN: 11 mg/dL (ref 8–23)
CO2: 25 mmol/L (ref 22–32)
Calcium: 8.7 mg/dL — ABNORMAL LOW (ref 8.9–10.3)
Chloride: 105 mmol/L (ref 98–111)
Creatinine, Ser: 0.74 mg/dL (ref 0.44–1.00)
GFR, Estimated: >60 mL/min (ref >60)
Glucose, Bld: 126 mg/dL — ABNORMAL HIGH (ref 70–99)
Potassium: 3.9 mmol/L (ref 3.5–5.1)
Sodium: 141 mmol/L (ref 135–145)

## 2023-03-19 SURGERY — IRRIGATION AND DEBRIDEMENT ABSCESS
Anesthesia: General

## 2023-03-19 MED ORDER — ROCURONIUM BROMIDE 10 MG/ML (PF) SYRINGE
PREFILLED_SYRINGE | INTRAVENOUS | Status: AC
  Filled 2023-03-19: qty 10

## 2023-03-19 MED ORDER — OXYCODONE HCL 5 MG/5ML PO SOLN: 5.0000 mg | Freq: Once | ORAL | Status: DC | PRN

## 2023-03-19 MED ORDER — EPHEDRINE SULFATE (PRESSORS) 50 MG/ML IJ SOLN
INTRAMUSCULAR | Status: DC | PRN
  Administered 2023-03-19: 5 mg via INTRAVENOUS
  Administered 2023-03-19: 7.5 mg via INTRAVENOUS
  Administered 2023-03-19: 5 mg via INTRAVENOUS

## 2023-03-19 MED ORDER — DEXAMETHASONE SODIUM PHOSPHATE 10 MG/ML IJ SOLN
INTRAMUSCULAR | Status: AC
  Filled 2023-03-19: qty 1

## 2023-03-19 MED ORDER — ACETAMINOPHEN 500 MG PO TABS
1000.0000 mg | ORAL_TABLET | Freq: Once | ORAL | Status: AC
  Administered 2023-03-19: 1000 mg via ORAL
  Filled 2023-03-19: qty 2

## 2023-03-19 MED ORDER — LIDOCAINE HCL (CARDIAC) PF 100 MG/5ML IV SOSY
PREFILLED_SYRINGE | INTRAVENOUS | Status: DC | PRN
  Administered 2023-03-19: 60 mg via INTRAVENOUS

## 2023-03-19 MED ORDER — CHLORHEXIDINE GLUCONATE CLOTH 2 % EX PADS: 6.0000 | MEDICATED_PAD | Freq: Once | CUTANEOUS | Status: DC

## 2023-03-19 MED ORDER — ONDANSETRON HCL 4 MG/2ML IJ SOLN
INTRAMUSCULAR | Status: AC
  Filled 2023-03-19: qty 2

## 2023-03-19 MED ORDER — LIDOCAINE HCL (PF) 2 % IJ SOLN
INTRAMUSCULAR | Status: AC
  Filled 2023-03-19: qty 15

## 2023-03-19 MED ORDER — MIDAZOLAM HCL 5 MG/5ML IJ SOLN
INTRAMUSCULAR | Status: DC | PRN
  Administered 2023-03-19: 2 mg via INTRAVENOUS

## 2023-03-19 MED ORDER — HYDROMORPHONE HCL 1 MG/ML IJ SOLN: 0.2500 mg | INTRAMUSCULAR | Status: DC | PRN

## 2023-03-19 MED ORDER — AMISULPRIDE (ANTIEMETIC) 5 MG/2ML IV SOLN: 10.0000 mg | Freq: Once | INTRAVENOUS | Status: DC | PRN

## 2023-03-19 MED ORDER — LACTATED RINGERS IV SOLN: INTRAVENOUS | Status: DC | PRN

## 2023-03-19 MED ORDER — PROPOFOL 10 MG/ML IV BOLUS
INTRAVENOUS | Status: DC | PRN
  Administered 2023-03-19: 150 mg via INTRAVENOUS

## 2023-03-19 MED ORDER — ROCURONIUM BROMIDE 100 MG/10ML IV SOLN
INTRAVENOUS | Status: DC | PRN
  Administered 2023-03-19: 60 mg via INTRAVENOUS

## 2023-03-19 MED ORDER — ONDANSETRON HCL 4 MG/2ML IJ SOLN: 4.0000 mg | Freq: Once | INTRAMUSCULAR | Status: DC | PRN

## 2023-03-19 MED ORDER — ONDANSETRON HCL 4 MG/2ML IJ SOLN
INTRAMUSCULAR | Status: DC | PRN
  Administered 2023-03-19: 4 mg via INTRAVENOUS

## 2023-03-19 MED ORDER — 0.9 % SODIUM CHLORIDE (POUR BTL) OPTIME
TOPICAL | Status: DC | PRN
  Administered 2023-03-19: 1000 mL

## 2023-03-19 MED ORDER — LIDOCAINE 2% (20 MG/ML) 5 ML SYRINGE
INTRAMUSCULAR | Status: DC | PRN
  Administered 2023-03-19: 1.5 mg/kg/h via INTRAVENOUS

## 2023-03-19 MED ORDER — OXYCODONE HCL 5 MG PO TABS: 5.0000 mg | ORAL_TABLET | Freq: Once | ORAL | Status: DC | PRN

## 2023-03-19 MED ORDER — VANCOMYCIN HCL 1500 MG/300ML IV SOLN
1500.0000 mg | INTRAVENOUS | Status: AC
  Administered 2023-03-19: 1500 mg via INTRAVENOUS
  Filled 2023-03-19: qty 300

## 2023-03-19 MED ORDER — KETAMINE HCL 50 MG/5ML IJ SOSY
PREFILLED_SYRINGE | INTRAMUSCULAR | Status: AC
  Filled 2023-03-19: qty 5

## 2023-03-19 MED ORDER — FENTANYL CITRATE (PF) 100 MCG/2ML IJ SOLN
INTRAMUSCULAR | Status: AC
  Filled 2023-03-19: qty 2

## 2023-03-19 MED ORDER — ORAL CARE MOUTH RINSE: 15.0000 mL | Freq: Once | OROMUCOSAL | Status: AC

## 2023-03-19 MED ORDER — TRAMADOL HCL 50 MG PO TABS: 50.0000 mg | ORAL_TABLET | Freq: Four times a day (QID) | ORAL | 0 refills | Status: DC | PRN

## 2023-03-19 MED ORDER — SUGAMMADEX SODIUM 200 MG/2ML IV SOLN
INTRAVENOUS | Status: DC | PRN
  Administered 2023-03-19: 200 mg via INTRAVENOUS

## 2023-03-19 MED ORDER — DEXAMETHASONE SODIUM PHOSPHATE 10 MG/ML IJ SOLN
INTRAMUSCULAR | Status: DC | PRN
  Administered 2023-03-19: 10 mg via INTRAVENOUS

## 2023-03-19 MED ORDER — MIDAZOLAM HCL 2 MG/2ML IJ SOLN
INTRAMUSCULAR | Status: AC
  Filled 2023-03-19: qty 2

## 2023-03-19 MED ORDER — CHLORHEXIDINE GLUCONATE 0.12 % MT SOLN
15.0000 mL | Freq: Once | OROMUCOSAL | Status: AC
  Administered 2023-03-19: 15 mL via OROMUCOSAL

## 2023-03-19 MED ORDER — PROPOFOL 10 MG/ML IV BOLUS
INTRAVENOUS | Status: AC
  Filled 2023-03-19: qty 20

## 2023-03-19 MED ORDER — PHENYLEPHRINE HCL (PRESSORS) 10 MG/ML IV SOLN
INTRAVENOUS | Status: DC | PRN
  Administered 2023-03-19: 240 ug via INTRAVENOUS
  Administered 2023-03-19 (×2): 160 ug via INTRAVENOUS
  Administered 2023-03-19 (×2): 80 ug via INTRAVENOUS

## 2023-03-19 MED ORDER — FENTANYL CITRATE (PF) 100 MCG/2ML IJ SOLN
INTRAMUSCULAR | Status: DC | PRN
  Administered 2023-03-19: 100 ug via INTRAVENOUS

## 2023-03-19 SURGICAL SUPPLY — 39 items
APL PRP STRL LF DISP 70% ISPRP (MISCELLANEOUS) ×1
BAG COUNTER SPONGE SURGICOUNT (BAG) IMPLANT
BAG SPNG CNTER NS LX DISP (BAG)
BLADE HEX COATED 2.75 (ELECTRODE) ×1 IMPLANT
BLADE SURG SZ10 CARB STEEL (BLADE) ×1 IMPLANT
CHLORAPREP W/TINT 26 (MISCELLANEOUS) ×1 IMPLANT
COVER SURGICAL LIGHT HANDLE (MISCELLANEOUS) ×1 IMPLANT
DRAIN CHANNEL RND F F (WOUND CARE) IMPLANT
DRAPE LAPAROTOMY T 102X78X121 (DRAPES) IMPLANT
DRAPE LAPAROTOMY TRNSV 102X78 (DRAPES) IMPLANT
DRAPE SHEET LG 3/4 BI-LAMINATE (DRAPES) IMPLANT
DRESSING PEEL AND PLAC PRVNA20 (GAUZE/BANDAGES/DRESSINGS) IMPLANT
DRSG PEEL AND PLACE PREVENA 20 (GAUZE/BANDAGES/DRESSINGS)
DRSG VAC GRANUFOAM MED (GAUZE/BANDAGES/DRESSINGS) IMPLANT
ELECT REM PT RETURN 15FT ADLT (MISCELLANEOUS) ×1 IMPLANT
EVACUATOR SILICONE 100CC (DRAIN) IMPLANT
GAUZE SPONGE 4X4 12PLY STRL (GAUZE/BANDAGES/DRESSINGS) ×1 IMPLANT
GLOVE BIOGEL PI IND STRL 7.0 (GLOVE) ×1 IMPLANT
GLOVE SURG ORTHO 8.0 STRL STRW (GLOVE) ×1 IMPLANT
GLOVE SURG SYN 7.5 E (GLOVE) ×1 IMPLANT
GLOVE SURG SYN 7.5 PF PI (GLOVE) ×1 IMPLANT
GOWN STRL REUS W/ TWL XL LVL3 (GOWN DISPOSABLE) ×2 IMPLANT
GOWN STRL REUS W/TWL XL LVL3 (GOWN DISPOSABLE) ×2
KIT BASIN OR (CUSTOM PROCEDURE TRAY) ×1 IMPLANT
KIT DRSG PREVENA PLUS 7DAY 125 (MISCELLANEOUS) IMPLANT
KIT TURNOVER KIT A (KITS) IMPLANT
MARKER SKIN DUAL TIP RULER LAB (MISCELLANEOUS) IMPLANT
NDL HYPO 25X1 1.5 SAFETY (NEEDLE) ×1 IMPLANT
NEEDLE HYPO 25X1 1.5 SAFETY (NEEDLE) ×1 IMPLANT
NS IRRIG 1000ML POUR BTL (IV SOLUTION) ×1 IMPLANT
PACK GENERAL/GYN (CUSTOM PROCEDURE TRAY) ×1 IMPLANT
SPIKE FLUID TRANSFER (MISCELLANEOUS) IMPLANT
STAPLER SKIN PROX WIDE 3.9 (STAPLE) IMPLANT
STRIP CLOSURE SKIN 1/2X4 (GAUZE/BANDAGES/DRESSINGS) IMPLANT
SUT ETHILON 3 0 PS 1 (SUTURE) IMPLANT
SUT MNCRL AB 4-0 PS2 18 (SUTURE) IMPLANT
SUT VIC AB 3-0 SH 18 (SUTURE) IMPLANT
SYR CONTROL 10ML LL (SYRINGE) ×1 IMPLANT
TOWEL OR 17X26 10 PK STRL BLUE (TOWEL DISPOSABLE) ×1 IMPLANT

## 2023-03-19 NOTE — Anesthesia Procedure Notes (Signed)
Procedure Name: Intubation Date/Time: 03/19/2023 7:32 AM  Performed by: Chinita Pester, CRNAPre-anesthesia Checklist: Patient identified, Emergency Drugs available, Suction available and Patient being monitored Patient Re-evaluated:Patient Re-evaluated prior to induction Oxygen Delivery Method: Circle System Utilized Preoxygenation: Pre-oxygenation with 100% oxygen Induction Type: IV induction Ventilation: Mask ventilation without difficulty Laryngoscope Size: Mac and 3 Grade View: Grade I Tube type: Oral Tube size: 7.0 mm Number of attempts: 1 Airway Equipment and Method: Stylet and Oral airway Placement Confirmation: ETT inserted through vocal cords under direct vision, positive ETCO2 and breath sounds checked- equal and bilateral Secured at: 22 cm Tube secured with: Tape Dental Injury: Teeth and Oropharynx as per pre-operative assessment  Difficulty Due To: Difficulty was anticipated

## 2023-03-19 NOTE — Discharge Instructions (Signed)
  [facility] -- DISCHARGE INSTRUCTIONS  REMINDER:   Carry a list of your medications and allergies with you at all times  Call your pharmacy at least 1 week in advance to refill prescriptions  Do not mix any prescribed pain medicine with alcohol  Do not drive any motor vehicles while taking pain medication  Take medications with food unless otherwise directed  Follow-up appointments (date to return to physician): Please call [phone] to confirm your follow up appointment with your surgeon.  Call your Surgeon if you have:  Temperature greater than 101.0  Persistent nausea and vomiting  Severe uncontrolled pain  Redness, tenderness, or signs of infection (pain, swelling, redness, odor or green/yellow discharge around the site)  Difficulty breathing, headache or visual disturbances  Hives  Persistent dizziness or light-headedness  Any other questions or concerns you may have after discharge  In an emergency, call 911 or go to an Emergency Department at a nearby hospital.  Diet: Begin with liquids, and if they are tolerated, resume your usual diet.  Avoid spicy, greasy or heavy foods.  If you have nausea or vomiting, go back to liquids.  If you cannot keep liquids down, call your doctor.  Avoid alcohol consumption while on prescription pain medications. Good nutrition promotes healing. Increase fiber and fluids.   ADDITIONAL INSTRUCTIONS: Vacuum dressing should stay connected to pump at all times.  Home health nurse will be arranged.  [facility] Office: [phone]

## 2023-03-19 NOTE — Op Note (Signed)
Operative Note  Pre-operative Diagnosis: Nonhealing surgical wound of the left upper back  Post-operative Diagnosis: Same  Surgeon:  [name], MD  Assistant: None   Procedure: Wound exploration, debridement, and placement of a closed vacuum system dressing (18 cm x 15 cm)  Anesthesia:  general  Estimated Blood Loss:  minimal  Drains: closed vacuum dressing with Provena pump         Specimen: aerobic and anaerobic cultures to lab  Indications: Patient is a 67 year old female who underwent resection of a large soft tissue mass from the left upper back 1 month ago.  Wound was closed primarily with a subcutaneous drain in place.  Wound has failed to heal with persistent drainage.  Patient now comes to surgery for wound exploration and debridement.  Procedure:  The patient was seen in the pre-op holding area. The risks, benefits, complications, treatment options, and expected outcomes were previously discussed with the patient. The patient agreed with the proposed plan and has signed the informed consent form.  The patient was brought to the operating room by the surgical team, identified as Shannon Navarro and the procedure verified. A "time out" was completed and the above information confirmed.  Following administration of general anesthesia, the patient is turned to a right lateral decubitus position.  The previously placed drain in the left upper back is completely removed.  The site is then prepped and draped in usual aseptic fashion.  After ascertaining that an adequate level of anesthesia been achieved, the inferior aspect of the surgical wound is opened with the electrocautery.  Dissection is carried through subcutaneous tissues into what is essentially a large seroma cavity.  There is a small amount of fibrinous debris.  Aerobic and anaerobic cultures are obtained and sent to the laboratory.  Cavity is then irrigated copiously with warm saline and fibrinous debris is removed.  The  walls of the cavity appeared to have good granulation tissue throughout.  There is no necrosis.  Decision is made to place a vacuum dressing.  The black sponges are cut to the appropriate dimensions and placed within the seroma cavity.  Additional sponges are placed in the subcutaneous space.  The occlusive adhesive dressing is then placed over the wound and the it is placed to vacuum using a Prevena pump.  There appears to be a good seal.  Skin is cleaned outside of the dressing and dried.  Patient is turned back to a supine position and awakened from anesthesia.  Patient is transported to the recovery room in stable condition.   [name], MD [facility] Office: [phone]

## 2023-03-19 NOTE — Anesthesia Preprocedure Evaluation (Addendum)
Anesthesia Evaluation  Patient identified by MRN, date of birth, ID band Patient awake    Reviewed: Allergy & Precautions, H&P , NPO status , Patient's Chart, lab work & pertinent test results, reviewed documented beta blocker date and time   History of Anesthesia Complications (+) PONV and history of anesthetic complications (only once in 2023, did well w/ last surgery)  Airway Mallampati: III  TM Distance: >3 FB Neck ROM: Full    Dental  (+) Dental Advisory Given, Teeth Intact   Pulmonary sleep apnea and Continuous Positive Airway Pressure Ventilation    Pulmonary exam normal breath sounds clear to auscultation       Cardiovascular hypertension (142/61 preop), Pt. on medications and Pt. on home beta blockers Normal cardiovascular exam+ dysrhythmias Atrial Fibrillation  Rhythm:Regular Rate:Normal     Neuro/Psych  Headaches PSYCHIATRIC DISORDERS Anxiety        GI/Hepatic Neg liver ROS,GERD  Controlled and Medicated,,  Endo/Other  negative endocrine ROS    Renal/GU negative Renal ROS  negative genitourinary   Musculoskeletal negative musculoskeletal ROS (+)    Abdominal  (+) + obese  Peds negative pediatric ROS (+)  Hematology negative hematology ROS (+)   Anesthesia Other Findings   Reproductive/Obstetrics negative OB ROS                             Anesthesia Physical Anesthesia Plan  ASA: 3  Anesthesia Plan: General   Post-op Pain Management: Tylenol PO (pre-op)*   Induction: Intravenous  PONV Risk Score and Plan: 4 or greater and Ondansetron, Dexamethasone, Midazolam and Treatment may vary due to age or medical condition  Airway Management Planned: Oral ETT and Video Laryngoscope Planned  Additional Equipment: None  Intra-op Plan:   Post-operative Plan: Extubation in OR  Informed Consent: I have reviewed the patients History and Physical, chart, labs and discussed the  procedure including the risks, benefits and alternatives for the proposed anesthesia with the patient or authorized representative who has indicated his/her understanding and acceptance.     Dental advisory given  Plan Discussed with: CRNA  Anesthesia Plan Comments: (Last airway note: Ventilation: Mask ventilation without difficulty and Oral airway inserted - appropriate to patient size Laryngoscope Size: Mac and 3 Grade View: Grade II Tube type: Oral Tube size: 7.0 mm Number of attempts: 1 Airway Equipment and Method: Stylet, Oral airway and Video-laryngoscopy )       Anesthesia Quick Evaluation

## 2023-03-19 NOTE — Transfer of Care (Signed)
Immediate Anesthesia Transfer of Care Note  Patient: Shannon Navarro  Procedure(s) Performed: WOUND EXPLORATION AND DRAIN PLACEMENT  Patient Location: PACU  Anesthesia Type:General  Level of Consciousness: awake, alert , drowsy, and patient cooperative  Airway & Oxygen Therapy: Patient Spontanous Breathing and Patient connected to face mask oxygen  Post-op Assessment: Report given to RN and Post -op Vital signs reviewed and stable  Post vital signs: Reviewed and stable  Last Vitals:  Vitals Value Taken Time  BP 115/73 03/19/23 0830  Temp 36.5 C 03/19/23 0826  Pulse 66 03/19/23 0832  Resp 15 03/19/23 0832  SpO2 100 % 03/19/23 0832  Vitals shown include unfiled device data.  Last Pain:  Vitals:   03/19/23 1610  TempSrc:   PainSc: 0-No pain         Complications:  Encounter Notable Events  Notable Event Outcome Phase Comment  Difficult to intubate - expected  Intraprocedure Filed from anesthesia note documentation.

## 2023-03-19 NOTE — Anesthesia Postprocedure Evaluation (Signed)
Anesthesia Post Note  Patient: Shannon Navarro  Procedure(s) Performed: WOUND EXPLORATION AND DRAIN PLACEMENT     Patient location during evaluation: PACU Anesthesia Type: General Level of consciousness: awake and alert, oriented and patient cooperative Pain management: pain level controlled Vital Signs Assessment: post-procedure vital signs reviewed and stable Respiratory status: spontaneous breathing, nonlabored ventilation and respiratory function stable Cardiovascular status: blood pressure returned to baseline and stable Postop Assessment: no apparent nausea or vomiting Anesthetic complications: yes   Encounter Notable Events  Notable Event Outcome Phase Comment  Difficult to intubate - expected  Intraprocedure Filed from anesthesia note documentation.    Last Vitals:  Vitals:   03/19/23 0830 03/19/23 0845  BP: 115/73 118/61  Pulse: (!) 56 60  Resp: 18 16  Temp:    SpO2: 100% 97%    Last Pain:  Vitals:   03/19/23 0845  TempSrc:   PainSc: 0-No pain                 [name]

## 2023-03-19 NOTE — Interval H&P Note (Signed)
History and Physical Interval Note:  03/19/2023 7:00 AM  Shannon Navarro  has presented today for surgery, with the diagnosis of WOUND UPPER BACK.  The various methods of treatment have been discussed with the patient and family. After consideration of risks, benefits and other options for treatment, the patient has consented to    Procedure(s): WOUND EXPLORATION AND DRAIN PLACEMENT (N/A) as a surgical intervention.    The patient's history has been reviewed, patient examined, no change in status, stable for surgery.  I have reviewed the patient's chart and labs.  Questions were answered to the patient's satisfaction.    [name], MD [facility] practice Office: [phone]   [name]

## 2023-03-20 ENCOUNTER — Encounter (HOSPITAL_COMMUNITY): Payer: Self-pay | Admitting: Surgery

## 2023-03-21 ENCOUNTER — Encounter (HOSPITAL_COMMUNITY): Payer: Self-pay

## 2023-03-21 ENCOUNTER — Emergency Department (HOSPITAL_COMMUNITY)
Admission: EM | Admit: 2023-03-21 | Discharge: 2023-03-21 | Disposition: A | Discharge status: Home / Self Care | Payer: Medicare Other | Attending: Emergency Medicine | Admitting: Emergency Medicine

## 2023-03-21 ENCOUNTER — Other Ambulatory Visit: Payer: Self-pay

## 2023-03-21 DIAGNOSIS — Z4801 Encounter for change or removal of surgical wound dressing: Secondary | ICD-10-CM | POA: Diagnosis present

## 2023-03-21 DIAGNOSIS — Z4689 Encounter for fitting and adjustment of other specified devices: Secondary | ICD-10-CM

## 2023-03-21 NOTE — Discharge Instructions (Signed)
Return to the emergency department if you experience any issues with her wound VAC.  Otherwise follow-up with your general surgery team on Monday.

## 2023-03-21 NOTE — ED Provider Notes (Signed)
[place] EMERGENCY DEPARTMENT AT [facility] Provider Note   CSN: [id number] Arrival date & time: 03/21/23  0109     History {Add pertinent medical, surgical, social history, OB history to HPI:1} Chief Complaint  Patient presents with   wound vac change     Shannon Navarro is a 67 y.o. female.  67 yo F with hx of lipoma removal on October 8th with debreidment on Nov 7th with wound vac placement who presents emergency department with wound VAC problem.  Last night around 1:30 AM her wound VAC started alarming that there was a blockage.  Says that she called the hotline at [phone] and they told her that the unit was defective.  Before this there were no changes to the output and she reports that her wound has been healing well.       Home Medications Prior to Admission medications   Medication Sig Start Date End Date Taking? Authorizing Provider  amLODipine (NORVASC) 2.5 MG tablet Take 1 tablet (2.5 mg total) by mouth daily. 04/21/22   Myrlene Broker, MD  Ascorbic Acid (VITAMIN C PO) Take 1 tablet by mouth daily.    [provider]  cephALEXin (KEFLEX) 500 MG capsule Take 1 capsule (500 mg total) by mouth 4 (four) times daily. 03/14/23   Benjiman Core, MD  fluticasone (FLONASE) 50 MCG/ACT nasal spray Place 2 sprays into both nostrils daily. Patient taking differently: Place 2 sprays into both nostrils daily as needed for allergies. 02/18/21   Myrlene Broker, MD  loratadine (CLARITIN) 10 MG tablet Take 10 mg by mouth in the morning.    [provider]  metoprolol tartrate (LOPRESSOR) 25 MG tablet Take 0.5 tablets (12.5 mg total) by mouth daily. Patient taking differently: Take 12.5 mg by mouth daily as needed (palpitation/afib). 12/29/22   Rollene Rotunda, MD  omeprazole (PRILOSEC OTC) 20 MG tablet Take 20 mg by mouth daily before breakfast.    [provider]  traMADol (ULTRAM) 50 MG tablet Take 1 tablet (50 mg total) by  mouth every 6 (six) hours as needed. 03/19/23   [name], MD  VITAMIN D PO Take 1 tablet by mouth in the morning. Gummy    [provider]      Allergies    Cefaclor, Cipro [ciprofloxacin hcl], Codeine, Hydrocodone, Levaquin [levofloxacin], Nexium [esomeprazole magnesium], Penicillins, and Sulfa antibiotics    Review of Systems   Review of Systems  Physical Exam Updated Vital Signs BP (!) 128/53   Pulse (!) 42   Temp 98.1 F (36.7 C) (Oral)   Resp 18   SpO2 99%  Physical Exam Constitutional:      Appearance: Normal appearance.     Comments: Prevena plus wound VAC with serosanguineous drainage (100 mL).  Does have alarm stating that it is not draining.  Tubing appears to be intact without any obvious kinks or clogs.  Wound on back appears clean dry and intact and healing well.  Neurological:     Mental Status: She is alert.     ED Results / Procedures / Treatments   Labs (all labs ordered are listed, but only abnormal results are displayed) Labs Reviewed - No data to display  EKG None  Radiology No results found.  Procedures Procedures  {Document cardiac monitor, telemetry assessment procedure when appropriate:1}  Medications Ordered in ED Medications - No data to display  ED Course/ Medical Decision Making/ A&P   {   Click here for  ABCD2, HEART and other calculatorsREFRESH Note before signing :1}                              Medical Decision Making  ***  {Document critical care time when appropriate:1} {Document review of labs and clinical decision tools ie heart score, Chads2Vasc2 etc:1}  {Document your independent review of radiology images, and any outside records:1} {Document your discussion with family members, caretakers, and with consultants:1} {Document social determinants of health affecting pt's care:1} {Document your decision making why or why not admission, treatments were needed:1} Final Clinical Impression(s) / ED Diagnoses Final  diagnoses:  None    Rx / DC Orders ED Discharge Orders     None

## 2023-03-21 NOTE — ED Triage Notes (Signed)
Pt states that her wound vac is not working and she was told to come here.

## 2023-03-21 NOTE — Care Management (Signed)
Patient has a proveena from surgery on 11/7 that is not functioning. Instructed staff to call OR for new pump . Follow previous instructions for follow up with MD ( usually 1 week)

## 2023-03-22 ENCOUNTER — Telehealth: Payer: Self-pay

## 2023-03-22 NOTE — Progress Notes (Signed)
With a VAC dressing in place, the finding of rare staph does not require antibiotic treatment.  [name], MD [facility] practice Office: [phone]

## 2023-03-22 NOTE — Telephone Encounter (Signed)
Patient called and stated that her proveena vac container is almost full she is wondering about a HH nurse. This was apparently ordered in OR PACU on Thursday. NO documentation of HH. Called [facility] and they accepted for RN. They will call patient tonight and  on call surgery to see what can be done. Attempted to obtain a cannister to exchange, however OR cannot release without patient being in hospital,, [facility] was called and told to call OR initially, they do not have them available. Becky Sax from [facility] will call on call MD for direction and call the patient, see the patient tomorrow.

## 2023-03-24 LAB — AEROBIC/ANAEROBIC CULTURE W GRAM STAIN (SURGICAL/DEEP WOUND): Gram Stain: NONE SEEN

## 2023-03-25 ENCOUNTER — Telehealth: Payer: Self-pay

## 2023-03-25 ENCOUNTER — Ambulatory Visit: Payer: Medicare Other | Attending: Cardiology | Admitting: Cardiology

## 2023-03-25 ENCOUNTER — Telehealth: Payer: Self-pay | Admitting: Cardiology

## 2023-03-25 ENCOUNTER — Encounter: Payer: Self-pay | Admitting: Cardiology

## 2023-03-25 VITALS — BP 128/74 | HR 55 | Ht 66.0 in | Wt 228.8 lb

## 2023-03-25 DIAGNOSIS — G4733 Obstructive sleep apnea (adult) (pediatric): Secondary | ICD-10-CM | POA: Diagnosis present

## 2023-03-25 DIAGNOSIS — R0789 Other chest pain: Secondary | ICD-10-CM | POA: Insufficient documentation

## 2023-03-25 DIAGNOSIS — I10 Essential (primary) hypertension: Secondary | ICD-10-CM | POA: Insufficient documentation

## 2023-03-25 DIAGNOSIS — I700 Atherosclerosis of aorta: Secondary | ICD-10-CM | POA: Diagnosis present

## 2023-03-25 DIAGNOSIS — I480 Paroxysmal atrial fibrillation: Secondary | ICD-10-CM | POA: Diagnosis present

## 2023-03-25 DIAGNOSIS — R001 Bradycardia, unspecified: Secondary | ICD-10-CM | POA: Insufficient documentation

## 2023-03-25 DIAGNOSIS — R600 Localized edema: Secondary | ICD-10-CM | POA: Insufficient documentation

## 2023-03-25 NOTE — Telephone Encounter (Signed)
Patient is asking for a return phone back in regarding her surgery in October and some complications she 's been having

## 2023-03-25 NOTE — Telephone Encounter (Signed)
Called patient to inquire on leg Swelling location - top of foot and below the knee. Redness - No Pain - just starting states hat the knee swelling in painful but not severe Patient will try to come in earlier Patient stopped taking any form of anti coag  at this point looks like it was stopped back on 03/17/23

## 2023-03-25 NOTE — Patient Instructions (Signed)
Medication Instructions:  No changes *If you need a refill on your cardiac medications before your next appointment, please call your pharmacy*  Lab Work: No labs  Testing/Procedures: No testing  Follow-Up: At [facility], you and your health needs are our priority.  As part of our continuing mission to provide you with exceptional heart care, we have created designated Provider Care Teams.  These Care Teams include your primary Cardiologist (physician) and Advanced Practice Providers (APPs -  Physician Assistants and Nurse Practitioners) who all work together to provide you with the care you need, when you need it.  We recommend signing up for the patient portal called "MyChart".  Sign up information is provided on this After Visit Summary.  MyChart is used to connect with patients for Virtual Visits (Telemedicine).  Patients are able to view lab/test results, encounter notes, upcoming appointments, etc.  Non-urgent messages can be sent to your provider as well.   To learn more about what you can do with MyChart, go to [email or web address].    Your next appointment:   7 week(s)  Provider:   Reather Littler, NP

## 2023-03-25 NOTE — Telephone Encounter (Signed)
Patient identification verified by 2 forms. Marilynn Rail, RN   Called and spoke to patient  Patient states:   -Recently had lipoma removed on 10/8  -since removal she's had complication/infection   -Yesterday she was told she has a staph/MRSA infection in wound?   -For past 3 weeks she's had muscle twitch/discomfort in area above heart   -it does not feel like chest pain   -discomfort comes and goes   -feels like something is wrong   -has swelling in right leg, which developed after procedure   -concerned complications could be affecting heart  Patient scheduled for OV 11/13 at 3pm  Patient agrees with appointment, no questions at this time

## 2023-03-25 NOTE — Progress Notes (Addendum)
Cardiology Office Note    Date:  03/25/2023  ID:  Shannon Navarro, DOB 10-04-1955, MRN [id number] PCP:  Myrlene Broker, MD  Cardiologist:  Rollene Rotunda, MD  Electrophysiologist:  None   Chief Complaint: Chest discomfort, lower extremity edema   History of Present Illness: .    Shannon Navarro is a 67 y.o. female with visit-pertinent history of paroxysmal atrial fibrillation, hypertension, bradycardia, aortic atherosclerosis with negative stress test in 2017, OSA on CPAP, hiatal hernia, GERD, Schatzki's ring, cholecystitis s/p cholecystectomy in 03/2022, ERCP with stone extraction in 04/2022 and diverticulosis.  She has a history of cardiomegaly as well as aortic atherosclerosis noted on prior chest x-ray.  In 2017 she had a ETT which showed no evidence of ischemia.  In 02/2016 she presented to the ED in setting of new onset atrial fibrillation.  She was started on Xarelto.  This was eventually discontinued in the setting of no recurrent atrial fibrillation.  She was evaluated in the ED in 03/2022 in the setting of dizziness.  Her cardiac enzymes and EKG were unremarkable.  On arrival she was bradycardic and hypotensive.  It was thought that she possibly had a vasovagal episode related to biliary cholelithiasis.  She wore a 3-day Zio patch in 03/2022 which showed normal sinus rhythm, infrequent runs of SVT, longest run lasting 13 beats, no sustained arrhythmias.  Echocardiogram at time showed an EF of 60 to 65%, normal LV function, no RWMA, indeterminate diastolic parameters, normal RV systolic function, no significant valvular abnormalities.  In May 2024 she was seen in the ED for atrial fibrillation, this resolved after an episode of IV metoprolol.  At office visit on 12/26/2022 she reported other episodes of tachypalpitations and elevations in blood pressure when on prednisone.  She had a documented episode of atrial fibrillation she was started on Eliquis 5 mg twice daily and started  on metoprolol 12.5 mg as needed for palpitations.  On 02/17/2023 she underwent excision of lipoma on her left back, her postoperative course was complicated by hematoma formation, and development of rash and infection.  She was started on prednisone 20 mg daily for a week at the end of October.  Unfortunately it was felt that she likely had a organized hematoma, on 03/19/2023 she underwent wound exploration, debridement and placement of closed vacuum system.  With increased wound drainage and bleeding her Xarelto has been held by her Careers adviser. She has been on antibiotic treatment.  Today she presents reporting a fleeting sensation of tightening or quickening in her epigastric/chest.  She reports that she started having the discomfort 3 weeks ago after starting on prednisone and has continued.  She notes that it will occur 1-2 times a week and lasts only a few seconds, not associated with exertion, denies associated symptoms.  She reports this is not chest pain, denies shortness of breath, or palpitations.  She notes that since her back surgery she has had to lay on her side at night and is unable to move, questions possible musculoskeletal component.  She also endorses intermittent bilateral lower extremity edema that started after taking prednisone, she notes that there has been some improvement.  Worsens as she is on her feet throughout the day and improves overnight.  Labwork independently reviewed: 03/19/2023: Sodium 141, potassium 3.9, creatinine 0.74 ROS: .   Today she denies chest pain, shortness of breath, fatigue, palpitations, melena, hematuria, hemoptysis, diaphoresis, weakness, presyncope, syncope, orthopnea, and PND.  All other systems are reviewed and otherwise  negative. Studies Reviewed: Marland Kitchen    EKG:  EKG is ordered today, personally reviewed, demonstrating  EKG Interpretation Date/Time:  Wednesday March 25 2023 15:08:08 EST Ventricular Rate:  52 PR Interval:  134 QRS Duration:  80 QT  Interval:  448 QTC Calculation: 416 R Axis:   2  Text Interpretation: Sinus bradycardia When compared with ECG of 26-Dec-2022 12:13, No significant change was found Confirmed by Reather Littler [phone]) on 03/25/2023 3:09:34 PM   CV Studies:  Cardiac Studies & Procedures     STRESS TESTS  EXERCISE TOLERANCE TEST (ETT) 06/13/2015  Narrative  There was no ST segment deviation noted during stress.  Nl GXT   ECHOCARDIOGRAM  ECHOCARDIOGRAM COMPLETE 04/09/2022  Narrative ECHOCARDIOGRAM REPORT    Patient Name:   Shannon Navarro Date of Exam: 04/09/2022 Medical Rec #:  [id number]         Height:       66.0 in Accession #:    [id number]        Weight:       226.8 lb Date of Birth:  August 09, 1955         BSA:          2.110 m Patient Age:    66 years          BP:           122/39 mmHg Patient Gender: F                 HR:           46 bpm. Exam Location:  Inpatient  Procedure: 2D Echo, Color Doppler and Cardiac Doppler  Indications:    Pre-op clearance  History:        Patient has prior history of Echocardiogram examinations, most recent 09/08/2019. Arrythmias:Atrial Fibrillation and Bradycardia; Risk Factors:Sleep Apnea and Hypertension.  Sonographer:    Milda Smart Referring Phys: [phone] HAO MENG   Sonographer Comments: Suboptimal parasternal window. Image acquisition challenging due to patient body habitus. IMPRESSIONS   1. Left ventricular ejection fraction, by estimation, is 60 to 65%. The left ventricle has normal function. The left ventricle has no regional wall motion abnormalities. Left ventricular diastolic parameters are indeterminate. 2. Right ventricular systolic function is normal. The right ventricular size is normal. There is normal pulmonary artery systolic pressure. 3. No evidence of mitral valve regurgitation. 4. Aortic valve regurgitation is not visualized. 5. The inferior vena cava is normal in size with greater than 50% respiratory variability, suggesting  right atrial pressure of 3 mmHg.  Comparison(s): No significant change from prior study.  FINDINGS Left Ventricle: Left ventricular ejection fraction, by estimation, is 60 to 65%. The left ventricle has normal function. The left ventricle has no regional wall motion abnormalities. The left ventricular internal cavity size was normal in size. Left ventricular diastolic parameters are indeterminate.  Right Ventricle: The right ventricular size is normal. Right ventricular systolic function is normal. There is normal pulmonary artery systolic pressure. The tricuspid regurgitant velocity is 2.38 m/s, and with an assumed right atrial pressure of 3 mmHg, the estimated right ventricular systolic pressure is 25.7 mmHg.  Left Atrium: Left atrial size was normal in size.  Right Atrium: Right atrial size was normal in size.  Pericardium: There is no evidence of pericardial effusion.  Mitral Valve: No evidence of mitral valve regurgitation. MV peak gradient, 4.2 mmHg. The mean mitral valve gradient is 1.0 mmHg.  Tricuspid Valve: Tricuspid valve regurgitation is trivial.  Aortic Valve: Aortic  valve regurgitation is not visualized. Aortic valve mean gradient measures 9.0 mmHg. Aortic valve peak gradient measures 15.2 mmHg. Aortic valve area, by VTI measures 2.59 cm.  Pulmonic Valve: Pulmonic valve regurgitation is trivial.  Aorta: The aortic root and ascending aorta are structurally normal, with no evidence of dilitation.  Venous: The inferior vena cava is normal in size with greater than 50% respiratory variability, suggesting right atrial pressure of 3 mmHg.  IAS/Shunts: No atrial level shunt detected by color flow Doppler.   LEFT VENTRICLE PLAX 2D LVIDd:         4.90 cm   Diastology LVIDs:         3.20 cm   LV e' medial:    5.44 cm/s LV PW:         0.90 cm   LV E/e' medial:  18.4 LV IVS:        0.60 cm   LV e' lateral:   8.81 cm/s LVOT diam:     2.00 cm   LV E/e' lateral: 11.3 LV SV:          129 LV SV Index:   61 LVOT Area:     3.14 cm   RIGHT VENTRICLE             IVC RV S prime:     12.70 cm/s  IVC diam: 2.20 cm TAPSE (M-mode): 2.7 cm  LEFT ATRIUM             Index        RIGHT ATRIUM           Index LA diam:        3.30 cm 1.56 cm/m   RA Area:     16.10 cm LA Vol (A2C):   51.1 ml 24.22 ml/m  RA Volume:   36.80 ml  17.44 ml/m LA Vol (A4C):   44.8 ml 21.24 ml/m LA Biplane Vol: 47.7 ml 22.61 ml/m AORTIC VALVE                     PULMONIC VALVE AV Area (Vmax):    2.59 cm      PR End Diast Vel: 1.84 msec AV Area (Vmean):   2.46 cm AV Area (VTI):     2.59 cm AV Vmax:           195.00 cm/s AV Vmean:          139.000 cm/s AV VTI:            0.500 m AV Peak Grad:      15.2 mmHg AV Mean Grad:      9.0 mmHg LVOT Vmax:         161.00 cm/s LVOT Vmean:        109.000 cm/s LVOT VTI:          0.412 m LVOT/AV VTI ratio: 0.82  AORTA Ao Root diam: 2.60 cm Ao Asc diam:  3.40 cm  MITRAL VALVE               TRICUSPID VALVE MV Area (PHT): 3.72 cm    TR Peak grad:   22.7 mmHg MV Area VTI:   2.76 cm    TR Mean grad:   16.0 mmHg MV Peak grad:  4.2 mmHg    TR Vmax:        238.00 cm/s MV Mean grad:  1.0 mmHg    TR Vmean:       194.0 cm/s MV Vmax:  1.02 m/s MV Vmean:      48.6 cm/s   SHUNTS MV Decel Time: 204 msec    Systemic VTI:  0.41 m MV E velocity: 99.90 cm/s  Systemic Diam: 2.00 cm MV A velocity: 81.00 cm/s MV E/A ratio:  1.23  Photographer signed by Carolan Clines Signature Date/Time: 04/09/2022/2:05:58 PM    Final    MONITORS  LONG TERM MONITOR (3-14 DAYS) 04/09/2022  Narrative Normal sinus rhythm Infrequent runs of SVT with the longest rn being 13 beats No sustained arrhythmias.           Current Reported Medications:.    Current Meds  Medication Sig   amLODipine (NORVASC) 2.5 MG tablet Take 1 tablet (2.5 mg total) by mouth daily.   Ascorbic Acid (VITAMIN C PO) Take 1 tablet by mouth daily.   cephALEXin (KEFLEX) 500 MG  capsule Take 1 capsule (500 mg total) by mouth 4 (four) times daily.   fluticasone (FLONASE) 50 MCG/ACT nasal spray Place 2 sprays into both nostrils daily. (Patient taking differently: Place 2 sprays into both nostrils daily as needed for allergies.)   loratadine (CLARITIN) 10 MG tablet Take 10 mg by mouth in the morning.   metoprolol tartrate (LOPRESSOR) 25 MG tablet Take 0.5 tablets (12.5 mg total) by mouth daily. (Patient taking differently: Take 12.5 mg by mouth daily as needed (palpitation/afib).)   omeprazole (PRILOSEC OTC) 20 MG tablet Take 20 mg by mouth daily before breakfast.   VITAMIN D PO Take 1 tablet by mouth in the morning. Gummy   Physical Exam:    VS:  BP 128/74   Pulse (!) 55   Ht 5\' 6"  (1.676 m)   Wt 228 lb 12.8 oz (103.8 kg)   SpO2 99%   BMI 36.93 kg/m    Wt Readings from Last 3 Encounters:  03/25/23 228 lb 12.8 oz (103.8 kg)  03/19/23 225 lb (102.1 kg)  03/14/23 225 lb (102.1 kg)    GEN: Well nourished, well developed in no acute distress NECK: No JVD; No carotid bruits CARDIAC: RRR, no murmurs, rubs, gallops RESPIRATORY:  Clear to auscultation without rales, wheezing or rhonchi  ABDOMEN: Soft, non-tender, non-distended EXTREMITIES:  mild bilateral lower extremity edema; No acute deformity   Asessement and Plan:.    Atypical chest discomfort/Aortic atherosclerosis: Stress test in 2017 was negative for ischemia.  Patient presented today reporting an intermittent quickening or tightening sensation in her chest that started 3 weeks ago after starting on prednisone.  Episodes will occur 1-2 times a week, lasting few seconds.  She notes that she is not having chest pain, denies shortness of breath.  She also notes that she has been having to sleep on one side because of her wound VAC and questions if this is potentially musculoskeletal in nature.  Overall symptoms are atypical, she denies any chest pain with exertion, episodes only occur at rest.  We discussed option of  stress testing, patient prefers continue to monitor her symptoms. If she has worsening or ongoing symptoms she will notify the office and can reconsider stress testing at that time. Reviewed ED precautions.   Paroxysmal atrial fibrillation: Noted to have history of PAF.  In May 2024 she was seen in the ED for atrial fibrillation, this resolved after an episode of IV metoprolol. EKG today indicates sinus bradycardia, she denies any recent episodes of atrial fibrillation, notes that she does monitor for this on her Apple Watch.  Patient reports that with her surgery and  ongoing problems with her wound and increased drainage her Xarelto has been held since 03/17/2023.  CHA2DS2-VASc Score = 4 [CHF History: 0, HTN History: 1, Diabetes History: 0, Stroke History: 0, Vascular Disease History: 1, Age Score: 1, Gender Score: 1].  Therefore, the patient's annual risk of stroke is 4.8 %.    Discussed with patient that this does put her at increased risk for stroke, reviewed symptoms of stroke and ED precautions.  Lower extremity edema: Patient reports increased bilateral lower extremity edema since starting on prednisone, she has noted some improvement.  She notes that her swelling is worse at the end of the day and improves overnight.  She denies shortness of breath, orthopnea or PND.  She denies pain or sensation of tightness in legs, denies erythema. Echocardiogram in 03/2022 indicated EF of 60 to 65%, normal LV function, no RWMA, indeterminate diastolic parameters, normal RV systolic function, no significant valvular abnormalities.  Encouraged leg elevation, low-salt diet and compression stockings.  Lipoma removal: On 02/17/2023 she underwent excision of lipoma on her left back, her postoperative course was complicated by hematoma formation, and development of rash and infection.  She underwent debridement and placement of wound VAC on 03/19/2023.  She has been on antibiotic treatment and had a course of prednisone.   She denies any systemic symptoms.  Reviewed ED precautions.  Bradycardia: Heart rate today 52 bpm, she denies increased dizziness or fatigue.  She is not on beta-blocker therapy.  Dizziness/syncope: She denies any dizziness or recurrent syncope.  Hypertension: Blood pressure well controlled today at 128/74, she notes that her blood pressure is consistently less than 135/80.  Continue current antihypertensive regiment.  OSA: Patient reports CPAP compliance.   Disposition: F/u with Reather Littler, NP in six weeks.   Signed, Rip Harbour, NP

## 2023-03-27 ENCOUNTER — Telehealth: Payer: Self-pay | Admitting: Cardiology

## 2023-03-27 NOTE — Telephone Encounter (Signed)
Patient stated she wanted to correct her After Visit Summary to reflect that patient "was not hospitalized in 2017".  Patient stated she only had an ED visit at that time and was not hospitalized.

## 2023-03-29 ENCOUNTER — Encounter (HOSPITAL_COMMUNITY): Payer: Self-pay

## 2023-03-29 ENCOUNTER — Other Ambulatory Visit: Payer: Self-pay

## 2023-03-29 ENCOUNTER — Emergency Department (HOSPITAL_COMMUNITY)
Admission: EM | Admit: 2023-03-29 | Discharge: 2023-03-29 | Disposition: A | Discharge status: Home / Self Care | Payer: Medicare Other | Attending: Emergency Medicine | Admitting: Emergency Medicine

## 2023-03-29 DIAGNOSIS — S21202A Unspecified open wound of left back wall of thorax without penetration into thoracic cavity, initial encounter: Secondary | ICD-10-CM

## 2023-03-29 DIAGNOSIS — X58XXXA Exposure to other specified factors, initial encounter: Secondary | ICD-10-CM | POA: Diagnosis not present

## 2023-03-29 LAB — CBC WITH DIFFERENTIAL/PLATELET
Abs Immature Granulocytes: 0.02 10*3/uL (ref 0.00–0.07)
Basophils Absolute: 0 10*3/uL (ref 0.0–0.1)
Basophils Relative: 1 %
Eosinophils Absolute: 0.1 10*3/uL (ref 0.0–0.5)
Eosinophils Relative: 2 %
HCT: 40.5 % (ref 36.0–46.0)
Hemoglobin: 12.9 g/dL (ref 12.0–15.0)
Immature Granulocytes: 0 %
Lymphocytes Relative: 17 %
Lymphs Abs: 1.3 10*3/uL (ref 0.7–4.0)
MCH: 28.4 pg (ref 26.0–34.0)
MCHC: 31.9 g/dL (ref 30.0–36.0)
MCV: 89 fL (ref 80.0–100.0)
Monocytes Absolute: 0.7 10*3/uL (ref 0.1–1.0)
Monocytes Relative: 9 %
Neutro Abs: 5.3 10*3/uL (ref 1.7–7.7)
Neutrophils Relative %: 71 %
Platelets: 269 10*3/uL (ref 150–400)
RBC: 4.55 MIL/uL (ref 3.87–5.11)
RDW: 12.6 % (ref 11.5–15.5)
WBC: 7.5 10*3/uL (ref 4.0–10.5)
nRBC: 0 % (ref 0.0–0.2)

## 2023-03-29 LAB — COMPREHENSIVE METABOLIC PANEL
ALT: 15 U/L (ref 0–44)
AST: 16 U/L (ref 15–41)
Albumin: 3.3 g/dL — ABNORMAL LOW (ref 3.5–5.0)
Alkaline Phosphatase: 66 U/L (ref 38–126)
Anion gap: 10 (ref 5–15)
BUN: 15 mg/dL (ref 8–23)
CO2: 26 mmol/L (ref 22–32)
Calcium: 9.3 mg/dL (ref 8.9–10.3)
Chloride: 109 mmol/L (ref 98–111)
Creatinine, Ser: 0.7 mg/dL (ref 0.44–1.00)
GFR, Estimated: >60 mL/min (ref >60)
Glucose, Bld: 97 mg/dL (ref 70–99)
Potassium: 3.5 mmol/L (ref 3.5–5.1)
Sodium: 145 mmol/L (ref 135–145)
Total Bilirubin: 1.1 mg/dL (ref <1.2)
Total Protein: 6.2 g/dL — ABNORMAL LOW (ref 6.5–8.1)

## 2023-03-29 MED ORDER — DOXYCYCLINE HYCLATE 100 MG PO CAPS: 100.0000 mg | ORAL_CAPSULE | Freq: Two times a day (BID) | ORAL | 0 refills | Status: DC

## 2023-03-29 MED ORDER — DOXYCYCLINE HYCLATE 100 MG PO TABS
100.0000 mg | ORAL_TABLET | Freq: Once | ORAL | Status: AC
  Administered 2023-03-29: 100 mg via ORAL
  Filled 2023-03-29: qty 1

## 2023-03-29 NOTE — ED Notes (Signed)
Wound care provided to patient.

## 2023-03-29 NOTE — Discharge Instructions (Addendum)
You were seen in the Emergency Department for wound evaluation There does not appear to be a severe infection of your wound based on your blood work and the appearance of the wound We repacked the wound for you Please follow-up with your wound care nurse to discuss further management We gave you your first dose of antibiotics here Please pick up the prescribed doxycycline from your pharmacy and begin taking as directed twice daily for the next 7 days to help prevent infection Return to the emergency department for fevers chills severe pain or any other concerns

## 2023-03-29 NOTE — ED Provider Notes (Signed)
[place] EMERGENCY DEPARTMENT AT [facility] Provider Note   CSN: [id number] Arrival date & time: 03/29/23  1047     History  Chief Complaint  Patient presents with   Wound Check    Shannon Navarro is a 67 y.o. female.  Who presents to the ED for wound reevaluation.  She underwent removal of a large lipoma over her left back on October 8 with debridement on November 7.  She has been seen by home wound care nurse and has had a wound VAC in place since that time.  Today she reports that her wound VAC became blocked at home.  She became concerned with increased drainage from the wound site and has come to the ED for further evaluation of her wound given concern for potential infection.  Recently completed a course of doxycycline.  No fevers chills or significant pain in this area.   Wound Check       Home Medications Prior to Admission medications   Medication Sig Start Date End Date Taking? Authorizing Provider  doxycycline (VIBRAMYCIN) 100 MG capsule Take 1 capsule (100 mg total) by mouth 2 (two) times daily. 03/29/23  Yes Estelle June A, DO  amLODipine (NORVASC) 2.5 MG tablet Take 1 tablet (2.5 mg total) by mouth daily. 04/21/22   Myrlene Broker, MD  Ascorbic Acid (VITAMIN C PO) Take 1 tablet by mouth daily.    [provider]  cephALEXin (KEFLEX) 500 MG capsule Take 1 capsule (500 mg total) by mouth 4 (four) times daily. 03/14/23   Benjiman Core, MD  fluticasone (FLONASE) 50 MCG/ACT nasal spray Place 2 sprays into both nostrils daily. Patient taking differently: Place 2 sprays into both nostrils daily as needed for allergies. 02/18/21   Myrlene Broker, MD  loratadine (CLARITIN) 10 MG tablet Take 10 mg by mouth in the morning.    [provider]  metoprolol tartrate (LOPRESSOR) 25 MG tablet Take 0.5 tablets (12.5 mg total) by mouth daily. Patient taking differently: Take 12.5 mg by mouth daily as needed (palpitation/afib). 12/29/22    Rollene Rotunda, MD  omeprazole (PRILOSEC OTC) 20 MG tablet Take 20 mg by mouth daily before breakfast.    [provider]  traMADol (ULTRAM) 50 MG tablet Take 1 tablet (50 mg total) by mouth every 6 (six) hours as needed. Patient not taking: Reported on 03/25/2023 03/19/23   [name], MD  VITAMIN D PO Take 1 tablet by mouth in the morning. Gummy    [provider]      Allergies    Cefaclor, Cipro [ciprofloxacin hcl], Codeine, Hydrocodone, Levaquin [levofloxacin], Nexium [esomeprazole magnesium], Penicillins, and Sulfa antibiotics    Review of Systems   Review of Systems  Physical Exam Updated Vital Signs BP 121/64 (BP Location: Right Arm)   Pulse 71   Temp 98.9 F (37.2 C) (Oral)   Resp 16   Ht 5\' 6"  (1.676 m)   Wt 99.8 kg   SpO2 97%   BMI 35.51 kg/m  Physical Exam Vitals and nursing note reviewed.  HENT:     Head: Normocephalic and atraumatic.  Eyes:     Pupils: Pupils are equal, round, and reactive to light.  Cardiovascular:     Rate and Rhythm: Normal rate and regular rhythm.  Pulmonary:     Effort: Pulmonary effort is normal.     Breath sounds: Normal breath sounds.  Abdominal:     Palpations: Abdomen is soft.     Tenderness: There  is no abdominal tenderness.  Skin:    General: Skin is warm and dry.     Comments: Large open wound over left mid back with gauze packing in place Upon removal of the gauze packing there is scant serosanguineous drainage without significant surrounding erythema induration or fluctuance  Neurological:     Mental Status: She is alert.  Psychiatric:        Mood and Affect: Mood normal.     ED Results / Procedures / Treatments   Labs (all labs ordered are listed, but only abnormal results are displayed) Labs Reviewed  COMPREHENSIVE METABOLIC PANEL - Abnormal; Notable for the following components:      Result Value   Total Protein 6.2 (*)    Albumin 3.3 (*)    All other components within normal limits  CBC  WITH DIFFERENTIAL/PLATELET    EKG None  Radiology No results found.  Procedures Procedures    Medications Ordered in ED Medications  doxycycline (VIBRA-TABS) tablet 100 mg (100 mg Oral Given 03/29/23 1532)    ED Course/ Medical Decision Making/ A&P                                 Medical Decision Making 67 year old female with history as above including recent lipoma block removal with subsequent debridement presenting for concern of potential wound infection.  The large wound over her left back is well-appearing overall.  I took down the dressing and remove the gauze packing.  There is scant serosanguineous drainage but no overt purulence or increased redness that would be worrisome for severe infection.  Recently completed course of outpatient p.o. Keflex.  No leukocytosis or significant laboratory abnormality on workup here.  Will discharge on doxycycline for infection prophylaxis and instruct for wound care follow-up with her wound care nurse and surgery team.  Amount and/or Complexity of Data Reviewed Labs: ordered.  Risk Prescription drug management.           Final Clinical Impression(s) / ED Diagnoses Final diagnoses:  Wound of back, left, initial encounter    Rx / DC Orders ED Discharge Orders          Ordered    doxycycline (VIBRAMYCIN) 100 MG capsule  2 times daily        03/29/23 1521              Royanne Foots, DO 03/29/23 1758

## 2023-03-29 NOTE — ED Triage Notes (Signed)
Pt BIB EMS for a wound check. Home health nurse stated she has pus in wound and wound vac is not working. 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  Axox4. VSS.

## 2023-03-30 ENCOUNTER — Ambulatory Visit (INDEPENDENT_AMBULATORY_CARE_PROVIDER_SITE_OTHER): Payer: Medicare Other | Admitting: Internal Medicine

## 2023-03-30 ENCOUNTER — Encounter: Payer: Self-pay | Admitting: Internal Medicine

## 2023-03-30 VITALS — BP 128/84 | HR 56 | Temp 97.9°F | Ht 66.0 in | Wt 228.0 lb

## 2023-03-30 DIAGNOSIS — I10 Essential (primary) hypertension: Secondary | ICD-10-CM

## 2023-03-30 DIAGNOSIS — E538 Deficiency of other specified B group vitamins: Secondary | ICD-10-CM | POA: Diagnosis not present

## 2023-03-30 DIAGNOSIS — E559 Vitamin D deficiency, unspecified: Secondary | ICD-10-CM

## 2023-03-30 DIAGNOSIS — R5383 Other fatigue: Secondary | ICD-10-CM

## 2023-03-30 DIAGNOSIS — E041 Nontoxic single thyroid nodule: Secondary | ICD-10-CM

## 2023-03-30 NOTE — Progress Notes (Unsigned)
   Subjective:   Patient ID: Shannon Navarro, female    DOB: 1955/08/13, 67 y.o.   MRN: [id number]  HPI The patient is a 67 YO female coming in for follow up thyroid and fatigue.  Review of Systems  Objective:  Physical Exam  Vitals:   03/30/23 1514  BP: 128/84  Pulse: (!) 56  Temp: 97.9 F (36.6 C)  TempSrc: Oral  SpO2: 98%  Weight: 228 lb (103.4 kg)  Height: 5\' 6"  (1.676 m)    Assessment & Plan:

## 2023-03-30 NOTE — Telephone Encounter (Signed)
 Attempted to call patient, no answer left message requesting a call back.

## 2023-03-30 NOTE — Patient Instructions (Signed)
2000 international units  for vitamin d

## 2023-03-30 NOTE — Telephone Encounter (Signed)
Patient identification verified by 2 forms. Marilynn Rail, RN    Received call from patient  Relayed provider message  Patient has no further concerns at this time

## 2023-03-30 NOTE — Telephone Encounter (Signed)
My noted has been addended, thanks.

## 2023-03-31 LAB — VITAMIN B12: Vitamin B-12: 398 pg/mL (ref 211–911)

## 2023-03-31 LAB — VITAMIN D 25 HYDROXY (VIT D DEFICIENCY, FRACTURES): VITD: 50.79 ng/mL (ref 30.00–100.00)

## 2023-03-31 LAB — TSH: TSH: 0.92 u[IU]/mL (ref 0.35–5.50)

## 2023-03-31 NOTE — Assessment & Plan Note (Signed)
Checking TSH and US thyroid (due for yearly follow up 5 years this will be year 2). Adjust as needed.

## 2023-03-31 NOTE — Assessment & Plan Note (Signed)
Checking B12 and vitamin D and TSH. Adjust as needed. Recent CBC and CMP stable.

## 2023-03-31 NOTE — Assessment & Plan Note (Signed)
Checking vitamin D level and adjust as needed.  

## 2023-03-31 NOTE — Assessment & Plan Note (Signed)
BP at goal on amlodipine 2.5 mg daily and metoprolol 12.5 mg daily and recent CMP normal continue.

## 2023-04-30 ENCOUNTER — Ambulatory Visit
Admission: RE | Admit: 2023-04-30 | Discharge: 2023-04-30 | Disposition: A | Discharge status: Home / Self Care | Payer: Medicare Other | Source: Ambulatory Visit | Attending: Internal Medicine | Admitting: Internal Medicine

## 2023-04-30 DIAGNOSIS — E041 Nontoxic single thyroid nodule: Secondary | ICD-10-CM

## 2023-05-08 ENCOUNTER — Telehealth: Payer: Self-pay | Admitting: Cardiology

## 2023-05-08 NOTE — Telephone Encounter (Signed)
Patient identification verified by 2 forms. Shannon Rail, RN    Called and spoke to patient  Patient states:   -she went to ED 09/2022  -recently reviewed records and noted abnormal chest xray from 09/2022 ED visit   -chest xray noted "enlarged heart"   -is concerned that this is a new finding and it has not been reviewed   -saw Dr. Antoine Poche on 12/26/22 and this was not discussed  Informed patient:  -per 8/16 visit note it states provider reviewed records  -RN will send message to Dr. Antoine Poche for input/advisement  Patient verbalized understanding, no questions at this time   Chest xray 09/23/22:

## 2023-05-08 NOTE — Telephone Encounter (Signed)
Patient identification verified by 2 forms. Marilynn Rail, RN    Called and spoke to patient  Relayed provider message below  Patient states she will follow up at 3/5 OV  Patient has no further questions at this time

## 2023-05-08 NOTE — Telephone Encounter (Signed)
Pt is requesting a callback from nurse regarding her chest xray that was done back in May. Please advise

## 2023-05-08 NOTE — Telephone Encounter (Signed)
Rollene Rotunda, MD  You1 hour ago (1:30 PM)    CXR is not a very precise way to judge heart size.  Echo is the way to judge heart size and hers was not enlarged on echocardiogram in late 2023.  If she has increased symptoms such as shortness of breath or pain we could repeat an echo.  Otherwise, I would not suggest a change in therapy or testing based on this CXR.

## 2023-05-12 ENCOUNTER — Other Ambulatory Visit: Payer: Self-pay | Admitting: Internal Medicine

## 2023-05-18 ENCOUNTER — Other Ambulatory Visit: Payer: Medicare Other

## 2023-05-19 ENCOUNTER — Ambulatory Visit: Payer: Medicare Other | Admitting: Cardiology

## 2023-05-20 ENCOUNTER — Telehealth: Payer: Self-pay | Admitting: Internal Medicine

## 2023-05-20 DIAGNOSIS — G4733 Obstructive sleep apnea (adult) (pediatric): Secondary | ICD-10-CM

## 2023-05-20 NOTE — Telephone Encounter (Signed)
 Copied from CRM [id number]. Topic: Referral - Request for Referral >> May 20, 2023  8:57 AM Thersia BROCKS wrote: Did the patient discuss referral with their provider in the last year? Yes (If No - schedule appointment) (If Yes - send message)  Appointment offered? Yes  Type of order/referral and detailed reason for visit: Wants a checkup for her cpap machine   Preference of office, provider, location: Dr. Carolynne Bracken , [address] [place] [address] Phone number [phone]  If referral order, have you been seen by this specialty before? Yes (If Yes, this issue or another issue? When? Where?  Can we respond through MyChart? Yes

## 2023-05-21 NOTE — Telephone Encounter (Signed)
 Referral placed.

## 2023-06-08 LAB — LAB REPORT - SCANNED
Free T4: 1.01 ng/dL
TSH: 0.9 (ref 0.41–5.90)

## 2023-06-12 ENCOUNTER — Other Ambulatory Visit: Payer: Self-pay

## 2023-06-12 ENCOUNTER — Telehealth: Payer: Self-pay

## 2023-06-12 ENCOUNTER — Other Ambulatory Visit (HOSPITAL_BASED_OUTPATIENT_CLINIC_OR_DEPARTMENT_OTHER): Payer: Self-pay

## 2023-06-12 MED ORDER — AMLODIPINE BESYLATE 2.5 MG PO TABS: 2.5000 mg | ORAL_TABLET | Freq: Every day | ORAL | 0 refills | Status: DC

## 2023-06-12 MED ORDER — MUPIROCIN 2 % EX OINT
1.0000 | TOPICAL_OINTMENT | Freq: Every day | CUTANEOUS | 0 refills | Status: DC
  Filled 2023-06-12: qty 22, 22d supply, fill #0

## 2023-06-12 NOTE — Telephone Encounter (Signed)
Copied from CRM [id number]. Topic: Clinical - Prescription Issue >> Jun 12, 2023  2:20 PM [place] wrote: Reason for CRM: Patient needs to have prescription for amLODipine (NORVASC) 2.5 MG tablet to be faxed or called into her [place] [facility] phone # [phone] and fax# [phone]

## 2023-06-12 NOTE — Telephone Encounter (Signed)
 This has been faxed.

## 2023-06-17 ENCOUNTER — Encounter: Payer: Self-pay | Admitting: Internal Medicine

## 2023-06-17 ENCOUNTER — Ambulatory Visit: Payer: Medicare Other | Admitting: Internal Medicine

## 2023-06-17 VITALS — BP 120/88 | HR 64 | Temp 98.3°F | Ht 66.0 in | Wt 224.0 lb

## 2023-06-17 DIAGNOSIS — E041 Nontoxic single thyroid nodule: Secondary | ICD-10-CM | POA: Diagnosis not present

## 2023-06-17 DIAGNOSIS — I10 Essential (primary) hypertension: Secondary | ICD-10-CM

## 2023-06-17 DIAGNOSIS — M791 Myalgia, unspecified site: Secondary | ICD-10-CM

## 2023-06-17 DIAGNOSIS — R7303 Prediabetes: Secondary | ICD-10-CM

## 2023-06-17 DIAGNOSIS — Z23 Encounter for immunization: Secondary | ICD-10-CM | POA: Diagnosis not present

## 2023-06-17 DIAGNOSIS — E559 Vitamin D deficiency, unspecified: Secondary | ICD-10-CM | POA: Diagnosis not present

## 2023-06-17 DIAGNOSIS — J301 Allergic rhinitis due to pollen: Secondary | ICD-10-CM

## 2023-06-17 DIAGNOSIS — G4733 Obstructive sleep apnea (adult) (pediatric): Secondary | ICD-10-CM

## 2023-06-17 LAB — COMPREHENSIVE METABOLIC PANEL
ALT: 17 U/L (ref 0–35)
AST: 20 U/L (ref 0–37)
Albumin: 4.1 g/dL (ref 3.5–5.2)
Alkaline Phosphatase: 76 U/L (ref 39–117)
BUN: 13 mg/dL (ref 6–23)
CO2: 29 meq/L (ref 19–32)
Calcium: 9.4 mg/dL (ref 8.4–10.5)
Chloride: 107 meq/L (ref 96–112)
Creatinine, Ser: 0.72 mg/dL (ref 0.40–1.20)
GFR: 87.18 mL/min (ref >60.00)
Glucose, Bld: 102 mg/dL — ABNORMAL HIGH (ref 70–99)
Potassium: 3.9 meq/L (ref 3.5–5.1)
Sodium: 143 meq/L (ref 135–145)
Total Bilirubin: 1.2 mg/dL (ref 0.2–1.2)
Total Protein: 7.2 g/dL (ref 6.0–8.3)

## 2023-06-17 LAB — LIPID PANEL
Cholesterol: 168 mg/dL (ref 0–200)
HDL: 50.6 mg/dL (ref >39.00)
LDL Cholesterol: 102 mg/dL — ABNORMAL HIGH (ref 0–99)
NonHDL: 117.35
Total CHOL/HDL Ratio: 3
Triglycerides: 77 mg/dL (ref 0.0–149.0)
VLDL: 15.4 mg/dL (ref 0.0–40.0)

## 2023-06-17 LAB — VITAMIN D 25 HYDROXY (VIT D DEFICIENCY, FRACTURES): VITD: 44.47 ng/mL (ref 30.00–100.00)

## 2023-06-17 LAB — HEMOGLOBIN A1C: Hgb A1c MFr Bld: 5.9 % (ref 4.6–6.5)

## 2023-06-17 LAB — MAGNESIUM: Magnesium: 1.9 mg/dL (ref 1.5–2.5)

## 2023-06-17 NOTE — Assessment & Plan Note (Signed)
 Checking HgA1c and adjust as needed. Recent sugar on lab elevated.

## 2023-06-17 NOTE — Assessment & Plan Note (Signed)
 Examined ears and no infection. She is using flonase  and allergy medication regularly advised to continue.

## 2023-06-17 NOTE — Assessment & Plan Note (Signed)
 BMI 36 and complicated by arthritis and GERD and hypertension. Counseled about exercise.

## 2023-06-17 NOTE — Assessment & Plan Note (Signed)
 BP at goal on amlodipine  2.5 mg daily. Checking CMP and magnesium.

## 2023-06-17 NOTE — Progress Notes (Signed)
   Subjective:   Patient ID: Shannon Navarro, female    DOB: 07-23-1955, 68 y.o.   MRN: [id number]  HPI The patient is a 68 YO female coming in for concerns about thyroid  levels. Did some online testing and has results to review. Also has other concerns to discuss.   Review of Systems  Constitutional:  Positive for fatigue.  HENT:  Positive for congestion and ear pain.        Hair loss  Eyes: Negative.   Respiratory:  Negative for cough, chest tightness and shortness of breath.   Cardiovascular:  Negative for chest pain, palpitations and leg swelling.  Gastrointestinal:  Negative for abdominal distention, abdominal pain, constipation, diarrhea, nausea and vomiting.  Musculoskeletal:  Positive for myalgias.  Skin: Negative.   Neurological: Negative.   Psychiatric/Behavioral: Negative.      Objective:  Physical Exam Constitutional:      Appearance: She is well-developed.  HENT:     Head: Normocephalic and atraumatic.     Right Ear: Tympanic membrane normal.     Left Ear: Tympanic membrane normal.     Ears:     Comments: Chronic changes to both TM but no active infection Cardiovascular:     Rate and Rhythm: Normal rate and regular rhythm.  Pulmonary:     Effort: Pulmonary effort is normal. No respiratory distress.     Breath sounds: Normal breath sounds. No wheezing or rales.  Abdominal:     General: Bowel sounds are normal. There is no distension.     Palpations: Abdomen is soft.     Tenderness: There is no abdominal tenderness. There is no rebound.  Musculoskeletal:        General: Tenderness present.     Cervical back: Normal range of motion.  Skin:    General: Skin is warm and dry.  Neurological:     Mental Status: She is alert and oriented to person, place, and time.     Coordination: Coordination normal.     Vitals:   06/17/23 0912  BP: 120/88  Pulse: 64  Temp: 98.3 F (36.8 C)  TempSrc: Oral  SpO2: 97%  Weight: 224 lb (101.6 kg)  Height: 5' 6 (1.676  m)    Assessment & Plan:  Flu shot given at visit

## 2023-06-17 NOTE — Assessment & Plan Note (Signed)
 Needs referral to pulmonary and would like to follow prior provider. Dr. Arrie Lares to his new practice.

## 2023-06-17 NOTE — Assessment & Plan Note (Signed)
 Checking CMP and magnesium to assess. She is unsure if thyroid  could be causing this and reassured this is less likely.

## 2023-06-17 NOTE — Patient Instructions (Signed)
 We will get you in with the endocrinologist and check the labs today.

## 2023-06-17 NOTE — Assessment & Plan Note (Signed)
 Recent labs she ordered off [employer] with normal TSH, normal free T4 and low free T3. Discussed etiology of these tests and recent US  of thyroid . She is still concerned that the results may be impacting her symptoms. Referral to endo to review and decide if any further testing is needed or if her current symptoms could be coming from thyroid  level.

## 2023-06-19 ENCOUNTER — Encounter: Payer: Self-pay | Admitting: Internal Medicine

## 2023-06-19 ENCOUNTER — Telehealth: Payer: Self-pay

## 2023-06-19 NOTE — Telephone Encounter (Signed)
 Copied from CRM [id number]. Topic: Referral - Question >> Jun 19, 2023 12:17 PM Isabell A wrote: Reason for CRM: Patient is calling to confirm if anyone has called  Dr.Snoods' office to schedule her appointment, states she's been waiting and would like a call back in regard to this.     Dr. Carolynne Bracken  [address] [place] [address]   Phone number [phone]

## 2023-06-19 NOTE — Telephone Encounter (Unsigned)
 Copied from CRM [id number]. Topic: Clinical - Medication Question >> Jun 19, 2023  3:37 PM Deaijah H wrote: Reason for CRM: Patient would like to what magnesium to take and how much // would like a call back today if possible because she stated she's hurting please [phone]

## 2023-06-22 NOTE — Telephone Encounter (Signed)
**Note De-identified  [place] Obfuscation** Please advise 

## 2023-07-02 ENCOUNTER — Ambulatory Visit: Payer: Medicare Other

## 2023-07-07 ENCOUNTER — Other Ambulatory Visit (HOSPITAL_BASED_OUTPATIENT_CLINIC_OR_DEPARTMENT_OTHER): Payer: Self-pay

## 2023-07-07 MED ORDER — DOXYCYCLINE HYCLATE 100 MG PO TABS
100.0000 mg | ORAL_TABLET | Freq: Two times a day (BID) | ORAL | 0 refills | Status: DC
  Filled 2023-07-07: qty 20, 10d supply, fill #0

## 2023-07-08 ENCOUNTER — Other Ambulatory Visit (HOSPITAL_BASED_OUTPATIENT_CLINIC_OR_DEPARTMENT_OTHER): Payer: Self-pay

## 2023-07-10 ENCOUNTER — Other Ambulatory Visit (HOSPITAL_BASED_OUTPATIENT_CLINIC_OR_DEPARTMENT_OTHER): Payer: Self-pay

## 2023-07-10 ENCOUNTER — Ambulatory Visit: Payer: Medicare Other | Admitting: Internal Medicine

## 2023-07-10 ENCOUNTER — Encounter: Payer: Self-pay | Admitting: Internal Medicine

## 2023-07-10 VITALS — BP 120/84 | HR 53 | Temp 98.2°F | Ht 66.0 in | Wt 221.0 lb

## 2023-07-10 DIAGNOSIS — J301 Allergic rhinitis due to pollen: Secondary | ICD-10-CM | POA: Diagnosis not present

## 2023-07-10 DIAGNOSIS — I10 Essential (primary) hypertension: Secondary | ICD-10-CM

## 2023-07-10 DIAGNOSIS — R7303 Prediabetes: Secondary | ICD-10-CM

## 2023-07-10 DIAGNOSIS — K219 Gastro-esophageal reflux disease without esophagitis: Secondary | ICD-10-CM | POA: Diagnosis not present

## 2023-07-10 DIAGNOSIS — E041 Nontoxic single thyroid nodule: Secondary | ICD-10-CM

## 2023-07-10 DIAGNOSIS — Z0000 Encounter for general adult medical examination without abnormal findings: Secondary | ICD-10-CM

## 2023-07-10 LAB — CBC
HCT: 43.2 % (ref 36.0–46.0)
Hemoglobin: 14.2 g/dL (ref 12.0–15.0)
MCHC: 32.9 g/dL (ref 30.0–36.0)
MCV: 85.2 fl (ref 78.0–100.0)
Platelets: 247 10*3/uL (ref 150.0–400.0)
RBC: 5.06 Mil/uL (ref 3.87–5.11)
RDW: 14.3 % (ref 11.5–15.5)
WBC: 6.7 10*3/uL (ref 4.0–10.5)

## 2023-07-10 MED ORDER — AMLODIPINE BESYLATE 2.5 MG PO TABS
2.5000 mg | ORAL_TABLET | Freq: Every day | ORAL | 3 refills | Status: DC
  Filled 2023-07-10: qty 90, 90d supply, fill #0

## 2023-07-10 MED ORDER — FLUTICASONE PROPIONATE 50 MCG/ACT NA SUSP
2.0000 | Freq: Every day | NASAL | 6 refills | Status: AC
  Filled 2023-07-10: qty 48, 90d supply, fill #0

## 2023-07-10 MED ORDER — AMLODIPINE BESYLATE 2.5 MG PO TABS: 2.5000 mg | ORAL_TABLET | Freq: Every day | ORAL | 0 refills | Status: DC

## 2023-07-10 NOTE — Assessment & Plan Note (Signed)
 Continue intermittent monitoring of HgA1c.

## 2023-07-10 NOTE — Assessment & Plan Note (Signed)
 Will need yearly thyroid monitoring for several more years.

## 2023-07-10 NOTE — Progress Notes (Signed)
 Subjective:   Patient ID: Shannon Navarro, female    DOB: [birth year], 68 y.o.   MRN: [id number]  HPI Here for initial  medicare wellness, and follow up chronic conditions, no new complaints. Please see A/P for status and treatment of chronic medical problems.   Diet: heart healthy Physical activity: walking Depression/mood screen: negative Hearing: intact to whispered voice Visual acuity: grossly normal with lens, performs annual eye exam  ADLs: capable Fall risk: none Home safety: good Cognitive evaluation: intact to orientation, naming, recall and repetition EOL planning: adv directives discussed  Flowsheet Row Office Visit from 06/17/2023 in [facility] [place]  PHQ-2 Total Score 0       Flowsheet Row Office Visit from 06/10/2022 in [facility] [place]  PHQ-9 Total Score 0         04/12/2022    8:00 PM 04/13/2022    8:30 AM 04/21/2022   10:34 AM 06/10/2022    3:01 PM 06/17/2023    9:23 AM  Fall Risk  Falls in the past year?   0 0 0  Was there an injury with Fall?   0 0 0  Fall Risk Category Calculator   0 0 0  Fall Risk Category (Retired)   Low    (RETIRED) Patient Fall Risk Level High fall risk Low fall risk     Patient at Risk for Falls Due to    No Fall Risks   Fall risk Follow up   Falls evaluation completed Falls evaluation completed Falls evaluation completed   I have personally reviewed and have noted 1. The patient's medical and social history - reviewed today no changes 2. Their use of alcohol, tobacco or illicit drugs 3. Their current medications and supplements 4. The patient's functional ability including ADL's, fall risks, home safety risks and hearing or visual impairment. 5. Diet and physical activities 6. Evidence for depression or mood disorders 7. Care team reviewed and updated 8.  The patient is not on an opioid pain medication  Patient Care Team: Myrlene Broker, MD as PCP - General  (Internal Medicine) Rollene Rotunda, MD as PCP - Cardiology (Cardiology) Hart Carwin, MD (Inactive) as Consulting Physician (Gastroenterology) Huel Cote, MD as Consulting Physician (Obstetrics and Gynecology) Coralyn Helling, MD (Inactive) (Pulmonary Disease) [name], MD as Consulting Physician (General Surgery) Past Medical History:  Diagnosis Date   Allergic rhinitis    Allergy    Anticoagulant long-term use    XARELTO no longer taking   Bile duct stone    Dysrhythmia    hx of atrial fib x 2 per pt   Endometrial polyp    Endometrial polyp    Fatty liver 06/23/2007   GERD    Herniated disc, 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   History of esophageal stricture    mainly due to gerd   History of exercise stress test 06/13/2015   PER DR HOCHREIN NOTE -- NO EVIDENCE ISCHEMIA   HYPERTENSION    pt denies   Impaired fasting glucose    Migraine    hx of migraines none recent   OSA on CPAP followed by dr Craige Cotta   per study 02-09-2012  severe osa w/ AHI 33.8   Paroxysmal atrial fibrillation (HCC) cardioloigst-  dr Antoine Poche   dx 02-21-2016 at ED   Pneumonia    with COVID, double, enlarged heart   PONV (postoperative nausea and vomiting)    after surgery in 10/2021- had nausea after  surgery related to preop drink   Pre-diabetes    Pseudotumor cerebri 2007   S/P cervical spinal fusion 10/24/2021   Sleep apnea    Thyroid nodule    Vitamin D deficiency 07/2013   Past Surgical History:  Procedure Laterality Date   ABDOMINAL HYSTERECTOMY     ANTERIOR CERVICAL DECOMPRESSION/DISCECTOMY FUSION 4 LEVELS N/A 10/24/2021   Procedure: ANTERIOR CERVICAL DECOMPRESSION FUSION CERVICAL FOUR THROUGH CERVICAL FIVE, CERVICAL FIVE THROUGH CERVICAL SIX, CERVICAL SIX THROUGH CERVICAL SEVEN WITH INSTRUMENTATION AND ALLOGRAFT;  Surgeon: Estill Bamberg, MD;  Location: [facility] OR;  Service: Orthopedics;  Laterality: N/A;   BALLOON DILATION N/A 04/11/2022   Procedure: BALLOON DILATION;  Surgeon: Lynann Bologna, MD;   Location: [facility];  Service: Gastroenterology;  Laterality: N/A;   BIOPSY  04/11/2022   Procedure: BIOPSY;  Surgeon: Lynann Bologna, MD;  Location: [facility];  Service: Gastroenterology;;   BREAST BIOPSY Right 08/12/2019    FIBROADENOMA    CHOLECYSTECTOMY N/A 04/10/2022   Procedure: LAPAROSCOPIC CHOLECYSTECTOMY WITH INTRAOPERATIVE CHOLANGIOGRAM;  Surgeon: Rodman Pickle, MD;  Location: [facility]S;  Service: General;  Laterality: N/A;   COLONOSCOPY     colonscopy  2007   normal   DILATATION & CURETTAGE/HYSTEROSCOPY WITH MYOSURE N/A 03/26/2017   Procedure: DILATATION & CURETTAGE/HYSTEROSCOPY WITH MYOSURE;  Surgeon: Huel Cote, MD;  Location: [facility];  Service: Gynecology;  Laterality: N/A;   DILATION AND CURETTAGE OF UTERUS  2010   ERCP N/A 04/11/2022   Procedure: ENDOSCOPIC RETROGRADE CHOLANGIOPANCREATOGRAPHY (ERCP);  Surgeon: Lynann Bologna, MD;  Location: [facility];  Service: Gastroenterology;  Laterality: N/A;   ESOPHAGOGASTRODUODENOSCOPY N/A 04/11/2022   Procedure: ESOPHAGOGASTRODUODENOSCOPY (EGD);  Surgeon: Lynann Bologna, MD;  Location: [facility];  Service: Gastroenterology;  Laterality: N/A;   FRACTURE SURGERY     IRRIGATION AND DEBRIDEMENT ABSCESS N/A 03/19/2023   Procedure: WOUND EXPLORATION AND DRAIN PLACEMENT;  Surgeon: [name], MD;  Location: [facility];  Service: General;  Laterality: N/A;   LAPAROSCOPIC HYSTERECTOMY Bilateral 06/18/2017   Procedure: HYSTERECTOMY TOTAL LAPAROSCOPIC;  Surgeon: Huel Cote, MD;  [facility];  Service: Gynecology;  Laterality: Bilateral;  OUT PT IN BED, needs extra time MD request 2.5hrs in OR   MASS EXCISION Left 02/17/2023   Procedure: EXCISION SOFT TISSUE MASS LEFT BACK;  Surgeon: [name], MD;  Location: [facility];  Service: General;  Laterality: Left;   REMOVAL OF STONES  04/11/2022   Procedure: REMOVAL OF STONES;  Surgeon: Lynann Bologna, MD;  Location: [facility];   Service: Gastroenterology;;   Dennison Mascot  04/11/2022   Procedure: Dennison Mascot;  Surgeon: Lynann Bologna, MD;  Location: [facility];  Service: Gastroenterology;;   SPINE SURGERY     TONSILLECTOMY Bilateral 01/10/2022   Procedure: TONSILLECTOMY;  Surgeon: Serena Colonel, MD;  Location: [facility] OR;  Service: ENT;  Laterality: Bilateral;   torn meniscus surgery on right knee      TRANSTHORACIC ECHOCARDIOGRAM  03/11/2016   ef 60-65%/  mild MR/ trivial PR and TR   TUBAL LIGATION     UPPER GASTROINTESTINAL ENDOSCOPY  11/2020   Family History  Problem Relation Age of Onset   Breast cancer Mother        in 49's   Asthma Mother    Hypertension Mother    Prostate cancer Father    Aortic stenosis Father    Hypertension Father    Colon polyps Father    Heart disease Father    Diabetes Father    Breast cancer Maternal Aunt  Breast cancer Maternal Aunt    Breast cancer Cousin    Esophageal cancer Other    Stomach cancer Other    Colon cancer Other    Rectal cancer Neg Hx    Review of Systems  Constitutional: Negative.   HENT: Negative.    Eyes: Negative.   Respiratory:  Negative for cough, chest tightness and shortness of breath.   Cardiovascular:  Negative for chest pain, palpitations and leg swelling.  Gastrointestinal:  Negative for abdominal distention, abdominal pain, constipation, diarrhea, nausea and vomiting.  Musculoskeletal: Negative.   Skin: Negative.   Neurological: Negative.   Psychiatric/Behavioral: Negative.      Objective:  Physical Exam Constitutional:      Appearance: She is well-developed.  HENT:     Head: Normocephalic and atraumatic.  Cardiovascular:     Rate and Rhythm: Normal rate and regular rhythm.  Pulmonary:     Effort: Pulmonary effort is normal. No respiratory distress.     Breath sounds: Normal breath sounds. No wheezing or rales.  Abdominal:     General: Bowel sounds are normal. There is no distension.     Palpations: Abdomen is soft.      Tenderness: There is no abdominal tenderness. There is no rebound.  Musculoskeletal:     Cervical back: Normal range of motion.  Skin:    General: Skin is warm and dry.  Neurological:     Mental Status: She is alert and oriented to person, place, and time.     Coordination: Coordination normal.     Vitals:   07/10/23 0901  BP: 120/84  Pulse: (!) 53  Temp: 98.2 F (36.8 C)  TempSrc: Oral  SpO2: 98%  Weight: 221 lb (100.2 kg)  Height: 5\' 6"  (1.676 m)    Assessment & Plan:

## 2023-07-10 NOTE — Assessment & Plan Note (Signed)
 Flu shot yearly. Pneumonia complete. Shingrix complete. Tetanus up to date. Colonoscopy up to date. Mammogram up to date, pap smear up to date and dexa up to date. Counseled about sun safety and mole surveillance. Counseled about the dangers of distracted driving. Given 10 year screening recommendations.

## 2023-07-10 NOTE — Assessment & Plan Note (Signed)
 BP at goal on amlodipine and will continue.

## 2023-07-10 NOTE — Assessment & Plan Note (Signed)
 Refill flonase which is effective for her.

## 2023-07-10 NOTE — Assessment & Plan Note (Signed)
 Using otc for now and adequate control.

## 2023-07-10 NOTE — Assessment & Plan Note (Signed)
 BMI 35 and complicated by hypertension and OSA. Counseled about weight loss and she is down several pounds this year already. Encouraged.

## 2023-07-10 NOTE — Patient Instructions (Signed)
  Shannon Navarro , Thank you for taking time to come for your Medicare Wellness Visit. I appreciate your ongoing commitment to your health goals. Please review the following plan we discussed and let me know if I can assist you in the future.   These are the goals we discussed:  Goals   None     This is a list of the screening recommended for you and due dates:  Health Maintenance  Topic Date Due   DTaP/Tdap/Td vaccine (2 - Tdap) 12/15/2019   COVID-19 Vaccine (5 - 2024-25 season) 01/11/2023   Medicare Annual Wellness Visit  07/09/2024   Mammogram  08/17/2024   Colon Cancer Screening  06/25/2030   Pneumonia Vaccine  Completed   Flu Shot  Completed   DEXA scan (bone density measurement)  Completed   Hepatitis C Screening  Completed   Zoster (Shingles) Vaccine  Completed   HPV Vaccine  Aged Out

## 2023-07-12 DIAGNOSIS — R0789 Other chest pain: Secondary | ICD-10-CM | POA: Insufficient documentation

## 2023-07-12 NOTE — Progress Notes (Unsigned)
 Cardiology Office Note:   Date:  07/15/2023  ID:  Shannon Navarro, DOB Jul 13, 1955, MRN [id number] PCP: Myrlene Broker, MD  [facility] Providers Cardiologist:  Rollene Rotunda, MD {  History of Present Illness:   Shannon Navarro is a 68 y.o. female who I saw her previously for cardiomegaly on the chest x-ray.  She was also found on a chest x-ray to have aortic atherosclerosis.  She has had a history of bradycardia.   She had an outpatient exercise tolerance test was ordered and completed on 06/13/2015, there was no evidence of ischemia, she was able to achieve 85% maximal heart rate after 6 minutes, no ST segment deviation was noted. The patient presented to the emergency room on 02/21/2016 with palpitation that woke her up from sleep and found to have new onset of atrial fibrillation. Patient spontaneously converted. Given her elevated CHA2DS2-Vasc score 2 (female and HTN), she was placed on Xarelto.    Eventually I took her off of this because she has had no recurrent fibrillation.   She was in the ED with atrial fib in May 2024.  She wears Apple Watch and she has had no high rates alerts.  She feels okay.  She actually does have some low heart rates in the 3839 when she is sitting doing nothing at times but goes up quickly when she gets up and moves around.  She might feel little lightheaded but she has not had any presyncope or syncope.  She denies any chest pressure, neck or arm discomfort.  She had no weight gain or edema.  Of note her atrial fibrillation occurred at the time when she was getting therapy for a nonhealing wound associated with her lipoma removal.  This is a complicated course.  She has had none of the atrial fibrillation symptomatically and she felt it prior.   ROS: As stated in the HPI and negative for all other systems.   Studies Reviewed:    EKG:   NA  Risk Assessment/Calculations:    CHA2DS2-VASc Score =   CHA2DS2-VASc Score = 4 [CHF History: 0, HTN  History: 1, Diabetes History: 0, Stroke History: 0, Vascular Disease History: 1, Age Score: 1, Gender Score: 1].  Therefore, the patient's annual risk of stroke is 4.8 %.   Physical Exam:   VS:  BP 122/76   Pulse (!) 56   Ht 5\' 6"  (1.676 m)   Wt 218 lb (98.9 kg)   SpO2 97%   BMI 35.19 kg/m    Wt Readings from Last 3 Encounters:  07/15/23 218 lb (98.9 kg)  07/10/23 221 lb (100.2 kg)  06/17/23 224 lb (101.6 kg)     GEN: Well nourished, well developed in no acute distress NECK: No JVD; No carotid bruits CARDIAC: RRR, no murmurs, rubs, gallops RESPIRATORY:  Clear to auscultation without rales, wheezing or rhonchi  ABDOMEN: Soft, non-tender, non-distended, midline mild pulsatile mass in positive bruit EXTREMITIES:  No edema; No deformity   ASSESSMENT AND PLAN:    Atypical chest discomfort/Aortic atherosclerosis: She has had no new chest pain.  No further testing is indicated.  She does have known aortic atherosclerosis and we will practice aggressive risk reduction.   Paroxysmal atrial fibrillation: Noted to have history of PAF.  She has had no further paroxysms and she says she feels it when she is in it.   She has a Estate agent.  Her atrial fibs happened when she was going through treatment for her wound.  She had bleeding issues on DOAC.  At this point I think the better part of valor is not to start back and anticoagulate and when she has symptomatic atrial fibrillation and we talked about this strategy.   Lipoma removal: This has finally healed.  No change in therapy.  She has another lipoma on the other side of her back and she is not to get this treated.  Bradycardia:   We had a long discussion about this.  She does not have an indication for pacing.  No change in therapy.  Bruit: I will order an abdominal ultrasound to rule out aneurysm.  Dyslipidemia: Her LDL is slightly elevated 102.  She said she been eating a lot of eggs and so she is going to cut this out.  She can repeat a lipid  profile in about 3 months.  Hypertension:   Her blood pressure is controlled.  No change in therapy.   OSA: She is compliant with CPAP.   Follow up with me in 18 months.   Signed, Rollene Rotunda, MD

## 2023-07-15 ENCOUNTER — Ambulatory Visit: Payer: Medicare Other | Attending: Cardiology | Admitting: Cardiology

## 2023-07-15 ENCOUNTER — Other Ambulatory Visit (HOSPITAL_BASED_OUTPATIENT_CLINIC_OR_DEPARTMENT_OTHER): Payer: Self-pay

## 2023-07-15 ENCOUNTER — Encounter: Payer: Self-pay | Admitting: Cardiology

## 2023-07-15 VITALS — BP 122/76 | HR 56 | Ht 66.0 in | Wt 218.0 lb

## 2023-07-15 DIAGNOSIS — R109 Unspecified abdominal pain: Secondary | ICD-10-CM | POA: Diagnosis present

## 2023-07-15 DIAGNOSIS — R0789 Other chest pain: Secondary | ICD-10-CM

## 2023-07-15 DIAGNOSIS — M7989 Other specified soft tissue disorders: Secondary | ICD-10-CM

## 2023-07-15 DIAGNOSIS — R001 Bradycardia, unspecified: Secondary | ICD-10-CM

## 2023-07-15 DIAGNOSIS — I480 Paroxysmal atrial fibrillation: Secondary | ICD-10-CM

## 2023-07-15 NOTE — Patient Instructions (Signed)
 Medication Instructions:  No changes. *If you need a refill on your cardiac medications before your next appointment, please call your pharmacy*  Testing/Procedures: Your physician has requested that you have an abdominal aorta duplex. During this test, an ultrasound is used to evaluate the aorta. Allow 30 minutes for this exam. Do not eat after midnight the day before and avoid carbonated beverages.  Please note: We ask at that you not bring children with you during ultrasound (echo/ vascular) testing. Due to room size and safety concerns, children are not allowed in the ultrasound rooms during exams. Our front office staff cannot provide observation of children in our lobby area while testing is being conducted. An adult accompanying a patient to their appointment will only be allowed in the ultrasound room at the discretion of the ultrasound technician under special circumstances. We apologize for any inconvenience.    Follow-Up: At [facility], you and your health needs are our priority.  As part of our continuing mission to provide you with exceptional heart care, we have created designated Provider Care Teams.  These Care Teams include your primary Cardiologist (physician) and Advanced Practice Providers (APPs -  Physician Assistants and Nurse Practitioners) who all work together to provide you with the care you need, when you need it.  We recommend signing up for the patient portal called "MyChart".  Sign up information is provided on this After Visit Summary.  MyChart is used to connect with patients for Virtual Visits (Telemedicine).  Patients are able to view lab/test results, encounter notes, upcoming appointments, etc.  Non-urgent messages can be sent to your provider as well.   To learn more about what you can do with MyChart, go to [email or web address].    Your next appointment:   18  month(s)  Provider:   Rollene Rotunda, MD     Other Instructions

## 2023-07-16 ENCOUNTER — Other Ambulatory Visit: Payer: Self-pay | Admitting: Internal Medicine

## 2023-07-16 MED ORDER — AMLODIPINE BESYLATE 2.5 MG PO TABS: 2.5000 mg | ORAL_TABLET | Freq: Every day | ORAL | 3 refills | Status: DC

## 2023-07-16 NOTE — Telephone Encounter (Signed)
 Copied from CRM [id number]. Topic: Clinical - Medication Refill >> Jul 16, 2023 11:23 AM Orinda Kenner C wrote: Most Recent Primary Care Visit:  Provider: Hillard Danker A  Department: [facility]  Visit Type: PHYSICAL  Date: 07/10/2023  Medication: amLODipine (NORVASC) 2.5 MG tablet   Has the patient contacted their pharmacy? Yes, patient insurance advised medication has to be sent to [facility] pharmacy now. Please send medication to [facility] pharmacy.  (Agent: If no, request that the patient contact the pharmacy for the refill. If patient does not wish to contact the pharmacy document the reason why and proceed with request.) (Agent: If yes, when and what did the pharmacy advise?)  Is this the correct pharmacy for this prescription? Yes If no, delete pharmacy and type the correct one.  This is the patient's preferred pharmacy:   [facility] [place], [place] - [address] [place] [address] Phone: [phone] Fax: [phone]   Has the prescription been filled recently? No  Is the patient out of the medication? No  Has the patient been seen for an appointment in the last year OR does the patient have an upcoming appointment? Yes  Can we respond through MyChart? No, please call at [phone]  Agent: Please be advised that Rx refills may take up to 3 business days. We ask that you follow-up with your pharmacy.

## 2023-07-17 ENCOUNTER — Other Ambulatory Visit: Payer: Self-pay | Admitting: Internal Medicine

## 2023-07-17 ENCOUNTER — Other Ambulatory Visit (HOSPITAL_BASED_OUTPATIENT_CLINIC_OR_DEPARTMENT_OTHER): Payer: Self-pay

## 2023-07-17 DIAGNOSIS — Z1231 Encounter for screening mammogram for malignant neoplasm of breast: Secondary | ICD-10-CM

## 2023-07-28 NOTE — Progress Notes (Unsigned)
 07/28/2023 Shannon Navarro [id number] [birth year]  Referring provider: Myrlene Broker, * Primary GI doctor: Dr. Lavon Paganini  ASSESSMENT AND PLAN:   GERD with hiatal hernia 12/03/2020 endoscopy for dysphagia showed benign-appearing esophageal stenosis dilated, normal stomach, normal duodenum. Status post cholecystectomy 03/2022 and stone removal    AB bloating with constipation Check for celiac.  Will treat constipation with fiber and miralax Given FODMAP information and discussed diet, possible component of IBS If symptoms don't improve can consider SIBO test for methane with recent ABX use  History of colonic polyps 06/25/2020 colonoscopy 5 mm benign polyp descending colon diverticula sigmoid and descending colon nonbleeding internal hemorrhoids medium size recall 10 years Recall 2/20 32  History of Present Illness:  68 y.o. female  with a past medical history of GERD, hiatal hernia, status post dilatation, fatty liver, unremarkable stress test 2017, OSA on CPAP, A-fib follows with Dr. Antoine Poche, history of pseudotumor cerebri and others listed below, returns to clinic today for evaluation of abdominal bloating, epigastric pain, constiapation.  06/25/2020 endoscopy benign-appearing intrinsic moderate stenosis 34 to 35 cm dilated 18 mm mild hiatal hernia, otherwise unremarkable 06/25/2020 colonoscopy 5 mm benign polyp descending colon diverticula sigmoid and descending colon nonbleeding internal hemorrhoids medium size recall 10 years 09/14/20 office visit with Dr. Lavon Paganini for dysphagia with subsequent endoscopy 12/03/2020 endoscopy for dysphagia showed benign-appearing esophageal stenosis dilated, normal stomach, normal duodenum. AB [place] 02/2022 cholelithiasis unremarkable liver, follow-up HIDA decreased ejection fraction referred to surgery MRCP 04/09/2022 cholelithiasis suspicious for cholecystitis no choledocholithiasis 04/10/2022 status post cholecystectomy Dr.  Sheliah Hatch Status post ERCP 04/11/2022 with Dr. Chales Abrahams for positive IOP status post CBD stone removal  Patient presents for follow-up for reflux.  She  reports that she has never smoked. She has never been exposed to tobacco smoke. She has never used smokeless tobacco. She reports that she does not drink alcohol and does not use drugs. Her family history includes Aortic stenosis in her father; Asthma in her mother; Breast cancer in her cousin, maternal aunt, maternal aunt, and mother; Colon cancer in an other family member; Colon polyps in her father; Diabetes in her father; Esophageal cancer in an other family member; Heart disease in her father; Hypertension in her father and mother; Prostate cancer in her father; Stomach cancer in an other family member.   Current Medications:    Current Outpatient Medications (Cardiovascular):    amLODipine (NORVASC) 2.5 MG tablet, Take 1 tablet (2.5 mg total) by mouth daily.   metoprolol tartrate (LOPRESSOR) 25 MG tablet, Take 0.5 tablets (12.5 mg total) by mouth daily. (Patient taking differently: Take 12.5 mg by mouth daily as needed (palpitation/afib).)  Current Outpatient Medications (Respiratory):    fluticasone (FLONASE) 50 MCG/ACT nasal spray, Place 2 sprays into both nostrils daily.   loratadine (CLARITIN) 10 MG tablet, Take 10 mg by mouth in the morning.    Current Outpatient Medications (Other):    famotidine (PEPCID) 40 MG tablet, Take 40 mg by mouth daily.   Multiple Vitamin (MULTIVITAMIN PO), Take by mouth.   omeprazole (PRILOSEC OTC) 20 MG tablet, Take 20 mg by mouth daily before breakfast. (Patient not taking: Reported on 07/15/2023)   VITAMIN D PO, Take 1 tablet by mouth in the morning. Gummy  Surgical History:  She  has a past surgical history that includes Dilation and curettage of uterus (2010); Colonoscopy; Upper gastrointestinal endoscopy (11/2020); transthoracic echocardiogram (03/11/2016); Tubal ligation; colonscopy (2007);  Dilatation & curettage/hysteroscopy with myosure (N/A, 03/26/2017); Laparoscopic hysterectomy (Bilateral, 06/18/2017);  Breast biopsy (Right, 08/12/2019); Fracture surgery; Abdominal hysterectomy; Anterior cervical decompression/discectomy fusion 4 level (N/A, 10/24/2021); torn meniscus surgery on right knee ; Tonsillectomy (Bilateral, 01/10/2022); Cholecystectomy (N/A, 04/10/2022); ERCP (N/A, 04/11/2022); Sphincterotomy (04/11/2022); removal of stones (04/11/2022); biopsy (04/11/2022); Esophagogastroduodenoscopy (N/A, 04/11/2022); Balloon dilation (N/A, 04/11/2022); Mass excision (Left, 02/17/2023); Irrigation and debridement abscess (N/A, 03/19/2023); and Spine surgery.  Current Medications, Allergies, Past Medical History, Past Surgical History, Family History and Social History were reviewed in [employer] record.  Physical Exam: There were no vitals taken for this visit. General:   Pleasant, well developed female in no acute distress Heart : Regular rate and rhythm; no murmurs Pulm: Clear anteriorly; no wheezing Abdomen:  Soft, Obese AB, Active bowel sounds. mild tenderness in the epigastrium. Without guarding and Without rebound, No organomegaly appreciated. Rectal: Not evaluated Extremities:  without  edema. Neurologic:  Alert and  oriented x4;  No focal deficits.  Psych:  Cooperative. Normal mood and affect.   Doree Albee, PA-C 07/28/23

## 2023-07-29 ENCOUNTER — Ambulatory Visit: Payer: Medicare Other | Admitting: Physician Assistant

## 2023-07-29 ENCOUNTER — Ambulatory Visit
Admission: RE | Admit: 2023-07-29 | Discharge: 2023-07-29 | Disposition: A | Discharge status: Home / Self Care | Source: Ambulatory Visit | Attending: Physician Assistant | Admitting: Physician Assistant

## 2023-07-29 ENCOUNTER — Encounter: Payer: Self-pay | Admitting: Physician Assistant

## 2023-07-29 ENCOUNTER — Other Ambulatory Visit

## 2023-07-29 VITALS — BP 128/60 | HR 60 | Ht 66.0 in | Wt 216.4 lb

## 2023-07-29 DIAGNOSIS — K219 Gastro-esophageal reflux disease without esophagitis: Secondary | ICD-10-CM

## 2023-07-29 DIAGNOSIS — R7989 Other specified abnormal findings of blood chemistry: Secondary | ICD-10-CM

## 2023-07-29 DIAGNOSIS — R140 Abdominal distension (gaseous): Secondary | ICD-10-CM

## 2023-07-29 DIAGNOSIS — K5904 Chronic idiopathic constipation: Secondary | ICD-10-CM

## 2023-07-29 DIAGNOSIS — K449 Diaphragmatic hernia without obstruction or gangrene: Secondary | ICD-10-CM

## 2023-07-29 DIAGNOSIS — K5900 Constipation, unspecified: Secondary | ICD-10-CM | POA: Diagnosis not present

## 2023-07-29 DIAGNOSIS — Z860100 Personal history of colon polyps, unspecified: Secondary | ICD-10-CM

## 2023-07-29 DIAGNOSIS — R1013 Epigastric pain: Secondary | ICD-10-CM

## 2023-07-29 DIAGNOSIS — Z8719 Personal history of other diseases of the digestive system: Secondary | ICD-10-CM

## 2023-07-29 LAB — CBC WITH DIFFERENTIAL/PLATELET
Basophils Absolute: 0 10*3/uL (ref 0.0–0.1)
Basophils Relative: 0.8 % (ref 0.0–3.0)
Eosinophils Absolute: 0.2 10*3/uL (ref 0.0–0.7)
Eosinophils Relative: 3.7 % (ref 0.0–5.0)
HCT: 43.4 % (ref 36.0–46.0)
Hemoglobin: 14.4 g/dL (ref 12.0–15.0)
Lymphocytes Relative: 13.8 % (ref 12.0–46.0)
Lymphs Abs: 0.7 10*3/uL (ref 0.7–4.0)
MCHC: 33.2 g/dL (ref 30.0–36.0)
MCV: 84.8 fl (ref 78.0–100.0)
Monocytes Absolute: 0.6 10*3/uL (ref 0.1–1.0)
Monocytes Relative: 12.7 % — ABNORMAL HIGH (ref 3.0–12.0)
Neutro Abs: 3.3 10*3/uL (ref 1.4–7.7)
Neutrophils Relative %: 69 % (ref 43.0–77.0)
Platelets: 238 10*3/uL (ref 150.0–400.0)
RBC: 5.12 Mil/uL — ABNORMAL HIGH (ref 3.87–5.11)
RDW: 14.7 % (ref 11.5–15.5)
WBC: 4.7 10*3/uL (ref 4.0–10.5)

## 2023-07-29 LAB — BASIC METABOLIC PANEL
BUN: 9 mg/dL (ref 6–23)
CO2: 30 meq/L (ref 19–32)
Calcium: 9.4 mg/dL (ref 8.4–10.5)
Chloride: 104 meq/L (ref 96–112)
Creatinine, Ser: 0.67 mg/dL (ref 0.40–1.20)
GFR: 91.06 mL/min (ref >60.00)
Glucose, Bld: 91 mg/dL (ref 70–99)
Potassium: 3.6 meq/L (ref 3.5–5.1)
Sodium: 140 meq/L (ref 135–145)

## 2023-07-29 LAB — HEPATIC FUNCTION PANEL
ALT: 45 U/L — ABNORMAL HIGH (ref 0–35)
AST: 27 U/L (ref 0–37)
Albumin: 4 g/dL (ref 3.5–5.2)
Alkaline Phosphatase: 71 U/L (ref 39–117)
Bilirubin, Direct: 0.2 mg/dL (ref 0.0–0.3)
Total Bilirubin: 0.9 mg/dL (ref 0.2–1.2)
Total Protein: 7.1 g/dL (ref 6.0–8.3)

## 2023-07-29 NOTE — Patient Instructions (Addendum)
 Your provider has requested that you go to the basement level for lab work before leaving today. Press "B" on the elevator. The lab is located at the first door on the left as you exit the elevator.  Your provider has requested that you have an abdominal x ray before leaving today. Please go to the basement floor to our Radiology department for the test.  You have been given a testing kit to check for small intestine bacterial overgrowth (SIBO) which is completed by a company named [facility]. Make sure to return your test in the mail using the return mailing label given to you along with the kit. The test order, your demographic and insurance information have all already been sent to the company. [facility] will collect an upfront charge of $99.74 for commercial insurance plans and $209.74 if you are paying cash. Make sure to discuss with [facility] PRIOR to having the test to see if they have gotten information from your insurance company as to how much your testing will cost out of pocket, if any. Please contact [facility] at phone number [phone] to get instructions regarding how to perform the test as our office is unable to give specific testing instructions.  You have been scheduled for an appointment with Hyacinth Meeker PA-C on 10-09-23 at 1:50pm . Please arrive 10 minutes early for your appointment.   First do a trial off milk/lactose products if you use them.  Add fiber like benefiber or citracel once a day Increase activity Can do trial of FDgard Please try to decrease stress. consider talking with PCP about anti anxiety medication or try head space app for meditation. if any worsening symptoms like blood in stool, weight loss, please call the office   Get on prilosec 20 mg daily for 2-4 weeks    FODMAP stands for fermentable oligo-, di-, mono-saccharides and polyols (1). These are the scientific terms used to classify groups of carbs that are difficult for  our body to digest and that are notorious for triggering digestive symptoms like bloating, gas, loose stools and stomach pain.   You can try low FODMAP diet  - start with eliminating just one column at a time that you feel may be a trigger for you. - the table at the very bottom contains foods that are low in FODMAPs   Sometimes trying to eliminate the FODMAP's from your diet is difficult or tricky, if you are stuggling with trying to do the elimination diet you can try an enzyme.  There is a food enzymes that you sprinkle in or on your food that helps break down the FODMAP. You can read more about the enzyme by going to this site: https://fodzyme.com/    Small intestinal bacterial overgrowth (SIBO) occurs when there is an abnormal increase in the overall bacterial population in the small intestine -- particularly types of bacteria not commonly found in that part of the digestive tract. Small intestinal bacterial overgrowth (SIBO) commonly results when a circumstance -- such as surgery or disease -- slows the passage of food and waste products in the digestive tract, creating a breeding ground for bacteria.  Signs and symptoms of SIBO often include: Loss of appetite Abdominal pain Nausea Bloating An uncomfortable feeling of fullness after eating Diarrhea or constipation, depending on the type of gas produced  What foods trigger SIBO? While foods aren't the original cause of SIBO, certain foods do encourage the overgrowth of the wrong bacteria in your small intestine. If you're feeding them their  favorite foods, they're going to grow more, and that will trigger more of your SIBO symptoms. By the same token, you can help reduce the overgrowth by starving the problematic bacteria of their favorite foods. This strategy has led to a number of proposed SIBO eating plans. The plans vary, and so do individual results. But in general, they tend to recommend limiting carbohydrates.  These  include: Sugars and sweeteners. Fruits and starchy vegetables. Dairy products. Grains.  There is a test for this we can do called a breath test, if you are positive we will treat you with an antibiotic to see if it helps.  Your symptoms are very suspicious for this condition, as discussed, we will start you on an antibiotic to see if this helps.

## 2023-08-03 ENCOUNTER — Ambulatory Visit (HOSPITAL_COMMUNITY)
Admission: RE | Admit: 2023-08-03 | Discharge: 2023-08-03 | Disposition: A | Discharge status: Home / Self Care | Source: Ambulatory Visit | Attending: Physician Assistant | Admitting: Physician Assistant

## 2023-08-03 DIAGNOSIS — R7989 Other specified abnormal findings of blood chemistry: Secondary | ICD-10-CM | POA: Insufficient documentation

## 2023-08-05 ENCOUNTER — Telehealth: Payer: Self-pay | Admitting: Physician Assistant

## 2023-08-05 NOTE — Telephone Encounter (Signed)
 LVM and my chart message

## 2023-08-05 NOTE — Telephone Encounter (Signed)
 Patient is returning your call.

## 2023-08-05 NOTE — Telephone Encounter (Signed)
 PT received a SIBO kit. She recentlyt completed an antibiotic regimen and has to wait 14 days to do the test and would like to know if she needs to return the kit to lab until she's ready to do it. Please advise.

## 2023-08-05 NOTE — Telephone Encounter (Signed)
 Patient made aware to hold onto the kit for now. She said she may not do it because it would cost her 220 dollars but I told her that if she decides to do it just look at the expiration date before doing and she voiced understanding

## 2023-08-16 ENCOUNTER — Other Ambulatory Visit: Payer: Self-pay | Admitting: Internal Medicine

## 2023-08-18 ENCOUNTER — Ambulatory Visit (HOSPITAL_COMMUNITY)
Admission: RE | Admit: 2023-08-18 | Discharge: 2023-08-18 | Disposition: A | Discharge status: Home / Self Care | Source: Ambulatory Visit | Attending: Cardiology | Admitting: Cardiology

## 2023-08-18 DIAGNOSIS — I700 Atherosclerosis of aorta: Secondary | ICD-10-CM | POA: Diagnosis present

## 2023-08-18 DIAGNOSIS — R109 Unspecified abdominal pain: Secondary | ICD-10-CM | POA: Diagnosis present

## 2023-08-19 ENCOUNTER — Emergency Department (HOSPITAL_COMMUNITY): Admission: EM | Admit: 2023-08-19 | Discharge: 2023-08-19 | Disposition: A | Discharge status: Home / Self Care

## 2023-08-19 ENCOUNTER — Emergency Department (HOSPITAL_COMMUNITY)

## 2023-08-19 ENCOUNTER — Other Ambulatory Visit: Payer: Self-pay

## 2023-08-19 DIAGNOSIS — R002 Palpitations: Secondary | ICD-10-CM | POA: Insufficient documentation

## 2023-08-19 LAB — CBC WITH DIFFERENTIAL/PLATELET
Abs Immature Granulocytes: 0.02 10*3/uL (ref 0.00–0.07)
Basophils Absolute: 0.1 10*3/uL (ref 0.0–0.1)
Basophils Relative: 1 %
Eosinophils Absolute: 0.2 10*3/uL (ref 0.0–0.5)
Eosinophils Relative: 3 %
HCT: 46.2 % — ABNORMAL HIGH (ref 36.0–46.0)
Hemoglobin: 15.1 g/dL — ABNORMAL HIGH (ref 12.0–15.0)
Immature Granulocytes: 0 %
Lymphocytes Relative: 28 %
Lymphs Abs: 1.9 10*3/uL (ref 0.7–4.0)
MCH: 27.8 pg (ref 26.0–34.0)
MCHC: 32.7 g/dL (ref 30.0–36.0)
MCV: 84.9 fL (ref 80.0–100.0)
Monocytes Absolute: 0.6 10*3/uL (ref 0.1–1.0)
Monocytes Relative: 9 %
Neutro Abs: 4.1 10*3/uL (ref 1.7–7.7)
Neutrophils Relative %: 59 %
Platelets: 280 10*3/uL (ref 150–400)
RBC: 5.44 MIL/uL — ABNORMAL HIGH (ref 3.87–5.11)
RDW: 14.1 % (ref 11.5–15.5)
WBC: 6.9 10*3/uL (ref 4.0–10.5)
nRBC: 0 % (ref 0.0–0.2)

## 2023-08-19 LAB — COMPREHENSIVE METABOLIC PANEL WITH GFR
ALT: 22 U/L (ref 0–44)
AST: 22 U/L (ref 15–41)
Albumin: 3.8 g/dL (ref 3.5–5.0)
Alkaline Phosphatase: 62 U/L (ref 38–126)
Anion gap: 10 (ref 5–15)
BUN: 10 mg/dL (ref 8–23)
CO2: 27 mmol/L (ref 22–32)
Calcium: 9.5 mg/dL (ref 8.9–10.3)
Chloride: 105 mmol/L (ref 98–111)
Creatinine, Ser: 0.75 mg/dL (ref 0.44–1.00)
GFR, Estimated: >60 mL/min (ref >60)
Glucose, Bld: 107 mg/dL — ABNORMAL HIGH (ref 70–99)
Potassium: 3.8 mmol/L (ref 3.5–5.1)
Sodium: 142 mmol/L (ref 135–145)
Total Bilirubin: 1.3 mg/dL — ABNORMAL HIGH (ref 0.0–1.2)
Total Protein: 7.4 g/dL (ref 6.5–8.1)

## 2023-08-19 LAB — TROPONIN I (HIGH SENSITIVITY): Troponin I (High Sensitivity): 6 ng/L (ref <18)

## 2023-08-19 NOTE — ED Notes (Signed)
 EKG print in triage.

## 2023-08-19 NOTE — Discharge Instructions (Signed)
 Your workup today was reassuring.  It does sound like you went back into atrial fibrillation but did eventually respond to the metoprolol.  Please call your cardiologist tomorrow to see if they would like to make any changes to your medications.  Return to the ER for worsening symptoms.

## 2023-08-19 NOTE — ED Provider Notes (Signed)
 [place] EMERGENCY DEPARTMENT AT [facility] Provider Note   CSN: [id number] Arrival date & time: 08/19/23  1920     History  Chief Complaint  Patient presents with   Palpitations Shannon Navarro    Shannon Navarro is a 68 y.o. female.  68 year old female with past medical history of hypertension and atrial fibrillation in the past presenting to the emergency department today with palpitations.  The patient states that 2 to 3 hours prior to arrival she started to develop some palpitations.  She states that her heart rate on her smart watch was in the 140s.  She states that she has had paroxysmal atrial fibrillation in the past.  She did take a metoprolol.  After an hour and a half her heart rate had not improved so she came to the ER for further evaluation.  She denies any associated chest pain.  Denies any leg pain or swelling.  Denies a history of DVT or pulmonary embolism, recent surgeries, recent travel.  She states that while she has been here that she feels like her heart is going back into a normal rhythm.  She is not on any blood thinners currently.        Home Medications Prior to Admission medications   Medication Sig Start Date End Date Taking? Authorizing Provider  amLODipine (NORVASC) 2.5 MG tablet Take 1 tablet (2.5 mg total) by mouth daily. 07/16/23   Myrlene Broker, MD  famotidine (PEPCID) 40 MG tablet Take 40 mg by mouth as needed.    [provider]  fluticasone (FLONASE) 50 MCG/ACT nasal spray Place 2 sprays into both nostrils daily. Patient taking differently: Place 2 sprays into both nostrils as needed. 07/10/23   Myrlene Broker, MD  loratadine (CLARITIN) 10 MG tablet Take 10 mg by mouth in the morning.    [provider]  metoprolol tartrate (LOPRESSOR) 25 MG tablet Take 0.5 tablets (12.5 mg total) by mouth daily. Patient not taking: Reported on 07/29/2023 12/29/22   Rollene Rotunda, MD  Multiple Vitamin (MULTIVITAMIN PO) Take by  mouth.    [provider]  omeprazole (PRILOSEC OTC) 20 MG tablet Take 20 mg by mouth as needed.    [provider]  VITAMIN D PO Take 1 tablet by mouth in the morning.    [provider]      Allergies    Cefaclor, Cipro [ciprofloxacin hcl], Codeine, Hydrocodone, Levaquin [levofloxacin], Nexium [esomeprazole magnesium], Penicillins, and Sulfa antibiotics    Review of Systems   Review of Systems  Cardiovascular:  Positive for palpitations.  All other systems reviewed and are negative.   Physical Exam Updated Vital Signs BP 139/70   Pulse (!) 50   Temp 98.2 F (36.8 C) (Oral)   Resp 17   SpO2 97%  Physical Exam Vitals and nursing note reviewed.   Gen: NAD Eyes: PERRL, EOMI HEENT: no oropharyngeal swelling Neck: trachea midline Resp: clear to auscultation bilaterally Card: RRR, no murmurs, rubs, or gallops Abd: nontender, nondistended Extremities: no calf tenderness, no edema Vascular: 2+ radial pulses bilaterally, 2+ DP pulses bilaterally Skin: no rashes Psyc: acting appropriately   ED Results / Procedures / Treatments   Labs (all labs ordered are listed, but only abnormal results are displayed) Labs Reviewed  CBC WITH DIFFERENTIAL/PLATELET - Abnormal; Notable for the following components:      Result Value   RBC 5.44 (*)    Hemoglobin 15.1 (*)    HCT 46.2 (*)  All other components within normal limits  COMPREHENSIVE METABOLIC PANEL WITH GFR - Abnormal; Notable for the following components:   Glucose, Bld 107 (*)    Total Bilirubin 1.3 (*)    All other components within normal limits  TROPONIN I (HIGH SENSITIVITY)    EKG EKG Interpretation Date/Time:  Wednesday August 19 2023 21:02:11 EDT Ventricular Rate:  61 PR Interval:  144 QRS Duration:  80 QT Interval:  400 QTC Calculation: 402 R Axis:   7  Text Interpretation: Normal sinus rhythm Cannot rule out Anterior infarct , age undetermined Abnormal ECG When compared with ECG of  19-Aug-2023 20:10, PREVIOUS ECG IS PRESENT Confirmed by Beckey Downing [phone]) on 08/19/2023 9:50:24 PM  Radiology DG Chest 2 View Result Date: 08/19/2023 CLINICAL DATA:  palpitations EXAM: CHEST - 2 VIEW COMPARISON:  Chest x-ray 09/23/2022 FINDINGS: The heart and mediastinal contours are within normal limits. No focal consolidation. No pulmonary edema. No pleural effusion. No pneumothorax. No acute osseous abnormality. IMPRESSION: No active cardiopulmonary disease. Electronically Signed   By: Tish Frederickson M.D.   On: 08/19/2023 22:39   VAS [place] AAA DUPLEX Result Date: 08/18/2023 ABDOMINAL AORTA STUDY Patient Name:  Shannon Navarro  Date of Exam:   08/18/2023 Medical Rec #: [id number]          Accession #:    [id number] Date of Birth: [birth year]          Patient Gender: F Patient Age:   48 years Exam Location:  [facility] Procedure:      VAS [place] AAA DUPLEX Referring Phys: Rollene Rotunda --------------------------------------------------------------------------------  Indications: Abdominal pain. Risk Factors: Hypertension, no history of smoking. Limitations: Air/bowel gas and obesity.  Comparison Study: NA Performing Technologist: Tyna Jaksch RVT  Examination Guidelines: A complete evaluation includes B-mode imaging, spectral Doppler, color Doppler, and power Doppler as needed of all accessible portions of each vessel. Bilateral testing is considered an integral part of a complete examination. Limited examinations for reoccurring indications may be performed as noted.  Abdominal Aorta Findings: +-----------+-------+----------+----------+---------+--------+--------+ Location   AP (cm)Trans (cm)PSV (cm/s)Waveform ThrombusComments +-----------+-------+----------+----------+---------+--------+--------+ Proximal   2.10   2.10      122       triphasic                 +-----------+-------+----------+----------+---------+--------+--------+ Mid        1.80   1.80      100       triphasic                  +-----------+-------+----------+----------+---------+--------+--------+ Distal     1.70   1.70      93        triphasic                 +-----------+-------+----------+----------+---------+--------+--------+ RT CIA Prox1.2    1.2       131       triphasic                 +-----------+-------+----------+----------+---------+--------+--------+ RT EIA Prox0.8    0.8       286       biphasic                  +-----------+-------+----------+----------+---------+--------+--------+ LT CIA Prox1.1    1.1       148       triphasic                 +-----------+-------+----------+----------+---------+--------+--------+ LT EIA Prox0.9    0.9  336       triphasic                 +-----------+-------+----------+----------+---------+--------+--------+ IVC/Iliac Findings: +--------+------+--------+--------+   IVC   PatentThrombusComments +--------+------+--------+--------+ IVC Proxpatent                 +--------+------+--------+--------+    Summary: Abdominal Aorta: No evidence of an abdominal aortic aneurysm was visualized. The largest aortic measurement is 2.1 cm. No previous exam available for comparison. Stenosis: +--------------------+-------------+----------------+ Location            Stenosis     Comments         +--------------------+-------------+----------------+ Right External Iliac>50% stenosisproximal segment +--------------------+-------------+----------------+ Left External Iliac >50% stenosisproximal segment +--------------------+-------------+----------------+  IVC/Iliac: Patent IVC.  *See table(s) above for measurements and observations.   Preliminary     Procedures Procedures    Medications Ordered in ED Medications - No data to display  ED Course/ Medical Decision Making/ A&P                                 Medical Decision Making 68 year old female with past medical history of atrial fibrillation and hypertension presenting to  the emergency department today with palpitations and tachycardia.  Her EKG interpreted by me shows she is back in a sinus rhythm.  Her labs including her troponin are negative.  She is back to her baseline currently and is in a sinus rhythm on the monitor.  Her chest x-ray interpreted by me shows no acute infiltrates, no pulmonary edema, no pneumothorax.  She will be discharged with return precautions.  Amount and/or Complexity of Data Reviewed Labs: ordered. Radiology: ordered.           Final Clinical Impression(s) / ED Diagnoses Final diagnoses:  Palpitations    Rx / DC Orders ED Discharge Orders     None         Durwin Glaze, MD 08/19/23 2305

## 2023-08-19 NOTE — ED Triage Notes (Addendum)
 Patient reports intermittent palpitations this afternoon with mild SOB , denies chest pain , she took 2 tabs ASA 81 mg / 1/2 tab of Metropolol 25 mg prior to arrival .

## 2023-08-20 ENCOUNTER — Ambulatory Visit
Admission: RE | Admit: 2023-08-20 | Discharge: 2023-08-20 | Discharge status: Home / Self Care | Source: Ambulatory Visit | Attending: Internal Medicine | Admitting: Internal Medicine

## 2023-08-20 DIAGNOSIS — Z1231 Encounter for screening mammogram for malignant neoplasm of breast: Secondary | ICD-10-CM

## 2023-08-25 ENCOUNTER — Encounter: Payer: Self-pay | Admitting: Internal Medicine

## 2023-08-26 ENCOUNTER — Encounter: Payer: Self-pay | Admitting: Internal Medicine

## 2023-08-26 LAB — HM MAMMOGRAPHY

## 2023-08-31 ENCOUNTER — Telehealth: Payer: Self-pay | Admitting: Cardiology

## 2023-08-31 ENCOUNTER — Other Ambulatory Visit: Payer: Self-pay | Admitting: *Deleted

## 2023-08-31 DIAGNOSIS — I700 Atherosclerosis of aorta: Secondary | ICD-10-CM

## 2023-08-31 NOTE — Telephone Encounter (Signed)
 Patient would like to have orders placed for lipid panel in June. She says during her last visit Dr. Lavonne Prairie recommended a 3 month cholesterol check. Please advise.

## 2023-08-31 NOTE — Telephone Encounter (Signed)
 Called and spoke to patient and made aware that per Dr. Lavonne Prairie lipid panel to be done in 3 months. Order placed and patient made aware of the new location. Understanding verbalized

## 2023-10-09 ENCOUNTER — Encounter: Payer: Self-pay | Admitting: Physician Assistant

## 2023-10-09 ENCOUNTER — Ambulatory Visit: Admitting: Physician Assistant

## 2023-10-09 ENCOUNTER — Other Ambulatory Visit (HOSPITAL_BASED_OUTPATIENT_CLINIC_OR_DEPARTMENT_OTHER): Payer: Self-pay

## 2023-10-09 VITALS — BP 110/60 | HR 45 | Ht 65.0 in | Wt 215.0 lb

## 2023-10-09 DIAGNOSIS — R140 Abdominal distension (gaseous): Secondary | ICD-10-CM | POA: Diagnosis not present

## 2023-10-09 DIAGNOSIS — K5900 Constipation, unspecified: Secondary | ICD-10-CM | POA: Diagnosis not present

## 2023-10-09 DIAGNOSIS — K219 Gastro-esophageal reflux disease without esophagitis: Secondary | ICD-10-CM | POA: Diagnosis not present

## 2023-10-09 MED ORDER — OMEPRAZOLE 20 MG PO CPDR
20.0000 mg | DELAYED_RELEASE_CAPSULE | Freq: Every day | ORAL | 3 refills | Status: AC
  Filled 2023-10-09: qty 90, 90d supply, fill #0

## 2023-10-09 NOTE — Progress Notes (Signed)
 Chief Complaint: Follow-up GERD  HPI:    Shannon Navarro is a 68 year old female, known to Dr. Leonia Raman, with a past medical history as listed below, who was referred to me by Adelia Homestead, * for follow-up of GERD.    06/25/2020 colonoscopy with a 5 mm benign polyp in the descending colon, diverticula in the sigmoid and descending colon nonbleeding internal hemorrhoids.  Recall recommended 10 years.    12/03/2020 EGD for dysphagia with benign-appearing esophageal stenosis dilated and normal stomach and duodenum.    12/23 ERCP and status post cholecystectomy 11/23 with stone removal.    07/29/2023 patient seen in clinic and discussed reflux with a hiatal hernia at times started Prilosec daily for 2 to 4 weeks.  If not improving discussed repeat EGD.  Also discussed abdominal bloating with constipation, negative celiac, on MiraLAX  as needed.  Given FODMAP information and ordering x-ray.  Recommended possible SIBO test.    07/29/2023 x-ray showed large volume stool burden.  Recommended MiraLAX  every day 1-2 times daily consider Linzess.    Today, the patient tells me that she is doing very well.  She really has no complaints or concerns.  She remains on Omeprazole 20 mg daily but she thinks she may not even need this so she is going to try to taper down to every other day.  She did follow-up with her CPAP doctor and they changed her from a setting of 10 down to a 5 and this is help with the bloating and other feeling she was having.  She is taking Benefiber every other day and increase fiber in her diet and is only had to use MiraLAX  as needed.  She is very happy.    Denies fever, chills or weight loss.  Past Medical History:  Diagnosis Date   Allergic rhinitis    Allergy    Anticoagulant long-term use    XARELTO  no longer taking   Bile duct stone    Dysrhythmia    hx of atrial fib x 2 per pt   Endometrial polyp    Endometrial polyp    Fatty liver 06/23/2007   GERD    Herniated disc,  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   History of esophageal stricture    mainly due to gerd   History of exercise stress test 06/13/2015   PER DR HOCHREIN NOTE -- NO EVIDENCE ISCHEMIA   HYPERTENSION    pt denies   Impaired fasting glucose    Migraine    hx of migraines none recent   OSA on CPAP followed by dr Matilde Son   per study 02-09-2012  severe osa w/ AHI 33.8   Paroxysmal atrial fibrillation (HCC) cardioloigst-  dr hochrein   dx 02-21-2016 at ED   Pneumonia    with COVID, double, enlarged heart   PONV (postoperative nausea and vomiting)    after surgery in 10/2021- had nausea after surgery related to preop drink   Pre-diabetes    Pseudotumor cerebri 2007   S/P cervical spinal fusion 10/24/2021   Sleep apnea    Thyroid  nodule    Vitamin D  deficiency 07/2013    Past Surgical History:  Procedure Laterality Date   ABDOMINAL HYSTERECTOMY     ANTERIOR CERVICAL DECOMPRESSION/DISCECTOMY FUSION 4 LEVELS N/A 10/24/2021   Procedure: ANTERIOR CERVICAL DECOMPRESSION FUSION CERVICAL FOUR THROUGH CERVICAL FIVE, CERVICAL FIVE THROUGH CERVICAL SIX, CERVICAL SIX THROUGH CERVICAL SEVEN WITH INSTRUMENTATION AND ALLOGRAFT;  Surgeon: Virl Grimes, MD;  Location: [facility];  Service: Orthopedics;  Laterality: N/A;  BALLOON DILATION N/A 04/11/2022   Procedure: BALLOON DILATION;  Surgeon: Lajuan Pila, MD;  Location: [facility];  Service: Gastroenterology;  Laterality: N/A;   BIOPSY  04/11/2022   Procedure: BIOPSY;  Surgeon: Lajuan Pila, MD;  Location: [facility];  Service: Gastroenterology;;   BREAST BIOPSY Right 08/12/2019    FIBROADENOMA    CHOLECYSTECTOMY N/A 04/10/2022   Procedure: LAPAROSCOPIC CHOLECYSTECTOMY WITH INTRAOPERATIVE CHOLANGIOGRAM;  Surgeon: Derral Flick, MD;  Location: [facility];  Service: General;  Laterality: N/A;   COLONOSCOPY     colonscopy  2007   normal   DILATATION & CURETTAGE/HYSTEROSCOPY WITH MYOSURE N/A 03/26/2017   Procedure: DILATATION & CURETTAGE/HYSTEROSCOPY WITH  MYOSURE;  Surgeon: Rogene Claude, MD;  Location: [facility];  Service: Gynecology;  Laterality: N/A;   DILATION AND CURETTAGE OF UTERUS  2010   ERCP N/A 04/11/2022   Procedure: ENDOSCOPIC RETROGRADE CHOLANGIOPANCREATOGRAPHY (ERCP);  Surgeon: Lajuan Pila, MD;  Location: [facility];  Service: Gastroenterology;  Laterality: N/A;   ESOPHAGOGASTRODUODENOSCOPY N/A 04/11/2022   Procedure: ESOPHAGOGASTRODUODENOSCOPY (EGD);  Surgeon: Lajuan Pila, MD;  Location: [facility];  Service: Gastroenterology;  Laterality: N/A;   FRACTURE SURGERY     IRRIGATION AND DEBRIDEMENT ABSCESS N/A 03/19/2023   Procedure: WOUND EXPLORATION AND DRAIN PLACEMENT;  Surgeon: Oralee Billow, MD;  Location: [facility];  Service: General;  Laterality: N/A;   LAPAROSCOPIC HYSTERECTOMY Bilateral 06/18/2017   Procedure: HYSTERECTOMY TOTAL LAPAROSCOPIC;  Surgeon: Rogene Claude, MD;  Location: [facility] ;  Service: Gynecology;  Laterality: Bilateral;  OUT PT IN BED, needs extra time MD request 2.5hrs in OR   MASS EXCISION Left 02/17/2023   Procedure: EXCISION SOFT TISSUE MASS LEFT BACK;  Surgeon: Oralee Billow, MD;  Location: [facility];  Service: General;  Laterality: Left;   REMOVAL OF STONES  04/11/2022   Procedure: REMOVAL OF STONES;  Surgeon: Lajuan Pila, MD;  Location: [facility];  Service: Gastroenterology;;   [address]  04/11/2022   Procedure: [address];  Surgeon: Lajuan Pila, MD;  Location: [facility];  Service: Gastroenterology;;   SPINE SURGERY     TONSILLECTOMY Bilateral 01/10/2022   Procedure: TONSILLECTOMY;  Surgeon: Janita Mellow, MD;  Location[facility];  Service: ENT;  Laterality: Bilateral;   torn meniscus surgery on right knee      TRANSTHORACIC ECHOCARDIOGRAM  03/11/2016   ef 60-65%/  mild MR/ trivial PR and TR   TUBAL LIGATION     UPPER GASTROINTESTINAL ENDOSCOPY  11/2020    Current Outpatient Medications  Medication Sig Dispense Refill   amLODipine   (NORVASC ) 2.5 MG tablet Take 1 tablet (2.5 mg total) by mouth daily. 90 tablet 3   famotidine  (PEPCID ) 40 MG tablet Take 40 mg by mouth as needed.     fluticasone  (FLONASE ) 50 MCG/ACT nasal spray Place 2 sprays into both nostrils daily. (Patient taking differently: Place 2 sprays into both nostrils as needed.) 48 g 6   loratadine  (CLARITIN ) 10 MG tablet Take 10 mg by mouth in the morning.     metoprolol  tartrate (LOPRESSOR ) 25 MG tablet Take 0.5 tablets (12.5 mg total) by mouth daily. (Patient not taking: Reported on 07/29/2023) 30 tablet 11   Multiple Vitamin (MULTIVITAMIN PO) Take by mouth.     omeprazole (PRILOSEC OTC) 20 MG tablet Take 20 mg by mouth as needed.     VITAMIN D  PO Take 1 tablet by mouth in the morning.     No current facility-administered medications for this visit.    Allergies as of 10/09/2023 - Review  Complete 07/29/2023  Allergen Reaction Noted   Cefaclor Rash and Other (See Comments) 09/27/2008   Cipro  [ciprofloxacin  hcl] Rash 04/21/2022   Codeine Nausea And Vomiting 09/27/2008   Hydrocodone  Nausea And Vomiting    Levaquin [levofloxacin] Nausea Only 09/27/2008   Nexium [esomeprazole magnesium] Other (See Comments) 11/23/2012   Penicillins Rash 09/27/2008   Sulfa antibiotics Rash 03/20/2017    Family History  Problem Relation Age of Onset   Breast cancer Mother        in 34's   Asthma Mother    Hypertension Mother    Prostate cancer Father    Aortic stenosis Father    Hypertension Father    Colon polyps Father    Heart disease Father    Diabetes Father    Breast cancer Maternal Aunt    Breast cancer Maternal Aunt    Breast cancer Cousin    Esophageal cancer Other    Stomach cancer Other    Colon cancer Other    Rectal cancer Neg Hx     Social History   Socioeconomic History   Marital status: Married    Spouse name: Not on file   Number of children: 2   Years of education: Not on file   Highest education level: Not on file  Occupational History    Occupation: Retired from [employer]: [employer]    Comment: from [employer], 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 Married, lives with husband. Has 2 daughter 43 - 73   Social Drivers of Corporate investment banker Strain: Low Risk  (07/09/2023)   Overall Financial Resource Strain (CARDIA)    Difficulty of Paying Living Expenses: Not hard at all  Food Insecurity: No Food Insecurity (07/09/2023)   Hunger Vital Sign    Worried About Running Out of Food in the Last Year: Never true    Ran Out of Food in the Last Year: Never true  Transportation Needs: No Transportation Needs (07/09/2023)   PRAPARE - Administrator, Civil Service (Medical): No    Lack of Transportation (Non-Medical): No  Physical Activity: Insufficiently Active (07/09/2023)   Exercise Vital Sign    Days of Exercise per Week: 2 days    Minutes of Exercise per Session: 20 min  Stress: No Stress Concern Present (07/09/2023)   [employer] of Occupational Health - Occupational Stress Questionnaire    Feeling of Stress : Not at all  Social Connections: Unknown (07/09/2023)   Social Connection and Isolation Panel [NHANES]    Frequency of Communication with Friends and Family: More than three times a week    Frequency of Social Gatherings with Friends and Family: Twice a week    Attends Religious Services: Patient declined    Database administrator or Organizations: Patient declined    Attends Banker Meetings: Not on file    Marital Status: Married  Intimate Partner Violence: Not  At Risk (02/17/2023)   Humiliation,  Afraid, Rape, and Kick questionnaire    Fear of Current or Ex-Partner: No    Emotionally Abused: No    Physically Abused: No    Sexually Abused: No    Review of Systems:    Constitutional: No weight loss, fever or chills Cardiovascular: No chest pain Respiratory: No SOB  Gastrointestinal: See HPI and otherwise negative   Physical Exam:  Vital signs: BP 110/60 (BP Location: Left Arm, Patient Position: Sitting, Cuff Size: Large)   Pulse (!) 45   Ht 5\' 5"  (1.651 m)   Wt 215 lb (97.5 kg)   BMI 35.78 kg/m    Constitutional:   Pleasant overweight Caucasian female appears to be in NAD, Well developed, Well nourished, alert and cooperative Respiratory: Respirations even and unlabored. Lungs clear to auscultation bilaterally.   No wheezes, crackles, or rhonchi.  Cardiovascular: Normal S1, S2. No MRG. Regular rate and rhythm. No peripheral edema, cyanosis or pallor.  Gastrointestinal:  Soft, nondistended, nontender. No rebound or guarding. Normal bowel sounds. No appreciable masses or hepatomegaly. Rectal:  Not performed.  Psychiatric: Oriented to person, place and time. Demonstrates good judgement and reason without abnormal affect or behaviors.  RELEVANT LABS AND IMAGING: CBC    Component Value Date/Time   WBC 6.9 08/19/2023 2032   RBC 5.44 (H) 08/19/2023 2032   HGB 15.1 (H) 08/19/2023 2032   HCT 46.2 (H) 08/19/2023 2032   PLT 280 08/19/2023 2032   MCV 84.9 08/19/2023 2032   MCH 27.8 08/19/2023 2032   MCHC 32.7 08/19/2023 2032   RDW 14.1 08/19/2023 2032   LYMPHSABS 1.9 08/19/2023 2032   MONOABS 0.6 08/19/2023 2032   EOSABS 0.2 08/19/2023 2032   BASOSABS 0.1 08/19/2023 2032    CMP     Component Value Date/Time   NA 142 08/19/2023 2032   K 3.8 08/19/2023 2032   CL 105 08/19/2023 2032   CO2 27 08/19/2023 2032   GLUCOSE 107 (H) 08/19/2023 2032   BUN 10 08/19/2023 2032   CREATININE 0.75 08/19/2023 2032   CALCIUM  9.5 08/19/2023 2032    PROT 7.4 08/19/2023 2032   ALBUMIN 3.8 08/19/2023 2032   AST 22 08/19/2023 2032   ALT 22 08/19/2023 2032   ALKPHOS 62 08/19/2023 2032   BILITOT 1.3 (H) 08/19/2023 2032   GFRNONAA >60 08/19/2023 2032   GFRAA >60 06/12/2019 0324    Assessment: 1.  Constipation: Better with a fiber supplement every other day and as needed MiraLAX  2.  Bloating: Better now that her CPAP setting is changed from a 10 down to a 5 and she has had some weight loss 3.  GERD: Controlled on Omeprazole 20 mg daily  Plan: 1.  Refill Omeprazole 20 mg daily #90 with 3 refills.  Patient can have another years worth of refills if needed. 2.  Patient to follow in clinic with us  as needed.  Reginal Capra, PA-C [facility] 10/09/2023, 1:52 PM  Cc: Adelia Homestead, *

## 2023-10-09 NOTE — Patient Instructions (Addendum)
 _______________________________________________________  If your blood pressure at your visit was 140/90 or greater, please contact your primary care physician to follow up on this.  _______________________________________________________  If you are age 67 or older, your body mass index should be between 23-30. Your Body mass index is 35.78 kg/m. If this is out of the aforementioned range listed, please consider follow up with your Primary Care Provider.  If you are age 72 or younger, your body mass index should be between 19-25. Your Body mass index is 35.78 kg/m. If this is out of the aformentioned range listed, please consider follow up with your Primary Care Provider.   ________________________________________________________  The [facility] providers would like to encourage you to use MYCHART to communicate with providers for non-urgent requests or questions.  Due to long hold times on the telephone, sending your provider a message by [facility] may be a faster and more efficient way to get a response.  Please allow 48 business hours for a response.  Please remember that this is for non-urgent requests.  _______________________________________________________  We have sent the following medications to your pharmacy for you to pick up at your convenience: Omeprazole  Please follow up in 2 years . Give us  a call at [phone] to schedule an appointment.  It was a pleasure to see you today!  Thank you for trusting me with your gastrointestinal care!

## 2023-12-04 ENCOUNTER — Other Ambulatory Visit: Payer: Self-pay

## 2023-12-04 DIAGNOSIS — I700 Atherosclerosis of aorta: Secondary | ICD-10-CM

## 2023-12-04 LAB — LIPID PANEL

## 2023-12-05 LAB — LIPID PANEL
Chol/HDL Ratio: 3.4 ratio (ref 0.0–4.4)
Cholesterol, Total: 168 mg/dL (ref 100–199)
HDL: 50 mg/dL (ref >39)
LDL Chol Calc (NIH): 106 mg/dL — ABNORMAL HIGH (ref 0–99)
Triglycerides: 63 mg/dL (ref 0–149)
VLDL Cholesterol Cal: 12 mg/dL (ref 5–40)

## 2023-12-06 ENCOUNTER — Ambulatory Visit: Payer: Self-pay | Admitting: Cardiology

## 2023-12-06 DIAGNOSIS — E785 Hyperlipidemia, unspecified: Secondary | ICD-10-CM

## 2023-12-08 NOTE — Telephone Encounter (Signed)
 Pt returning call

## 2023-12-10 NOTE — Telephone Encounter (Signed)
 Spoke with pt regarding her results. Pt would like to hold off on starting medication. Pt would like to continue eating well and exercising to try and lower her LDL. Pt would like to still have labs drawn in 3 months to see if she has made progress. Lipid panel ordered for 3 months out. Pt verbalized understanding. All questions if any were answered.

## 2023-12-10 NOTE — Telephone Encounter (Signed)
-----   Message from Lynwood Schilling sent at 12/06/2023  8:35 AM EDT ----- LDL was still above 100.  Not at target.  She needs mild lipid lower and I would suggest 40 mg of Pravachol and repeat a lipid profile in 3 months.  Disp number 90 with 3 refills.  Call Ms. Zackary  with the results and send results to Rollene Almarie LABOR, MD   ----- Message ----- From: Interface, [facility] Lab Results In Sent: 12/04/2023  11:36 PM EDT To: Lynwood Schilling, MD

## 2023-12-24 ENCOUNTER — Other Ambulatory Visit: Payer: Self-pay | Admitting: Orthopedic Surgery

## 2023-12-24 DIAGNOSIS — M5416 Radiculopathy, lumbar region: Secondary | ICD-10-CM

## 2023-12-24 DIAGNOSIS — M546 Pain in thoracic spine: Secondary | ICD-10-CM

## 2023-12-26 ENCOUNTER — Ambulatory Visit
Admission: RE | Admit: 2023-12-26 | Discharge: 2023-12-26 | Disposition: A | Discharge status: Home / Self Care | Source: Ambulatory Visit | Attending: Orthopedic Surgery | Admitting: Orthopedic Surgery

## 2023-12-26 DIAGNOSIS — M5416 Radiculopathy, lumbar region: Secondary | ICD-10-CM

## 2023-12-26 DIAGNOSIS — M546 Pain in thoracic spine: Secondary | ICD-10-CM

## 2024-02-02 ENCOUNTER — Other Ambulatory Visit (HOSPITAL_BASED_OUTPATIENT_CLINIC_OR_DEPARTMENT_OTHER): Payer: Self-pay

## 2024-02-02 ENCOUNTER — Other Ambulatory Visit: Payer: Self-pay

## 2024-02-02 MED ORDER — GENTAMICIN SULFATE 0.3 % OP SOLN
OPHTHALMIC | 1 refills | Status: AC
  Filled 2024-02-02: qty 5, 7d supply, fill #0
  Filled 2024-02-08: qty 5, 25d supply, fill #0

## 2024-02-02 MED ORDER — PREDNISOLONE ACETATE 1 % OP SUSP
OPHTHALMIC | 2 refills | Status: AC
  Filled 2024-02-02: qty 5, 28d supply, fill #0
  Filled 2024-02-08: qty 5, 17d supply, fill #0

## 2024-02-04 ENCOUNTER — Other Ambulatory Visit (HOSPITAL_BASED_OUTPATIENT_CLINIC_OR_DEPARTMENT_OTHER): Payer: Self-pay

## 2024-02-08 ENCOUNTER — Other Ambulatory Visit (HOSPITAL_BASED_OUTPATIENT_CLINIC_OR_DEPARTMENT_OTHER): Payer: Self-pay

## 2024-02-09 ENCOUNTER — Other Ambulatory Visit (HOSPITAL_BASED_OUTPATIENT_CLINIC_OR_DEPARTMENT_OTHER): Payer: Self-pay

## 2024-02-10 ENCOUNTER — Other Ambulatory Visit (HOSPITAL_BASED_OUTPATIENT_CLINIC_OR_DEPARTMENT_OTHER): Payer: Self-pay

## 2024-02-11 ENCOUNTER — Other Ambulatory Visit (HOSPITAL_BASED_OUTPATIENT_CLINIC_OR_DEPARTMENT_OTHER): Payer: Self-pay

## 2024-02-23 ENCOUNTER — Other Ambulatory Visit: Payer: Self-pay

## 2024-02-23 ENCOUNTER — Other Ambulatory Visit (HOSPITAL_COMMUNITY): Payer: Self-pay

## 2024-02-23 ENCOUNTER — Other Ambulatory Visit (HOSPITAL_BASED_OUTPATIENT_CLINIC_OR_DEPARTMENT_OTHER): Payer: Self-pay

## 2024-02-29 ENCOUNTER — Ambulatory Visit: Admitting: Internal Medicine

## 2024-02-29 ENCOUNTER — Encounter: Payer: Self-pay | Admitting: Internal Medicine

## 2024-02-29 ENCOUNTER — Ambulatory Visit: Payer: Self-pay

## 2024-02-29 ENCOUNTER — Other Ambulatory Visit (HOSPITAL_BASED_OUTPATIENT_CLINIC_OR_DEPARTMENT_OTHER): Payer: Self-pay

## 2024-02-29 VITALS — BP 124/78 | HR 81 | Temp 97.8°F | Ht 65.0 in | Wt 201.0 lb

## 2024-02-29 DIAGNOSIS — R7303 Prediabetes: Secondary | ICD-10-CM | POA: Diagnosis not present

## 2024-02-29 DIAGNOSIS — L700 Acne vulgaris: Secondary | ICD-10-CM | POA: Insufficient documentation

## 2024-02-29 DIAGNOSIS — I10 Essential (primary) hypertension: Secondary | ICD-10-CM

## 2024-02-29 DIAGNOSIS — G549 Nerve root and plexus disorder, unspecified: Secondary | ICD-10-CM

## 2024-02-29 DIAGNOSIS — E559 Vitamin D deficiency, unspecified: Secondary | ICD-10-CM | POA: Diagnosis not present

## 2024-02-29 MED ORDER — TRAMADOL HCL 50 MG PO TABS
50.0000 mg | ORAL_TABLET | Freq: Four times a day (QID) | ORAL | 0 refills | Status: AC | PRN
  Filled 2024-02-29: qty 30, 7d supply, fill #0

## 2024-02-29 MED ORDER — GABAPENTIN 100 MG PO CAPS
100.0000 mg | ORAL_CAPSULE | Freq: Three times a day (TID) | ORAL | 3 refills | Status: AC
  Filled 2024-02-29: qty 90, 30d supply, fill #0
  Filled 2024-03-26: qty 90, 30d supply, fill #1
  Filled 2024-03-28: qty 90, 30d supply, fill #0

## 2024-02-29 NOTE — Assessment & Plan Note (Signed)
 I suspect has recurrence after previous sugury and did well for some time; pt declines ortho f/u, so tx with tramadol  50 mg prn, and gabapentin 100 tid, and refer Neurosurgury per pt reqeust

## 2024-02-29 NOTE — Assessment & Plan Note (Signed)
 Last vitamin D  Lab Results  Component Value Date   VD25OH 44.47 06/17/2023   Stable, cont oral replacement

## 2024-02-29 NOTE — Progress Notes (Signed)
 Patient ID: Shannon Navarro, female   DOB: 12/28/1955, 68 y.o.   MRN: [id number]        Chief Complaint: follow up back pain now very severe       HPI:  Shannon Navarro is a 68 y.o. female here with c.o mid lower thoracic spine pain and radiation to the right, excruciating at time, and assoc with twisting at the waist or bending.  Pt denies chest pain, increased sob or doe, wheezing, orthopnea, PND, increased LE swelling, palpitations, dizziness or syncope.   Pt denies polydipsia, polyuria, or new focal neuro s/s.   Did incidentally have recent MRI t spine and LS spine with ortho f/u and prescribed PT and ESI, which has not helped. Aug 16 MRI with partial list of findings: Disc bulge at T3-4 results in mild mass effect on the ventral spinal cord. Central disc protrusion and T7-8 exerts mass effect on the ventral spinal cord.  Denies ataxia or falls.  No longer wants to see ortho as pain only getting worse.        Wt Readings from Last 3 Encounters:  02/29/24 201 lb (91.2 kg)  10/09/23 215 lb (97.5 kg)  07/29/23 216 lb 6 oz (98.1 kg)   BP Readings from Last 3 Encounters:  02/29/24 124/78  10/09/23 110/60  08/19/23 139/70         Past Medical History:  Diagnosis Date   Allergic rhinitis    Allergy    Anticoagulant long-term use    XARELTO  no longer taking   Bile duct stone    Dysrhythmia    hx of atrial fib x 2 per pt   Endometrial polyp    Endometrial polyp    Fatty liver 06/23/2007   GERD    Herniated disc, 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   History of esophageal stricture    mainly due to gerd   History of exercise stress test 06/13/2015   PER DR HOCHREIN NOTE -- NO EVIDENCE ISCHEMIA   HYPERTENSION    pt denies   Impaired fasting glucose    Migraine    hx of migraines none recent   OSA on CPAP followed by dr shellia   per study 02-09-2012  severe osa w/ AHI 33.8   Paroxysmal atrial fibrillation (HCC) cardioloigst-  dr hochrein   dx 02-21-2016 at ED   Pneumonia    with COVID,  double, enlarged heart   PONV (postoperative nausea and vomiting)    after surgery in 10/2021- had nausea after surgery related to preop drink   Pre-diabetes    Pseudotumor cerebri 2007   S/P cervical spinal fusion 10/24/2021   Sleep apnea    Thyroid  nodule    Vitamin D  deficiency 07/2013   Past Surgical History:  Procedure Laterality Date   ABDOMINAL HYSTERECTOMY     ANTERIOR CERVICAL DECOMPRESSION/DISCECTOMY FUSION 4 LEVELS N/A 10/24/2021   Procedure: ANTERIOR CERVICAL DECOMPRESSION FUSION CERVICAL FOUR THROUGH CERVICAL FIVE, CERVICAL FIVE THROUGH CERVICAL SIX, CERVICAL SIX THROUGH CERVICAL SEVEN WITH INSTRUMENTATION AND ALLOGRAFT;  Surgeon: Beuford Anes, MD;  Location: [facility];  Service: Orthopedics;  Laterality: N/A;   BALLOON DILATION N/A 04/11/2022   Procedure: BALLOON DILATION;  Surgeon: Charlanne Groom, MD;  Location: [facility];  Service: Gastroenterology;  Laterality: N/A;   BIOPSY  04/11/2022   Procedure: BIOPSY;  Surgeon: Charlanne Groom, MD;  Location: [facility];  Service: Gastroenterology;;   BREAST BIOPSY Right 08/12/2019    FIBROADENOMA    CHOLECYSTECTOMY N/A 04/10/2022   Procedure: LAPAROSCOPIC  CHOLECYSTECTOMY WITH INTRAOPERATIVE CHOLANGIOGRAM;  Surgeon: Kinsinger, Herlene Righter, MD;  Location: [facility];  Service: General;  Laterality: N/A;   COLONOSCOPY     colonscopy  2007   normal   DILATATION & CURETTAGE/HYSTEROSCOPY WITH MYOSURE N/A 03/26/2017   Procedure: DILATATION & CURETTAGE/HYSTEROSCOPY WITH MYOSURE;  Surgeon: Estelle Service, MD;  Location: [facility] [place];  Service: Gynecology;  Laterality: N/A;   DILATION AND CURETTAGE OF UTERUS  2010   ERCP N/A 04/11/2022   Procedure: ENDOSCOPIC RETROGRADE CHOLANGIOPANCREATOGRAPHY (ERCP);  Surgeon: Charlanne Groom, MD;  Location: [facility];  Service: Gastroenterology;  Laterality: N/A;   ESOPHAGOGASTRODUODENOSCOPY N/A 04/11/2022   Procedure: ESOPHAGOGASTRODUODENOSCOPY (EGD);  Surgeon: Charlanne Groom, MD;   Location: [facility];  Service: Gastroenterology;  Laterality: N/A;   FRACTURE SURGERY     IRRIGATION AND DEBRIDEMENT ABSCESS N/A 03/19/2023   Procedure: WOUND EXPLORATION AND DRAIN PLACEMENT;  Surgeon: Eletha Boas, MD;  Location: [facility];  Service: General;  Laterality: N/A;   LAPAROSCOPIC HYSTERECTOMY Bilateral 06/18/2017   Procedure: HYSTERECTOMY TOTAL LAPAROSCOPIC;  Surgeon: Estelle Service, MD;  Location: [facility];  Service: Gynecology;  Laterality: Bilateral;  OUT PT IN BED, needs extra time MD request 2.5hrs in OR   MASS EXCISION Left 02/17/2023   Procedure: EXCISION SOFT TISSUE MASS LEFT BACK;  Surgeon: Eletha Boas, MD;  Location: [facility];  Service: General;  Laterality: Left;   REMOVAL OF STONES  04/11/2022   Procedure: REMOVAL OF STONES;  Surgeon: Charlanne Groom, MD;  Location: [facility];  Service: Gastroenterology;;   ANNETT  04/11/2022   Procedure: ANNETT;  Surgeon: Charlanne Groom, MD;  Location: [facility];  Service: Gastroenterology;;   SPINE SURGERY     TONSILLECTOMY Bilateral 01/10/2022   Procedure: TONSILLECTOMY;  Surgeon: Jesus Oliphant, MD;  Location[facility];  Service: ENT;  Laterality: Bilateral;   torn meniscus surgery on right knee      TRANSTHORACIC ECHOCARDIOGRAM  03/11/2016   ef 60-65%/  mild MR/ trivial PR and TR   TUBAL LIGATION     UPPER GASTROINTESTINAL ENDOSCOPY  11/2020    reports that she has never smoked. She has never been exposed to tobacco smoke. She has never used smokeless tobacco. She reports that she does not drink alcohol and does not use drugs. family history includes Aortic stenosis in her father; Asthma in her mother; Breast cancer in her cousin, maternal aunt, maternal aunt, and mother; Colon cancer in an other family member; Colon polyps in her father; Diabetes in her father; Esophageal cancer in an other family member; Heart disease in her father; Hypertension in her father and mother; Prostate cancer in her  father; Stomach cancer in an other family member. Allergies  Allergen Reactions   Cefaclor Rash and Other (See Comments)   Cipro  [Ciprofloxacin  Hcl] Rash    Hands were red, skin was red    Codeine Nausea And Vomiting   Hydrocodone  Nausea And Vomiting    Severe vomiting/  COUGH SYRUP CAUSE SEVERE VOMITING   Levaquin [Levofloxacin] Nausea Only   Nexium [Esomeprazole Magnesium] Other (See Comments)    Made legs ache   Penicillins Rash   Sulfa Antibiotics Rash   Current Outpatient Medications on File Prior to Visit  Medication Sig Dispense Refill   amLODipine  (NORVASC ) 2.5 MG tablet Take 1 tablet (2.5 mg total) by mouth daily. 90 tablet 3   brimonidine (ALPHAGAN P) 0.1 % SOLN Place into the left eye.     cetirizine  (ZYRTEC ) 10 MG chewable tablet Chew 10  mg by mouth daily.     fluticasone  (FLONASE ) 50 MCG/ACT nasal spray Place 2 sprays into both nostrils daily. (Patient taking differently: Place 2 sprays into both nostrils as needed.) 48 g 6   gentamicin  (GARAMYCIN ) 0.3 % ophthalmic solution Starting AFTER surgery, instill 1 drop in left eye 4 times a day for 1 week then stop. 5 mL 1   metoprolol  tartrate (LOPRESSOR ) 25 MG tablet Take 0.5 tablets (12.5 mg total) by mouth daily. 30 tablet 11   omeprazole  (PRILOSEC OTC) 20 MG tablet Take 20 mg by mouth as needed.     omeprazole  (PRILOSEC) 20 MG capsule Take 1 capsule (20 mg total) by mouth daily. 90 capsule 3   prednisoLONE  acetate (PRED FORTE ) 1 % ophthalmic suspension STARTING AFTER SURGERY Instill 1 drop in left eye 4 times a day for 7 days, then 1 drop 3x daily for 7 days, then 1 drop twice a day for 7 days, then 1 drop in left eye daily for 7 days 5 mL 2   VITAMIN D  PO Take 1 tablet by mouth in the morning.     No current facility-administered medications on file prior to visit.        ROS:  All others reviewed and negative.  Objective        PE:  BP 124/78 (BP Location: Right Arm, Patient Position: Sitting, Cuff Size: Normal)    Pulse 81   Temp 97.8 F (36.6 C) (Oral)   Ht 5' 5 (1.651 m)   Wt 201 lb (91.2 kg)   SpO2 99%   BMI 33.45 kg/m                 Constitutional: Pt appears in NAD               HENT: Head: NCAT.                Right Ear: External ear normal.                 Left Ear: External ear normal.                Eyes: . Pupils are equal, round, and reactive to light. Conjunctivae and EOM are normal               Nose: without d/c or deformity               Neck: Neck supple. Gross normal ROM               Cardiovascular: Normal rate and regular rhythm.                 Pulmonary/Chest: Effort normal and breath sounds without rales or wheezing.                Abd:  Soft, NT, ND, + BS, no organomegaly               Neurological: Pt is alert. At baseline orientation, motor grossly intact, spine without midline tender or right lower back rash               Skin: Skin is warm. No rashes, no other new lesions, LE edema - none               Psychiatric: Pt behavior is normal without agitation   Micro: none  Cardiac tracings I have personally interpreted today:  none  Pertinent Radiological findings (summarize): none   Lab Results  Component Value Date   WBC 6.9 08/19/2023   HGB 15.1 (H) 08/19/2023   HCT 46.2 (H) 08/19/2023   PLT 280 08/19/2023   GLUCOSE 107 (H) 08/19/2023   CHOL 168 12/04/2023   TRIG 63 12/04/2023   HDL 50 12/04/2023   LDLCALC 106 (H) 12/04/2023   ALT 22 08/19/2023   AST 22 08/19/2023   NA 142 08/19/2023   K 3.8 08/19/2023   CL 105 08/19/2023   CREATININE 0.75 08/19/2023   BUN 10 08/19/2023   CO2 27 08/19/2023   TSH 0.90 05/31/2023   INR 1.1 02/17/2023   HGBA1C 5.9 06/17/2023   Assessment/Plan:  Shannon Navarro is a 68 y.o. White or Caucasian [1] female with  has a past medical history of Allergic rhinitis, Allergy, Anticoagulant long-term use, Bile duct stone, Dysrhythmia, Endometrial polyp, Endometrial polyp, Fatty liver (06/23/2007), GERD, Herniated disc,  cervical (10/24/2021), History of esophageal stricture, History of exercise stress test (06/13/2015), HYPERTENSION, Impaired fasting glucose, Migraine, OSA on CPAP (followed by dr shellia), Paroxysmal atrial fibrillation (HCC) (cardioloigst-  dr lavona), Pneumonia, PONV (postoperative nausea and vomiting), Pre-diabetes, Pseudotumor cerebri (2007), S/P cervical spinal fusion (10/24/2021), Sleep apnea, Thyroid  nodule, and Vitamin D  deficiency (07/2013).  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 after previous sugury and did well for some time; pt declines ortho f/u, so tx with tramadol  50 mg prn, and gabapentin 100 tid, and refer Neurosurgury per pt reqeust  Essential hypertension BP Readings from Last 3 Encounters:  02/29/24 124/78  10/09/23 110/60  08/19/23 139/70   Stable, pt to continue medical treatment norvasc  2.5 every day, loperssor 12.5 qd   Prediabetes Last vitamin D  Lab Results  Component Value Date   VD25OH 44.47 06/17/2023   Stable, cont oral replacement   Vitamin D  deficiency Last vitamin D  Lab Results  Component Value Date   VD25OH 44.47 06/17/2023   Stable, cont oral replacement  Followup: Return if symptoms worsen or fail to improve.  Lynwood Rush, MD 02/29/2024 9:35 PM [facility]

## 2024-02-29 NOTE — Patient Instructions (Signed)
 Please take all new medication as prescribed- the tramadol  for pain, and the gabapentin for nerve pain  Please continue all other medications as before, and refills have been done if requested.  Please have the pharmacy call with any other refills you may need.  Please keep your appointments with your specialists as you may have planned  You will be contacted regarding the referral for: Neurosurgury  No other labs needed today

## 2024-02-29 NOTE — Telephone Encounter (Signed)
 FYI Only or Action Required?: Action required by provider: request for appointment.  Patient was last seen in primary care on 07/10/2023 by Rollene Almarie LABOR, MD.  Called Nurse Triage reporting Back Pain.  Symptoms began several days ago.  Interventions attempted: Nothing.  Symptoms are: unchanged.  Triage Disposition: See PCP When Office is Open (Within 3 Days)  Patient/caregiver understands and will follow disposition?: Yes     Copied from CRM #[id number]. Topic: Clinical - Red Word Triage >> Feb 29, 2024  8:54 AM [name] wrote: Red Word that prompted transfer to Nurse Triage: Lower back pain. Pt stated that she believes her vitamin d  is low and wants it check. Reason for Disposition  [1] MODERATE back pain (e.g., interferes with normal activities) AND [2] present > 3 days  Answer Assessment - Initial Assessment Questions 1. ONSET: When did the pain begin? (e.g., minutes, hours, days)     Last week 2. LOCATION: Where does it hurt? (upper, mid or lower back)     middle 3. SEVERITY: How bad is the pain?  (e.g., Scale 1-10; mild, moderate, or severe)     severe 4. PATTERN: Is the pain constant? (e.g., yes, no; constant, intermittent)      Comes and goes 5. RADIATION: Does the pain shoot into your legs or somewhere else?     no 6. CAUSE:  What do you think is causing the back pain?      unsure 7. BACK OVERUSE:  Any recent lifting of heavy objects, strenuous work or exercise?     no 8. MEDICINES: What have you taken so far for the pain? (e.g., nothing, acetaminophen , NSAIDS)     no 9. NEUROLOGIC SYMPTOMS: Do you have any weakness, numbness, or problems with bowel/bladder control?     no 10. OTHER SYMPTOMS: Do you have any other symptoms? (e.g., fever, abdomen pain, burning with urination, blood in urine)       no 11. PREGNANCY: Is there any chance you are pregnant? When was your last menstrual period?       no  Protocols used: Back Pain-A-AH

## 2024-02-29 NOTE — Assessment & Plan Note (Signed)
 BP Readings from Last 3 Encounters:  02/29/24 124/78  10/09/23 110/60  08/19/23 139/70   Stable, pt to continue medical treatment norvasc  2.5 every day, loperssor 12.5 qd

## 2024-03-01 ENCOUNTER — Other Ambulatory Visit (HOSPITAL_BASED_OUTPATIENT_CLINIC_OR_DEPARTMENT_OTHER): Payer: Self-pay

## 2024-03-04 LAB — LIPID PANEL
Chol/HDL Ratio: 2.9 ratio (ref 0.0–4.4)
Cholesterol, Total: 150 mg/dL (ref 100–199)
HDL: 52 mg/dL (ref >39)
LDL Chol Calc (NIH): 84 mg/dL (ref 0–99)
Triglycerides: 68 mg/dL (ref 0–149)
VLDL Cholesterol Cal: 14 mg/dL (ref 5–40)

## 2024-03-07 ENCOUNTER — Other Ambulatory Visit: Payer: Self-pay | Admitting: Neurosurgery

## 2024-03-07 ENCOUNTER — Telehealth (HOSPITAL_BASED_OUTPATIENT_CLINIC_OR_DEPARTMENT_OTHER): Payer: Self-pay

## 2024-03-07 NOTE — Telephone Encounter (Signed)
   Pre-operative Risk Assessment    Patient Name: Shannon Navarro  DOB: 18-Sep-1955 MRN: [id number]   Date of last office visit: 07/15/23 with Hochrein Date of next office visit: NA  Request for Surgical Clearance    Procedure:  L4-5 Lumbar Fusion  Date of Surgery:  Clearance 03/21/24                                 Surgeon:  Dr. Victory Gunnels Surgeon's Group or Practice Name:  [facility] Neurosurgery and Spine Phone number:  [phone] Fax number:  [phone]   Type of Clearance Requested:   - Medical    Type of Anesthesia:  General    Additional requests/questions:    SignedAugustin JONETTA Daring   03/07/2024, 3:05 PM

## 2024-03-08 ENCOUNTER — Telehealth (HOSPITAL_BASED_OUTPATIENT_CLINIC_OR_DEPARTMENT_OTHER): Payer: Self-pay | Admitting: *Deleted

## 2024-03-08 NOTE — Telephone Encounter (Signed)
 Pt has been scheduled tele preop appt 05/27/23. Med rec and consent are done.

## 2024-03-08 NOTE — Telephone Encounter (Signed)
   Name: Shannon Navarro  DOB: Feb 01, 1956  MRN: [id number]  Primary Cardiologist: Lynwood Schilling, MD   Preoperative team, please contact this patient and set up a phone call appointment for further preoperative risk assessment. Please obtain consent and complete medication review. Thank you for your help.  I confirm that guidance regarding antiplatelet and oral anticoagulation therapy has been completed and, if necessary, noted below.  None.   I also confirmed the patient resides in the state of [place] . As per [facility] Medical Board telemedicine laws, the patient must reside in the state in which the provider is licensed.   [name], NP 03/08/2024, 8:25 AM [facility]

## 2024-03-08 NOTE — Telephone Encounter (Signed)
 Pt has been scheduled tele preop appt 03/16/24. Med rec and consent are done.       Patient Consent for Virtual Visit        Shannon Navarro has provided verbal consent on 03/08/2024 for a virtual visit (video or telephone).   CONSENT FOR VIRTUAL VISIT FOR:  Shannon Navarro  By participating in this virtual visit I agree to the following:  I hereby voluntarily request, consent and authorize [facility] and its employed or contracted physicians, physician assistants, nurse practitioners or other licensed health care professionals (the Practitioner), to provide me with telemedicine health care services (the "Services) as deemed necessary by the treating Practitioner. I acknowledge and consent to receive the Services by the Practitioner via telemedicine. I understand that the telemedicine visit will involve communicating with the Practitioner through live audiovisual communication technology and the disclosure of certain medical information by electronic transmission. I acknowledge that I have been given the opportunity to request an in-person assessment or other available alternative prior to the telemedicine visit and am voluntarily participating in the telemedicine visit.  I understand that I have the right to withhold or withdraw my consent to the use of telemedicine in the course of my care at any time, without affecting my right to future care or treatment, and that the Practitioner or I may terminate the telemedicine visit at any time. I understand that I have the right to inspect all information obtained and/or recorded in the course of the telemedicine visit and may receive copies of available information for a reasonable fee.  I understand that some of the potential risks of receiving the Services via telemedicine include:  Delay or interruption in medical evaluation due to technological equipment failure or disruption; Information transmitted may not be sufficient (e.g. poor  resolution of images) to allow for appropriate medical decision making by the Practitioner; and/or  In rare instances, security protocols could fail, causing a breach of personal health information.  Furthermore, I acknowledge that it is my responsibility to provide information about my medical history, conditions and care that is complete and accurate to the best of my ability. I acknowledge that Practitioner's advice, recommendations, and/or decision may be based on factors not within their control, such as incomplete or inaccurate data provided by me or distortions of diagnostic images or specimens that may result from electronic transmissions. I understand that the practice of medicine is not an exact science and that Practitioner makes no warranties or guarantees regarding treatment outcomes. I acknowledge that a copy of this consent can be made available to me via my patient portal [facility] MyChart), or I can request a printed copy by calling the office of [facility].    I understand that my insurance will be billed for this visit.   I have read or had this consent read to me. I understand the contents of this consent, which adequately explains the benefits and risks of the Services being provided via telemedicine.  I have been provided ample opportunity to ask questions regarding this consent and the Services and have had my questions answered to my satisfaction. I give my informed consent for the services to be provided through the use of telemedicine in my medical care

## 2024-03-09 ENCOUNTER — Telehealth: Payer: Self-pay

## 2024-03-09 DIAGNOSIS — I10 Essential (primary) hypertension: Secondary | ICD-10-CM

## 2024-03-09 DIAGNOSIS — E559 Vitamin D deficiency, unspecified: Secondary | ICD-10-CM

## 2024-03-09 NOTE — Telephone Encounter (Signed)
 Copied from CRM [id number]. Topic: Clinical - Request for Lab/Test Order >> Mar 09, 2024  8:47 AM [name] wrote: Reason for CRM: Pt is requesting Vitamin D3 and Magnesium lab orders placed prior to her 11/10 back surgery. She has been deficient in those before and is concerned with her upcoming surgery.  Pt is also thankful to Dr. Norleen for referring her to Dr. Louis for surgery.

## 2024-03-10 NOTE — Addendum Note (Signed)
 Addended by: ROLLENE NORRIS A on: 03/10/2024 08:00 AM   Modules accepted: Orders

## 2024-03-10 NOTE — Telephone Encounter (Signed)
 Patient is aware lab orders placed.

## 2024-03-10 NOTE — Telephone Encounter (Signed)
 Labs placed.

## 2024-03-11 ENCOUNTER — Other Ambulatory Visit (INDEPENDENT_AMBULATORY_CARE_PROVIDER_SITE_OTHER)

## 2024-03-11 DIAGNOSIS — E559 Vitamin D deficiency, unspecified: Secondary | ICD-10-CM

## 2024-03-11 DIAGNOSIS — I10 Essential (primary) hypertension: Secondary | ICD-10-CM

## 2024-03-11 LAB — COMPREHENSIVE METABOLIC PANEL WITH GFR
ALT: 15 U/L (ref 0–35)
AST: 14 U/L (ref 0–37)
Albumin: 3.9 g/dL (ref 3.5–5.2)
Alkaline Phosphatase: 76 U/L (ref 39–117)
BUN: 14 mg/dL (ref 6–23)
CO2: 30 meq/L (ref 19–32)
Calcium: 9.3 mg/dL (ref 8.4–10.5)
Chloride: 107 meq/L (ref 96–112)
Creatinine, Ser: 0.69 mg/dL (ref 0.40–1.20)
GFR: 90.03 mL/min (ref >60.00)
Glucose, Bld: 89 mg/dL (ref 70–99)
Potassium: 3.7 meq/L (ref 3.5–5.1)
Sodium: 144 meq/L (ref 135–145)
Total Bilirubin: 1.1 mg/dL (ref 0.2–1.2)
Total Protein: 6.6 g/dL (ref 6.0–8.3)

## 2024-03-11 LAB — VITAMIN D 25 HYDROXY (VIT D DEFICIENCY, FRACTURES): VITD: 49.76 ng/mL (ref 30.00–100.00)

## 2024-03-11 LAB — MAGNESIUM: Magnesium: 1.9 mg/dL (ref 1.5–2.5)

## 2024-03-14 ENCOUNTER — Ambulatory Visit: Payer: Self-pay | Admitting: Internal Medicine

## 2024-03-15 NOTE — Pre-Procedure Instructions (Signed)
 Surgical Instructions   Your procedure is scheduled on Monday, November 10th.   Report to [facility] Main Entrance A at 1210 A.M., then check in with the Admitting office. Any questions or running late day of surgery: call [phone]  Questions prior to your surgery date: call [phone], Monday-Friday, 8am-4pm. If you experience any cold or flu symptoms such as cough, fever, chills, shortness of breath, etc. between now and your scheduled surgery, please notify us  at the above number.     Remember:  Do not eat or drink  after midnight the night before your surgery    Take these medicines the morning of surgery with A SIP OF WATER: amLODipine  (NORVASC )  gabapentin (NEURONTIN)   May take these medicines IF NEEDED: acetaminophen  (TYLENOL )  fluticasone  (FLONASE )  metoprolol  tartrate (LOPRESSOR )  omeprazole  (PRILOSEC)   One week prior to surgery, STOP taking any Aspirin (unless otherwise instructed by your surgeon) Aleve, Naproxen, Ibuprofen , Motrin , Advil , Goody's, BC's, all herbal medications, fish oil, and non-prescription vitamins.                     Do NOT Smoke (Tobacco/Vaping) for 24 hours prior to your procedure.  If you use a CPAP at night, you may bring your mask/headgear for your overnight stay.   You will be asked to remove any contacts, glasses, piercing's, hearing aid's, dentures/partials prior to surgery. Please bring cases for these items if needed.    Patients discharged the day of surgery will not be allowed to drive home, and someone needs to stay with them for 24 hours.  SURGICAL WAITING ROOM VISITATION Patients may have no more than 2 support people in the waiting area - these visitors may rotate.   Pre-op nurse will coordinate an appropriate time for 1 ADULT support person, who may not rotate, to accompany patient in pre-op.  Children under the age of 47 must have an adult with them who is not the patient and must remain in the main waiting area with  an adult.  If the patient needs to stay at the hospital during part of their recovery, the visitor guidelines for inpatient rooms apply.  Please refer to the [facility] website for the visitor guidelines for any additional information.   If you received a COVID test during your pre-op visit  it is requested that you wear a mask when out in public, stay away from anyone that may not be feeling well and notify your surgeon if you develop symptoms. If you have been in contact with anyone that has tested positive in the last 10 days please notify you surgeon.      Pre-operative 4 CHG Bathing Instructions   You can play a key role in reducing the risk of infection after surgery. Your skin needs to be as free of germs as possible. You can reduce the number of germs on your skin by washing with CHG (chlorhexidine  gluconate) soap before surgery. CHG is an antiseptic soap that kills germs and continues to kill germs even after washing.   DO NOT use if you have an allergy to chlorhexidine /CHG or antibacterial soaps. If your skin becomes reddened or irritated, stop using the CHG and notify one of our RNs at [phone].   Please shower with the CHG soap starting 4 days before surgery using the following schedule:     Please keep in mind the following:  DO NOT shave, including legs and underarms, starting the day of your first shower.  You may shave your face at any point before/day of surgery.  Place clean sheets on your bed the day you start using CHG soap. Use a clean washcloth (not used since being washed) for each shower. DO NOT sleep with pets once you start using the CHG.   CHG Shower Instructions:  Wash your face and private area with normal soap. If you choose to wash your hair, wash first with your normal shampoo.  After you use shampoo/soap, rinse your hair and body thoroughly to remove shampoo/soap residue.  Turn the water OFF and apply  bottle of CHG soap to a CLEAN washcloth.   Apply CHG soap ONLY FROM YOUR NECK DOWN TO YOUR TOES (washing for 3-5 minutes)  DO NOT use CHG soap on face, private areas, open wounds, or sores.  Pay special attention to the area where your surgery is being performed.  If you are having back surgery, having someone wash your back for you may be helpful. Wait 2 minutes after CHG soap is applied, then you may rinse off the CHG soap.  Pat dry with a clean towel  Put on clean clothes/pajamas   If you choose to wear lotion, please use ONLY the CHG-compatible lotions that are listed below.  Additional instructions for the day of surgery:  If you choose, you may shower the morning of surgery with an antibacterial soap.  DO NOT APPLY any lotions, deodorants, cologne, or perfumes.   Do not bring valuables to the hospital. [facility] is not responsible for any belongings/valuables. Do not wear nail polish, gel polish, artificial nails, or any other type of covering on natural nails (fingers and toes) Do not wear jewelry or makeup Put on clean/comfortable clothes.  Please brush your teeth.  Ask your nurse before applying any prescription medications to the skin.     CHG Compatible Lotions   Aveeno Moisturizing lotion  Cetaphil Moisturizing Cream  Cetaphil Moisturizing Lotion  Clairol Herbal Essence Moisturizing Lotion, Dry Skin  Clairol Herbal Essence Moisturizing Lotion, Extra Dry Skin  Clairol Herbal Essence Moisturizing Lotion, Normal Skin  Curel Age Defying Therapeutic Moisturizing Lotion with Alpha Hydroxy  Curel Extreme Care Body Lotion  Curel Soothing Hands Moisturizing Hand Lotion  Curel Therapeutic Moisturizing Cream, Fragrance-Free  Curel Therapeutic Moisturizing Lotion, Fragrance-Free  Curel Therapeutic Moisturizing Lotion, Original Formula  Eucerin Daily Replenishing Lotion  Eucerin Dry Skin Therapy Plus Alpha Hydroxy Crme  Eucerin Dry Skin Therapy Plus Alpha Hydroxy Lotion  Eucerin Original Crme  Eucerin Original  Lotion  Eucerin Plus Crme Eucerin Plus Lotion  Eucerin TriLipid Replenishing Lotion  Keri Anti-Bacterial Hand Lotion  Keri Deep Conditioning Original Lotion Dry Skin Formula Softly Scented  Keri Deep Conditioning Original Lotion, Fragrance Free Sensitive Skin Formula  Keri Lotion Fast Absorbing Fragrance Free Sensitive Skin Formula  Keri Lotion Fast Absorbing Softly Scented Dry Skin Formula  Keri Original Lotion  Keri Skin Renewal Lotion Keri Silky Smooth Lotion  Keri Silky Smooth Sensitive Skin Lotion  Nivea Body Creamy Conditioning Oil  Nivea Body Extra Enriched Lotion  Nivea Body Original Lotion  Nivea Body Sheer Moisturizing Lotion Nivea Crme  Nivea Skin Firming Lotion  NutraDerm 30 Skin Lotion  NutraDerm Skin Lotion  NutraDerm Therapeutic Skin Cream  NutraDerm Therapeutic Skin Lotion  ProShield Protective Hand Cream  Provon moisturizing lotion  Please read over the following fact sheets that you were given.

## 2024-03-16 ENCOUNTER — Ambulatory Visit: Attending: Cardiology | Admitting: Physician Assistant

## 2024-03-16 ENCOUNTER — Encounter (HOSPITAL_COMMUNITY)
Admission: RE | Admit: 2024-03-16 | Discharge: 2024-03-16 | Disposition: A | Discharge status: Home / Self Care | Source: Ambulatory Visit | Attending: Neurosurgery | Admitting: Neurosurgery

## 2024-03-16 ENCOUNTER — Encounter (HOSPITAL_COMMUNITY): Payer: Self-pay

## 2024-03-16 ENCOUNTER — Other Ambulatory Visit: Payer: Self-pay

## 2024-03-16 VITALS — BP 147/62 | HR 53 | Temp 97.7°F | Resp 17 | Ht 65.0 in | Wt 202.3 lb

## 2024-03-16 DIAGNOSIS — M4310 Spondylolisthesis, site unspecified: Secondary | ICD-10-CM | POA: Insufficient documentation

## 2024-03-16 DIAGNOSIS — M51369 Other intervertebral disc degeneration, lumbar region without mention of lumbar back pain or lower extremity pain: Secondary | ICD-10-CM | POA: Insufficient documentation

## 2024-03-16 DIAGNOSIS — I10 Essential (primary) hypertension: Secondary | ICD-10-CM | POA: Diagnosis not present

## 2024-03-16 DIAGNOSIS — M5124 Other intervertebral disc displacement, thoracic region: Secondary | ICD-10-CM | POA: Insufficient documentation

## 2024-03-16 DIAGNOSIS — G4733 Obstructive sleep apnea (adult) (pediatric): Secondary | ICD-10-CM | POA: Insufficient documentation

## 2024-03-16 DIAGNOSIS — M4317 Spondylolisthesis, lumbosacral region: Secondary | ICD-10-CM | POA: Diagnosis not present

## 2024-03-16 DIAGNOSIS — R7303 Prediabetes: Secondary | ICD-10-CM | POA: Insufficient documentation

## 2024-03-16 DIAGNOSIS — I480 Paroxysmal atrial fibrillation: Secondary | ICD-10-CM | POA: Insufficient documentation

## 2024-03-16 DIAGNOSIS — Z01810 Encounter for preprocedural cardiovascular examination: Secondary | ICD-10-CM

## 2024-03-16 DIAGNOSIS — Z01818 Encounter for other preprocedural examination: Secondary | ICD-10-CM

## 2024-03-16 DIAGNOSIS — E041 Nontoxic single thyroid nodule: Secondary | ICD-10-CM | POA: Insufficient documentation

## 2024-03-16 DIAGNOSIS — Z01812 Encounter for preprocedural laboratory examination: Secondary | ICD-10-CM | POA: Insufficient documentation

## 2024-03-16 DIAGNOSIS — K760 Fatty (change of) liver, not elsewhere classified: Secondary | ICD-10-CM | POA: Diagnosis not present

## 2024-03-16 DIAGNOSIS — G932 Benign intracranial hypertension: Secondary | ICD-10-CM | POA: Insufficient documentation

## 2024-03-16 LAB — CBC
HCT: 41.5 % (ref 36.0–46.0)
Hemoglobin: 13.6 g/dL (ref 12.0–15.0)
MCH: 28.5 pg (ref 26.0–34.0)
MCHC: 32.8 g/dL (ref 30.0–36.0)
MCV: 87 fL (ref 80.0–100.0)
Platelets: 242 K/uL (ref 150–400)
RBC: 4.77 MIL/uL (ref 3.87–5.11)
RDW: 13.4 % (ref 11.5–15.5)
WBC: 4.7 K/uL (ref 4.0–10.5)
nRBC: 0 % (ref 0.0–0.2)

## 2024-03-16 LAB — TYPE AND SCREEN
ABO/RH(D): A NEG
Antibody Screen: NEGATIVE

## 2024-03-16 LAB — SURGICAL PCR SCREEN
MRSA, PCR: NEGATIVE
Staphylococcus aureus: POSITIVE — AB

## 2024-03-16 NOTE — Progress Notes (Signed)
 PCP - Dr. Almarie Cleveland Cardiologist - Dr. Lynwood Schilling - clearance via phone visit on 03-16-24  PPM/ICD - denies Device Orders - n/a Rep Notified - n/a  Chest x-ray - n/a EKG - 08-20-23 Stress Test - 06-13-15 ECHO - 04-09-22 Cardiac Cath - denies  Sleep Study - positive CPAP - wears cpap nightly  NON-diabetic  Last dose of GLP1 agonist-  denies GLP1 instructions: n/a  Blood Thinner Instructions: denies Aspirin Instructions: denies  ERAS Protcol - npo PRE-SURGERY Ensure or G2- none  COVID TEST- n/a   Anesthesia review: yes, HTN, OSA w/cpap  Patient denies shortness of breath, fever, cough and chest pain at PAT appointment.   All instructions explained to the patient, with a verbal understanding of the material. Patient agrees to go over the instructions while at home for a better understanding. Patient also instructed to self quarantine after being tested for COVID-19. The opportunity to ask questions was provided.

## 2024-03-16 NOTE — Progress Notes (Signed)
 Virtual Visit via Telephone Note   Because of Shannon Navarro co-morbid illnesses, she is at least at moderate risk for complications without adequate follow up.  This format is felt to be most appropriate for this patient at this time.  Due to technical limitations with video connection (technology), today's appointment will be conducted as an audio only telehealth visit, and Shannon Navarro verbally agreed to proceed in this manner.   All issues noted in this document were discussed and addressed.  No physical exam could be performed with this format.  Evaluation Performed:  Preoperative cardiovascular risk assessment _____________   Date:  03/16/2024   Patient ID:  Shannon Navarro, DOB [birth year], MRN [id number] Patient Location:  Home Provider location:   Office  Primary Care Provider:  Rollene Almarie LABOR, MD Primary Cardiologist:  Lynwood Schilling, MD  Chief Complaint / Patient Profile   68 y.o. y/o female with a h/o Atypical chest discomfort/aortic atherosclerosis, paroxysmal atrial fibrillation, lipoma removal, bradycardia, bruit, dyslipidemia, hypertension, and OSA who is pending L4-5 Lumbar fusion, rods to stabilize and presents today for telephonic preoperative cardiovascular risk assessment.  History of Present Illness    Shannon Navarro is a 67 y.o. female who presents via audio/video conferencing for a telehealth visit today.  Pt was last seen in cardiology clinic on 07/15/2023 by Dr. Schilling.  At that time Shannon Navarro was doing well.  The patient is now pending procedure as outlined above. Since her last visit, she  has been doing great without any palpitations but did have issues with her CPAP device. She felt like the pressure was too strong. The osa events went down once her device was titrated. She has not had SOB or chest pain. Cholesterol had been up a little and then LDL is now down to 84.  She does meet METs on the DASI.  No medications were indicated  as needing held.  Past Medical History    Past Medical History:  Diagnosis Date   Allergic rhinitis    Allergy    Anticoagulant long-term use    XARELTO  no longer taking   Bile duct stone    Dysrhythmia    hx of atrial fib x 2 per pt   Endometrial polyp    Endometrial polyp    Fatty liver 06/23/2007   GERD    Herniated disc, 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   History of esophageal stricture    mainly due to gerd   History of exercise stress test 06/13/2015   PER DR HOCHREIN NOTE -- NO EVIDENCE ISCHEMIA   HYPERTENSION    pt denies   Impaired fasting glucose    Migraine    hx of migraines none recent   OSA on CPAP followed by dr shellia   per study 02-09-2012  severe osa w/ AHI 33.8   Paroxysmal atrial fibrillation (HCC) cardioloigst-  dr hochrein   dx 02-21-2016 at ED   Pneumonia    with COVID, double, enlarged heart   PONV (postoperative nausea and vomiting)    after surgery in 10/2021- had nausea after surgery related to preop drink   Pre-diabetes    Pseudotumor cerebri 2007   S/P cervical spinal fusion 10/24/2021   Sleep apnea    Thyroid  nodule    Vitamin D  deficiency 07/2013   Past Surgical History:  Procedure Laterality Date   ABDOMINAL HYSTERECTOMY     ANTERIOR CERVICAL DECOMPRESSION/DISCECTOMY FUSION 4 LEVELS N/A 10/24/2021   Procedure: ANTERIOR CERVICAL DECOMPRESSION FUSION CERVICAL  FOUR THROUGH CERVICAL FIVE, CERVICAL FIVE THROUGH CERVICAL SIX, CERVICAL SIX THROUGH CERVICAL SEVEN WITH INSTRUMENTATION AND ALLOGRAFT;  Surgeon: Beuford Anes, MD;  Location: [facility] OR;  Service: Orthopedics;  Laterality: N/A;   BALLOON DILATION N/A 04/11/2022   Procedure: BALLOON DILATION;  Surgeon: Charlanne Groom, MD;  Location: [facility];  Service: Gastroenterology;  Laterality: N/A;   BIOPSY  04/11/2022   Procedure: BIOPSY;  Surgeon: Charlanne Groom, MD;  Location: [facility];  Service: Gastroenterology;;   BREAST BIOPSY Right 08/12/2019    FIBROADENOMA    CHOLECYSTECTOMY N/A 04/10/2022    Procedure: LAPAROSCOPIC CHOLECYSTECTOMY WITH INTRAOPERATIVE CHOLANGIOGRAM;  Surgeon: Stevie Herlene Righter, MD;  Location: [facility];  Service: General;  Laterality: N/A;   COLONOSCOPY     colonscopy  2007   normal   DILATATION & CURETTAGE/HYSTEROSCOPY WITH MYOSURE N/A 03/26/2017   Procedure: DILATATION & CURETTAGE/HYSTEROSCOPY WITH MYOSURE;  Surgeon: Estelle Service, MD;  Location: [facility] [place];  Service: Gynecology;  Laterality: N/A;   DILATION AND CURETTAGE OF UTERUS  2010   ERCP N/A 04/11/2022   Procedure: ENDOSCOPIC RETROGRADE CHOLANGIOPANCREATOGRAPHY (ERCP);  Surgeon: Charlanne Groom, MD;  Location: [facility];  Service: Gastroenterology;  Laterality: N/A;   ESOPHAGOGASTRODUODENOSCOPY N/A 04/11/2022   Procedure: ESOPHAGOGASTRODUODENOSCOPY (EGD);  Surgeon: Charlanne Groom, MD;  Location: [facility];  Service: Gastroenterology;  Laterality: N/A;   FRACTURE SURGERY     IRRIGATION AND DEBRIDEMENT ABSCESS N/A 03/19/2023   Procedure: WOUND EXPLORATION AND DRAIN PLACEMENT;  Surgeon: Eletha Boas, MD;  Location: [facility];  Service: General;  Laterality: N/A;   LAPAROSCOPIC HYSTERECTOMY Bilateral 06/18/2017   Procedure: HYSTERECTOMY TOTAL LAPAROSCOPIC;  Surgeon: Estelle Service, MD;  Location: [facility] [place];  Service: Gynecology;  Laterality: Bilateral;  OUT PT IN BED, needs extra time MD request 2.5hrs in OR   MASS EXCISION Left 02/17/2023   Procedure: EXCISION SOFT TISSUE MASS LEFT BACK;  Surgeon: Eletha Boas, MD;  Location: [facility];  Service: General;  Laterality: Left;   REMOVAL OF STONES  04/11/2022   Procedure: REMOVAL OF STONES;  Surgeon: Charlanne Groom, MD;  Location: [facility];  Service: Gastroenterology;;   ANNETT  04/11/2022   Procedure: SPHINCTEROTOMY;  Surgeon: Charlanne Groom, MD;  Location: [facility];  Service: Gastroenterology;;   SPINE SURGERY     TONSILLECTOMY Bilateral 01/10/2022   Procedure: TONSILLECTOMY;  Surgeon: Jesus Oliphant, MD;   [facility] OR;  Service: ENT;  Laterality: Bilateral;   torn meniscus surgery on right knee      TRANSTHORACIC ECHOCARDIOGRAM  03/11/2016   ef 60-65%/  mild MR/ trivial PR and TR   TUBAL LIGATION     UPPER GASTROINTESTINAL ENDOSCOPY  11/2020    Allergies  Allergies  Allergen Reactions   Cefaclor Rash and Other (See Comments)   Cipro  [Ciprofloxacin  Hcl] Rash    Hands were red, skin was red    Codeine Nausea And Vomiting   Hydrocodone  Nausea And Vomiting    Severe vomiting/  COUGH SYRUP CAUSE SEVERE VOMITING   Levaquin [Levofloxacin] Nausea Only   Nexium [Esomeprazole Magnesium] Other (See Comments)    Made legs ache   Penicillins Rash   Sulfa Antibiotics Rash    Home Medications    Prior to Admission medications   Medication Sig Start Date End Date Taking? Authorizing Provider  acetaminophen  (TYLENOL ) 500 MG tablet Take 1,000 mg by mouth every 6 (six) hours as needed for moderate pain (pain score 4-6).    [provider]  amLODipine  (NORVASC ) 2.5 MG tablet Take  1 tablet (2.5 mg total) by mouth daily. 07/16/23   Rollene Almarie LABOR, MD  ascorbic acid (VITAMIN C) 500 MG tablet Take 500 mg by mouth daily.    [provider]  fluticasone  (FLONASE ) 50 MCG/ACT nasal spray Place 2 sprays into both nostrils daily. Patient taking differently: Place 2 sprays into both nostrils daily as needed for allergies. 07/10/23   Rollene Almarie LABOR, MD  gabapentin (NEURONTIN) 100 MG capsule Take 1 capsule (100 mg total) by mouth 3 (three) times daily. 02/29/24   Norleen Lynwood ORN, MD  gentamicin  (GARAMYCIN ) 0.3 % ophthalmic solution Starting AFTER surgery, instill 1 drop in left eye 4 times a day for 1 week then stop. Patient not taking: Reported on 03/14/2024 02/02/24     MAGNESIUM PO Take 240 mg by mouth daily.    [provider]  metoprolol  tartrate (LOPRESSOR ) 25 MG tablet Take 0.5 tablets (12.5 mg total) by mouth daily. Patient taking differently: Take 12.5 mg by mouth  daily as needed (heart rate over 120). 12/29/22   Lavona Lynwood, MD  omeprazole  (PRILOSEC) 20 MG capsule Take 1 capsule (20 mg total) by mouth daily. Patient taking differently: Take 20 mg by mouth daily as needed (acid reflux). 10/09/23   Beather Delon Gibson, PA  prednisoLONE  acetate (PRED FORTE ) 1 % ophthalmic suspension STARTING AFTER SURGERY Instill 1 drop in left eye 4 times a day for 7 days, then 1 drop 3x daily for 7 days, then 1 drop twice a day for 7 days, then 1 drop in left eye daily for 7 days 02/02/24     traMADol  (ULTRAM ) 50 MG tablet Take 1 tablet (50 mg total) by mouth every 6 (six) hours as needed. Patient not taking: Reported on 03/14/2024 02/29/24   Norleen Lynwood ORN, MD  VITAMIN D  PO Take 1,000 Units by mouth in the morning.    [provider]  zinc gluconate 50 MG tablet Take 50 mg by mouth daily.    [provider]    Physical Exam    Vital Signs:  Shannon Navarro does not have vital signs available for review today.  118/60  Given telephonic nature of communication, physical exam is limited. AAOx3. NAD. Normal affect.  Speech and respirations are unlabored.  Accessory Clinical Findings    None  Assessment & Plan    1.  Preoperative Cardiovascular Risk Assessment:  [name] perioperative risk of a major cardiac event is 0.9% according to the Revised Cardiac Risk Index (RCRI).  Therefore, she is at low risk for perioperative complications.   Her functional capacity is fair at 4.73 METs according to the [facility] Activity Status Index (DASI). Recommendations: According to ACC/AHA guidelines, no further cardiovascular testing needed.  The patient may proceed to surgery at acceptable risk.     The patient was advised that if she develops new symptoms prior to surgery to contact our office to arrange for a follow-up visit, and she verbalized understanding.  A copy of this note will be routed to requesting surgeon.  Time:   Today, I have spent 8  minutes with the patient with telehealth technology discussing medical history, symptoms, and management plan.     Shannon LOISE Fabry, PA-C  03/16/2024, 8:20 AM

## 2024-03-16 NOTE — Progress Notes (Signed)
 This RN contacted Shanda at Dr. Marda office to inform of abnormal PCR swab.

## 2024-03-17 NOTE — Progress Notes (Signed)
 Anesthesia Chart Review:  Case: [id number] Date/Time: 03/21/24 1354   Procedure: POSTERIOR LUMBAR FUSION 1 LEVEL (Back) - PLIF - L4-L5 - Posterior Lateral and Interbody fusion   Anesthesia type: General   Diagnosis: Degenerative spondylolisthesis [M43.10]   Pre-op diagnosis: Degenerative spondylolisthesis   [facility] ROOM 18 / [facility]   Surgeons: Louis Shove, MD       DISCUSSION: Patient is a 68 year old female scheduled for the above procedure.  History includes never smoker, postoperative N/V, HTN, PAF (02/21/16; recurrent episodes 09/23/2022 & 08/19/2023, converted to SR after metoprolol ; no on DOAC due to post-operative hematoma ~ 02/2023), OSA (uses CPAP), pre-diabetes, thyroid  nodule, GERD, hiatal hernia, fatty liver, pseudotumor cerebri (2007), hysterectomy (06/18/17), spinal surgery (C4-7 ACDF 10/24/2021), cholecystectomy (04/10/2022), lipoma (s/p excision 02/17/2023, s/p debridement/wound VAC placement for delayed wound healing 03/19/2023). BMI is consistent with obesity.    She has a clinical FYI of DIFFICULT AIRWAY: - Mask ventilation without difficulty, Mac and 3, stylet and oral airway, 7.0 mm ETT, Grade I view, 1 attempt, Difficulty was anticipated 03/19/2023  - Mac and 3, 7.0 mm ETT, 1 attempt, Grade II view 03/20/2023 - Mac and 4, 7.0 mm ETT, 1 attempt Grade I view 04/11/2022 [place] and 2, 1 attempt. Grade 1 view, pinched left upper lip between teeth and lip with intubation and pressure applied after intubation, teeth and oropharynx atraumatic - Glidescope and 3, 7.0 mm ETT, 1 attempt, Grade I view 01/10/2022 - Glidescope and 4, 7.5 mm ETT 1 attempt, Grade II view 10/24/2021   She had a telephonic preoperative cardiac risk evaluation by Lucien Blanc, PA-C on 03/16/2024. She wrote, Ms. Bloch's perioperative risk of a major cardiac event is 0.9% according to the Revised Cardiac Risk Index (RCRI).  Therefore, she is at low risk for perioperative complications.   Her functional  capacity is fair at 4.73 METs according to the [facility] Activity Status Index (DASI). Recommendations: According to ACC/AHA guidelines, no further cardiovascular testing needed.  The patient may proceed to surgery at acceptable risk.  She is on metoprolol  12.5 mg daily as needed for HR > 120 bpm (has underlying bradycardia).   Anesthesia team to evaluate on the day of surgery.   VS: BP (!) 147/62   Pulse (!) 53   Temp 36.5 C   Resp 17   Ht 5' 5 (1.651 m)   Wt 91.8 kg   SpO2 100%   BMI 33.66 kg/m    PROVIDERS: Rollene Almarie LABOR, MD is PCP  Lavona Agent, MD is cardiologist Shellia Oh, MD is pulmonologist (for OSA; [facility]) Eletha Boas, MD is general surgeon (for lipoma, thyroid  nodule)   LABS: Most recent labs results in [facility] include: Lab Results  Component Value Date   WBC 4.7 03/16/2024   HGB 13.6 03/16/2024   HCT 41.5 03/16/2024   PLT 242 03/16/2024   GLUCOSE 89 03/11/2024   CHOL 150 03/03/2024   TRIG 68 03/03/2024   HDL 52 03/03/2024   LDLCALC 84 03/03/2024   ALT 15 03/11/2024   AST 14 03/11/2024   NA 144 03/11/2024   K 3.7 03/11/2024   CL 107 03/11/2024   CREATININE 0.69 03/11/2024   BUN 14 03/11/2024   CO2 30 03/11/2024   TSH 0.90 05/31/2023   INR 1.1 02/17/2023   HGBA1C 5.9 06/17/2023   Labs Reviewed  SURGICAL PCR SCREEN - Abnormal; Notable for the following components:      Result Value   Staphylococcus aureus POSITIVE (*)  All other components within normal limits  CBC  TYPE AND SCREEN     IMAGES: MRI T/L-spine 12/26/2023: IMPRESSION: 1. Disc bulge at T3-4 with mild mass effect on the ventral spinal cord. 2. Central disc protrusion at T7-8 with mass effect on the ventral spinal cord. Bilateral facet arthropathy at T7-8 and T8-9 without significant neural foraminal narrowing. 3. Right eccentric disc bulge and facet arthropathy at L4-5 contribute to moderate narrowing of the right lateral recess with mass effect on the traversing right  L5 nerve root and subarticular zone. Moderate right neural foraminal narrowing. 4. Anterolisthesis at L5-6 with uncovertebral and left greater than right facet arthropathy contribute to moderate narrowing of the left greater than right lateral recesses with mass effect on the traversing S1 nerve roots in the subarticular zones. Moderate left neural foraminal narrowing.  CXR 08/19/2023: FINDINGS: The heart and mediastinal contours are within normal limits. No focal consolidation. No pulmonary edema. No pleural effusion. No pneumothorax. No acute osseous abnormality. IMPRESSION: No active cardiopulmonary disease.  US  Thyroid  04/30/2023: IMPRESSION: Right inferior thyroid  nodule (labeled 1) meets criteria for continued surveillance, as designated by the newly established ACR TI-RADS criteria. Surveillance ultrasound study recommended to be performed annually up to 5 years. - Recommendations follow those established by the new ACR TI-RADS criteria (J Am Coll Radiol 2017;14:587-595).   EKG: EKG 08/19/2023 21:02: Normal sinus rhythm Cannot rule out Anterior infarct , age undetermined Abnormal ECG When compared with ECG of 19-Aug-2023 20:10, PREVIOUS ECG IS PRESENT Confirmed by Ula Barter [phone]) on 08/19/2023 9:50:24 PM  EKG 08/19/2023 20:10: Atrial fibrillation at 152 bpm with rapid ventricular response Cannot rule out Anterior infarct , age undetermined Marked ST abnormality, possible inferior subendocardial injury Abnormal ECG When compared with ECG of 25-Mar-2023 15:08, PREVIOUS ECG IS PRESENT Confirmed by Francesca Fallow ([id number]) on 08/20/2023 1:58:20 PM   CV: US  Abd Aorta 08/18/2023: Summary:  Abdominal Aorta: No evidence of an abdominal aortic aneurysm was  visualized. The largest aortic measurement is 2.1 cm. No previous exam  available for comparison.    Echo 04/09/2022: IMPRESSIONS   1. Left ventricular ejection fraction, by estimation, is 60 to 65%. The  left  ventricle has normal function. The left ventricle has no regional  wall motion abnormalities. Left ventricular diastolic parameters are  indeterminate.   2. Right ventricular systolic function is normal. The right ventricular  size is normal. There is normal pulmonary artery systolic pressure.   3. No evidence of mitral valve regurgitation.   4. Aortic valve regurgitation is not visualized.   5. The inferior vena cava is normal in size with greater than 50%  respiratory variability, suggesting right atrial pressure of 3 mmHg.  - Comparison(s): No significant change from prior study.    Long term monitor 03/30/2022 - 04/02/2022: Normal sinus rhythm Infrequent runs of SVT with the longest rn being 13 beats No sustained arrhythmias.    US  Carotid 10/03/2020: Summary:  - Right Carotid: There is no evidence of stenosis in the right ICA.  - Left Carotid: There is no evidence of stenosis in the left ICA.  - Vertebrals:  Bilateral vertebral arteries demonstrate antegrade flow.  - Subclavians: Normal flow hemodynamics were seen in bilateral subclavian arteries.       ETT 06/13/15: There was no ST segment deviation noted during stress.   Past Medical History:  Diagnosis Date   Allergic rhinitis    Allergy    Anticoagulant long-term use    XARELTO  no longer taking  Bile duct stone    Dysrhythmia    hx of atrial fib x 2 per pt   Endometrial polyp    Endometrial polyp    Fatty liver 06/23/2007   GERD    Herniated disc, 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   History of esophageal stricture    mainly due to gerd   History of exercise stress test 06/13/2015   PER DR HOCHREIN NOTE -- NO EVIDENCE ISCHEMIA   HYPERTENSION    pt denies   Impaired fasting glucose    Migraine    hx of migraines none recent   OSA on CPAP followed by dr shellia   per study 02-09-2012  severe osa w/ AHI 33.8   Paroxysmal atrial fibrillation (HCC) cardioloigst-  dr hochrein   dx 02-21-2016 at ED   Pneumonia    with COVID,  double, enlarged heart   PONV (postoperative nausea and vomiting)    after surgery in 10/2021- had nausea after surgery related to preop drink   Pre-diabetes    Pseudotumor cerebri 2007   S/P cervical spinal fusion 10/24/2021   Sleep apnea    Thyroid  nodule    Vitamin D  deficiency 07/2013    Past Surgical History:  Procedure Laterality Date   ABDOMINAL HYSTERECTOMY     ANTERIOR CERVICAL DECOMPRESSION/DISCECTOMY FUSION 4 LEVELS N/A 10/24/2021   Procedure: ANTERIOR CERVICAL DECOMPRESSION FUSION CERVICAL FOUR THROUGH CERVICAL FIVE, CERVICAL FIVE THROUGH CERVICAL SIX, CERVICAL SIX THROUGH CERVICAL SEVEN WITH INSTRUMENTATION AND ALLOGRAFT;  Surgeon: Beuford Anes, MD;  Location: [facility];  Service: Orthopedics;  Laterality: N/A;   BALLOON DILATION N/A 04/11/2022   Procedure: BALLOON DILATION;  Surgeon: Charlanne Groom, MD;  Location: [facility];  Service: Gastroenterology;  Laterality: N/A;   BIOPSY  04/11/2022   Procedure: BIOPSY;  Surgeon: Charlanne Groom, MD;  [facility]: [facility];  Service: Gastroenterology;;   BREAST BIOPSY Right 08/12/2019    FIBROADENOMA    CHOLECYSTECTOMY N/A 04/10/2022   Procedure: LAPAROSCOPIC CHOLECYSTECTOMY WITH INTRAOPERATIVE CHOLANGIOGRAM;  Surgeon: Stevie Herlene Righter, MD;  Location: [facility];  Service: General;  Laterality: N/A;   COLONOSCOPY     colonscopy  2007   normal   DILATATION & CURETTAGE/HYSTEROSCOPY WITH MYOSURE N/A 03/26/2017   Procedure: DILATATION & CURETTAGE/HYSTEROSCOPY WITH MYOSURE;  Surgeon: Estelle Service, MD;  Location[facility];  Service: Gynecology;  Laterality: N/A;   DILATION AND CURETTAGE OF UTERUS  2010   ERCP N/A 04/11/2022   Procedure: ENDOSCOPIC RETROGRADE CHOLANGIOPANCREATOGRAPHY (ERCP);  Surgeon: Charlanne Groom, MD;  Location: [facility];  Service: Gastroenterology;  Laterality: N/A;   ESOPHAGOGASTRODUODENOSCOPY N/A 04/11/2022   Procedure: ESOPHAGOGASTRODUODENOSCOPY (EGD);  Surgeon: Charlanne Groom, MD;   Location: [facility];  Service: Gastroenterology;  Laterality: N/A;   FRACTURE SURGERY     IRRIGATION AND DEBRIDEMENT ABSCESS N/A 03/19/2023   Procedure: WOUND EXPLORATION AND DRAIN PLACEMENT;  Surgeon: Eletha Boas, MD;  Location: [facility];  Service: General;  Laterality: N/A;   LAPAROSCOPIC HYSTERECTOMY Bilateral 06/18/2017   Procedure: HYSTERECTOMY TOTAL LAPAROSCOPIC;  Surgeon: Estelle Service, MD;  Location[facility];  Service: Gynecology;  Laterality: Bilateral;  OUT PT IN BED, needs extra time MD request 2.5hrs in OR   MASS EXCISION Left 02/17/2023   Procedure: EXCISION SOFT TISSUE MASS LEFT BACK;  Surgeon: Eletha Boas, MD;  [facility];  Service: General;  Laterality: Left;   REMOVAL OF STONES  04/11/2022   Procedure: REMOVAL OF STONES;  Surgeon: Charlanne Groom, MD;  [facility];  Service: Gastroenterology;;  SPHINCTEROTOMY  04/11/2022   Procedure: SPHINCTEROTOMY;  Surgeon: Charlanne Groom, MD;  Location: [facility];  Service: Gastroenterology;;   SPINE SURGERY     TONSILLECTOMY Bilateral 01/10/2022   Procedure: TONSILLECTOMY;  Surgeon: Jesus Oliphant, MD;  Location: [facility] OR;  Service: ENT;  Laterality: Bilateral;   torn meniscus surgery on right knee      TRANSTHORACIC ECHOCARDIOGRAM  03/11/2016   ef 60-65%/  mild MR/ trivial PR and TR   TUBAL LIGATION     UPPER GASTROINTESTINAL ENDOSCOPY  11/2020    MEDICATIONS:  acetaminophen  (TYLENOL ) 500 MG tablet   amLODipine  (NORVASC ) 2.5 MG tablet   ascorbic acid (VITAMIN C) 500 MG tablet   fluticasone  (FLONASE ) 50 MCG/ACT nasal spray   gabapentin (NEURONTIN) 100 MG capsule   gentamicin  (GARAMYCIN ) 0.3 % ophthalmic solution   MAGNESIUM PO   metoprolol  tartrate (LOPRESSOR ) 25 MG tablet   omeprazole  (PRILOSEC) 20 MG capsule   prednisoLONE  acetate (PRED FORTE ) 1 % ophthalmic suspension   traMADol  (ULTRAM ) 50 MG tablet   VITAMIN D  PO   zinc gluconate 50 MG tablet   No current facility-administered  medications for this encounter.    Isaiah Ruder, PA-C Surgical Short Stay/Anesthesiology [facility] Phone [phone] [facility] Phone [phone] 03/17/2024 11:53 AM

## 2024-03-17 NOTE — Anesthesia Preprocedure Evaluation (Addendum)
 Anesthesia Evaluation  Patient identified by MRN, date of birth, ID band Patient awake    Reviewed: Allergy & Precautions, NPO status , Patient's Chart, lab work & pertinent test results, reviewed documented beta blocker date and time   History of Anesthesia Complications (+) PONV and history of anesthetic complications  Airway Mallampati: II  TM Distance: >3 FB Neck ROM: Full    Dental no notable dental hx.    Pulmonary sleep apnea and Continuous Positive Airway Pressure Ventilation , pneumonia, neg COPD   breath sounds clear to auscultation       Cardiovascular hypertension, (-) CAD, (-) Past MI and (-) Cardiac Stents + dysrhythmias Atrial Fibrillation  Rhythm:Regular Rate:Normal     Neuro/Psych  Headaches, neg Seizures    GI/Hepatic ,GERD  ,,(+) neg Cirrhosis        Endo/Other    Renal/GU Renal disease     Musculoskeletal   Abdominal   Peds  Hematology   Anesthesia Other Findings   Reproductive/Obstetrics                              Anesthesia Physical Anesthesia Plan  ASA: 3  Anesthesia Plan: General   Post-op Pain Management:    Induction: Intravenous  PONV Risk Score and Plan: 2  Airway Management Planned: Oral ETT  Additional Equipment:   Intra-op Plan:   Post-operative Plan: Extubation in OR  Informed Consent: I have reviewed the patients History and Physical, chart, labs and discussed the procedure including the risks, benefits and alternatives for the proposed anesthesia with the patient or authorized representative who has indicated his/her understanding and acceptance.     Dental advisory given  Plan Discussed with: CRNA  Anesthesia Plan Comments: (PAT note written 03/17/2024 by Allison Zelenak, PA-C.  )         Anesthesia Quick Evaluation

## 2024-03-18 ENCOUNTER — Encounter (HOSPITAL_COMMUNITY): Payer: Self-pay | Admitting: *Deleted

## 2024-03-18 ENCOUNTER — Other Ambulatory Visit: Payer: Self-pay

## 2024-03-18 ENCOUNTER — Emergency Department (HOSPITAL_COMMUNITY)
Admission: EM | Admit: 2024-03-18 | Discharge: 2024-03-19 | Disposition: A | Discharge status: Home / Self Care | Attending: Emergency Medicine | Admitting: Emergency Medicine

## 2024-03-18 DIAGNOSIS — R1024 Suprapubic pain: Secondary | ICD-10-CM | POA: Insufficient documentation

## 2024-03-18 LAB — CBC
HCT: 41.9 % (ref 36.0–46.0)
Hemoglobin: 13.4 g/dL (ref 12.0–15.0)
MCH: 28.1 pg (ref 26.0–34.0)
MCHC: 32 g/dL (ref 30.0–36.0)
MCV: 87.8 fL (ref 80.0–100.0)
Platelets: 238 K/uL (ref 150–400)
RBC: 4.77 MIL/uL (ref 3.87–5.11)
RDW: 13.5 % (ref 11.5–15.5)
WBC: 6.5 K/uL (ref 4.0–10.5)
nRBC: 0 % (ref 0.0–0.2)

## 2024-03-18 NOTE — ED Triage Notes (Signed)
 The pt saw something hanging from her vaginal area earlier tonight

## 2024-03-18 NOTE — ED Triage Notes (Signed)
 Pt arrived from home via POV c/o lower abd pain/pressure. Pt believes that it is her bladder causing the discomfort. 2/10 pain. Pt denies dysuria.

## 2024-03-19 ENCOUNTER — Emergency Department (HOSPITAL_COMMUNITY)

## 2024-03-19 DIAGNOSIS — R1024 Suprapubic pain: Secondary | ICD-10-CM | POA: Diagnosis not present

## 2024-03-19 LAB — LIPASE, BLOOD: Lipase: 43 U/L (ref 11–51)

## 2024-03-19 LAB — COMPREHENSIVE METABOLIC PANEL WITH GFR
ALT: 15 U/L (ref 0–44)
AST: 16 U/L (ref 15–41)
Albumin: 3.5 g/dL (ref 3.5–5.0)
Alkaline Phosphatase: 70 U/L (ref 38–126)
Anion gap: 9 (ref 5–15)
BUN: 15 mg/dL (ref 8–23)
CO2: 26 mmol/L (ref 22–32)
Calcium: 9.2 mg/dL (ref 8.9–10.3)
Chloride: 107 mmol/L (ref 98–111)
Creatinine, Ser: 0.81 mg/dL (ref 0.44–1.00)
GFR, Estimated: >60 mL/min (ref >60)
Glucose, Bld: 113 mg/dL — ABNORMAL HIGH (ref 70–99)
Potassium: 4.2 mmol/L (ref 3.5–5.1)
Sodium: 142 mmol/L (ref 135–145)
Total Bilirubin: 0.9 mg/dL (ref 0.0–1.2)
Total Protein: 6.6 g/dL (ref 6.5–8.1)

## 2024-03-19 LAB — URINALYSIS, ROUTINE W REFLEX MICROSCOPIC
Bilirubin Urine: NEGATIVE
Glucose, UA: NEGATIVE mg/dL
Hgb urine dipstick: NEGATIVE
Ketones, ur: NEGATIVE mg/dL
Leukocytes,Ua: NEGATIVE
Nitrite: NEGATIVE
Protein, ur: NEGATIVE mg/dL
Specific Gravity, Urine: 1.029 (ref 1.005–1.030)
pH: 5 (ref 5.0–8.0)

## 2024-03-19 NOTE — ED Provider Notes (Signed)
 [place] EMERGENCY DEPARTMENT AT [facility] Provider Note   CSN: [id number] Arrival date & time: 03/18/24  2122     Patient presents with: Abdominal Pain   Shannon Navarro is a 68 y.o. female.   69 year old female with prior medical history as detailed below presents for evaluation.  Patient reports suprapubic discomfort.  She reports that she may have a bladder prolapse.  Yesterday evening she was using the bathroom.  She urinated.  She then felt something come out of my vagina.  She was able to reduce this with gentle pressure on her finger.  She is concerned that she may have a bladder Pleur-Evac.  She is scheduled for back surgery on Monday.  She is concerned that a bladder prolapse could interfere with the surgery.  She denies fever.  She denies nausea or vomiting.  She appears comfortable at time of exam.  The history is provided by the patient and medical records.       Prior to Admission medications   Medication Sig Start Date End Date Taking? Authorizing Provider  acetaminophen  (TYLENOL ) 500 MG tablet Take 1,000 mg by mouth every 6 (six) hours as needed for moderate pain (pain score 4-6).    [provider]  amLODipine  (NORVASC ) 2.5 MG tablet Take 1 tablet (2.5 mg total) by mouth daily. 07/16/23   Rollene Almarie LABOR, MD  ascorbic acid (VITAMIN C) 500 MG tablet Take 500 mg by mouth daily.    [provider]  fluticasone  (FLONASE ) 50 MCG/ACT nasal spray Place 2 sprays into both nostrils daily. Patient taking differently: Place 2 sprays into both nostrils daily as needed for allergies. 07/10/23   Rollene Almarie LABOR, MD  gabapentin (NEURONTIN) 100 MG capsule Take 1 capsule (100 mg total) by mouth 3 (three) times daily. 02/29/24   Norleen Lynwood ORN, MD  gentamicin  (GARAMYCIN ) 0.3 % ophthalmic solution Starting AFTER surgery, instill 1 drop in left eye 4 times a day for 1 week then stop. Patient not taking: Reported on 03/14/2024 02/02/24      MAGNESIUM PO Take 240 mg by mouth daily.    [provider]  metoprolol  tartrate (LOPRESSOR ) 25 MG tablet Take 0.5 tablets (12.5 mg total) by mouth daily. Patient taking differently: Take 12.5 mg by mouth daily as needed (heart rate over 120). 12/29/22   Lavona Lynwood, MD  omeprazole  (PRILOSEC) 20 MG capsule Take 1 capsule (20 mg total) by mouth daily. Patient taking differently: Take 20 mg by mouth daily as needed (acid reflux). 10/09/23   Beather Delon Gibson, PA  prednisoLONE  acetate (PRED FORTE ) 1 % ophthalmic suspension STARTING AFTER SURGERY Instill 1 drop in left eye 4 times a day for 7 days, then 1 drop 3x daily for 7 days, then 1 drop twice a day for 7 days, then 1 drop in left eye daily for 7 days 02/02/24     traMADol  (ULTRAM ) 50 MG tablet Take 1 tablet (50 mg total) by mouth every 6 (six) hours as needed. Patient not taking: Reported on 03/14/2024 02/29/24   Norleen Lynwood ORN, MD  VITAMIN D  PO Take 1,000 Units by mouth in the morning.    [provider]  zinc gluconate 50 MG tablet Take 50 mg by mouth daily.    [provider]    Allergies: Cefaclor, Cipro  [ciprofloxacin  hcl], Codeine, Hydrocodone , Levaquin [levofloxacin], Nexium [esomeprazole magnesium], Penicillins, and Sulfa antibiotics    Review of Systems  All other systems reviewed and are negative.  Updated Vital Signs BP (!) 124/58 (BP Location: Right Arm)   Pulse (!) 45   Temp 97.7 F (36.5 C) (Oral)   Resp 17   Ht 5' 5 (1.651 m)   Wt 91.8 kg   SpO2 100%   BMI 33.68 kg/m   Physical Exam Vitals and nursing note reviewed.  Constitutional:      General: She is not in acute distress.    Appearance: She is well-developed.  HENT:     Head: Normocephalic and atraumatic.  Eyes:     Conjunctiva/sclera: Conjunctivae normal.  Cardiovascular:     Rate and Rhythm: Normal rate and regular rhythm.     Heart sounds: No murmur heard. Pulmonary:     Effort: Pulmonary effort is normal. No  respiratory distress.     Breath sounds: Normal breath sounds.  Abdominal:     Palpations: Abdomen is soft.     Tenderness: There is no abdominal tenderness.  Genitourinary:    Comments: Patient declines pelvic exam. Musculoskeletal:        General: No swelling.     Cervical back: Neck supple.  Skin:    General: Skin is warm and dry.     Capillary Refill: Capillary refill takes less than 2 seconds.  Neurological:     Mental Status: She is alert.  Psychiatric:        Mood and Affect: Mood normal.     (all labs ordered are listed, but only abnormal results are displayed) Labs Reviewed  COMPREHENSIVE METABOLIC PANEL WITH GFR - Abnormal; Notable for the following components:      Result Value   Glucose, Bld 113 (*)    All other components within normal limits  URINALYSIS, ROUTINE W REFLEX MICROSCOPIC - Abnormal; Notable for the following components:   Color, Urine AMBER (*)    APPearance TURBID (*)    Bacteria, UA RARE (*)    All other components within normal limits  LIPASE, BLOOD  CBC    EKG: None  Radiology: CT ABDOMEN PELVIS WO CONTRAST Result Date: 03/19/2024 EXAM: CT ABDOMEN AND PELVIS WITHOUT CONTRAST 03/19/2024 07:49:57 AM TECHNIQUE: CT of the abdomen and pelvis was performed without the administration of intravenous contrast. Multiplanar reformatted images are provided for review. Automated exposure control, iterative reconstruction, and/or weight-based adjustment of the mA/kV was utilized to reduce the radiation dose to as low as reasonably achievable. COMPARISON: 10/07/2012 CLINICAL HISTORY: Abdominal/flank pain, stone suspected. FINDINGS: LOWER CHEST: No acute abnormality. LIVER: The liver is unremarkable. GALLBLADDER AND BILE DUCTS: Status post cholecystectomy. Mild fusiform dilatation of the common bile duct measures up to 8 mm. SPLEEN: The spleen is within normal limits in size and appearance. PANCREAS: The pancreas is normal in size and contour without focal lesion  or ductal dilatation. ADRENAL GLANDS: Normal size and morphology bilaterally. No nodule, thickening, or hemorrhage. No periadrenal stranding. KIDNEYS, URETERS AND BLADDER: No stones in the kidneys or ureters. No hydronephrosis. No perinephric or periureteral stranding. Urinary bladder is unremarkable. GI AND BOWEL: There is a moderate-sized hiatal hernia identified. The appendix is visualized and is normal in caliber, without wall thickening, periappendiceal inflammation, or fluid. Moderate retained stool identified within the colon. There is no bowel obstruction. PERITONEUM AND RETROPERITONEUM: No ascites. No free air. VASCULATURE: Aorta is normal in caliber. LYMPH NODES: No lymphadenopathy. REPRODUCTIVE ORGANS: Status post hysterectomy. No adnexal mass. BONES AND SOFT TISSUES: Small fat-containing umbilical hernia. No acute osseous abnormality. IMPRESSION: 1. No acute findings in the abdomen or pelvis. Electronically  signed by: Waddell Calk MD 03/19/2024 08:08 AM EST RP Workstation: [id number]     Procedures   Medications Ordered in the ED - No data to display                                  Medical Decision Making Patient is presenting with complaint of concern over possible bladder prolapse.  Interestingly, patient is not interested in pelvic exam today.  She does have a scheduled back operation for Monday.  She is concerned that her symptoms today could interfere with her surgery.  Obtained workup is without acute abnormality.  Patient reassured by this.  Patient understands need for close outpatient follow-up.  She has a urologist.  She plans on following up with her urologist for further evaluation and treatment of possible bladder prolapse.  Amount and/or Complexity of Data Reviewed Radiology: ordered.        Final diagnoses:  Suprapubic pain    ED Discharge Orders     None          Laurice Maude BROCKS, MD 03/19/24 [phone]

## 2024-03-19 NOTE — Discharge Instructions (Addendum)
 Return for any problem.  ?

## 2024-03-21 ENCOUNTER — Ambulatory Visit (HOSPITAL_COMMUNITY): Payer: Self-pay | Admitting: Vascular Surgery

## 2024-03-21 ENCOUNTER — Encounter (HOSPITAL_COMMUNITY): Admission: RE | Disposition: A | Payer: Self-pay | Source: Ambulatory Visit | Attending: Neurosurgery

## 2024-03-21 ENCOUNTER — Ambulatory Visit (HOSPITAL_COMMUNITY): Admitting: Anesthesiology

## 2024-03-21 ENCOUNTER — Ambulatory Visit (HOSPITAL_COMMUNITY)

## 2024-03-21 ENCOUNTER — Other Ambulatory Visit (HOSPITAL_COMMUNITY): Payer: Self-pay

## 2024-03-21 ENCOUNTER — Other Ambulatory Visit: Payer: Self-pay

## 2024-03-21 ENCOUNTER — Encounter (HOSPITAL_COMMUNITY): Payer: Self-pay | Admitting: Neurosurgery

## 2024-03-21 ENCOUNTER — Observation Stay (HOSPITAL_COMMUNITY)
Admission: RE | Admit: 2024-03-21 | Discharge: 2024-03-22 | Disposition: A | Discharge status: Home / Self Care | Source: Ambulatory Visit | Attending: Neurosurgery | Admitting: Neurosurgery

## 2024-03-21 DIAGNOSIS — M4316 Spondylolisthesis, lumbar region: Secondary | ICD-10-CM

## 2024-03-21 DIAGNOSIS — M48061 Spinal stenosis, lumbar region without neurogenic claudication: Secondary | ICD-10-CM | POA: Diagnosis not present

## 2024-03-21 DIAGNOSIS — M5416 Radiculopathy, lumbar region: Secondary | ICD-10-CM | POA: Insufficient documentation

## 2024-03-21 DIAGNOSIS — M4310 Spondylolisthesis, site unspecified: Principal | ICD-10-CM | POA: Diagnosis present

## 2024-03-21 DIAGNOSIS — I10 Essential (primary) hypertension: Secondary | ICD-10-CM

## 2024-03-21 DIAGNOSIS — Z79899 Other long term (current) drug therapy: Secondary | ICD-10-CM | POA: Insufficient documentation

## 2024-03-21 DIAGNOSIS — M549 Dorsalgia, unspecified: Secondary | ICD-10-CM | POA: Diagnosis present

## 2024-03-21 SURGERY — POSTERIOR LUMBAR FUSION 1 LEVEL
Anesthesia: General | Site: Back

## 2024-03-21 MED ORDER — DEXAMETHASONE SOD PHOSPHATE PF 10 MG/ML IJ SOLN
INTRAMUSCULAR | Status: DC | PRN
  Administered 2024-03-21: 10 mg via INTRAVENOUS

## 2024-03-21 MED ORDER — ZINC SULFATE 220 (50 ZN) MG PO CAPS
220.0000 mg | ORAL_CAPSULE | Freq: Every day | ORAL | Status: DC
  Filled 2024-03-21: qty 1

## 2024-03-21 MED ORDER — CHLORHEXIDINE GLUCONATE 4 % EX SOLN
1.0000 | CUTANEOUS | 1 refills | Status: AC
  Filled 2024-03-21: qty 946, 30d supply, fill #0
  Filled 2024-03-21: qty 946, 42d supply, fill #0

## 2024-03-21 MED ORDER — HYDROMORPHONE HCL 1 MG/ML IJ SOLN: 1.0000 mg | INTRAMUSCULAR | Status: DC | PRN

## 2024-03-21 MED ORDER — HYDROMORPHONE HCL 1 MG/ML IJ SOLN
INTRAMUSCULAR | Status: DC | PRN
  Administered 2024-03-21: .5 mg via INTRAVENOUS

## 2024-03-21 MED ORDER — MENTHOL 3 MG MT LOZG: 1.0000 | LOZENGE | OROMUCOSAL | Status: DC | PRN

## 2024-03-21 MED ORDER — DOCUSATE SODIUM 100 MG PO CAPS
100.0000 mg | ORAL_CAPSULE | Freq: Two times a day (BID) | ORAL | Status: DC
  Administered 2024-03-21: 100 mg via ORAL
  Filled 2024-03-21: qty 1

## 2024-03-21 MED ORDER — ONDANSETRON HCL 4 MG PO TABS
4.0000 mg | ORAL_TABLET | Freq: Four times a day (QID) | ORAL | Status: DC | PRN
  Administered 2024-03-22: 4 mg via ORAL
  Filled 2024-03-21: qty 1

## 2024-03-21 MED ORDER — THROMBIN 20000 UNITS EX SOLR
CUTANEOUS | Status: AC
Start: 2024-03-21 — End: 2024-03-21
  Filled 2024-03-21: qty 20000

## 2024-03-21 MED ORDER — BUPIVACAINE HCL (PF) 0.25 % IJ SOLN
INTRAMUSCULAR | Status: AC
  Filled 2024-03-21: qty 30

## 2024-03-21 MED ORDER — DEXMEDETOMIDINE HCL IN NACL 80 MCG/20ML IV SOLN
INTRAVENOUS | Status: DC | PRN
  Administered 2024-03-21: 8 ug via INTRAVENOUS

## 2024-03-21 MED ORDER — ACETAMINOPHEN 325 MG PO TABS: 650.0000 mg | ORAL_TABLET | ORAL | Status: DC | PRN

## 2024-03-21 MED ORDER — ORAL CARE MOUTH RINSE: 15.0000 mL | Freq: Once | OROMUCOSAL | Status: AC

## 2024-03-21 MED ORDER — 0.9 % SODIUM CHLORIDE (POUR BTL) OPTIME
TOPICAL | Status: DC | PRN
  Administered 2024-03-21: 1000 mL

## 2024-03-21 MED ORDER — FLEET ENEMA RE ENEM: 1.0000 | ENEMA | Freq: Once | RECTAL | Status: DC | PRN

## 2024-03-21 MED ORDER — DIAZEPAM 5 MG PO TABS
ORAL_TABLET | ORAL | Status: AC
  Filled 2024-03-21: qty 1

## 2024-03-21 MED ORDER — FENTANYL CITRATE (PF) 250 MCG/5ML IJ SOLN
INTRAMUSCULAR | Status: DC | PRN
  Administered 2024-03-21: 100 ug via INTRAVENOUS
  Administered 2024-03-21 (×2): 50 ug via INTRAVENOUS

## 2024-03-21 MED ORDER — HYDROCODONE-ACETAMINOPHEN 10-325 MG PO TABS
1.0000 | ORAL_TABLET | ORAL | Status: DC | PRN
  Administered 2024-03-21 – 2024-03-22 (×2): 1 via ORAL
  Filled 2024-03-21 (×4): qty 1

## 2024-03-21 MED ORDER — ALBUMIN HUMAN 5 % IV SOLN
12.5000 g | Freq: Once | INTRAVENOUS | Status: AC
  Administered 2024-03-21: 12.5 g via INTRAVENOUS

## 2024-03-21 MED ORDER — ACETAMINOPHEN 10 MG/ML IV SOLN
1000.0000 mg | Freq: Once | INTRAVENOUS | Status: DC | PRN
  Administered 2024-03-21: 1000 mg via INTRAVENOUS

## 2024-03-21 MED ORDER — FENTANYL CITRATE (PF) 100 MCG/2ML IJ SOLN
INTRAMUSCULAR | Status: AC
  Filled 2024-03-21: qty 2

## 2024-03-21 MED ORDER — CEFAZOLIN SODIUM-DEXTROSE 2-4 GM/100ML-% IV SOLN
2.0000 g | INTRAVENOUS | Status: AC
  Administered 2024-03-21: 2 g via INTRAVENOUS
  Filled 2024-03-21: qty 100

## 2024-03-21 MED ORDER — VITAMIN D 25 MCG (1000 UNIT) PO TABS: 1000.0000 [IU] | ORAL_TABLET | Freq: Every morning | ORAL | Status: DC

## 2024-03-21 MED ORDER — LIDOCAINE 2% (20 MG/ML) 5 ML SYRINGE
INTRAMUSCULAR | Status: AC
  Filled 2024-03-21: qty 20

## 2024-03-21 MED ORDER — CHLORHEXIDINE GLUCONATE CLOTH 2 % EX PADS: 6.0000 | MEDICATED_PAD | Freq: Once | CUTANEOUS | Status: DC

## 2024-03-21 MED ORDER — SODIUM CHLORIDE 0.9% FLUSH: 3.0000 mL | Freq: Two times a day (BID) | INTRAVENOUS | Status: DC

## 2024-03-21 MED ORDER — DIAZEPAM 5 MG PO TABS
5.0000 mg | ORAL_TABLET | Freq: Four times a day (QID) | ORAL | Status: DC | PRN
  Administered 2024-03-21: 5 mg via ORAL
  Filled 2024-03-21 (×2): qty 1

## 2024-03-21 MED ORDER — GLYCOPYRROLATE PF 0.2 MG/ML IJ SOSY
PREFILLED_SYRINGE | INTRAMUSCULAR | Status: DC | PRN
  Administered 2024-03-21: .2 mg via INTRAVENOUS

## 2024-03-21 MED ORDER — POLYETHYLENE GLYCOL 3350 17 G PO PACK: 17.0000 g | PACK | Freq: Every day | ORAL | Status: DC | PRN

## 2024-03-21 MED ORDER — ROCURONIUM BROMIDE 10 MG/ML (PF) SYRINGE
PREFILLED_SYRINGE | INTRAVENOUS | Status: AC
  Filled 2024-03-21: qty 40

## 2024-03-21 MED ORDER — HYDROCODONE-ACETAMINOPHEN 10-325 MG PO TABS
2.0000 | ORAL_TABLET | ORAL | Status: DC | PRN
Start: 2024-03-21 — End: 2024-03-22

## 2024-03-21 MED ORDER — CEFAZOLIN SODIUM-DEXTROSE 1-4 GM/50ML-% IV SOLN
1.0000 g | Freq: Three times a day (TID) | INTRAVENOUS | Status: AC
  Administered 2024-03-21 – 2024-03-22 (×2): 1 g via INTRAVENOUS
  Filled 2024-03-21 (×2): qty 50

## 2024-03-21 MED ORDER — SODIUM CHLORIDE 0.9% FLUSH: 3.0000 mL | INTRAVENOUS | Status: DC | PRN

## 2024-03-21 MED ORDER — ONDANSETRON HCL 4 MG/2ML IJ SOLN
INTRAMUSCULAR | Status: AC
  Filled 2024-03-21: qty 8

## 2024-03-21 MED ORDER — SUGAMMADEX SODIUM 200 MG/2ML IV SOLN
INTRAVENOUS | Status: DC | PRN
  Administered 2024-03-21: 200 mg via INTRAVENOUS

## 2024-03-21 MED ORDER — PROPOFOL 10 MG/ML IV BOLUS
INTRAVENOUS | Status: DC | PRN
  Administered 2024-03-21: 120 mg via INTRAVENOUS
  Administered 2024-03-21: 50 ug/kg/min via INTRAVENOUS

## 2024-03-21 MED ORDER — MUPIROCIN 2 % EX OINT
1.0000 | TOPICAL_OINTMENT | Freq: Two times a day (BID) | CUTANEOUS | 0 refills | Status: AC
  Filled 2024-03-21: qty 22, 30d supply, fill #0

## 2024-03-21 MED ORDER — ONDANSETRON HCL 4 MG/2ML IJ SOLN: 4.0000 mg | Freq: Once | INTRAMUSCULAR | Status: DC | PRN

## 2024-03-21 MED ORDER — METOPROLOL TARTRATE 12.5 MG HALF TABLET: 12.5000 mg | ORAL_TABLET | Freq: Every day | ORAL | Status: DC | PRN

## 2024-03-21 MED ORDER — ZINC GLUCONATE 50 MG PO TABS: 50.0000 mg | ORAL_TABLET | Freq: Every day | ORAL | Status: DC

## 2024-03-21 MED ORDER — PHENYLEPHRINE 80 MCG/ML (10ML) SYRINGE FOR IV PUSH (FOR BLOOD PRESSURE SUPPORT)
PREFILLED_SYRINGE | INTRAVENOUS | Status: AC
  Filled 2024-03-21: qty 40

## 2024-03-21 MED ORDER — PROPOFOL 10 MG/ML IV BOLUS
INTRAVENOUS | Status: AC
  Filled 2024-03-21: qty 20

## 2024-03-21 MED ORDER — SENNA 8.6 MG PO TABS
1.0000 | ORAL_TABLET | Freq: Two times a day (BID) | ORAL | Status: DC | PRN
  Filled 2024-03-21: qty 1

## 2024-03-21 MED ORDER — MIDAZOLAM HCL 2 MG/2ML IJ SOLN
INTRAMUSCULAR | Status: AC
  Filled 2024-03-21: qty 2

## 2024-03-21 MED ORDER — MIDAZOLAM HCL (PF) 2 MG/2ML IJ SOLN
INTRAMUSCULAR | Status: DC | PRN
  Administered 2024-03-21: 2 mg via INTRAVENOUS

## 2024-03-21 MED ORDER — HYDROMORPHONE HCL 1 MG/ML IJ SOLN
INTRAMUSCULAR | Status: AC
  Filled 2024-03-21: qty 0.5

## 2024-03-21 MED ORDER — OXYCODONE HCL 5 MG/5ML PO SOLN: 5.0000 mg | Freq: Once | ORAL | Status: AC | PRN

## 2024-03-21 MED ORDER — GABAPENTIN 100 MG PO CAPS
100.0000 mg | ORAL_CAPSULE | Freq: Three times a day (TID) | ORAL | Status: DC
  Administered 2024-03-21: 100 mg via ORAL
  Filled 2024-03-21: qty 1

## 2024-03-21 MED ORDER — CHLORHEXIDINE GLUCONATE 0.12 % MT SOLN
15.0000 mL | Freq: Once | OROMUCOSAL | Status: AC
  Administered 2024-03-21: 15 mL via OROMUCOSAL
  Filled 2024-03-21: qty 15

## 2024-03-21 MED ORDER — LIDOCAINE 2% (20 MG/ML) 5 ML SYRINGE
INTRAMUSCULAR | Status: DC | PRN
  Administered 2024-03-21: 80 mg via INTRAVENOUS

## 2024-03-21 MED ORDER — FENTANYL CITRATE (PF) 250 MCG/5ML IJ SOLN
INTRAMUSCULAR | Status: AC
  Filled 2024-03-21: qty 5

## 2024-03-21 MED ORDER — FENTANYL CITRATE (PF) 100 MCG/2ML IJ SOLN
25.0000 ug | INTRAMUSCULAR | Status: DC | PRN
  Administered 2024-03-21: 25 ug via INTRAVENOUS
  Administered 2024-03-21: 50 ug via INTRAVENOUS
  Administered 2024-03-21: 25 ug via INTRAVENOUS

## 2024-03-21 MED ORDER — PHENOL 1.4 % MT LIQD: 1.0000 | OROMUCOSAL | Status: DC | PRN

## 2024-03-21 MED ORDER — ONDANSETRON HCL 4 MG/2ML IJ SOLN
INTRAMUSCULAR | Status: DC | PRN
  Administered 2024-03-21: 4 mg via INTRAVENOUS

## 2024-03-21 MED ORDER — ONDANSETRON HCL 4 MG/2ML IJ SOLN: 4.0000 mg | Freq: Four times a day (QID) | INTRAMUSCULAR | Status: DC | PRN

## 2024-03-21 MED ORDER — THROMBIN 20000 UNITS EX SOLR
CUTANEOUS | Status: DC | PRN
  Administered 2024-03-21: 20 mL via TOPICAL

## 2024-03-21 MED ORDER — FLUTICASONE PROPIONATE 50 MCG/ACT NA SUSP: 2.0000 | Freq: Every day | NASAL | Status: DC | PRN

## 2024-03-21 MED ORDER — VANCOMYCIN HCL 1000 MG IV SOLR
INTRAVENOUS | Status: AC
  Filled 2024-03-21: qty 20

## 2024-03-21 MED ORDER — SODIUM CHLORIDE 0.9 % IV SOLN: 250.0000 mL | INTRAVENOUS | Status: DC

## 2024-03-21 MED ORDER — VANCOMYCIN HCL 1000 MG IV SOLR
INTRAVENOUS | Status: DC | PRN
  Administered 2024-03-21: 1000 mg via TOPICAL

## 2024-03-21 MED ORDER — OXYCODONE HCL 5 MG PO TABS
ORAL_TABLET | ORAL | Status: AC
  Filled 2024-03-21: qty 1

## 2024-03-21 MED ORDER — PANTOPRAZOLE SODIUM 40 MG PO TBEC
40.0000 mg | DELAYED_RELEASE_TABLET | Freq: Every day | ORAL | Status: DC
  Administered 2024-03-21 – 2024-03-22 (×2): 40 mg via ORAL
  Filled 2024-03-21 (×2): qty 1

## 2024-03-21 MED ORDER — ACETAMINOPHEN 10 MG/ML IV SOLN
INTRAVENOUS | Status: AC
  Filled 2024-03-21: qty 100

## 2024-03-21 MED ORDER — LACTATED RINGERS IV SOLN: INTRAVENOUS | Status: DC

## 2024-03-21 MED ORDER — OXYCODONE HCL 5 MG PO TABS
5.0000 mg | ORAL_TABLET | Freq: Once | ORAL | Status: AC | PRN
  Administered 2024-03-21: 5 mg via ORAL

## 2024-03-21 MED ORDER — BISACODYL 10 MG RE SUPP: 10.0000 mg | Freq: Every day | RECTAL | Status: DC | PRN

## 2024-03-21 MED ORDER — EPHEDRINE SULFATE-NACL 50-0.9 MG/10ML-% IV SOSY
PREFILLED_SYRINGE | INTRAVENOUS | Status: DC | PRN
  Administered 2024-03-21: 10 mg via INTRAVENOUS

## 2024-03-21 MED ORDER — ROCURONIUM BROMIDE 10 MG/ML (PF) SYRINGE
PREFILLED_SYRINGE | INTRAVENOUS | Status: DC | PRN
Start: 2024-03-21 — End: 2024-03-21
  Administered 2024-03-21: 10 mg via INTRAVENOUS
  Administered 2024-03-21: 90 mg via INTRAVENOUS
  Administered 2024-03-21: 10 mg via INTRAVENOUS

## 2024-03-21 MED ORDER — ALBUMIN HUMAN 5 % IV SOLN
INTRAVENOUS | Status: AC
Start: 2024-03-21 — End: 2024-03-21
  Filled 2024-03-21: qty 250

## 2024-03-21 MED ORDER — ACETAMINOPHEN 650 MG RE SUPP: 650.0000 mg | RECTAL | Status: DC | PRN

## 2024-03-21 MED ORDER — AMLODIPINE BESYLATE 5 MG PO TABS: 2.5000 mg | ORAL_TABLET | Freq: Every day | ORAL | Status: DC

## 2024-03-21 MED ORDER — VITAMIN C 500 MG PO TABS: 500.0000 mg | ORAL_TABLET | Freq: Every day | ORAL | Status: DC

## 2024-03-21 MED ORDER — BUPIVACAINE HCL (PF) 0.25 % IJ SOLN
INTRAMUSCULAR | Status: DC | PRN
  Administered 2024-03-21: 30 mL

## 2024-03-21 SURGICAL SUPPLY — 47 items
BAG COUNTER SPONGE SURGICOUNT (BAG) ×1 IMPLANT
BENZOIN TINCTURE PRP APPL 2/3 (GAUZE/BANDAGES/DRESSINGS) ×1 IMPLANT
BLADE BONE MILL MEDIUM (MISCELLANEOUS) ×1 IMPLANT
BLADE CLIPPER SURG (BLADE) IMPLANT
BUR MATCHSTICK NEURO 3.0 LAGG (BURR) ×1 IMPLANT
CANISTER SUCTION 3000ML PPV (SUCTIONS) ×1 IMPLANT
CNTNR URN SCR LID CUP LEK RST (MISCELLANEOUS) ×1 IMPLANT
COVER BACK TABLE 60X90IN (DRAPES) ×1 IMPLANT
DERMABOND ADVANCED .7 DNX12 (GAUZE/BANDAGES/DRESSINGS) ×1 IMPLANT
DRAPE C-ARM 42X72 X-RAY (DRAPES) ×2 IMPLANT
DRAPE HALF SHEET 40X57 (DRAPES) IMPLANT
DRAPE LAPAROTOMY 100X72X124 (DRAPES) ×1 IMPLANT
DRAPE SURG 17X23 STRL (DRAPES) ×4 IMPLANT
DRSG OPSITE POSTOP 4X6 (GAUZE/BANDAGES/DRESSINGS) ×1 IMPLANT
DURAPREP 26ML APPLICATOR (WOUND CARE) ×1 IMPLANT
ELECTRODE REM PT RTRN 9FT ADLT (ELECTROSURGICAL) ×1 IMPLANT
EVACUATOR 1/8 PVC DRAIN (DRAIN) IMPLANT
GAUZE 4X4 16PLY ~~LOC~~+RFID DBL (SPONGE) IMPLANT
GAUZE SPONGE 4X4 12PLY STRL (GAUZE/BANDAGES/DRESSINGS) IMPLANT
GLOVE BIO SURGEON STRL SZ 6.5 (GLOVE) ×1 IMPLANT
GLOVE BIOGEL PI IND STRL 6.5 (GLOVE) ×1 IMPLANT
GLOVE ECLIPSE 9.0 STRL (GLOVE) ×2 IMPLANT
GOWN STRL REUS W/ TWL LRG LVL3 (GOWN DISPOSABLE) IMPLANT
GOWN STRL REUS W/ TWL XL LVL3 (GOWN DISPOSABLE) ×2 IMPLANT
GOWN STRL REUS W/TWL 2XL LVL3 (GOWN DISPOSABLE) IMPLANT
KIT BASIN OR (CUSTOM PROCEDURE TRAY) ×1 IMPLANT
KIT TURNOVER KIT B (KITS) ×1 IMPLANT
NDL HYPO 22X1.5 SAFETY MO (MISCELLANEOUS) ×1 IMPLANT
NEEDLE HYPO 22X1.5 SAFETY MO (MISCELLANEOUS) ×1 IMPLANT
PACK LAMINECTOMY NEURO (CUSTOM PROCEDURE TRAY) ×1 IMPLANT
PENCIL BUTTON HOLSTER BLD 10FT (ELECTRODE) ×1 IMPLANT
PUTTY GRAFTON DBF 6CC W/DELIVE (Putty) IMPLANT
ROD PREBENT 30MM LUMBAR (Rod) IMPLANT
SCREW SOLERA 45X6.5XMA NS SPNE (Screw) IMPLANT
SET SCREW SPINE 5.5X6 TI (Screw) IMPLANT
SOLN 0.9% NACL POUR BTL 1000ML (IV SOLUTION) ×1 IMPLANT
SOLN STERILE WATER BTL 1000 ML (IV SOLUTION) ×1 IMPLANT
SPACER PL CATALYFT LONG 11 (Spacer) IMPLANT
SPIKE FLUID TRANSFER (MISCELLANEOUS) ×1 IMPLANT
SPONGE SURGIFOAM ABS GEL 100 (HEMOSTASIS) ×1 IMPLANT
STRIP CLOSURE SKIN 1/2X4 (GAUZE/BANDAGES/DRESSINGS) ×2 IMPLANT
SUT VIC AB 0 CT1 18XCR BRD8 (SUTURE) ×2 IMPLANT
SUT VIC AB 2-0 CT1 18 (SUTURE) ×1 IMPLANT
SUT VIC AB 3-0 SH 8-18 (SUTURE) ×2 IMPLANT
TOWEL GREEN STERILE (TOWEL DISPOSABLE) ×1 IMPLANT
TOWEL GREEN STERILE FF (TOWEL DISPOSABLE) ×1 IMPLANT
TRAY FOLEY MTR SLVR 16FR STAT (SET/KITS/TRAYS/PACK) ×1 IMPLANT

## 2024-03-21 NOTE — Op Note (Signed)
 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  L4-5 posterior lumbar interbody fusion utilizing interbody cage, locally harvested autograft, and morselized allograft.  L4-5 posterior right arthrodesis utilizing nonsegmental pedicle screw fixation and local autograft  Surgeon:Bannie Lobban A.Maebel Marasco, M.D.  Asst. Surgeon: Jennetta, NP  Anesthesia: General  Indication: 68 year old female with back and right lower extremity pain worsened with standing and walking.  Patient has failed conservative management.  Workup demonstrates evidence of an unstable degenerative spondylolisthesis with lateral recess and foraminal stenosis right greater than left with predominantly right lower extremity pain.  Patient presents now for right-sided L4-5 decompression and fusion in hopes improving her symptoms.  Operative note: After induction of anesthesia, patient should prone on the Wilson frame and appropriately padded.  Lumbar region prepped and draped sterilely.  Incision made overlying L4-5.  Dissection performed bilaterally.  Retractor placed.  Fluoroscopy used.  Levels confirmed.  Decompressive laminotomy and facetectomy was then performed in the right side using Leksell rongeurs, Kerrison rongeurs and high-speed drill to remove the inferior two thirds of the lamina of L4, the entire inferior facet and pars interarticularis of L4 was resected on the right.  The superior articular process of L5 was resected on the right and the superior aspect of the right sided L5 lamina was resected.  Ligament flavum elevated and resected.  Foraminotomies complete on the course the exiting L4 and L5 nerve roots.  Right sided L4-5 discectomy was then  performed.  The space then prepared for interbody fusion.  The space cleaned using various instruments and curettes.  Morselized autograft packed in the interspace.  A an 11 mm Medtronic expandable cage was then impacted into place and expanded.  This was positioned across midline.  Good reduction of her deformity was achieved with distraction of the cage.  Pedicles of L4 and L5 were identified using surface landmarks and intraoperative fluoroscopy superficial bone overlying the pedicle was then removed with high-speed drill.  Pedicle was then probed using a pedicle awl.  Each pedicle tract was probed and found to be solidly within the bone.  6.5 mm Medtronic Solera screws were placed bilaterally at L4 and L5.  Final images reveal good position of the cages and the hardware at the proper level with normal alignment of the spine.  Transverse processes of L4 and L5 were decorticated as were the lamina and facet joint of L4-5 on the left.  Morselized autograft and demineralized bone fibers were placed over the decorticated bone.  The cage was packed with demineralized bone fibers as well.  Gelfoam was placed over the laminotomy defect.  Short segment titanium rod placed through the screw heads at L4 and L5.  Locking caps placed over the screws.  Locking caps then engaged.  Vancomycin  powder placed the deep wound space.  Wound was then closed in layers with Vicryl sutures.  Steri-Strips and sterile dressing were applied.  No apparent complications.  Patient tolerated the procedure well and she returns to the recovery room postop.

## 2024-03-21 NOTE — Transfer of Care (Signed)
 Immediate Anesthesia Transfer of Care Note  Patient: Shannon Navarro  Procedure(s) Performed: Posterior Lumbar Interbody Fusion - Lumbar Four-Lumbar Five - Posterior Lateral and Interbody fusion (Back)  Patient Location: PACU  Anesthesia Type:General  Level of Consciousness: awake and drowsy  Airway & Oxygen Therapy: Patient Spontanous Breathing  Post-op Assessment: Report given to RN  Post vital signs: Reviewed and stable  Last Vitals:  Vitals Value Taken Time  BP 84/52 03/21/24 16:15  Temp    Pulse 54 03/21/24 16:18  Resp 12 03/21/24 16:18  SpO2 95 % 03/21/24 16:18  Vitals shown include unfiled device data.  Last Pain:  Vitals:   03/21/24 1244  TempSrc:   PainSc: 2       Patients Stated Pain Goal: 1 (03/21/24 1244)  Complications: No notable events documented.

## 2024-03-21 NOTE — Progress Notes (Signed)
 Pt placed on CPAP with home mask.   03/21/24 2200  BiPAP/CPAP/SIPAP  $ Non-Invasive Ventilator  Non-Invasive Vent Initial  BiPAP/CPAP/SIPAP Pt Type Adult  BiPAP/CPAP/SIPAP Resmed  Mask Type Nasal mask (PT mask)  Dentures removed? Not applicable  Respiratory Rate 17 breaths/min  IPAP 0 cmH20  EPAP 0 cmH2O  Pressure Support 5 cmH20  PEEP 0 cmH20  FiO2 (%) 21 %  Flow Rate 0 lpm  Minute Ventilation 13.3  Leak 0  Tidal Volume (Vt) 769  Patient Home Machine No  Patient Home Mask Yes  Patient Home Tubing No  Auto Titrate No  Nasal massage performed Yes  CPAP/SIPAP surface wiped down Yes  Device Plugged into RED Power Outlet Yes

## 2024-03-21 NOTE — Anesthesia Procedure Notes (Signed)
 Procedure Name: Intubation Date/Time: 03/21/2024 2:15 PM  Performed by: Keneth Lynwood POUR, MDPre-anesthesia Checklist: Patient identified, Emergency Drugs available, Suction available and Patient being monitored Patient Re-evaluated:Patient Re-evaluated prior to induction Oxygen Delivery Method: Circle system utilized Preoxygenation: Pre-oxygenation with 100% oxygen Induction Type: IV induction Ventilation: Mask ventilation without difficulty and Oral airway inserted - appropriate to patient size Laryngoscope Size: Mac and 3 Grade View: Grade I Tube type: Oral Tube size: 7.5 mm Number of attempts: 1 Airway Equipment and Method: Stylet and Oral airway Placement Confirmation: ETT inserted through vocal cords under direct vision, positive ETCO2 and breath sounds checked- equal and bilateral Secured at: 21 cm Tube secured with: Tape Dental Injury: Teeth and Oropharynx as per pre-operative assessment

## 2024-03-21 NOTE — H&P (Signed)
 Shannon Navarro is an 68 y.o. female.   Chief Complaint: Back pain HPI: 68 year old female with chronic and progressive worsening lumbar pain with radiation to her lower extremities which is failed conservative manage her workup demonstrates evidence of an unstable degenerative spondylolisthesis at L4-5.  Patient presents now for L4-5 decompression and fusion.  Past Medical History:  Diagnosis Date   Allergic rhinitis    Allergy    Anticoagulant long-term use    XARELTO  no longer taking   Bile duct stone    Dysrhythmia    hx of atrial fib x 2 per pt   Endometrial polyp    Endometrial polyp    Fatty liver 06/23/2007   GERD    Herniated disc, 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   History of esophageal stricture    mainly due to gerd   History of exercise stress test 06/13/2015   PER DR HOCHREIN NOTE -- NO EVIDENCE ISCHEMIA   HYPERTENSION    pt denies   Impaired fasting glucose    Migraine    hx of migraines none recent   OSA on CPAP followed by dr shellia   per study 02-09-2012  severe osa w/ AHI 33.8   Paroxysmal atrial fibrillation (HCC) cardioloigst-  dr hochrein   dx 02-21-2016 at ED   Pneumonia    with COVID, double, enlarged heart   PONV (postoperative nausea and vomiting)    after surgery in 10/2021- had nausea after surgery related to preop drink   Pre-diabetes    Pseudotumor cerebri 2007   S/P cervical spinal fusion 10/24/2021   Sleep apnea    Thyroid  nodule    Vitamin D  deficiency 07/2013    Past Surgical History:  Procedure Laterality Date   ABDOMINAL HYSTERECTOMY     ANTERIOR CERVICAL DECOMPRESSION/DISCECTOMY FUSION 4 LEVELS N/A 10/24/2021   Procedure: ANTERIOR CERVICAL DECOMPRESSION FUSION CERVICAL FOUR THROUGH CERVICAL FIVE, CERVICAL FIVE THROUGH CERVICAL SIX, CERVICAL SIX THROUGH CERVICAL SEVEN WITH INSTRUMENTATION AND ALLOGRAFT;  Surgeon: Beuford Anes, MD;  Location: [facility];  Service: Orthopedics;  Laterality: N/A;   BALLOON DILATION N/A 04/11/2022   Procedure: BALLOON  DILATION;  Surgeon: Charlanne Groom, MD;  Location: [facility];  Service: Gastroenterology;  Laterality: N/A;   BIOPSY  04/11/2022   Procedure: BIOPSY;  Surgeon: Charlanne Groom, MD;  Location: [facility];  Service: Gastroenterology;;   BREAST BIOPSY Right 08/12/2019    FIBROADENOMA    CHOLECYSTECTOMY N/A 04/10/2022   Procedure: LAPAROSCOPIC CHOLECYSTECTOMY WITH INTRAOPERATIVE CHOLANGIOGRAM;  Surgeon: Stevie Herlene Righter, MD;  Location: [facility];  Service: General;  Laterality: N/A;   COLONOSCOPY     colonscopy  2007   normal   DILATATION & CURETTAGE/HYSTEROSCOPY WITH MYOSURE N/A 03/26/2017   Procedure: DILATATION & CURETTAGE/HYSTEROSCOPY WITH MYOSURE;  Surgeon: Estelle Service, MD;  Location: [facility] [place];  Service: Gynecology;  Laterality: N/A;   DILATION AND CURETTAGE OF UTERUS  2010   ERCP N/A 04/11/2022   Procedure: ENDOSCOPIC RETROGRADE CHOLANGIOPANCREATOGRAPHY (ERCP);  Surgeon: Charlanne Groom, MD;  Location: [facility];  Service: Gastroenterology;  Laterality: N/A;   ESOPHAGOGASTRODUODENOSCOPY N/A 04/11/2022   Procedure: ESOPHAGOGASTRODUODENOSCOPY (EGD);  Surgeon: Charlanne Groom, MD;  Location: [facility];  Service: Gastroenterology;  Laterality: N/A;   FRACTURE SURGERY     IRRIGATION AND DEBRIDEMENT ABSCESS N/A 03/19/2023   Procedure: WOUND EXPLORATION AND DRAIN PLACEMENT;  Surgeon: Eletha Boas, MD;  Location: [facility];  Service: General;  Laterality: N/A;   LAPAROSCOPIC HYSTERECTOMY Bilateral 06/18/2017   Procedure: HYSTERECTOMY TOTAL LAPAROSCOPIC;  Surgeon: Estelle,  Nathanel, MD;  Location[facility];  Service: Gynecology;  Laterality: Bilateral;  OUT PT IN BED, needs extra time MD request 2.5hrs in OR   MASS EXCISION Left 02/17/2023   Procedure: EXCISION SOFT TISSUE MASS LEFT BACK;  Surgeon: Eletha Boas, MD;  Location: [facility];  Service: General;  Laterality: Left;   REMOVAL OF STONES  04/11/2022   Procedure: REMOVAL OF STONES;  Surgeon: Charlanne Groom, MD;  Location: [facility];  Service: Gastroenterology;;   ANNETT  04/11/2022   Procedure: ANNETT;  Surgeon: Charlanne Groom, MD;  Location: [facility];  Service: Gastroenterology;;   SPINE SURGERY     TONSILLECTOMY Bilateral 01/10/2022   Procedure: TONSILLECTOMY;  Surgeon: Jesus Oliphant, MD;  Location: [facility];  Service: ENT;  Laterality: Bilateral;   torn meniscus surgery on right knee      TRANSTHORACIC ECHOCARDIOGRAM  03/11/2016   ef 60-65%/  mild MR/ trivial PR and TR   TUBAL LIGATION     UPPER GASTROINTESTINAL ENDOSCOPY  11/2020    Family History  Problem Relation Age of Onset   Breast cancer Mother        in 53's   Asthma Mother    Hypertension Mother    Prostate cancer Father    Aortic stenosis Father    Hypertension Father    Colon polyps Father    Heart disease Father    Diabetes Father    Breast cancer Maternal Aunt    Breast cancer Maternal Aunt    Breast cancer Cousin    Esophageal cancer Other    Stomach cancer Other    Colon cancer Other    Rectal cancer Neg Hx    Social History:  reports that she has never smoked. She has never been exposed to tobacco smoke. She has never used smokeless tobacco. She reports that she does not drink alcohol and does not use drugs.  Allergies:  Allergies  Allergen Reactions   Cefaclor Rash and Other (See Comments)   Cipro  [Ciprofloxacin  Hcl] Rash    Hands were red, skin was red    Codeine Nausea And Vomiting   Hydrocodone  Nausea And Vomiting    Severe vomiting/  COUGH SYRUP CAUSE SEVERE VOMITING   Levaquin [Levofloxacin] Nausea Only   Nexium [Esomeprazole Magnesium] Other (See Comments)    Made legs ache   Penicillins Rash   Sulfa Antibiotics Rash    Medications Prior to Admission  Medication Sig Dispense Refill   acetaminophen  (TYLENOL ) 500 MG tablet Take 1,000 mg by mouth every 6 (six) hours as needed for moderate pain (pain score 4-6).     amLODipine  (NORVASC ) 2.5 MG tablet Take 1 tablet (2.5  mg total) by mouth daily. 90 tablet 3   ascorbic acid (VITAMIN C) 500 MG tablet Take 500 mg by mouth daily.     fluticasone  (FLONASE ) 50 MCG/ACT nasal spray Place 2 sprays into both nostrils daily. (Patient taking differently: Place 2 sprays into both nostrils daily as needed for allergies.) 48 g 6   gabapentin (NEURONTIN) 100 MG capsule Take 1 capsule (100 mg total) by mouth 3 (three) times daily. 90 capsule 3   MAGNESIUM PO Take 240 mg by mouth daily.     metoprolol  tartrate (LOPRESSOR ) 25 MG tablet Take 0.5 tablets (12.5 mg total) by mouth daily. (Patient taking differently: Take 12.5 mg by mouth daily as needed (heart rate over 120).) 30 tablet 11   omeprazole  (PRILOSEC) 20 MG capsule Take 1 capsule (20 mg total) by  mouth daily. (Patient taking differently: Take 20 mg by mouth daily as needed (acid reflux).) 90 capsule 3   prednisoLONE  acetate (PRED FORTE ) 1 % ophthalmic suspension STARTING AFTER SURGERY Instill 1 drop in left eye 4 times a day for 7 days, then 1 drop 3x daily for 7 days, then 1 drop twice a day for 7 days, then 1 drop in left eye daily for 7 days 5 mL 2   VITAMIN D  PO Take 1,000 Units by mouth in the morning.     zinc gluconate 50 MG tablet Take 50 mg by mouth daily.     gentamicin  (GARAMYCIN ) 0.3 % ophthalmic solution Starting AFTER surgery, instill 1 drop in left eye 4 times a day for 1 week then stop. (Patient not taking: Reported on 03/14/2024) 5 mL 1   traMADol  (ULTRAM ) 50 MG tablet Take 1 tablet (50 mg total) by mouth every 6 (six) hours as needed. (Patient not taking: Reported on 03/14/2024) 30 tablet 0    No results found for this or any previous visit (from the past 48 hours). No results found.  Pertinent items noted in HPI and remainder of comprehensive ROS otherwise negative.  Blood pressure (!) 122/55, pulse (!) 56, temperature 98.4 F (36.9 C), temperature source Oral, resp. rate 18, height 5' 5 (1.651 m), weight 88 kg, SpO2 98%.  Patient is awake and alert.   She is oriented and appropriate.  Speech is fluent.  Judgment insight are intact.  Cranial nerve function normal bilaterally.  Motor examination reveals intact motor strength bilaterally sensory examination is some patchy distal sensory loss in both lower extremities.  Reflexes are normal active.  Straight raising equivocal bilaterally.  Examination head ears eyes nose throat is unremarked.  Chest and abdomen are benign.  Extremities are free of major deformity. Assessment/Plan Unstable L4-5 degenerative spondylolisthesis with stenosis.  Plan left L4-5 decompressive laminotomy and foraminotomies with left L4-5 posterior lumbar body fusion utilizing interbody cage, local harvested autograft, coupled with posterior lateral arthrodesis utilizing nonsegmental pedicle screw fixation and local autograft.  Risks and benefits been explained.  Patient wishes to proceed.  Caylee Vlachos A Lancer Thurner 03/21/2024, 1:16 PM

## 2024-03-21 NOTE — Brief Op Note (Signed)
 03/21/2024  3:57 PM  PATIENT:  Joen JONETTA Dimes  68 y.o. female  PRE-OPERATIVE DIAGNOSIS:  Degenerative spondylolisthesis  POST-OPERATIVE DIAGNOSIS:  Degenerative spondylolisthesis  PROCEDURE:  Procedure(s) with comments: Posterior Lumbar Interbody Fusion - Lumbar Four-Lumbar Five - Posterior Lateral and Interbody fusion (N/A) - PLIF - L4-L5 - Posterior Lateral and Interbody fusion  SURGEON:  Surgeons and Role:    DEWAINE Louis Shove, MD - Primary  PHYSICIAN ASSISTANT:   ASSISTANTSBETHA Jennetta PIETY   ANESTHESIA:   general  EBL:  100cc   BLOOD ADMINISTERED:none  DRAINS: none   LOCAL MEDICATIONS USED:  MARCAINE      SPECIMEN:  No Specimen  DISPOSITION OF SPECIMEN:  N/A  COUNTS:  YES  TOURNIQUET:  * No tourniquets in log *  DICTATION: .Dragon Dictation  PLAN OF CARE: Admit for overnight observation  PATIENT DISPOSITION:  PACU - hemodynamically stable.   Delay start of Pharmacological VTE agent (>24hrs) due to surgical blood loss or risk of bleeding: yes

## 2024-03-22 ENCOUNTER — Other Ambulatory Visit (HOSPITAL_COMMUNITY): Payer: Self-pay

## 2024-03-22 DIAGNOSIS — M4316 Spondylolisthesis, lumbar region: Secondary | ICD-10-CM | POA: Diagnosis not present

## 2024-03-22 MED ORDER — HYDROCODONE-ACETAMINOPHEN 10-325 MG PO TABS
1.0000 | ORAL_TABLET | ORAL | 0 refills | Status: AC | PRN
  Filled 2024-03-22: qty 30, 5d supply, fill #0

## 2024-03-22 MED ORDER — METHOCARBAMOL 500 MG PO TABS
500.0000 mg | ORAL_TABLET | Freq: Four times a day (QID) | ORAL | 1 refills | Status: AC | PRN
  Filled 2024-03-22: qty 40, 10d supply, fill #0

## 2024-03-22 NOTE — Progress Notes (Signed)
 Patient alert and oriented, surgical site clean and dry no sign of infection. D/c instructions explain and given to the patient all questions answered. Patient refused Hydrocodone  from Pharmacy, patient stated she will be taking extra strength tylenol , Megan Dr. Louis NP notified. Patient d/c home per order.

## 2024-03-22 NOTE — Discharge Summary (Signed)
 Physician Discharge Summary  Patient ID: Shannon Navarro MRN: [id number] DOB/AGE: 1955-09-30 68 y.o.  Admit date: 03/21/2024 Discharge date: 03/22/2024  Admission Diagnoses:  Discharge Diagnoses:  Principal Problem:   Degenerative spondylolisthesis   Discharged Condition: good  Hospital Course: Patient mid to hospital where she had an uncomplicated lumbar compression fusion.  Postoperative doing very well.  Standing ambulating voiding without difficulty.  Preoperative pain much improved.  Ready for discharge home.  Consults:   Significant Diagnostic Studies:   Treatments:   Discharge Exam: Blood pressure (!) 109/45, pulse (!) 53, temperature 98.4 F (36.9 C), temperature source Oral, resp. rate 17, height 5' 5 (1.651 m), weight 88 kg, SpO2 97%. Awake and alert.  Oriented and appropriate.  Motor and sensory function intact.  Wound clean and dry.  Chest benign.  Disposition: Discharge disposition: 01-Home or Self Care        Allergies as of 03/22/2024       Reactions   Cefaclor Rash, Other (See Comments)   Cipro  [ciprofloxacin  Hcl] Rash   Hands were red, skin was red    Codeine Nausea And Vomiting   Hydrocodone  Nausea And Vomiting   Severe vomiting/  COUGH SYRUP CAUSE SEVERE VOMITING   Levaquin [levofloxacin] Nausea Only   Nexium [esomeprazole Magnesium] Other (See Comments)   Made legs ache   Penicillins Rash   Sulfa Antibiotics Rash        Medication List     TAKE these medications    acetaminophen  500 MG tablet Commonly known as: TYLENOL  Take 1,000 mg by mouth every 6 (six) hours as needed for moderate pain (pain score 4-6).   amLODipine  2.5 MG tablet Commonly known as: NORVASC  Take 1 tablet (2.5 mg total) by mouth daily.   ascorbic acid 500 MG tablet Commonly known as: VITAMIN C Take 500 mg by mouth daily.   chlorhexidine  4 % external liquid Commonly known as: HIBICLENS  Apply 15 mLs (1 Application total) topically as directed for 30 doses.  Use as directed daily for 5 days every other week for 6 weeks.   fluticasone  50 MCG/ACT nasal spray Commonly known as: FLONASE  Place 2 sprays into both nostrils daily. What changed:  when to take this reasons to take this   gabapentin 100 MG capsule Commonly known as: NEURONTIN Take 1 capsule (100 mg total) by mouth 3 (three) times daily.   gentamicin  0.3 % ophthalmic solution Commonly known as: GARAMYCIN  Starting AFTER surgery, instill 1 drop in left eye 4 times a day for 1 week then stop.   HYDROcodone -acetaminophen  10-325 MG tablet Commonly known as: NORCO Take 1 tablet by mouth every 4 (four) hours as needed for moderate pain (pain score 4-6) ((score 4 to 6)).   MAGNESIUM PO Take 240 mg by mouth daily.   methocarbamol  500 MG tablet Commonly known as: ROBAXIN  Take 1 tablet (500 mg total) by mouth every 6 (six) hours as needed for muscle spasms.   metoprolol  tartrate 25 MG tablet Commonly known as: LOPRESSOR  Take 0.5 tablets (12.5 mg total) by mouth daily. What changed:  when to take this reasons to take this   mupirocin  ointment 2 % Commonly known as: BACTROBAN  Place 1 Application into the nose 2 (two) times daily for 60 doses. Use as directed 2 times daily for 5 days every other week for 6 weeks.   omeprazole  20 MG capsule Commonly known as: PRILOSEC Take 1 capsule (20 mg total) by mouth daily. What changed:  when to take this reasons to  take this   prednisoLONE  acetate 1 % ophthalmic suspension Commonly known as: PRED FORTE  STARTING AFTER SURGERY Instill 1 drop in left eye 4 times a day for 7 days, then 1 drop 3x daily for 7 days, then 1 drop twice a day for 7 days, then 1 drop in left eye daily for 7 days   traMADol  50 MG tablet Commonly known as: ULTRAM  Take 1 tablet (50 mg total) by mouth every 6 (six) hours as needed.   VITAMIN D  PO Take 1,000 Units by mouth in the morning.   zinc gluconate 50 MG tablet Take 50 mg by mouth daily.                Durable Medical Equipment  (From admission, onward)           Start     Ordered   03/21/24 1853  DME Walker rolling  Once       Question:  Patient needs a walker to treat with the following condition  Answer:  Degenerative spondylolisthesis   03/21/24 1852   03/21/24 1853  DME 3 n 1  Once        03/21/24 1852             Signed: [facility] Olisa Quesnel 03/22/2024, 10:17 AM

## 2024-03-22 NOTE — Anesthesia Postprocedure Evaluation (Signed)
 Anesthesia Post Note  Patient: Shannon Navarro  Procedure(s) Performed: Posterior Lumbar Interbody Fusion - Lumbar Four-Lumbar Five - Posterior Lateral and Interbody fusion (Back)     Patient location during evaluation: PACU Anesthesia Type: General Level of consciousness: awake and alert Pain management: pain level controlled Vital Signs Assessment: post-procedure vital signs reviewed and stable Respiratory status: spontaneous breathing, nonlabored ventilation, respiratory function stable and patient connected to nasal cannula oxygen Cardiovascular status: blood pressure returned to baseline and stable Postop Assessment: no apparent nausea or vomiting Anesthetic complications: no   No notable events documented.  Last Vitals:  Vitals:   03/21/24 2255 03/22/24 0445  BP: (!) 111/47 (!) 109/49  Pulse: (!) 55 (!) 53  Resp: 18 16  Temp: 36.7 C 36.9 C  SpO2: 97% 100%    Last Pain:  Vitals:   03/22/24 0553  TempSrc:   PainSc: 5                  [name]

## 2024-03-22 NOTE — Progress Notes (Signed)
 PT Cancellation Note and Discharge  Patient Details Name: Shannon Navarro MRN: [id number] DOB: 29-Jul-1955   Cancelled Treatment:    Reason Eval/Treat Not Completed: PT screened, no needs identified, will sign off. Discussed pt case with OT who reports pt is currently mobilizing independently and does not require a formal PT evaluation at this time. PT signing off. If needs change, please reconsult.     Leita JONETTA Sable 03/22/2024, 9:34 AM  Leita Sable, PT, DPT Acute Rehabilitation Services Secure Chat Preferred Office: [phone]

## 2024-03-22 NOTE — Evaluation (Signed)
 Occupational Therapy Evaluation Patient Details Name: Shannon Navarro MRN: [id number] DOB: 1955/06/04 Today's Date: 03/22/2024   History of Present Illness   Shannon Navarro is a 68 yo female who is s/p PLIF L4-5 11/10. PMHx: allergy, dysrhythmia, fatty liver, GERD, HTN, OSA on CPAP     Clinical Impressions [name] was evaluated s/p the above spine surgery. She is indep at baseline. Upon evaluation pt was limited by surgical pain, spinal precautions and decreased activity tolerance. Overall she demonstrated mod I - indep ability to complete ADLs and mobility without DME. Provided cues and education on spinal precautions and compensatory techniques throughout, handout provided and pt demonstrated great recall during ADLs and mobility. Pt does not require further acute OT services. Recommend d/c home with support of family.       If plan is discharge home, recommend the following:   Assistance with cooking/housework;Help with stairs or ramp for entrance     Functional Status Assessment   Patient has had a recent decline in their functional status and demonstrates the ability to make significant improvements in function in a reasonable and predictable amount of time.     Equipment Recommendations   None recommended by OT      Precautions/Restrictions   Precautions Precautions: Fall;Back Precaution Booklet Issued: Yes (comment) Recall of Precautions/Restrictions: Intact Required Braces or Orthoses: Spinal Brace Spinal Brace: Lumbar corset Restrictions Weight Bearing Restrictions Per Provider Order: No     Mobility Bed Mobility               General bed mobility comments: OOB on arrival    Transfers Overall transfer level: Independent                        Balance Overall balance assessment: No apparent balance deficits (not formally assessed)                                         ADL either performed or assessed with  clinical judgement   ADL Overall ADL's : Modified independent                                       General ADL Comments: mod I after review of compensatory techniques     Vision Baseline Vision/History: 1 Wears glasses Vision Assessment?: No apparent visual deficits     Perception Perception: Within Functional Limits       Praxis Praxis: WFL       Pertinent Vitals/Pain Pain Assessment Pain Assessment: Faces Faces Pain Scale: Hurts little more Pain Location: surgcial Pain Descriptors / Indicators: Discomfort, Grimacing, Guarding Pain Intervention(s): Limited activity within patient's tolerance, Monitored during session     Extremity/Trunk Assessment Upper Extremity Assessment Upper Extremity Assessment: Overall WFL for tasks assessed   Lower Extremity Assessment Lower Extremity Assessment: Overall WFL for tasks assessed   Cervical / Trunk Assessment Cervical / Trunk Assessment: Neck Surgery   Communication Communication Communication: No apparent difficulties   Cognition Arousal: Alert Behavior During Therapy: WFL for tasks assessed/performed Cognition: No apparent impairments                               Following commands: Intact       Cueing  General Comments   Cueing Techniques: Verbal cues  VSS on RA   Exercises     Shoulder Instructions      Home Living Family/patient expects to be discharged to:: Private residence Living Arrangements: Spouse/significant other Available Help at Discharge: Family;Available 24 hours/day Type of Home: House Home Access: Stairs to enter [employer] of Steps: 3   Home Layout: One level         Firefighter: Standard     Home Equipment: Shower seat;BSC/3in1          Prior Functioning/Environment Prior Level of Function : Independent/Modified Independent                    OT Problem List: Decreased activity tolerance;Decreased knowledge of  precautions   OT Treatment/Interventions:        OT Goals(Current goals can be found in the care plan section)   Acute Rehab OT Goals Patient Stated Goal: home OT Goal Formulation: With patient Time For Goal Achievement: 03/22/24 Potential to Achieve Goals: Good   OT Frequency:       Co-evaluation              AM-PAC OT 6 Clicks Daily Activity     Outcome Measure Help from another person eating meals?: None Help from another person taking care of personal grooming?: None Help from another person toileting, which includes using toliet, bedpan, or urinal?: None Help from another person bathing (including washing, rinsing, drying)?: None Help from another person to put on and taking off regular upper body clothing?: None Help from another person to put on and taking off regular lower body clothing?: None 6 Click Score: 24   End of Session Equipment Utilized During Treatment: Back brace Nurse Communication: Mobility status  Activity Tolerance: Patient tolerated treatment well Patient left: in chair  OT Visit Diagnosis: Muscle weakness (generalized) (M62.81)                Time: 9184-9162 OT Time Calculation (min): 22 min Charges:  OT General Charges $OT Visit: 1 Visit OT Evaluation $OT Eval Low Complexity: 1 Low OT Treatments $Self Care/Home Management : 8-22 mins  Lucie Kendall, OTR/L Acute Rehabilitation Services Office [phone] Secure Chat Communication Preferred   Lucie JONETTA Kendall 03/22/2024, 9:27 AM

## 2024-03-22 NOTE — Discharge Instructions (Signed)

## 2024-03-24 ENCOUNTER — Other Ambulatory Visit (HOSPITAL_BASED_OUTPATIENT_CLINIC_OR_DEPARTMENT_OTHER): Payer: Self-pay

## 2024-03-24 ENCOUNTER — Other Ambulatory Visit: Payer: Self-pay

## 2024-03-24 ENCOUNTER — Ambulatory Visit (INDEPENDENT_AMBULATORY_CARE_PROVIDER_SITE_OTHER)
Admission: RE | Admit: 2024-03-24 | Discharge: 2024-03-24 | Disposition: A | Discharge status: Home / Self Care | Source: Ambulatory Visit | Attending: Neurosurgery | Admitting: Neurosurgery

## 2024-03-24 ENCOUNTER — Other Ambulatory Visit (HOSPITAL_BASED_OUTPATIENT_CLINIC_OR_DEPARTMENT_OTHER): Payer: Self-pay | Admitting: Neurosurgery

## 2024-03-24 DIAGNOSIS — M7989 Other specified soft tissue disorders: Secondary | ICD-10-CM

## 2024-03-24 MED ORDER — METHYLPREDNISOLONE 4 MG PO TBPK
ORAL_TABLET | ORAL | 0 refills | Status: DC
  Filled 2024-03-24: qty 21, 6d supply, fill #0

## 2024-03-25 ENCOUNTER — Other Ambulatory Visit (HOSPITAL_BASED_OUTPATIENT_CLINIC_OR_DEPARTMENT_OTHER): Payer: Self-pay

## 2024-03-28 ENCOUNTER — Other Ambulatory Visit (HOSPITAL_COMMUNITY): Payer: Self-pay

## 2024-03-28 ENCOUNTER — Other Ambulatory Visit (HOSPITAL_BASED_OUTPATIENT_CLINIC_OR_DEPARTMENT_OTHER): Payer: Self-pay

## 2024-03-28 ENCOUNTER — Other Ambulatory Visit: Payer: Self-pay

## 2024-03-29 ENCOUNTER — Other Ambulatory Visit: Payer: Self-pay

## 2024-04-12 ENCOUNTER — Other Ambulatory Visit (HOSPITAL_COMMUNITY): Payer: Self-pay

## 2024-04-27 ENCOUNTER — Other Ambulatory Visit (HOSPITAL_BASED_OUTPATIENT_CLINIC_OR_DEPARTMENT_OTHER): Payer: Self-pay

## 2024-04-27 MED ORDER — FLUZONE HIGH-DOSE 0.5 ML IM SUSY
0.5000 mL | PREFILLED_SYRINGE | Freq: Once | INTRAMUSCULAR | 0 refills | Status: AC
  Filled 2024-04-27: qty 0.5, 1d supply, fill #0

## 2024-05-03 ENCOUNTER — Other Ambulatory Visit (HOSPITAL_COMMUNITY): Payer: Self-pay

## 2024-06-07 ENCOUNTER — Other Ambulatory Visit: Payer: Self-pay | Admitting: Internal Medicine

## 2024-07-13 ENCOUNTER — Encounter: Payer: Medicare Other | Admitting: Internal Medicine

## 2024-07-13 ENCOUNTER — Ambulatory Visit
